# Patient Record
Sex: Male | Born: 1955 | Race: Black or African American | Hispanic: No | Marital: Married | Smoking: Current every day smoker
Health system: Southern US, Community
[De-identification: ages and names within clinical notes are randomized; demographics above are authoritative.]

## PROBLEM LIST (undated history)

## (undated) DIAGNOSIS — IMO0001 Reserved for inherently not codable concepts without codable children: Secondary | ICD-10-CM

## (undated) DIAGNOSIS — C259 Malignant neoplasm of pancreas, unspecified: Secondary | ICD-10-CM

## (undated) DIAGNOSIS — M109 Gout, unspecified: Secondary | ICD-10-CM

## (undated) DIAGNOSIS — M1990 Unspecified osteoarthritis, unspecified site: Secondary | ICD-10-CM

## (undated) DIAGNOSIS — Z8719 Personal history of other diseases of the digestive system: Secondary | ICD-10-CM

## (undated) DIAGNOSIS — I10 Essential (primary) hypertension: Secondary | ICD-10-CM

## (undated) DIAGNOSIS — I82890 Acute embolism and thrombosis of other specified veins: Secondary | ICD-10-CM

## (undated) DIAGNOSIS — Z5189 Encounter for other specified aftercare: Secondary | ICD-10-CM

## (undated) DIAGNOSIS — K635 Polyp of colon: Secondary | ICD-10-CM

## (undated) DIAGNOSIS — E785 Hyperlipidemia, unspecified: Secondary | ICD-10-CM

## (undated) DIAGNOSIS — K7689 Other specified diseases of liver: Secondary | ICD-10-CM

## (undated) DIAGNOSIS — K219 Gastro-esophageal reflux disease without esophagitis: Secondary | ICD-10-CM

## (undated) HISTORY — DX: Encounter for other specified aftercare: Z51.89

## (undated) HISTORY — DX: Personal history of other diseases of the digestive system: Z87.19

## (undated) HISTORY — PX: SHOULDER SURGERY: SHX246

## (undated) HISTORY — DX: Essential (primary) hypertension: I10

## (undated) HISTORY — DX: Acute embolism and thrombosis of other specified veins: I82.890

## (undated) HISTORY — DX: Polyp of colon: K63.5

## (undated) HISTORY — PX: HIP SURGERY: SHX245

## (undated) HISTORY — DX: Gastro-esophageal reflux disease without esophagitis: K21.9

## (undated) HISTORY — PX: HAND SURGERY: SHX662

## (undated) HISTORY — DX: Other specified diseases of liver: K76.89

## (undated) HISTORY — DX: Hyperlipidemia, unspecified: E78.5

## (undated) HISTORY — DX: Reserved for inherently not codable concepts without codable children: IMO0001

---

## 1998-12-12 ENCOUNTER — Encounter: Admission: RE | Admit: 1998-12-12 | Discharge: 1998-12-12 | Payer: Self-pay | Admitting: Sports Medicine

## 1999-03-07 ENCOUNTER — Encounter: Admission: RE | Admit: 1999-03-07 | Discharge: 1999-03-07 | Payer: Self-pay | Admitting: Family Medicine

## 1999-09-12 ENCOUNTER — Encounter: Admission: RE | Admit: 1999-09-12 | Discharge: 1999-09-12 | Payer: Self-pay | Admitting: Family Medicine

## 1999-11-16 ENCOUNTER — Encounter: Admission: RE | Admit: 1999-11-16 | Discharge: 1999-11-16 | Payer: Self-pay | Admitting: Family Medicine

## 2000-03-14 ENCOUNTER — Encounter: Admission: RE | Admit: 2000-03-14 | Discharge: 2000-03-14 | Payer: Self-pay | Admitting: Family Medicine

## 2000-07-15 ENCOUNTER — Encounter: Admission: RE | Admit: 2000-07-15 | Discharge: 2000-07-15 | Payer: Self-pay | Admitting: Family Medicine

## 2000-09-12 ENCOUNTER — Encounter: Admission: RE | Admit: 2000-09-12 | Discharge: 2000-09-12 | Payer: Self-pay | Admitting: Family Medicine

## 2001-04-09 ENCOUNTER — Encounter: Admission: RE | Admit: 2001-04-09 | Discharge: 2001-04-09 | Payer: Self-pay | Admitting: Family Medicine

## 2001-04-23 ENCOUNTER — Encounter: Admission: RE | Admit: 2001-04-23 | Discharge: 2001-04-23 | Payer: Self-pay | Admitting: Sports Medicine

## 2001-05-07 ENCOUNTER — Encounter: Admission: RE | Admit: 2001-05-07 | Discharge: 2001-05-07 | Payer: Self-pay | Admitting: Family Medicine

## 2001-06-22 ENCOUNTER — Encounter: Admission: RE | Admit: 2001-06-22 | Discharge: 2001-06-22 | Payer: Self-pay | Admitting: Family Medicine

## 2001-09-09 ENCOUNTER — Encounter: Admission: RE | Admit: 2001-09-09 | Discharge: 2001-09-09 | Payer: Self-pay | Admitting: Family Medicine

## 2001-09-18 ENCOUNTER — Encounter: Admission: RE | Admit: 2001-09-18 | Discharge: 2001-09-18 | Payer: Self-pay | Admitting: Family Medicine

## 2001-09-28 ENCOUNTER — Encounter: Admission: RE | Admit: 2001-09-28 | Discharge: 2001-09-28 | Payer: Self-pay | Admitting: Family Medicine

## 2002-02-17 ENCOUNTER — Encounter: Admission: RE | Admit: 2002-02-17 | Discharge: 2002-02-17 | Payer: Self-pay | Admitting: Family Medicine

## 2002-05-31 ENCOUNTER — Encounter: Admission: RE | Admit: 2002-05-31 | Discharge: 2002-05-31 | Payer: Self-pay | Admitting: Family Medicine

## 2002-07-09 ENCOUNTER — Encounter: Admission: RE | Admit: 2002-07-09 | Discharge: 2002-07-09 | Payer: Self-pay | Admitting: Sports Medicine

## 2002-07-09 ENCOUNTER — Encounter: Payer: Self-pay | Admitting: Sports Medicine

## 2002-11-29 ENCOUNTER — Encounter: Admission: RE | Admit: 2002-11-29 | Discharge: 2002-11-29 | Payer: Self-pay | Admitting: Family Medicine

## 2002-12-29 ENCOUNTER — Encounter: Admission: RE | Admit: 2002-12-29 | Discharge: 2002-12-29 | Payer: Self-pay | Admitting: Family Medicine

## 2003-03-17 ENCOUNTER — Encounter: Admission: RE | Admit: 2003-03-17 | Discharge: 2003-03-17 | Payer: Self-pay | Admitting: Sports Medicine

## 2003-04-13 ENCOUNTER — Encounter: Admission: RE | Admit: 2003-04-13 | Discharge: 2003-04-13 | Payer: Self-pay | Admitting: Family Medicine

## 2003-05-06 ENCOUNTER — Encounter: Admission: RE | Admit: 2003-05-06 | Discharge: 2003-05-06 | Payer: Self-pay | Admitting: Family Medicine

## 2003-11-28 ENCOUNTER — Ambulatory Visit: Payer: Self-pay | Admitting: Family Medicine

## 2004-11-07 ENCOUNTER — Ambulatory Visit: Payer: Self-pay | Admitting: Sports Medicine

## 2004-11-14 ENCOUNTER — Encounter: Admission: RE | Admit: 2004-11-14 | Discharge: 2004-11-14 | Payer: Self-pay | Admitting: Orthopedic Surgery

## 2005-02-04 ENCOUNTER — Encounter (INDEPENDENT_AMBULATORY_CARE_PROVIDER_SITE_OTHER): Payer: Self-pay | Admitting: Family Medicine

## 2005-02-04 LAB — CONVERTED CEMR LAB
HDL: 42 mg/dL
LDL Cholesterol: 63 mg/dL

## 2005-12-30 ENCOUNTER — Ambulatory Visit: Payer: Self-pay | Admitting: Sports Medicine

## 2005-12-31 LAB — CONVERTED CEMR LAB: LDL Cholesterol: 63 mg/dL

## 2006-02-13 ENCOUNTER — Ambulatory Visit: Payer: Self-pay | Admitting: Sports Medicine

## 2006-04-03 DIAGNOSIS — I7300 Raynaud's syndrome without gangrene: Secondary | ICD-10-CM

## 2006-04-03 DIAGNOSIS — K210 Gastro-esophageal reflux disease with esophagitis: Secondary | ICD-10-CM

## 2006-04-03 DIAGNOSIS — H109 Unspecified conjunctivitis: Secondary | ICD-10-CM | POA: Insufficient documentation

## 2006-04-03 DIAGNOSIS — M109 Gout, unspecified: Secondary | ICD-10-CM | POA: Insufficient documentation

## 2006-04-03 DIAGNOSIS — R42 Dizziness and giddiness: Secondary | ICD-10-CM | POA: Insufficient documentation

## 2006-04-03 DIAGNOSIS — I10 Essential (primary) hypertension: Secondary | ICD-10-CM | POA: Insufficient documentation

## 2006-09-30 ENCOUNTER — Ambulatory Visit: Payer: Self-pay | Admitting: Family Medicine

## 2006-09-30 DIAGNOSIS — M6740 Ganglion, unspecified site: Secondary | ICD-10-CM

## 2006-09-30 DIAGNOSIS — L608 Other nail disorders: Secondary | ICD-10-CM

## 2006-12-02 ENCOUNTER — Ambulatory Visit: Payer: Self-pay | Admitting: Family Medicine

## 2006-12-26 ENCOUNTER — Encounter (INDEPENDENT_AMBULATORY_CARE_PROVIDER_SITE_OTHER): Payer: Self-pay | Admitting: *Deleted

## 2007-02-11 ENCOUNTER — Ambulatory Visit: Payer: Self-pay | Admitting: Family Medicine

## 2007-02-11 ENCOUNTER — Encounter (INDEPENDENT_AMBULATORY_CARE_PROVIDER_SITE_OTHER): Payer: Self-pay | Admitting: Family Medicine

## 2007-02-11 LAB — CONVERTED CEMR LAB
BUN: 11 mg/dL (ref 6–23)
Chloride: 103 meq/L (ref 96–112)
Potassium: 4.5 meq/L (ref 3.5–5.3)
Sodium: 141 meq/L (ref 135–145)

## 2007-02-12 ENCOUNTER — Encounter (INDEPENDENT_AMBULATORY_CARE_PROVIDER_SITE_OTHER): Payer: Self-pay | Admitting: Family Medicine

## 2007-11-09 ENCOUNTER — Encounter (INDEPENDENT_AMBULATORY_CARE_PROVIDER_SITE_OTHER): Payer: Self-pay | Admitting: Family Medicine

## 2007-11-11 ENCOUNTER — Ambulatory Visit: Payer: Self-pay | Admitting: Family Medicine

## 2007-11-11 ENCOUNTER — Encounter (INDEPENDENT_AMBULATORY_CARE_PROVIDER_SITE_OTHER): Payer: Self-pay | Admitting: Family Medicine

## 2007-11-11 DIAGNOSIS — M2500 Hemarthrosis, unspecified joint: Secondary | ICD-10-CM | POA: Insufficient documentation

## 2007-11-11 LAB — CONVERTED CEMR LAB
ALT: 14 units/L (ref 0–53)
BUN: 13 mg/dL (ref 6–23)
CO2: 24 meq/L (ref 19–32)
Calcium: 9.7 mg/dL (ref 8.4–10.5)
Chloride: 102 meq/L (ref 96–112)
Creatinine, Ser: 1.1 mg/dL (ref 0.40–1.50)
Glucose, Bld: 78 mg/dL (ref 70–99)
HCT: 39.6 % (ref 39.0–52.0)
Hemoglobin: 13 g/dL (ref 13.0–17.0)
MCHC: 32.8 g/dL (ref 30.0–36.0)
MCV: 83.7 fL (ref 78.0–100.0)
Platelets: 258 10*3/uL (ref 150–400)
RDW: 13.9 % (ref 11.5–15.5)
Total Bilirubin: 0.8 mg/dL (ref 0.3–1.2)
aPTT: 31 s

## 2007-11-18 ENCOUNTER — Encounter (INDEPENDENT_AMBULATORY_CARE_PROVIDER_SITE_OTHER): Payer: Self-pay | Admitting: Family Medicine

## 2007-11-18 LAB — CONVERTED CEMR LAB
Crystals, Fluid: NONE SEEN
WBC, Synovial: 2565 — ABNORMAL HIGH (ref 0–200)

## 2008-01-13 ENCOUNTER — Telehealth: Payer: Self-pay | Admitting: *Deleted

## 2008-02-12 ENCOUNTER — Inpatient Hospital Stay (HOSPITAL_COMMUNITY): Admission: EM | Admit: 2008-02-12 | Discharge: 2008-02-19 | Payer: Self-pay | Admitting: Emergency Medicine

## 2008-02-18 ENCOUNTER — Encounter: Payer: Self-pay | Admitting: *Deleted

## 2008-03-08 ENCOUNTER — Encounter: Admission: RE | Admit: 2008-03-08 | Discharge: 2008-03-08 | Payer: Self-pay | Admitting: Neurosurgery

## 2008-05-18 ENCOUNTER — Encounter: Admission: RE | Admit: 2008-05-18 | Discharge: 2008-06-14 | Payer: Self-pay | Admitting: Orthopedic Surgery

## 2008-06-08 ENCOUNTER — Encounter: Admission: RE | Admit: 2008-06-08 | Discharge: 2008-06-08 | Payer: Self-pay | Admitting: Orthopedic Surgery

## 2008-06-20 ENCOUNTER — Ambulatory Visit (HOSPITAL_COMMUNITY): Admission: RE | Admit: 2008-06-20 | Discharge: 2008-06-20 | Payer: Self-pay | Admitting: Orthopedic Surgery

## 2008-06-20 LAB — CONVERTED CEMR LAB
Creatinine, Ser: 1.05 mg/dL
Sodium: 138 meq/L

## 2008-07-05 ENCOUNTER — Ambulatory Visit: Payer: Self-pay | Admitting: Family Medicine

## 2008-07-05 DIAGNOSIS — D649 Anemia, unspecified: Secondary | ICD-10-CM | POA: Insufficient documentation

## 2008-07-05 DIAGNOSIS — S72009A Fracture of unspecified part of neck of unspecified femur, initial encounter for closed fracture: Secondary | ICD-10-CM | POA: Insufficient documentation

## 2008-07-05 LAB — CONVERTED CEMR LAB: Hemoglobin: 14.7 g/dL

## 2008-12-27 ENCOUNTER — Telehealth: Payer: Self-pay | Admitting: *Deleted

## 2009-01-23 ENCOUNTER — Ambulatory Visit: Payer: Self-pay | Admitting: Family Medicine

## 2009-01-23 ENCOUNTER — Encounter: Payer: Self-pay | Admitting: Family Medicine

## 2009-01-27 LAB — CONVERTED CEMR LAB
Albumin: 4.7 g/dL (ref 3.5–5.2)
BUN: 9 mg/dL (ref 6–23)
CO2: 25 meq/L (ref 19–32)
Cholesterol: 137 mg/dL (ref 0–200)
Glucose, Bld: 74 mg/dL (ref 70–99)
HDL: 56 mg/dL (ref >39)
Sodium: 144 meq/L (ref 135–145)
Total Bilirubin: 0.5 mg/dL (ref 0.3–1.2)
Total Protein: 7.7 g/dL (ref 6.0–8.3)
Triglycerides: 56 mg/dL (ref <150)
VLDL: 11 mg/dL (ref 0–40)

## 2009-01-31 ENCOUNTER — Encounter: Payer: Self-pay | Admitting: Family Medicine

## 2009-01-31 ENCOUNTER — Ambulatory Visit: Payer: Self-pay | Admitting: Family Medicine

## 2009-01-31 DIAGNOSIS — J069 Acute upper respiratory infection, unspecified: Secondary | ICD-10-CM | POA: Insufficient documentation

## 2009-02-04 HISTORY — PX: FEMUR HARDWARE REMOVAL: SUR1124

## 2009-02-17 ENCOUNTER — Ambulatory Visit: Payer: Self-pay | Admitting: Family Medicine

## 2009-02-17 ENCOUNTER — Encounter: Admission: RE | Admit: 2009-02-17 | Discharge: 2009-02-17 | Payer: Self-pay | Admitting: Family Medicine

## 2009-02-17 ENCOUNTER — Encounter: Payer: Self-pay | Admitting: Family Medicine

## 2009-03-23 ENCOUNTER — Telehealth: Payer: Self-pay | Admitting: Family Medicine

## 2009-04-26 ENCOUNTER — Encounter: Admission: RE | Admit: 2009-04-26 | Discharge: 2009-04-26 | Payer: Self-pay | Admitting: Orthopedic Surgery

## 2009-05-09 ENCOUNTER — Inpatient Hospital Stay (HOSPITAL_COMMUNITY): Admission: RE | Admit: 2009-05-09 | Discharge: 2009-05-12 | Payer: Self-pay | Admitting: Orthopedic Surgery

## 2009-08-21 ENCOUNTER — Encounter: Admission: RE | Admit: 2009-08-21 | Discharge: 2009-08-21 | Payer: Self-pay | Admitting: Orthopedic Surgery

## 2009-10-31 ENCOUNTER — Encounter
Admission: RE | Admit: 2009-10-31 | Discharge: 2010-01-29 | Payer: Self-pay | Source: Home / Self Care | Attending: Orthopedic Surgery | Admitting: Orthopedic Surgery

## 2010-01-02 ENCOUNTER — Encounter: Payer: Self-pay | Admitting: Family Medicine

## 2010-01-11 ENCOUNTER — Ambulatory Visit: Payer: Self-pay

## 2010-01-12 ENCOUNTER — Encounter (INDEPENDENT_AMBULATORY_CARE_PROVIDER_SITE_OTHER): Payer: Self-pay | Admitting: *Deleted

## 2010-01-18 ENCOUNTER — Ambulatory Visit: Payer: Self-pay | Admitting: Family Medicine

## 2010-01-19 ENCOUNTER — Ambulatory Visit: Payer: Self-pay | Admitting: Family Medicine

## 2010-01-19 ENCOUNTER — Encounter: Payer: Self-pay | Admitting: Family Medicine

## 2010-01-22 DIAGNOSIS — R7309 Other abnormal glucose: Secondary | ICD-10-CM | POA: Insufficient documentation

## 2010-01-30 ENCOUNTER — Encounter
Admission: RE | Admit: 2010-01-30 | Discharge: 2010-02-03 | Payer: Self-pay | Source: Home / Self Care | Attending: Orthopedic Surgery | Admitting: Orthopedic Surgery

## 2010-02-13 ENCOUNTER — Encounter
Admission: RE | Admit: 2010-02-13 | Discharge: 2010-03-06 | Payer: Self-pay | Source: Home / Self Care | Attending: Orthopedic Surgery | Admitting: Orthopedic Surgery

## 2010-02-13 ENCOUNTER — Encounter: Admit: 2010-02-13 | Payer: Self-pay | Admitting: Orthopedic Surgery

## 2010-02-16 ENCOUNTER — Telehealth: Payer: Self-pay | Admitting: Family Medicine

## 2010-02-16 LAB — CONVERTED CEMR LAB
BUN: 9 mg/dL (ref 6–23)
CO2: 27 meq/L (ref 19–32)
Calcium: 9.7 mg/dL (ref 8.4–10.5)
Chloride: 103 meq/L (ref 96–112)
Cholesterol: 129 mg/dL (ref 0–200)
Creatinine, Ser: 1.14 mg/dL (ref 0.40–1.50)
HDL: 35 mg/dL — ABNORMAL LOW (ref >39)
Total CHOL/HDL Ratio: 3.7

## 2010-02-24 ENCOUNTER — Encounter: Payer: Self-pay | Admitting: Orthopedic Surgery

## 2010-02-25 ENCOUNTER — Encounter: Payer: Self-pay | Admitting: Sports Medicine

## 2010-02-25 ENCOUNTER — Encounter: Payer: Self-pay | Admitting: Neurosurgery

## 2010-03-06 ENCOUNTER — Encounter: Admit: 2010-03-06 | Payer: Self-pay | Admitting: Orthopedic Surgery

## 2010-03-06 NOTE — Progress Notes (Signed)
Summary: Rx Req  Phone Note Refill Request Call back at Home Phone [phone] Message from:  Patient  Refills Requested: Medication #1:  NORVASC 10 MG TABS Take 1 tablet by mouth once a day [facility] [address]  Initial call taken by: Clydell Hakim,  March 23, 2009 8:46 AM  Follow-up for Phone Call        will forward to MD. Follow-up by: Theresia Lo RN,  March 23, 2009 11:05 AM    Prescriptions: NORVASC 10 MG TABS (AMLODIPINE BESYLATE) Take 1 tablet by mouth once a day  #30 Tablet x 11   Entered and Authorized by:   Bobby Rumpf  MD   Signed by:   Bobby Rumpf  MD on 03/23/2009   Method used:   [facility]        [facility]  [address] [id number]       [address]       [place], [place]  [address]       Ph: [phone] or [phone]       Fax: [phone]   RxID:   [id number]   Appended Document: Rx Req LV that rx was phoned in.

## 2010-03-06 NOTE — Miscellaneous (Signed)
Summary: fell  Clinical Lists Changes fell on ice & hit R hip this am. able to walk & drive but in a lot of pain. he went to [facility] & then decided to see if we can see him. told him to be here at 1:30 & we will get him seen asap. to ice area. has narcotic pain meds but states if h takes them he cannot function. On 02/12/08 , he had titanium rods placed in R hip. He did call his ortho md but he is unavailable. stated he will be here at 1:30.[place] Circle RN  February 17, 2009 11:50 AM  See clinic visit note. Bobby Rumpf  MD  February 17, 2009 1:47 PM

## 2010-03-06 NOTE — Assessment & Plan Note (Signed)
Summary: R hip pain from fall/[place]/Kollins Fenter   Vital Signs:  Patient profile:   55 year old male Weight:      215.1 pounds BMI:     29.69 Pulse rate:   92 / minute BP sitting:   133 / 88  (left arm) Cuff size:   large  Vitals Entered By: Garen Grams LPN (February 17, 2009 1:54 PM) CC: fell and injured Right hip Is Patient Diabetic? No Pain Assessment Patient in pain? yes     Location: right hip Intensity: 8   Primary Care Provider:  Bobby Rumpf  MD  CC:  fell and injured Right hip.  History of Present Illness: 1) Right hip injury: Patient slipped and fell on ice while at work on day of clinic visit. Landed on right hip. Now with right lateral hip pain, worse with hip flexion and with weight bearing. History is notable for RIGHT intertrochanteric femoral fracture s/p IM nail fixation and dynamic hip screw. Sees Dr. Carola Frost for orthopedics. Reports that he can feel a grinding sensation with hip flexion since his fall, also initially reported numbness at lateral upper leg, extending down to anterior lower leg and tops of toes, this is now resolved. Denies groin pain, anterior hip pain, back pain, fever, leg or hip swelling or bruising, urinary or bowel changes. Has not taken anything for pain. Has brought in a form for work as well regarding work duties. Walks with a limp at baseline, uses single prong cane only when he feels that he needs it.   Habits & Providers  Alcohol-Tobacco-Diet     Tobacco Status: never  Current Medications (verified): 1)  Allopurinol 300 Mg Tabs (Allopurinol) .... Take 1 Tablet By Mouth Once A Day 2)  Colchicine 0.6 Mg Tabs (Colchicine) .... Take 1 Tablet By Mouth Twice A Day As Needed For Flare 3)  Cozaar 100 Mg Tabs (Losartan Potassium) .... Take 1 Tablet By Mouth Once A Day 4)  Indomethacin 50 Mg Caps (Indomethacin) .Marland Kitchen.. 1 Capsule By Mouth Three Times A Day As Needed For Gout Flare 5)  Omeprazole 40 Mg Cpdr (Omeprazole) .... One By Mouth Daily 6)  Norvasc 10  Mg Tabs (Amlodipine Besylate) .... Take 1 Tablet By Mouth Once A Day 7)  Mens Multivitamin Plus  Tabs (Multiple Vitamins-Minerals) .... One Daily 8)  Cvs Vitamin D 1000 Unit Caps (Cholecalciferol) .... One Daily 9)  Calcium 500 Mg Chew (Calcium Carbonate) .... 2 Daily 10)  Vicodin 5-500 Mg Tabs (Hydrocodone-Acetaminophen) .... Three Times A Day As Needed (Per Dr Carola Frost) 11)  Anacin 81 Mg Tbec (Aspirin) .... One Daily  Allergies (verified): No Known Drug Allergies  Review of Systems       denies back pain, chest pain, change in mental status, dyspnea  Physical Exam  General:  pleasant AAM, using cane today, limp noticeable worse, appears uncomfortable with weight bearing and movement Head:  normocephalic and atraumatic.   Neck:  full rom, non tender c-spine Chest Wall:  non tender to palpation Lungs:  CTAB w/o wheeze or crackles  Heart:  Normal rate and regular rhythm. S1 and S2 normal without gallop, murmur, click, rub or other extra sounds. Abdomen:  soft, NT/ ND, +BS  Msk:  leg length discrepancy R < L 1 inch, does not correct with sitting up severe lateral pain with mild passive right hip flexion moderately tender to palpation over right greater trochanter. no bruising or swelling noted over right hip.  pain with weight bearing right hip  Pulses:  2+ pedal pulses bilaterally Extremities:  sensation intact bilateral lower extremities, no edema  Neurologic:  alert & oriented X3, cranial nerves II-XII intact, and strength normal in all extremities (but painful with ROM exercises in RIGHT hip), sensation intact  bilateral lower extremities Skin:  no bruising Additional Exam:  RIGHT HIP FILM SERIES : 1.  Nonunion of proximal right femoral fracture status post ORIF. 2.  Progressive loosening of hardware and distal interlocking screw fracture compatible with hardware failure.                 Impression & Recommendations:  Problem # 1:  HIP FRACTURE, RIGHT (ICD-820.8) Assessment  Deteriorated Films as above. Advised patient to follow up with his orthopedist (Dr. Carola Frost) on Monday. Question if this hardware issue is acute vs chronic.  Pain control with vicodin, RICE. Will have patient pick up his paperwork for his orthopedist to fill.  Orders: Diagnostic X-Ray/Fluoroscopy (Diagnostic X-Ray/Flu) [facility]- Est  Level 4 (81191)  Complete Medication List: 1)  Allopurinol 300 Mg Tabs (Allopurinol) .... Take 1 tablet by mouth once a day 2)  Colchicine 0.6 Mg Tabs (Colchicine) .... Take 1 tablet by mouth twice a day as needed for flare 3)  Cozaar 100 Mg Tabs (Losartan potassium) .... Take 1 tablet by mouth once a day 4)  Indomethacin 50 Mg Caps (Indomethacin) .Marland Kitchen.. 1 capsule by mouth three times a day as needed for gout flare 5)  Omeprazole 40 Mg Cpdr (Omeprazole) .... One by mouth daily 6)  Norvasc 10 Mg Tabs (Amlodipine besylate) .... Take 1 tablet by mouth once a day 7)  Mens Multivitamin Plus Tabs (Multiple vitamins-minerals) .... One daily 8)  Cvs Vitamin D 1000 Unit Caps (Cholecalciferol) .... One daily 9)  Calcium 500 Mg Chew (Calcium carbonate) .... 2 daily 10)  Vicodin 5-500 Mg Tabs (Hydrocodone-acetaminophen) .... Three times a day as needed (per dr handy) 11)  Anacin 81 Mg Tbec (Aspirin) .... One daily  Patient Instructions: 1)  Take your pain medication as directed.  2)  You can also take ibuprofen as needed for pain.  3)  We will check Xrays of your hip today.  4)  I will fill out the form for you after we see your xrays. Come in on Monday to pick it up.  5)  Follow up with your orthopedist next week if not improving.  6)  You can apply ice to your hip to help with pain and swelling.  7)  Try to rest your hip over the next few days.  Prescriptions: VICODIN 5-500 MG TABS (HYDROCODONE-ACETAMINOPHEN) three times a day as needed (per Dr Carola Frost)  #40 x 0   Entered and Authorized by:   Bobby Rumpf  MD   Signed by:   Bobby Rumpf  MD on 02/17/2009   Method used:   Print  then Give to Patient   RxID:   [id number]

## 2010-03-08 NOTE — Miscellaneous (Signed)
Summary: sent medical records   Clinical Lists Changes sent medical record to Margarette Asal Read Attorney at law, fronm 2009 to present [employer]

## 2010-03-08 NOTE — Miscellaneous (Signed)
Summary: sent medical record  Clinical Lists Changes Sent medical record to Reuel Boom Read from Jan 2009 to Jan 2011 [employer]

## 2010-03-08 NOTE — Assessment & Plan Note (Signed)
Summary: CPE/KH   FLU SHOT GIVEN TODAY.Ray [name], CMA  January 18, 2010 9:42 AM  Vital Signs:  Patient profile:   55 year old male Height:      71.5 inches Weight:      242 pounds BMI:     33.40 Temp:     98.8 degrees F oral Pulse rate:   90 / minute BP sitting:   141 / 88  (left arm) Cuff size:   large  Vitals Entered By: Tessie Fass CMA (January 18, 2010 9:09 AM) CC: CPE Is Patient Diabetic? No Pain Assessment Patient in pain? yes     Location: right hip and knee Intensity: 4   Primary Care Provider:  Bobby Rumpf  MD  CC:  CPE.  History of Present Illness: 1) Right hip injury: Last seen on 02/17/09 - patient slipped and fell on ice while at work on day of that clinic visit and landed on right hip - was found to have broken hardware from his prior RIGHT intertrochanteric femoral fracture s/p IM nail fixation and dynamic hip screw. Had repair in April w/  Dr. Carola Frost  (Orthopedics). Has been participating with physical therapy 2x week but has not been able to return to work. Is also pursuing a workman's compensation case at this time. Walks with a limp at baseline, uses single prong cane only when he feels that he needs it.   2) Obesity: Weight has gone up  ~ 30 lbs since last visit in January, patient states that this is secondary to increased "junk food" intake and lack of activity since his hip injury. Currently sedentary - states that his weight gain is a "shock" and he is going to start exercising today.   3) Gout - stable on allopurinol. has not had gout flare for at least two years. seafood triggers.   4) HTN - BP 141/88 today. Ran out of meds yesterday. Reports 120's / 80's at home. taking all medications w/o side effects. does not monitor sodium. no regular exercise but very active at work. denies chest pain, dyspnea, LE edema, vision change   5) reflux - stable on omperazole. usually able to avoid foods that trigger (spicy, greasy foods). denies hematemesis, chest  pain.   4) Prevention - Willing to have colonoscopy. Wants flu shot today.   Habits & Providers  Alcohol-Tobacco-Diet     Tobacco Status: quit  Current Medications (verified): 1)  Allopurinol 300 Mg Tabs (Allopurinol) .... Take 1 Tablet By Mouth Once A Day 2)  Colchicine 0.6 Mg Tabs (Colchicine) .... Take 1 Tablet By Mouth Twice A Day As Needed For Flare 3)  Cozaar 100 Mg Tabs (Losartan Potassium) .... Take 1 Tablet By Mouth Once A Day 4)  Indomethacin 50 Mg Caps (Indomethacin) .Marland Kitchen.. 1 Capsule By Mouth Three Times A Day As Needed For Gout Flare 5)  Omeprazole 40 Mg Cpdr (Omeprazole) .... One By Mouth Daily 6)  Norvasc 10 Mg Tabs (Amlodipine Besylate) .... Take 1 Tablet By Mouth Once A Day 7)  Mens Multivitamin Plus  Tabs (Multiple Vitamins-Minerals) .... One Daily 8)  Cvs Vitamin D 1000 Unit Caps (Cholecalciferol) .... One Daily 9)  Calcium 500 Mg Chew (Calcium Carbonate) .... 2 Daily 10)  Vicodin 5-500 Mg Tabs (Hydrocodone-Acetaminophen) .... Three Times A Day As Needed (Per Dr Carola Frost) 11)  Anacin 81 Mg Tbec (Aspirin) .... One Daily  Allergies (verified): No Known Drug Allergies  Social History: Smoking Status:  quit  Physical Exam  General:  pleasant AAM, using cane today, NAD vitals reviewed Lungs:  CTAB w/o wheeze or crackles  Heart:  Normal rate and regular rhythm. S1 and S2 normal without gallop, murmur, click, rub or other extra sounds. Abdomen:  soft, NT/ ND, +BS  Msk:  moderate lateral hip pain at extremes of passive right hip flexion and internal / external rotation  no bruising or swelling noted over right hip.     Extremities:  sensation intact bilateral lower extremities, no edema  Neurologic:  alert & oriented X3, cranial nerves II-XII intact, and strength normal in all extremities (but painful with ROM exercises in RIGHT hip), sensation intact  bilateral lower extremities   Impression & Recommendations:  Problem # 1:  HIP FRACTURE, RIGHT  (ICD-820.8) Assessment Unchanged s/p repeat repair. Continue with PT. OK to use exercise bike to help with weight loss goals (weight loss would likely help his rehab as well). Pain control as per orthopedics.   Problem # 2:  GOUT, ACUTE (ICD-274.0) Assessment: Unchanged  Future Orders: Uric Acid-[facility] ([id number]) ... 01/17/2011  Stable. Continue allopurinol daily. Would prescribe colchicine as needed for flares.  His updated medication list for this problem includes:    Allopurinol 300 Mg Tabs (Allopurinol) .Marland Kitchen... Take 1 tablet by mouth once a day    Colchicine 0.6 Mg Tabs (Colchicine) .Marland Kitchen... Take 1 tablet by mouth twice a day as needed for flare    Indomethacin 50 Mg Caps (Indomethacin) .Marland Kitchen... 1 capsule by mouth three times a day as needed for gout flare  Problem # 3:  HEALTH MAINTENANCE EXAM (ICD-V70.0)  Colonoscopy referral, flu vaccine today.   Orders: [facility] - Est  40-64 yrs ([address]78)  Problem # 4:  HYPERTENSION, BENIGN SYSTEMIC (ICD-401.1) At goal. DASH diet, weight loss, exercise reviewed.   His updated medication list for this problem includes:    Cozaar 100 Mg Tabs (Losartan potassium) .Marland Kitchen... Take 1 tablet by mouth once a day    Norvasc 10 Mg Tabs (Amlodipine besylate) .Marland Kitchen... Take 1 tablet by mouth once a day  Future Orders: Comp Met-[facility] ([id number]) ... 01/17/2011 Lipid-[facility] ([id number]) ... 01/17/2011  BP today: 141/88 Prior BP: 133/88 (02/17/2009)  Labs Reviewed: K+: 3.9 (01/23/2009) Creat: : 1.09 (01/23/2009)   Chol: 137 (01/23/2009)   HDL: 56 (01/23/2009)   LDL: 70 (01/23/2009)   TG: 56 (01/23/2009)  Complete Medication List: 1)  Allopurinol 300 Mg Tabs (Allopurinol) .... Take 1 tablet by mouth once a day 2)  Colchicine 0.6 Mg Tabs (Colchicine) .... Take 1 tablet by mouth twice a day as needed for flare 3)  Cozaar 100 Mg Tabs (Losartan potassium) .... Take 1 tablet by mouth once a day 4)  Indomethacin 50 Mg Caps (Indomethacin) .Marland Kitchen.. 1 capsule by mouth three times a  day as needed for gout flare 5)  Omeprazole 40 Mg Cpdr (Omeprazole) .... One by mouth daily 6)  Norvasc 10 Mg Tabs (Amlodipine besylate) .... Take 1 tablet by mouth once a day 7)  Mens Multivitamin Plus Tabs (Multiple vitamins-minerals) .... One daily 8)  Cvs Vitamin D 1000 Unit Caps (Cholecalciferol) .... One daily 9)  Calcium 500 Mg Chew (Calcium carbonate) .... 2 daily 10)  Vicodin 5-500 Mg Tabs (Hydrocodone-acetaminophen) .... Three times a day as needed (per dr handy) 11)  Anacin 81 Mg Tbec (Aspirin) .... One daily  Other Orders: Flu Vaccine 26yrs + ([address]) Admin 1st Vaccine (44034)  Patient Instructions: 1)  It was great to see you today!  2)  Try to get 30  minutes on stationary bike 4-5 times per week 3)  Avoid junk food 4)  Follow up with me in 5-6 months. 5)  Get your colonoscopy scheduled.  Prescriptions: ANACIN 81 MG TBEC (ASPIRIN) one daily  #34 x 11   Entered and Authorized by:   Bobby Rumpf  MD   Signed by:   Bobby Rumpf  MD on 01/18/2010   Method used:   [facility]  [address] [id number]       [address]       [place], [place]  [address]       Ph: [phone] or [phone]       Fax: [phone]   RxID:   [id number] NORVASC 10 MG TABS (AMLODIPINE BESYLATE) Take 1 tablet by mouth once a day  #30 Tablet x 11   Entered and Authorized by:   Bobby Rumpf  MD   Signed by:   Bobby Rumpf  MD on 01/18/2010   Method used:   [facility]        [facility]  [address] [id number]       [address]       [place], [place]  [address]       Ph: [phone] or [phone]       Fax: [phone]   RxID:   [id number] OMEPRAZOLE 40 MG CPDR (OMEPRAZOLE) one by mouth daily  #30 x 11   Entered and Authorized by:   Bobby Rumpf  MD   Signed by:   Bobby Rumpf  MD on 01/18/2010   Method used:   [facility]        [facility]  [address] [id number]       [address]       [place], [place]  [address]       Ph: [phone] or [phone]       Fax: [phone]   RxID:   [id number] COZAAR 100 MG TABS (LOSARTAN POTASSIUM) Take 1 tablet by mouth once a day  #34 x 11   Entered and Authorized by:   Bobby Rumpf  MD   Signed by:   Bobby Rumpf  MD on 01/18/2010   Method used:   [facility]        [facility]  [address] [id number]       [address]       [place], [place]  [address]       Ph: [phone] or [phone]       Fax: [phone]   RxID:   [id number] COLCHICINE 0.6 MG TABS (COLCHICINE) Take 1 tablet by mouth twice a day as needed for flare  #30 x 3   Entered and Authorized by:   Bobby Rumpf  MD   Signed by:   Bobby Rumpf  MD on 01/18/2010   Method used:   [facility]        [facility]  [address] [id number]       [address]       [place], [place]  [address]       Ph: [phone] or [phone]       Fax: [phone]   RxID:   [id number] ALLOPURINOL 300 MG TABS (ALLOPURINOL) Take 1  tablet by mouth once a day  #34 x 11   Entered and Authorized by:   Bobby Rumpf  MD   Signed by:   Bobby Rumpf  MD on 01/18/2010   Method used:   [facility]        [facility]  [address] [id number]       [address]       [place], [place]  [address]       Ph: [phone] or [phone]       Fax: [phone]   RxID:   [id number]    Orders Added: 1)  Comp Met-[facility] [[id number]] 2)  Lipid-[facility] [80061-22930] 3)  Uric Acid-[facility] [32951-88416] 4)  Flu Vaccine 65yrs + [90658] 5)  Admin 1st Vaccine [90471] 6)  [facility] - Est  40-64 yrs [60630]   Immunizations Administered:  Influenza Vaccine # 1:    Vaccine Type: Fluvax 3+    Site: right deltoid    Mfr: GlaxoSmithKline    Dose: 0.5 ml    Route: IM    Given by: Ray [name], CMA    Exp. Date: 08/04/2010    Lot #: [id number]    VIS given: 08/29/09 version given January 18, 2010.  Flu Vaccine Consent  Questions:    Do you have a history of severe allergic reactions to this vaccine? no    Any prior history of allergic reactions to egg and/or gelatin? no    Do you have a sensitivity to the preservative Thimersol? no    Do you have a past history of Guillan-Barre Syndrome? no    Do you currently have an acute febrile illness? no    Have you ever had a severe reaction to latex? no    Vaccine information given and explained to patient? yes   Immunizations Administered:  Influenza Vaccine # 1:    Vaccine Type: Fluvax 3+    Site: right deltoid    Mfr: GlaxoSmithKline    Dose: 0.5 ml    Route: IM    Given by: Ray [name], CMA    Exp. Date: 08/04/2010    Lot #: [id number]    VIS given: 08/29/09 version given January 18, 2010.

## 2010-03-08 NOTE — Progress Notes (Signed)
Summary: Called to discuss labs   Phone Note Outgoing Call Call back at [facility] Phone [phone]   Summary of Call: Called patient to discuss labs - left message for patient to call back -  Initial call taken by: Bobby Rumpf  MD,  February 16, 2010 11:17 AM  Follow-up for Phone Call        Called to let know that fasting sugar was high - he admitted that he had a cup of coffee with sugar just before coming in for labwork. Will try to add A1C on - if unable to, will have him come in to check A1C Follow-up by: Bobby Rumpf  MD,  February 16, 2010 11:26 AM

## 2010-04-25 LAB — CBC
HCT: 22.9 % — ABNORMAL LOW (ref 39.0–52.0)
HCT: 25.9 % — ABNORMAL LOW (ref 39.0–52.0)
HCT: 41.5 % (ref 39.0–52.0)
Hemoglobin: 10.6 g/dL — ABNORMAL LOW (ref 13.0–17.0)
Hemoglobin: 7.5 g/dL — ABNORMAL LOW (ref 13.0–17.0)
Hemoglobin: 8.6 g/dL — ABNORMAL LOW (ref 13.0–17.0)
MCHC: 33 g/dL (ref 30.0–36.0)
MCHC: 33.1 g/dL (ref 30.0–36.0)
MCV: 87.7 fL (ref 78.0–100.0)
MCV: 88.1 fL (ref 78.0–100.0)
Platelets: 223 10*3/uL (ref 150–400)
RBC: 2.58 MIL/uL — ABNORMAL LOW (ref 4.22–5.81)
RBC: 2.95 MIL/uL — ABNORMAL LOW (ref 4.22–5.81)
RBC: 3.65 MIL/uL — ABNORMAL LOW (ref 4.22–5.81)
RDW: 14.2 % (ref 11.5–15.5)
RDW: 14.2 % (ref 11.5–15.5)
RDW: 14.3 % (ref 11.5–15.5)
WBC: 11.8 10*3/uL — ABNORMAL HIGH (ref 4.0–10.5)
WBC: 13 10*3/uL — ABNORMAL HIGH (ref 4.0–10.5)

## 2010-04-25 LAB — BASIC METABOLIC PANEL
CO2: 28 mEq/L (ref 19–32)
CO2: 29 mEq/L (ref 19–32)
Calcium: 7.9 mg/dL — ABNORMAL LOW (ref 8.4–10.5)
Calcium: 8 mg/dL — ABNORMAL LOW (ref 8.4–10.5)
Calcium: 8.2 mg/dL — ABNORMAL LOW (ref 8.4–10.5)
Chloride: 102 mEq/L (ref 96–112)
Creatinine, Ser: 1.08 mg/dL (ref 0.4–1.5)
Creatinine, Ser: 1.09 mg/dL (ref 0.4–1.5)
GFR calc Af Amer: >60 mL/min (ref >60)
GFR calc Af Amer: >60 mL/min (ref >60)
GFR calc Af Amer: >60 mL/min (ref >60)
GFR calc non Af Amer: >60 mL/min (ref >60)
GFR calc non Af Amer: >60 mL/min (ref >60)
Glucose, Bld: 120 mg/dL — ABNORMAL HIGH (ref 70–99)
Glucose, Bld: 135 mg/dL — ABNORMAL HIGH (ref 70–99)
Potassium: 3.8 mEq/L (ref 3.5–5.1)
Sodium: 137 mEq/L (ref 135–145)

## 2010-04-25 LAB — WOUND CULTURE: Culture: NO GROWTH

## 2010-04-25 LAB — URINALYSIS, ROUTINE W REFLEX MICROSCOPIC
Glucose, UA: NEGATIVE mg/dL
Nitrite: NEGATIVE
Protein, ur: NEGATIVE mg/dL
Urobilinogen, UA: 0.2 mg/dL (ref 0.0–1.0)

## 2010-04-25 LAB — COMPREHENSIVE METABOLIC PANEL
Albumin: 4.3 g/dL (ref 3.5–5.2)
Alkaline Phosphatase: 96 U/L (ref 39–117)
BUN: 8 mg/dL (ref 6–23)
Potassium: 3.7 mEq/L (ref 3.5–5.1)
Sodium: 140 mEq/L (ref 135–145)
Total Protein: 8.2 g/dL (ref 6.0–8.3)

## 2010-04-25 LAB — DIFFERENTIAL
Eosinophils Absolute: 0.1 10*3/uL (ref 0.0–0.7)
Lymphs Abs: 1.8 10*3/uL (ref 0.7–4.0)
Monocytes Relative: 6 % (ref 3–12)
Neutrophils Relative %: 62 % (ref 43–77)

## 2010-04-25 LAB — PROTIME-INR
INR: 1.05 (ref 0.00–1.49)
Prothrombin Time: 13.6 seconds (ref 11.6–15.2)

## 2010-04-25 LAB — TYPE AND SCREEN

## 2010-04-25 LAB — ANAEROBIC CULTURE

## 2010-04-25 LAB — APTT: aPTT: 27 seconds (ref 24–37)

## 2010-04-25 LAB — GRAM STAIN

## 2010-05-15 LAB — COMPREHENSIVE METABOLIC PANEL
Albumin: 4.3 g/dL (ref 3.5–5.2)
BUN: 7 mg/dL (ref 6–23)
Calcium: 9.5 mg/dL (ref 8.4–10.5)
Chloride: 102 mEq/L (ref 96–112)
Creatinine, Ser: 1.05 mg/dL (ref 0.4–1.5)
GFR calc non Af Amer: >60 mL/min (ref >60)
Total Bilirubin: 0.5 mg/dL (ref 0.3–1.2)

## 2010-05-15 LAB — CBC
HCT: 42.5 % (ref 39.0–52.0)
MCHC: 33.3 g/dL (ref 30.0–36.0)
MCV: 87.1 fL (ref 78.0–100.0)
Platelets: 215 10*3/uL (ref 150–400)
WBC: 5 10*3/uL (ref 4.0–10.5)

## 2010-05-21 LAB — CBC
HCT: 24 % — ABNORMAL LOW (ref 39.0–52.0)
HCT: 28.4 % — ABNORMAL LOW (ref 39.0–52.0)
HCT: 30.6 % — ABNORMAL LOW (ref 39.0–52.0)
Hemoglobin: 13.5 g/dL (ref 13.0–17.0)
Hemoglobin: 7.8 g/dL — CL (ref 13.0–17.0)
Hemoglobin: 8 g/dL — ABNORMAL LOW (ref 13.0–17.0)
Hemoglobin: 9.2 g/dL — ABNORMAL LOW (ref 13.0–17.0)
MCHC: 32.3 g/dL (ref 30.0–36.0)
MCHC: 33.3 g/dL (ref 30.0–36.0)
MCHC: 33.4 g/dL (ref 30.0–36.0)
MCHC: 33.5 g/dL (ref 30.0–36.0)
MCHC: 34.4 g/dL (ref 30.0–36.0)
MCV: 86.3 fL (ref 78.0–100.0)
MCV: 86.9 fL (ref 78.0–100.0)
Platelets: 151 10*3/uL (ref 150–400)
Platelets: 183 10*3/uL (ref 150–400)
Platelets: 218 10*3/uL (ref 150–400)
Platelets: 231 10*3/uL (ref 150–400)
RBC: 2.63 MIL/uL — ABNORMAL LOW (ref 4.22–5.81)
RBC: 4.82 MIL/uL (ref 4.22–5.81)
RDW: 13.8 % (ref 11.5–15.5)
RDW: 14.2 % (ref 11.5–15.5)
RDW: 14.3 % (ref 11.5–15.5)
RDW: 14.9 % (ref 11.5–15.5)
WBC: 13 10*3/uL — ABNORMAL HIGH (ref 4.0–10.5)
WBC: 13.6 10*3/uL — ABNORMAL HIGH (ref 4.0–10.5)

## 2010-05-21 LAB — URINALYSIS, ROUTINE W REFLEX MICROSCOPIC
Leukocytes, UA: NEGATIVE
Protein, ur: 30 mg/dL — AB
Urobilinogen, UA: 1 mg/dL (ref 0.0–1.0)

## 2010-05-21 LAB — CROSSMATCH: ABO/RH(D): B POS

## 2010-05-21 LAB — COMPREHENSIVE METABOLIC PANEL
ALT: 40 U/L (ref 0–53)
Alkaline Phosphatase: 78 U/L (ref 39–117)
CO2: 23 mEq/L (ref 19–32)
GFR calc non Af Amer: >60 mL/min (ref >60)
Glucose, Bld: 166 mg/dL — ABNORMAL HIGH (ref 70–99)
Potassium: 3.7 mEq/L (ref 3.5–5.1)
Sodium: 138 mEq/L (ref 135–145)

## 2010-05-21 LAB — BASIC METABOLIC PANEL
BUN: 10 mg/dL (ref 6–23)
BUN: 12 mg/dL (ref 6–23)
BUN: 12 mg/dL (ref 6–23)
CO2: 28 mEq/L (ref 19–32)
CO2: 28 mEq/L (ref 19–32)
Calcium: 7.4 mg/dL — ABNORMAL LOW (ref 8.4–10.5)
Chloride: 99 mEq/L (ref 96–112)
Chloride: 99 mEq/L (ref 96–112)
Creatinine, Ser: 1.1 mg/dL (ref 0.4–1.5)
GFR calc non Af Amer: >60 mL/min (ref >60)
Glucose, Bld: 120 mg/dL — ABNORMAL HIGH (ref 70–99)
Glucose, Bld: 143 mg/dL — ABNORMAL HIGH (ref 70–99)
Glucose, Bld: 180 mg/dL — ABNORMAL HIGH (ref 70–99)
Potassium: 4.5 mEq/L (ref 3.5–5.1)

## 2010-05-21 LAB — URINE MICROSCOPIC-ADD ON

## 2010-05-21 LAB — DIFFERENTIAL
Basophils Relative: 0 % (ref 0–1)
Eosinophils Absolute: 0.1 10*3/uL (ref 0.0–0.7)
Neutrophils Relative %: 68 % (ref 43–77)

## 2010-05-21 LAB — HEMOGLOBIN AND HEMATOCRIT, BLOOD: HCT: 30.6 % — ABNORMAL LOW (ref 39.0–52.0)
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
## 2010-06-28 ENCOUNTER — Telehealth: Payer: Self-pay | Admitting: Family Medicine

## 2010-06-28 NOTE — Telephone Encounter (Signed)
Dr. Wallene Huh sent back 1-2 days ago.  Pt informed and grateful Fleeger, Maryjo Rochester

## 2010-06-28 NOTE — Telephone Encounter (Signed)
Pt asking to speak with RN, says [facility] should be faxing [place] requesting some info b/c he is switching from [facility] to [facility] mail order [facility].

## 2010-09-24 ENCOUNTER — Ambulatory Visit: Payer: Self-pay | Admitting: Family Medicine

## 2010-10-12 ENCOUNTER — Ambulatory Visit (INDEPENDENT_AMBULATORY_CARE_PROVIDER_SITE_OTHER): Admitting: Family Medicine

## 2010-10-12 VITALS — BP 136/82 | HR 86 | Temp 98.5°F | Wt 227.0 lb

## 2010-10-12 DIAGNOSIS — M79609 Pain in unspecified limb: Secondary | ICD-10-CM

## 2010-10-12 DIAGNOSIS — M79604 Pain in right leg: Secondary | ICD-10-CM

## 2010-10-12 NOTE — Patient Instructions (Signed)
It was great seeing you today.  I would like for you to return in December for your yearly physical.  You can continue to use the ibuprofen as needed for your pain.

## 2010-10-21 DIAGNOSIS — M79604 Pain in right leg: Secondary | ICD-10-CM | POA: Insufficient documentation
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
## 2010-10-21 NOTE — Assessment & Plan Note (Signed)
Pain from recurrent fractures and likely arthritis.  He does have abnormal gait and pain with walking long distances.  He can continue using ibuprofen for pain.  Handicap placard application form filled out.

## 2011-01-21 ENCOUNTER — Encounter: Payer: Self-pay | Admitting: Family Medicine

## 2011-01-21 ENCOUNTER — Ambulatory Visit (INDEPENDENT_AMBULATORY_CARE_PROVIDER_SITE_OTHER): Admitting: Family Medicine

## 2011-01-21 VITALS — BP 146/86 | HR 83 | Temp 98.8°F | Ht 71.5 in | Wt 229.0 lb

## 2011-01-21 DIAGNOSIS — Z23 Encounter for immunization: Secondary | ICD-10-CM

## 2011-01-21 DIAGNOSIS — I10 Essential (primary) hypertension: Secondary | ICD-10-CM

## 2011-01-21 DIAGNOSIS — E663 Overweight: Secondary | ICD-10-CM | POA: Insufficient documentation

## 2011-01-21 LAB — COMPREHENSIVE METABOLIC PANEL
ALT: 20 U/L (ref 0–53)
AST: 18 U/L (ref 0–37)
Alkaline Phosphatase: 90 U/L (ref 39–117)
BUN: 12 mg/dL (ref 6–23)
Chloride: 104 mEq/L (ref 96–112)
Creat: 1.09 mg/dL (ref 0.50–1.35)
Total Bilirubin: 0.4 mg/dL (ref 0.3–1.2)

## 2011-01-21 LAB — CBC
HCT: 46.1 % (ref 39.0–52.0)
Hemoglobin: 15.5 g/dL (ref 13.0–17.0)
MCH: 28.9 pg (ref 26.0–34.0)
MCHC: 33.6 g/dL (ref 30.0–36.0)
MCV: 86 fL (ref 78.0–100.0)
Platelets: 219 10*3/uL (ref 150–400)
RBC: 5.36 MIL/uL (ref 4.22–5.81)
RDW: 14.4 % (ref 11.5–15.5)
WBC: 7.5 10*3/uL (ref 4.0–10.5)

## 2011-01-21 LAB — LIPID PANEL
LDL Cholesterol: 73 mg/dL (ref 0–99)
VLDL: 17 mg/dL (ref 0–40)

## 2011-01-21 NOTE — Patient Instructions (Signed)
It was good seeing you today. I will let you know the results of your tests when they return.   For your other issue we will see what the labs show, we can possibly change up your medications to see if that helps improve things if your labs are normal Please return in 6 months or as needed

## 2011-01-30 MED ORDER — LOSARTAN POTASSIUM 100 MG PO TABS: 100.0000 mg | ORAL_TABLET | Freq: Every day | ORAL | Status: DC

## 2011-01-30 MED ORDER — ALLOPURINOL 300 MG PO TABS: 300.0000 mg | ORAL_TABLET | Freq: Every day | ORAL | Status: DC

## 2011-01-30 MED ORDER — AMLODIPINE BESYLATE 10 MG PO TABS: 10.0000 mg | ORAL_TABLET | Freq: Every day | ORAL | Status: DC

## 2011-01-30 MED ORDER — OMEPRAZOLE 40 MG PO CPDR: 40.0000 mg | DELAYED_RELEASE_CAPSULE | Freq: Every day | ORAL | Status: DC

## 2011-01-30 NOTE — Progress Notes (Signed)
Patient ID: Ray [name], male   DOB: 1955-12-22, 55 y.o.   MRN: [id number] SUBJECTIVE:  Ray [name] is a 55 y.o. male presenting for his annual checkup.  Concerns today include difficulty with erections at times.  Current Outpatient Prescriptions  Medication Sig Dispense Refill  . allopurinol (ZYLOPRIM) 300 MG tablet Take 1 tablet (300 mg total) by mouth daily.  90 tablet  1  . amLODipine (NORVASC) 10 MG tablet Take 1 tablet (10 mg total) by mouth daily.  90 tablet  1  . aspirin (ANACIN) 81 MG EC tablet Take 81 mg by mouth daily.        . Calcium 500 MG CHEW Chew by mouth. 2 daily.       . Cholecalciferol (VITAMIN D) 1000 UNIT capsule Take 1,000 Units by mouth daily.        . colchicine 0.6 MG tablet Take 0.6 mg by mouth 2 (two) times daily as needed.        Marland Kitchen losartan (COZAAR) 100 MG tablet Take 1 tablet (100 mg total) by mouth daily.  90 tablet  1  . Multiple Vitamins-Minerals (MENS MULTIVITAMIN PLUS PO) Take by mouth daily.        Marland Kitchen omeprazole (PRILOSEC) 40 MG capsule Take 1 capsule (40 mg total) by mouth daily.  90 capsule  1  . DISCONTD: allopurinol (ZYLOPRIM) 300 MG tablet Take 300 mg by mouth daily.        Marland Kitchen DISCONTD: amLODipine (NORVASC) 10 MG tablet Take 10 mg by mouth daily.        Marland Kitchen DISCONTD: losartan (COZAAR) 100 MG tablet Take 100 mg by mouth daily.        Marland Kitchen DISCONTD: omeprazole (PRILOSEC) 40 MG capsule Take 40 mg by mouth daily.         Allergies: Review of patient's allergies indicates no known allergies.   ROS:  Feeling well. No dyspnea or chest pain on exertion. No abdominal pain, change in bowel habits, black or bloody stools. No urinary tract or prostatic symptoms. No neurological complaints.  OBJECTIVE:  The patient appears well, alert, oriented x 3, in no distress.  BP 146/86  Pulse 83  Temp(Src) 98.8 F (37.1 C) (Oral)  Ht 5' 11.5" (1.816 m)  Wt 229 lb (103.874 kg)  BMI 31.49 kg/m2 HEENT: normal.  Neck supple. No adenopathy or thyromegaly. PERLA.    Pulm: Lungs are clear, good air entry, no wheezes, rhonchi or rales.  CV: S1 and S2 normal, no murmurs, regular rate and rhythm.  Abdomen: soft without tenderness, guarding, mass or organomegaly.  GU exam:Prostate non-tender, of normal size, without palpable mass  Extremities: show no edema, normal peripheral pulses.  Neurological: is normal without focal findings.  ASSESSMENT:  healthy adult male  PLAN:  continue current medications, continue current healthy lifestyle patterns and return for routine annual checkups, labs per orders Will consider changing Bp medication to see if this helps improve symptoms, no hx of diabetes

## 2011-02-20 ENCOUNTER — Encounter: Payer: Self-pay | Admitting: Family Medicine

## 2011-03-12 ENCOUNTER — Encounter: Payer: Self-pay | Admitting: Gastroenterology

## 2011-03-12 ENCOUNTER — Telehealth: Payer: Self-pay | Admitting: Family Medicine

## 2011-03-12 NOTE — Telephone Encounter (Signed)
Called patient and gave him information to call and schedule his colonoscopy with [facility], his insurance does not require a referral.Busick, Rodena Medin

## 2011-03-12 NOTE — Telephone Encounter (Signed)
Ray [name] does not know if his insurance requires a referral for a Colonoscopy or if he needs an appointment.  He would like someone to look into this for him and call him back with some direction.

## 2011-04-01 ENCOUNTER — Ambulatory Visit (AMBULATORY_SURGERY_CENTER): Admitting: *Deleted

## 2011-04-01 VITALS — Ht 72.0 in | Wt 225.5 lb

## 2011-04-01 DIAGNOSIS — Z1211 Encounter for screening for malignant neoplasm of colon: Secondary | ICD-10-CM

## 2011-04-01 MED ORDER — PEG-KCL-NACL-NASULF-NA ASC-C 100 G PO SOLR: ORAL | Status: DC

## 2011-04-15 ENCOUNTER — Encounter: Payer: Self-pay | Admitting: Gastroenterology

## 2011-04-15 ENCOUNTER — Ambulatory Visit (AMBULATORY_SURGERY_CENTER): Admitting: Gastroenterology

## 2011-04-15 VITALS — BP 133/80 | HR 77 | Temp 98.9°F | Resp 15 | Ht 72.0 in | Wt 225.0 lb

## 2011-04-15 DIAGNOSIS — D126 Benign neoplasm of colon, unspecified: Secondary | ICD-10-CM

## 2011-04-15 DIAGNOSIS — Z1211 Encounter for screening for malignant neoplasm of colon: Secondary | ICD-10-CM

## 2011-04-15 DIAGNOSIS — K5730 Diverticulosis of large intestine without perforation or abscess without bleeding: Secondary | ICD-10-CM

## 2011-04-15 HISTORY — PX: COLONOSCOPY W/ BIOPSIES: SHX1374

## 2011-04-15 MED ORDER — SODIUM CHLORIDE 0.9 % IV SOLN: 500.0000 mL | INTRAVENOUS | Status: DC

## 2011-04-15 NOTE — Op Note (Signed)
 [facility] [address] [place], [place]  [address]  COLONOSCOPY PROCEDURE REPORT  PATIENT:  Ray, [name]  MR#:  [id number] BIRTHDATE:  1955-03-23, 55 yrs. old  GENDER:  male ENDOSCOPIST:  Rachael Fee, MD REFERRING:   Everrett Coombe, MD PROCEDURE DATE:  04/15/2011 PROCEDURE:  Colonoscopy with snare polypectomy ASA CLASS:  Class II INDICATIONS:  Routine Risk Screening MEDICATIONS:   Fentanyl 50 mcg IV, These medications were titrated to patient response per physician's verbal order, Versed 5 mg IV  DESCRIPTION OF PROCEDURE:   After the risks benefits and alternatives of the procedure were thoroughly explained, informed consent was obtained.  Digital rectal exam was performed and revealed no rectal masses.   The LB 180AL [id number] endoscope was introduced through the anus and advanced to the cecum, which was identified by both the appendix and ileocecal valve, without limitations.  The quality of the prep was good..  The instrument was then slowly withdrawn as the colon was fully examined. <<PROCEDUREIMAGES>> FINDINGS:  A sessile polyp was found in the sigmoid colon. This was removed with cold snare and sent to pathology (jar 1) (see image6).  Mild diverticulosis was found in the sigmoid to descending colon segments (see image5).  Internal Hemorrhoids were found.  This was otherwise a normal examination of the colon (see image4, image2, and image7).   Retroflexed views in the rectum revealed no abnormalities. COMPLICATIONS:  None  ENDOSCOPIC IMPRESSION: 1) Sessile polyp in the sigmoid colon, this was removed and sent to pathology 2) Mild diverticulosis in the sigmoid to descending colon segments 3) Internal hemorrhoids 4) Otherwise normal examination  RECOMMENDATIONS: 1) If the polyp(s) removed today are proven to be adenomatous (pre-cancerous) polyps, you will need a repeat colonoscopy in 5 years. Otherwise you should continue to follow colorectal  cancer screening guidelines for "routine risk" patients with colonoscopy in 10 years. You will receive a letter within 1-2 weeks with the results of your biopsy as well as final recommendations. Please call my office if you have not received a letter after 3 weeks.  ______________________________ Rachael Fee, MD  n. eSIGNED:   Rachael Fee at 04/15/2011 11:51 AM  Sandria Senter, [id number]

## 2011-04-15 NOTE — Patient Instructions (Addendum)
One of your biggest health concerns is your smoking.  This increases your risk for most cancers and serious cardiovascular diseases such as strokes, heart attacks.  You should try your best to stop.  If you need assistance, please contact your PCP or Smoking Cessation Class at [facility] [phone]) or [facility]uit[facility]Line (1-800-QUIT-NOW). Discharge instructions given with verbal understanding. Handouts on polyps,diverticulosis and hemorrhoids given. Resume previous medications.YOU HAD AN ENDOSCOPIC PROCEDURE TODAY AT THE [facility]: Refer to the procedure report that was given to you for any specific questions about what was found during the examination.  If the procedure report does not answer your questions, please call your gastroenterologist to clarify.  If you requested that your care partner not be given the details of your procedure findings, then the procedure report has been included in a sealed envelope for you to review at your convenience later.  YOU SHOULD EXPECT: Some feelings of bloating in the abdomen. Passage of more gas than usual.  Walking can help get rid of the air that was put into your GI tract during the procedure and reduce the bloating. If you had a lower endoscopy (such as a colonoscopy or flexible sigmoidoscopy) you may notice spotting of blood in your stool or on the toilet paper. If you underwent a bowel prep for your procedure, then you may not have a normal bowel movement for a few days.  DIET: Your first meal following the procedure should be a light meal and then it is ok to progress to your normal diet.  A half-sandwich or bowl of soup is an example of a good first meal.  Heavy or fried foods are harder to digest and may make you feel nauseous or bloated.  Likewise meals heavy in dairy and vegetables can cause extra gas to form and this can also increase the bloating.  Drink plenty of fluids but you should avoid alcoholic beverages for 24  hours.  ACTIVITY: Your care partner should take you home directly after the procedure.  You should plan to take it easy, moving slowly for the rest of the day.  You can resume normal activity the day after the procedure however you should NOT DRIVE or use heavy machinery for 24 hours (because of the sedation medicines used during the test).    SYMPTOMS TO REPORT IMMEDIATELY: A gastroenterologist can be reached at any hour.  During normal business hours, 8:30 AM to 5:00 PM Monday through Friday, call [phone].  After hours and on weekends, please call the GI answering service at [phone] who will take a message and have the physician on call contact you.   Following lower endoscopy (colonoscopy or flexible sigmoidoscopy):  Excessive amounts of blood in the stool  Significant tenderness or worsening of abdominal pains  Swelling of the abdomen that is new, acute  Fever of 100F or higher FOLLOW UP: If any biopsies were taken you will be contacted by phone or by letter within the next 1-3 weeks.  Call your gastroenterologist if you have not heard about the biopsies in 3 weeks.  Our staff will call the home number listed on your records the next business day following your procedure to check on you and address any questions or concerns that you may have at that time regarding the information given to you following your procedure. This is a courtesy call and so if there is no answer at the home number and we have not heard from you through the  emergency physician on call, we will assume that you have returned to your regular daily activities without incident.  SIGNATURES/CONFIDENTIALITY: You and/or your care partner have signed paperwork which will be entered into your electronic medical record.  These signatures attest to the fact that that the information above on your After Visit Summary has been reviewed and is understood.  Full responsibility of the confidentiality of this discharge  information lies with you and/or your care-partner.
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
## 2011-04-16 ENCOUNTER — Telehealth: Payer: Self-pay | Admitting: *Deleted

## 2011-04-16 NOTE — Telephone Encounter (Signed)
  Follow up Call-  Call back number 04/15/2011  Post procedure Call Back phone  # [phone]  Permission to leave phone message Yes     Patient questions:  Do you have a fever, pain , or abdominal swelling? no Pain Score  0 *  Have you tolerated food without any problems? yes  Have you been able to return to your normal activities? yes  Do you have any questions about your discharge instructions: Diet   no Medications  no Follow up visit  no  Do you have questions or concerns about your Care? no  Actions: * If pain score is 4 or above: No action needed, pain <4.

## 2011-04-19 ENCOUNTER — Encounter: Payer: Self-pay | Admitting: Gastroenterology

## 2011-07-03 ENCOUNTER — Ambulatory Visit (INDEPENDENT_AMBULATORY_CARE_PROVIDER_SITE_OTHER): Admitting: Family Medicine

## 2011-07-03 ENCOUNTER — Encounter: Payer: Self-pay | Admitting: Family Medicine

## 2011-07-03 VITALS — BP 124/90 | HR 82 | Ht 71.0 in | Wt 219.0 lb

## 2011-07-03 DIAGNOSIS — M79604 Pain in right leg: Secondary | ICD-10-CM

## 2011-07-03 DIAGNOSIS — M79609 Pain in unspecified limb: Secondary | ICD-10-CM

## 2011-07-03 MED ORDER — LOSARTAN POTASSIUM 100 MG PO TABS: 100.0000 mg | ORAL_TABLET | Freq: Every day | ORAL | Status: DC

## 2011-07-03 MED ORDER — ALLOPURINOL 300 MG PO TABS: 300.0000 mg | ORAL_TABLET | Freq: Every day | ORAL | Status: DC

## 2011-07-03 MED ORDER — OMEPRAZOLE 40 MG PO CPDR: 40.0000 mg | DELAYED_RELEASE_CAPSULE | Freq: Every day | ORAL | Status: DC

## 2011-07-03 MED ORDER — AMLODIPINE BESYLATE 10 MG PO TABS: 10.0000 mg | ORAL_TABLET | Freq: Every day | ORAL | Status: DC

## 2011-07-03 NOTE — Patient Instructions (Signed)
Thank you for coming in today, it was good to see you I would give the Relafen a try to see if that helps Once you get the paperwork you need, please drop it off and I will review it and let you know if we need anything else to complete the paperwork. Once again good job on you and Ray [name] getting your colonoscopies, no we need to have you cut back on the cigars if possible.
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
## 2011-08-09 ENCOUNTER — Telehealth: Payer: Self-pay | Admitting: Family Medicine

## 2011-08-09 NOTE — Telephone Encounter (Signed)
Patient is calling because he has some personal things going on that he would like to speak to Dr. Ashley Royalty about.

## 2011-08-13 NOTE — Telephone Encounter (Signed)
Called number listed in chart.  No answer, left VM to call back

## 2011-08-14 ENCOUNTER — Ambulatory Visit (INDEPENDENT_AMBULATORY_CARE_PROVIDER_SITE_OTHER): Admitting: Family Medicine

## 2011-08-14 ENCOUNTER — Encounter: Payer: Self-pay | Admitting: Family Medicine

## 2011-08-14 VITALS — BP 135/85 | HR 94 | Ht 71.0 in | Wt 212.0 lb

## 2011-08-14 DIAGNOSIS — S335XXA Sprain of ligaments of lumbar spine, initial encounter: Secondary | ICD-10-CM

## 2011-08-14 DIAGNOSIS — N529 Male erectile dysfunction, unspecified: Secondary | ICD-10-CM

## 2011-08-14 DIAGNOSIS — S39012A Strain of muscle, fascia and tendon of lower back, initial encounter: Secondary | ICD-10-CM

## 2011-08-14 MED ORDER — TADALAFIL 20 MG PO TABS: 20.0000 mg | ORAL_TABLET | Freq: Every day | ORAL | Status: DC | PRN

## 2011-08-14 MED ORDER — TRAMADOL HCL 50 MG PO TABS: 50.0000 mg | ORAL_TABLET | Freq: Three times a day (TID) | ORAL | Status: DC | PRN

## 2011-08-14 MED ORDER — CYCLOBENZAPRINE HCL 10 MG PO TABS: 10.0000 mg | ORAL_TABLET | Freq: Three times a day (TID) | ORAL | Status: DC | PRN

## 2011-08-14 NOTE — Patient Instructions (Addendum)
Thank you for coming in today, it was good to see you I would like for you to try the flexeril and tramadol for the pain in your back If this is not improving in 2 weeks return to see me I will keep an eye out for any paperwork for you.

## 2011-08-15 ENCOUNTER — Telehealth: Payer: Self-pay | Admitting: *Deleted

## 2011-08-15 ENCOUNTER — Telehealth: Payer: Self-pay | Admitting: Family Medicine

## 2011-08-15 NOTE — Telephone Encounter (Signed)
Message left on voicemail that form has been put in MD box for completion and will fax to insurance. Will notify pharmacy when we hear back from insurance. Advised can take several days.

## 2011-08-15 NOTE — Telephone Encounter (Signed)
Patient is calling because he was not able to get the Rx for Tadalifil due to needing authorization for insurance.  [facility] on [address] has sent a fax to that affect.

## 2011-08-15 NOTE — Telephone Encounter (Signed)
Will forward to [facility]

## 2011-08-15 NOTE — Telephone Encounter (Signed)
PA required for cialis. Form placed in MD box.

## 2011-08-16 ENCOUNTER — Telehealth: Payer: Self-pay | Admitting: Family Medicine

## 2011-08-16 ENCOUNTER — Other Ambulatory Visit: Payer: Self-pay | Admitting: Family Medicine

## 2011-08-16 MED ORDER — SILDENAFIL CITRATE 50 MG PO TABS
25.0000 mg | ORAL_TABLET | Freq: Every day | ORAL | Status: DC | PRN
Start: 2011-08-16 — End: 2011-10-22

## 2011-08-16 NOTE — Telephone Encounter (Signed)
Patient is calling because he is not able to work and the [employer] is pushing him to resign.  He needs a letter stating that he is not able to work.

## 2011-08-16 NOTE — Telephone Encounter (Signed)
Patient notified

## 2011-08-16 NOTE — Telephone Encounter (Signed)
tadalafil (cialis) not covered by insurance, sildenafil (viagra) is on formulary and must be tried first for at least 90 days.  Will send in rx for sildenafil.  Please let patient know.  Thanks

## 2011-08-20 DIAGNOSIS — S39012A Strain of muscle, fascia and tendon of lower back, initial encounter: Secondary | ICD-10-CM | POA: Insufficient documentation

## 2011-08-20 NOTE — Assessment & Plan Note (Addendum)
Discussed options for ED including medication, pump.  Would like to try cialis, understands this may not be on insurance formulary.
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
## 2011-08-20 NOTE — Assessment & Plan Note (Addendum)
Explained that this typically resolves on its own.  Given Rx for flexeril and tramadol for pain control for now.  No red flags on exam.

## 2011-08-21 NOTE — Telephone Encounter (Signed)
Letter completed.  He may need intermittent FMLA paperwork completed to actually be able to miss work.

## 2011-10-22 ENCOUNTER — Other Ambulatory Visit: Payer: Self-pay | Admitting: Family Medicine

## 2011-10-22 MED ORDER — SILDENAFIL CITRATE 50 MG PO TABS: 50.0000 mg | ORAL_TABLET | ORAL | Status: DC | PRN

## 2011-10-28 ENCOUNTER — Telehealth: Payer: Self-pay | Admitting: Family Medicine

## 2011-10-28 NOTE — Telephone Encounter (Signed)
Will fwd. To Dr.Matthews to address Viagra request. .Ray [name]

## 2011-10-28 NOTE — Telephone Encounter (Signed)
Concerned about the change in his prescription from 18 for 90 days to 10 for 50 days for his Viagra.  This causes his copayment to be more.  Please call him asap or change back to original order of 18 per 90 days.

## 2011-10-29 MED ORDER — SILDENAFIL CITRATE 50 MG PO TABS: 50.0000 mg | ORAL_TABLET | ORAL | Status: DC | PRN

## 2011-10-29 NOTE — Telephone Encounter (Signed)
Rx changed and sent to pharmacy.  °

## 2011-11-26 ENCOUNTER — Ambulatory Visit

## 2011-11-27 ENCOUNTER — Ambulatory Visit (INDEPENDENT_AMBULATORY_CARE_PROVIDER_SITE_OTHER): Admitting: *Deleted

## 2011-11-27 DIAGNOSIS — Z23 Encounter for immunization: Secondary | ICD-10-CM

## 2011-12-27 ENCOUNTER — Ambulatory Visit
Admission: RE | Admit: 2011-12-27 | Discharge: 2011-12-27 | Disposition: A | Discharge status: Home / Self Care | Source: Ambulatory Visit | Attending: Family Medicine | Admitting: Family Medicine

## 2011-12-27 ENCOUNTER — Ambulatory Visit (INDEPENDENT_AMBULATORY_CARE_PROVIDER_SITE_OTHER): Admitting: Family Medicine

## 2011-12-27 ENCOUNTER — Encounter: Payer: Self-pay | Admitting: Family Medicine

## 2011-12-27 VITALS — BP 139/88 | HR 84 | Ht 71.0 in | Wt 214.4 lb

## 2011-12-27 DIAGNOSIS — M545 Low back pain: Secondary | ICD-10-CM

## 2011-12-27 DIAGNOSIS — N529 Male erectile dysfunction, unspecified: Secondary | ICD-10-CM

## 2011-12-27 MED ORDER — PREDNISONE 50 MG PO TABS: ORAL_TABLET | ORAL | Status: DC

## 2011-12-27 MED ORDER — CYCLOBENZAPRINE HCL 10 MG PO TABS: 10.0000 mg | ORAL_TABLET | Freq: Three times a day (TID) | ORAL | Status: DC | PRN

## 2011-12-27 MED ORDER — TRAMADOL HCL 50 MG PO TABS: 50.0000 mg | ORAL_TABLET | Freq: Three times a day (TID) | ORAL | Status: DC | PRN

## 2011-12-30 NOTE — Progress Notes (Signed)
  Subjective:    Patient ID: Ray [name], male    DOB: 1955-06-21, 56 y.o.   MRN: [id number]  HPI 1. Low back pain:  Low back pain x2 days.  Feels like back "catches" with certain movements.  Has been using tramadol and flexeril that is helpful but flexeril makes him drowsy.  Has had this pain in the past but now he does endorse some radiation of pain down the L leg.  He denies any weakness, bowel or bladder dysfunction.      Review of Systems Per HPI    Objective:   Physical Exam  Constitutional: He appears well-nourished. No distress.  Musculoskeletal:       Palpation of lower back with spasm of lumbar paraspinal muscles, L>R.  SLR positive for pain but no numbness or tingling.  FABER with mild pain          Assessment & Plan:

## 2011-12-30 NOTE — Assessment & Plan Note (Signed)
Likely related to strain with associated spasm, however does have some elements of radicular pain as well.  Will send for xrays of l spine.  Continue flexeril and tramadol for pain control. Will also add prednisone burst to see if this is helpful if there is radicular element.

## 2012-01-07 ENCOUNTER — Ambulatory Visit: Admitting: Emergency Medicine

## 2012-01-08 ENCOUNTER — Encounter: Payer: Self-pay | Admitting: Family Medicine

## 2012-01-08 ENCOUNTER — Ambulatory Visit (INDEPENDENT_AMBULATORY_CARE_PROVIDER_SITE_OTHER): Admitting: Family Medicine

## 2012-01-08 VITALS — BP 122/84 | HR 85 | Ht 71.0 in | Wt 207.0 lb

## 2012-01-08 DIAGNOSIS — M545 Low back pain: Secondary | ICD-10-CM

## 2012-01-08 NOTE — Patient Instructions (Signed)
Thank you for coming in today, it was good to see you Sorry your back is still bothering you Use the flexeril in combination with the tramadol You can use 2 of the tramadol if needed, especially at night. I will refer you to physical therapy Return in one week to follow up with me.

## 2012-01-12 NOTE — Assessment & Plan Note (Signed)
Still appears to be strain of  Low back.  Advised continued use of tramadol for pain control.  Advised trial of flexeril to see if this is helpful as well.  WIll place referral to PT.  IF conservative measures not helpful need to consider MRI. Follow up in one week.

## 2012-01-12 NOTE — Progress Notes (Signed)
  Subjective:    Patient ID: Ray [name], male    DOB: 1955/10/20, 56 y.o.   MRN: [id number]  HPI  1. Low back pain: Here for follow up of back pain.  Initially pain was improving with tramadol and short course of steroids however worsened over this past weekend and he has spent the past two days in bed.  Pain is the same as previous described at "catching" with radiation down leg.  He has not tried flexeril. Tramadol works well but makes him sleepy sometimes.  He denies bowel or bladder incontinence, weakness in legs.      Review of Systems Per HPI    Objective:   Physical Exam  Constitutional: He appears well-nourished. No distress.  HENT:  Head: Normocephalic and atraumatic.  Musculoskeletal: He exhibits no edema.       Spasm along lower lumbar paraspinal musculature, L>R.  Exam unchanged from previous exam.      Neurological: He is alert. No sensory deficit.          Assessment & Plan:

## 2012-01-13 ENCOUNTER — Ambulatory Visit (INDEPENDENT_AMBULATORY_CARE_PROVIDER_SITE_OTHER): Admitting: Family Medicine

## 2012-01-13 ENCOUNTER — Encounter: Payer: Self-pay | Admitting: Family Medicine

## 2012-01-13 VITALS — BP 132/85 | HR 69 | Temp 98.6°F | Ht 71.0 in | Wt 212.0 lb

## 2012-01-13 DIAGNOSIS — M545 Low back pain: Secondary | ICD-10-CM

## 2012-01-19 NOTE — Assessment & Plan Note (Addendum)
Some relief with Flexeril, tramadol, nabumetone. Plan to continue conservative management and set up for physical therapy. He'll followup with me in 4 weeks, or sooner if worsening

## 2012-01-19 NOTE — Progress Notes (Signed)
  Subjective:    Patient ID: Ray [name], male    DOB: 02-28-1955, 56 y.o.   MRN: [id number]  HPI 1. Low back pain: Here for interval followup of low back pain. Been using Flexeril as well as tramadol with some relief of symptoms. States over the weekend his pain was improved slightly but recurred the next day. Also recently trying nabumetone that he had lying around the house, this has helped a little as well. He has not been sent for physical therapy at this time. Overall feels like his back pain is stable and not worsening. He states that he does not have any foot drop, bowel or bladder incontinence.   Review of Systems Per history of present illness    Objective:   Physical Exam  Constitutional: He appears well-nourished. No distress.  Musculoskeletal: He exhibits no edema.       Still with mild spasm of paraspinal muscles. Exam otherwise unchanged from previous.          Assessment & Plan:

## 2012-01-21 ENCOUNTER — Ambulatory Visit: Attending: Family Medicine | Admitting: Rehabilitative and Restorative Service Providers"

## 2012-01-21 DIAGNOSIS — M2569 Stiffness of other specified joint, not elsewhere classified: Secondary | ICD-10-CM | POA: Insufficient documentation

## 2012-01-21 DIAGNOSIS — M545 Low back pain, unspecified: Secondary | ICD-10-CM | POA: Insufficient documentation

## 2012-01-21 DIAGNOSIS — IMO0001 Reserved for inherently not codable concepts without codable children: Secondary | ICD-10-CM | POA: Insufficient documentation

## 2012-02-03 ENCOUNTER — Ambulatory Visit: Admitting: Physical Therapy

## 2012-02-06 ENCOUNTER — Ambulatory Visit: Attending: Family Medicine | Admitting: Rehabilitative and Restorative Service Providers"

## 2012-02-06 DIAGNOSIS — M545 Low back pain, unspecified: Secondary | ICD-10-CM | POA: Insufficient documentation

## 2012-02-06 DIAGNOSIS — M2569 Stiffness of other specified joint, not elsewhere classified: Secondary | ICD-10-CM | POA: Insufficient documentation

## 2012-02-06 DIAGNOSIS — IMO0001 Reserved for inherently not codable concepts without codable children: Secondary | ICD-10-CM | POA: Insufficient documentation

## 2012-02-11 ENCOUNTER — Encounter: Payer: Self-pay | Admitting: Family Medicine

## 2012-02-11 ENCOUNTER — Ambulatory Visit: Admitting: Physical Therapy

## 2012-02-11 ENCOUNTER — Ambulatory Visit (INDEPENDENT_AMBULATORY_CARE_PROVIDER_SITE_OTHER): Admitting: Family Medicine

## 2012-02-11 VITALS — BP 150/90 | HR 71 | Ht 71.0 in | Wt 217.0 lb

## 2012-02-11 DIAGNOSIS — M545 Low back pain: Secondary | ICD-10-CM

## 2012-02-11 DIAGNOSIS — N529 Male erectile dysfunction, unspecified: Secondary | ICD-10-CM

## 2012-02-11 MED ORDER — DULOXETINE HCL 30 MG PO CPEP: ORAL_CAPSULE | ORAL | Status: DC

## 2012-02-11 MED ORDER — CYCLOBENZAPRINE HCL 10 MG PO TABS: 10.0000 mg | ORAL_TABLET | Freq: Three times a day (TID) | ORAL | Status: AC | PRN

## 2012-02-11 MED ORDER — TRAMADOL HCL 50 MG PO TABS: 50.0000 mg | ORAL_TABLET | Freq: Three times a day (TID) | ORAL | Status: AC | PRN

## 2012-02-13 ENCOUNTER — Encounter: Admitting: Physical Therapy

## 2012-02-14 ENCOUNTER — Ambulatory Visit
Admission: RE | Admit: 2012-02-14 | Discharge: 2012-02-14 | Disposition: A | Discharge status: Home / Self Care | Source: Ambulatory Visit | Attending: Family Medicine | Admitting: Family Medicine

## 2012-02-14 DIAGNOSIS — M545 Low back pain: Secondary | ICD-10-CM

## 2012-02-16 NOTE — Assessment & Plan Note (Signed)
COntinued low back pain.  PT has helped some but now with worsened radiation into R leg.  Will send for MRI for further evaluation.  COntinue flexeril and tramadol for pain control.

## 2012-02-16 NOTE — Progress Notes (Signed)
  Subjective:    Patient ID: Ray [name], male    DOB: Mar 21, 1955, 57 y.o.   MRN: [id number]  HPI  1. Back pain:  Here for follow up of back pain.  States he has started PT and was going well but after the past few sessions he has had worse pain immediately afterwards.  Having increased pain radiating down R leg.  Denies weakness in the extremity.  Using tramadol and flexeril for pain control.  Steroids did not provide much relief.  Flexeril seems to help pain the most.  Denies bowel or bladder dysfunction.   Review of Systems PEr HPI    Objective:   Physical Exam  Constitutional: He appears well-nourished. No distress.  Musculoskeletal:       L spine with tenderness and spasm on the L.  He has a + straight leg raise on the R.  Reflexes are 2+ bilaterally with good strength          Assessment & Plan:

## 2012-02-17 ENCOUNTER — Encounter: Admitting: Rehabilitation

## 2012-02-17 ENCOUNTER — Telehealth: Payer: Self-pay | Admitting: Family Medicine

## 2012-02-17 DIAGNOSIS — M5416 Radiculopathy, lumbar region: Secondary | ICD-10-CM

## 2012-02-17 DIAGNOSIS — R937 Abnormal findings on diagnostic imaging of other parts of musculoskeletal system: Secondary | ICD-10-CM

## 2012-02-17 NOTE — Telephone Encounter (Signed)
Called patient to review results of MRI.  Left VM for him to call back so that I can review findings with him.

## 2012-02-17 NOTE — Telephone Encounter (Signed)
Explained results of MRI. Multilevel DDD. I think this is likely contributing to his pain.  Will refer to neurosurgery for him to discuss further discuss options.

## 2012-02-17 NOTE — Telephone Encounter (Signed)
Pt returning Dr. Anastasio Auerbach call regarding MRI results.Ray [name]

## 2012-02-19 ENCOUNTER — Encounter: Admitting: Rehabilitative and Restorative Service Providers"

## 2012-02-24 ENCOUNTER — Encounter: Admitting: Rehabilitation

## 2012-02-26 ENCOUNTER — Encounter: Admitting: Rehabilitation

## 2012-03-17 ENCOUNTER — Other Ambulatory Visit: Payer: Self-pay | Admitting: Neurosurgery

## 2012-03-17 DIAGNOSIS — M542 Cervicalgia: Secondary | ICD-10-CM

## 2012-03-26 ENCOUNTER — Encounter: Payer: Self-pay | Admitting: Physical Medicine & Rehabilitation

## 2012-03-26 ENCOUNTER — Ambulatory Visit
Admission: RE | Admit: 2012-03-26 | Discharge: 2012-03-26 | Disposition: A | Discharge status: Home / Self Care | Source: Ambulatory Visit | Attending: Neurosurgery | Admitting: Neurosurgery

## 2012-03-26 DIAGNOSIS — M542 Cervicalgia: Secondary | ICD-10-CM

## 2012-04-14 ENCOUNTER — Ambulatory Visit (HOSPITAL_BASED_OUTPATIENT_CLINIC_OR_DEPARTMENT_OTHER): Admitting: Physical Medicine & Rehabilitation

## 2012-04-14 ENCOUNTER — Encounter: Attending: Physical Medicine & Rehabilitation

## 2012-04-14 ENCOUNTER — Other Ambulatory Visit: Payer: Self-pay | Admitting: Neurosurgery

## 2012-04-14 ENCOUNTER — Encounter: Payer: Self-pay | Admitting: Physical Medicine & Rehabilitation

## 2012-04-14 VITALS — BP 140/90 | HR 83 | Resp 14 | Ht 70.0 in | Wt 216.0 lb

## 2012-04-14 DIAGNOSIS — M4802 Spinal stenosis, cervical region: Secondary | ICD-10-CM | POA: Insufficient documentation

## 2012-04-14 DIAGNOSIS — F17200 Nicotine dependence, unspecified, uncomplicated: Secondary | ICD-10-CM | POA: Insufficient documentation

## 2012-04-14 DIAGNOSIS — M545 Low back pain, unspecified: Secondary | ICD-10-CM | POA: Insufficient documentation

## 2012-04-14 DIAGNOSIS — G8929 Other chronic pain: Secondary | ICD-10-CM

## 2012-04-14 DIAGNOSIS — IMO0002 Reserved for concepts with insufficient information to code with codable children: Secondary | ICD-10-CM

## 2012-04-14 DIAGNOSIS — G8928 Other chronic postprocedural pain: Secondary | ICD-10-CM | POA: Insufficient documentation

## 2012-04-14 DIAGNOSIS — E785 Hyperlipidemia, unspecified: Secondary | ICD-10-CM | POA: Insufficient documentation

## 2012-04-14 DIAGNOSIS — M533 Sacrococcygeal disorders, not elsewhere classified: Secondary | ICD-10-CM

## 2012-04-14 DIAGNOSIS — M5416 Radiculopathy, lumbar region: Secondary | ICD-10-CM

## 2012-04-14 DIAGNOSIS — Z5181 Encounter for therapeutic drug level monitoring: Secondary | ICD-10-CM

## 2012-04-14 DIAGNOSIS — M79609 Pain in unspecified limb: Secondary | ICD-10-CM | POA: Insufficient documentation

## 2012-04-14 DIAGNOSIS — I10 Essential (primary) hypertension: Secondary | ICD-10-CM | POA: Insufficient documentation

## 2012-04-14 DIAGNOSIS — M25559 Pain in unspecified hip: Secondary | ICD-10-CM | POA: Insufficient documentation

## 2012-04-14 DIAGNOSIS — K219 Gastro-esophageal reflux disease without esophagitis: Secondary | ICD-10-CM | POA: Insufficient documentation

## 2012-04-14 DIAGNOSIS — G8921 Chronic pain due to trauma: Secondary | ICD-10-CM | POA: Insufficient documentation
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
## 2012-04-14 NOTE — Addendum Note (Signed)
Addended by: Judd Gaudier on: 04/14/2012 12:11 PM   Modules accepted: Orders

## 2012-04-15 NOTE — Pre-Procedure Instructions (Signed)
ALICK LECOMTE  04/15/2012   Your procedure is scheduled on:  Monday April 20, 2012  Report to [facility] at 0900 AM.  Call this number if you have problems the morning of surgery: [phone]   Remember:   Do not eat food or drink liquids after midnight.   Take these medicines the morning of surgery with A SIP OF WATER: allopurinol, amlodipine, colchicine, cymbalta, omeprazole, tramadol, flexeril   Do not wear jewelry  Do not wear lotions, powders, or perfumes.   Do not shave 48 hours prior to surgery. Men may shave face and neck.  Do not bring valuables to the hospital.  Contacts, dentures or bridgework may not be worn into surgery.  Leave suitcase in the car. After surgery it may be brought to your room.  For patients admitted to the hospital, checkout time is 11:00 AM the day of  discharge.   Patients discharged the day of surgery will not be allowed to drive  home.  Name and phone number of your driver: family / friend  Special Instructions: Shower using CHG 2 nights before surgery and the night before surgery.  If you shower the day of surgery use CHG.  Use special wash - you have one bottle of CHG for all showers.  You should use approximately 1/3 of the bottle for each shower.   Please read over the following fact sheets that you were given: Pain Booklet, Coughing and Deep Breathing, MRSA Information and Surgical Site Infection Prevention

## 2012-04-16 ENCOUNTER — Encounter (HOSPITAL_COMMUNITY)
Admission: RE | Admit: 2012-04-16 | Discharge: 2012-04-16 | Disposition: A | Discharge status: Home / Self Care | Source: Ambulatory Visit | Attending: Neurosurgery | Admitting: Neurosurgery

## 2012-04-16 ENCOUNTER — Encounter (HOSPITAL_COMMUNITY): Payer: Self-pay

## 2012-04-16 DIAGNOSIS — Z01818 Encounter for other preprocedural examination: Secondary | ICD-10-CM | POA: Insufficient documentation

## 2012-04-16 DIAGNOSIS — Z01810 Encounter for preprocedural cardiovascular examination: Secondary | ICD-10-CM | POA: Insufficient documentation

## 2012-04-16 DIAGNOSIS — I10 Essential (primary) hypertension: Secondary | ICD-10-CM | POA: Insufficient documentation

## 2012-04-16 DIAGNOSIS — M47812 Spondylosis without myelopathy or radiculopathy, cervical region: Secondary | ICD-10-CM | POA: Insufficient documentation

## 2012-04-16 DIAGNOSIS — Z01812 Encounter for preprocedural laboratory examination: Secondary | ICD-10-CM | POA: Insufficient documentation

## 2012-04-16 HISTORY — DX: Personal history of other diseases of the digestive system: Z87.19

## 2012-04-16 HISTORY — DX: Unspecified osteoarthritis, unspecified site: M19.90

## 2012-04-16 LAB — SURGICAL PCR SCREEN
MRSA, PCR: NEGATIVE
Staphylococcus aureus: NEGATIVE

## 2012-04-16 LAB — CBC
Hemoglobin: 15.8 g/dL (ref 13.0–17.0)
MCH: 28.8 pg (ref 26.0–34.0)
MCHC: 34.9 g/dL (ref 30.0–36.0)
Platelets: 161 10*3/uL (ref 150–400)
RDW: 14.7 % (ref 11.5–15.5)

## 2012-04-16 LAB — BASIC METABOLIC PANEL
BUN: 10 mg/dL (ref 6–23)
Calcium: 9.6 mg/dL (ref 8.4–10.5)
Creatinine, Ser: 1.13 mg/dL (ref 0.50–1.35)
GFR calc Af Amer: 82 mL/min — ABNORMAL LOW (ref >90)
GFR calc non Af Amer: 70 mL/min — ABNORMAL LOW (ref >90)
Glucose, Bld: 112 mg/dL — ABNORMAL HIGH (ref 70–99)
Potassium: 4.2 mEq/L (ref 3.5–5.1)

## 2012-04-16 NOTE — Progress Notes (Signed)
Call to Chanel at Dr. Lovell Sheehan office, she reports MD will not sign orders until pt. Is back ([facility]) for surgery.

## 2012-04-16 NOTE — Progress Notes (Addendum)
Pt. Reports that he had baseline cardiac/stress test several yrs. Ago for the [employer] >10 yrs. ago.  Everything found to be WNL. Pt. Reports that he hasn't ever had a sleep study.

## 2012-04-17 ENCOUNTER — Encounter (HOSPITAL_COMMUNITY): Payer: Self-pay | Admitting: Pharmacy Technician

## 2012-04-19 MED ORDER — CEFAZOLIN SODIUM-DEXTROSE 2-3 GM-% IV SOLR
2.0000 g | INTRAVENOUS | Status: AC
  Administered 2012-04-20: 2 g via INTRAVENOUS
  Filled 2012-04-19: qty 50

## 2012-04-20 ENCOUNTER — Encounter (HOSPITAL_COMMUNITY)
Admission: RE | Disposition: A | Discharge status: Home / Self Care | Payer: Self-pay | Source: Ambulatory Visit | Attending: Neurosurgery

## 2012-04-20 ENCOUNTER — Encounter (HOSPITAL_COMMUNITY): Payer: Self-pay | Admitting: Anesthesiology

## 2012-04-20 ENCOUNTER — Encounter (HOSPITAL_COMMUNITY): Payer: Self-pay | Admitting: *Deleted

## 2012-04-20 ENCOUNTER — Ambulatory Visit (HOSPITAL_COMMUNITY)

## 2012-04-20 ENCOUNTER — Ambulatory Visit (HOSPITAL_COMMUNITY): Admitting: Anesthesiology

## 2012-04-20 ENCOUNTER — Inpatient Hospital Stay (HOSPITAL_COMMUNITY)
Admission: RE | Admit: 2012-04-20 | Discharge: 2012-04-24 | DRG: 473 | Disposition: A | Discharge status: Home / Self Care | Source: Ambulatory Visit | Attending: Neurosurgery | Admitting: Neurosurgery

## 2012-04-20 DIAGNOSIS — M1289 Other specific arthropathies, not elsewhere classified, multiple sites: Secondary | ICD-10-CM | POA: Diagnosis present

## 2012-04-20 DIAGNOSIS — Z79899 Other long term (current) drug therapy: Secondary | ICD-10-CM

## 2012-04-20 DIAGNOSIS — M531 Cervicobrachial syndrome: Principal | ICD-10-CM | POA: Diagnosis present

## 2012-04-20 DIAGNOSIS — M4802 Spinal stenosis, cervical region: Secondary | ICD-10-CM

## 2012-04-20 DIAGNOSIS — I10 Essential (primary) hypertension: Secondary | ICD-10-CM | POA: Diagnosis present

## 2012-04-20 DIAGNOSIS — M2489 Other specific joint derangement of other specified joint, not elsewhere classified: Secondary | ICD-10-CM | POA: Diagnosis present

## 2012-04-20 DIAGNOSIS — F17200 Nicotine dependence, unspecified, uncomplicated: Secondary | ICD-10-CM | POA: Diagnosis present

## 2012-04-20 DIAGNOSIS — K219 Gastro-esophageal reflux disease without esophagitis: Secondary | ICD-10-CM | POA: Diagnosis present

## 2012-04-20 DIAGNOSIS — E785 Hyperlipidemia, unspecified: Secondary | ICD-10-CM | POA: Diagnosis present

## 2012-04-20 DIAGNOSIS — Z7982 Long term (current) use of aspirin: Secondary | ICD-10-CM

## 2012-04-20 HISTORY — PX: POSTERIOR CERVICAL FUSION/FORAMINOTOMY: SHX5038

## 2012-04-20 SURGERY — POSTERIOR CERVICAL FUSION/FORAMINOTOMY LEVEL 1
Anesthesia: General | Site: Spine Cervical | Wound class: Clean

## 2012-04-20 MED ORDER — PHENOL 1.4 % MT LIQD: 1.0000 | OROMUCOSAL | Status: DC | PRN

## 2012-04-20 MED ORDER — FENTANYL CITRATE 0.05 MG/ML IJ SOLN
INTRAMUSCULAR | Status: DC | PRN
  Administered 2012-04-20 (×2): 50 ug via INTRAVENOUS
  Administered 2012-04-20: 150 ug via INTRAVENOUS
  Administered 2012-04-20: 50 ug via INTRAVENOUS
  Administered 2012-04-20: 100 ug via INTRAVENOUS

## 2012-04-20 MED ORDER — HYDROCODONE-ACETAMINOPHEN 5-325 MG PO TABS: 1.0000 | ORAL_TABLET | ORAL | Status: DC | PRN

## 2012-04-20 MED ORDER — DOCUSATE SODIUM 100 MG PO CAPS
100.0000 mg | ORAL_CAPSULE | Freq: Two times a day (BID) | ORAL | Status: DC
  Administered 2012-04-20 – 2012-04-23 (×6): 100 mg via ORAL
  Filled 2012-04-20 (×7): qty 1

## 2012-04-20 MED ORDER — LOSARTAN POTASSIUM 50 MG PO TABS: 100.0000 mg | ORAL_TABLET | Freq: Every day | ORAL | Status: DC

## 2012-04-20 MED ORDER — AMLODIPINE BESYLATE 10 MG PO TABS
10.0000 mg | ORAL_TABLET | Freq: Every day | ORAL | Status: DC
  Administered 2012-04-21 – 2012-04-24 (×4): 10 mg via ORAL
  Filled 2012-04-20 (×5): qty 1

## 2012-04-20 MED ORDER — 0.9 % SODIUM CHLORIDE (POUR BTL) OPTIME
TOPICAL | Status: DC | PRN
  Administered 2012-04-20: 1000 mL

## 2012-04-20 MED ORDER — ONDANSETRON HCL 4 MG/2ML IJ SOLN
4.0000 mg | INTRAMUSCULAR | Status: DC | PRN
  Administered 2012-04-21: 4 mg via INTRAVENOUS
  Filled 2012-04-20: qty 2

## 2012-04-20 MED ORDER — DIAZEPAM 5 MG PO TABS
ORAL_TABLET | ORAL | Status: AC
  Filled 2012-04-20: qty 1

## 2012-04-20 MED ORDER — NEOSTIGMINE METHYLSULFATE 1 MG/ML IJ SOLN
INTRAMUSCULAR | Status: DC | PRN
  Administered 2012-04-20: 4 mg via INTRAVENOUS

## 2012-04-20 MED ORDER — LACTATED RINGERS IV SOLN
INTRAVENOUS | Status: DC | PRN
  Administered 2012-04-20 (×2): via INTRAVENOUS

## 2012-04-20 MED ORDER — BUPIVACAINE-EPINEPHRINE 0.5% -1:200000 IJ SOLN
INTRAMUSCULAR | Status: DC | PRN
  Administered 2012-04-20: 10 mL

## 2012-04-20 MED ORDER — BUPIVACAINE LIPOSOME 1.3 % IJ SUSP
INTRAMUSCULAR | Status: DC | PRN
  Administered 2012-04-20: 20 mL

## 2012-04-20 MED ORDER — BUPIVACAINE LIPOSOME 1.3 % IJ SUSP
20.0000 mL | Freq: Once | INTRAMUSCULAR | Status: DC
  Filled 2012-04-20: qty 20

## 2012-04-20 MED ORDER — NALOXONE HCL 0.4 MG/ML IJ SOLN: 0.4000 mg | INTRAMUSCULAR | Status: DC | PRN

## 2012-04-20 MED ORDER — ONDANSETRON HCL 4 MG/2ML IJ SOLN
INTRAMUSCULAR | Status: DC | PRN
  Administered 2012-04-20: 4 mg via INTRAVENOUS

## 2012-04-20 MED ORDER — MENTHOL 3 MG MT LOZG: 1.0000 | LOZENGE | OROMUCOSAL | Status: DC | PRN

## 2012-04-20 MED ORDER — OXYCODONE-ACETAMINOPHEN 5-325 MG PO TABS
1.0000 | ORAL_TABLET | ORAL | Status: DC | PRN
  Administered 2012-04-20 – 2012-04-24 (×17): 2 via ORAL
  Filled 2012-04-20 (×16): qty 2

## 2012-04-20 MED ORDER — ROCURONIUM BROMIDE 100 MG/10ML IV SOLN
INTRAVENOUS | Status: DC | PRN
  Administered 2012-04-20: 10 mg via INTRAVENOUS
  Administered 2012-04-20: 50 mg via INTRAVENOUS

## 2012-04-20 MED ORDER — SODIUM CHLORIDE 0.9 % IR SOLN
Status: DC | PRN
  Administered 2012-04-20: 14:00:00

## 2012-04-20 MED ORDER — ACETAMINOPHEN 650 MG RE SUPP: 650.0000 mg | RECTAL | Status: DC | PRN

## 2012-04-20 MED ORDER — HYDROMORPHONE HCL PF 1 MG/ML IJ SOLN: 0.2500 mg | INTRAMUSCULAR | Status: DC | PRN

## 2012-04-20 MED ORDER — BACITRACIN 50000 UNITS IM SOLR
INTRAMUSCULAR | Status: AC
  Filled 2012-04-20: qty 1

## 2012-04-20 MED ORDER — ALLOPURINOL 300 MG PO TABS
300.0000 mg | ORAL_TABLET | Freq: Every day | ORAL | Status: DC
  Administered 2012-04-21 – 2012-04-24 (×4): 300 mg via ORAL
  Filled 2012-04-20 (×4): qty 1

## 2012-04-20 MED ORDER — MORPHINE SULFATE (PF) 1 MG/ML IV SOLN
INTRAVENOUS | Status: AC
  Administered 2012-04-21: 03:00:00
  Filled 2012-04-20: qty 25

## 2012-04-20 MED ORDER — ALUM & MAG HYDROXIDE-SIMETH 200-200-20 MG/5ML PO SUSP: 30.0000 mL | Freq: Four times a day (QID) | ORAL | Status: DC | PRN

## 2012-04-20 MED ORDER — SODIUM CHLORIDE 0.9 % IJ SOLN: 9.0000 mL | INTRAMUSCULAR | Status: DC | PRN

## 2012-04-20 MED ORDER — OXYCODONE-ACETAMINOPHEN 5-325 MG PO TABS
ORAL_TABLET | ORAL | Status: AC
  Filled 2012-04-20: qty 2

## 2012-04-20 MED ORDER — DIPHENHYDRAMINE HCL 12.5 MG/5ML PO ELIX: 12.5000 mg | ORAL_SOLUTION | Freq: Four times a day (QID) | ORAL | Status: DC | PRN

## 2012-04-20 MED ORDER — DIPHENHYDRAMINE HCL 50 MG/ML IJ SOLN: 12.5000 mg | Freq: Four times a day (QID) | INTRAMUSCULAR | Status: DC | PRN

## 2012-04-20 MED ORDER — LACTATED RINGERS IV SOLN
INTRAVENOUS | Status: DC
  Administered 2012-04-20 – 2012-04-21 (×2): via INTRAVENOUS

## 2012-04-20 MED ORDER — THROMBIN 5000 UNITS EX SOLR
CUTANEOUS | Status: DC | PRN
  Administered 2012-04-20 (×2): 5000 [IU] via TOPICAL

## 2012-04-20 MED ORDER — CEFAZOLIN SODIUM-DEXTROSE 2-3 GM-% IV SOLR
2.0000 g | Freq: Three times a day (TID) | INTRAVENOUS | Status: AC
  Administered 2012-04-20 – 2012-04-21 (×2): 2 g via INTRAVENOUS
  Filled 2012-04-20 (×2): qty 50

## 2012-04-20 MED ORDER — LOSARTAN POTASSIUM 50 MG PO TABS
100.0000 mg | ORAL_TABLET | Freq: Every day | ORAL | Status: DC
  Administered 2012-04-21 – 2012-04-24 (×4): 100 mg via ORAL
  Filled 2012-04-20 (×5): qty 2

## 2012-04-20 MED ORDER — MIDAZOLAM HCL 5 MG/5ML IJ SOLN
INTRAMUSCULAR | Status: DC | PRN
  Administered 2012-04-20: 2 mg via INTRAVENOUS

## 2012-04-20 MED ORDER — ACETAMINOPHEN 325 MG PO TABS: 650.0000 mg | ORAL_TABLET | ORAL | Status: DC | PRN

## 2012-04-20 MED ORDER — PANTOPRAZOLE SODIUM 40 MG PO TBEC
80.0000 mg | DELAYED_RELEASE_TABLET | Freq: Every day | ORAL | Status: DC
  Administered 2012-04-21 – 2012-04-24 (×4): 80 mg via ORAL
  Filled 2012-04-20 (×5): qty 2

## 2012-04-20 MED ORDER — HEMOSTATIC AGENTS (NO CHARGE) OPTIME
TOPICAL | Status: DC | PRN
  Administered 2012-04-20: 1 via TOPICAL

## 2012-04-20 MED ORDER — LIDOCAINE HCL (CARDIAC) 20 MG/ML IV SOLN
INTRAVENOUS | Status: DC | PRN
  Administered 2012-04-20: 60 mg via INTRAVENOUS

## 2012-04-20 MED ORDER — DULOXETINE HCL 30 MG PO CPEP
30.0000 mg | ORAL_CAPSULE | Freq: Every day | ORAL | Status: DC
  Filled 2012-04-20: qty 1

## 2012-04-20 MED ORDER — ONDANSETRON HCL 4 MG/2ML IJ SOLN: 4.0000 mg | Freq: Four times a day (QID) | INTRAMUSCULAR | Status: DC | PRN

## 2012-04-20 MED ORDER — SODIUM CHLORIDE 0.9 % IV SOLN
INTRAVENOUS | Status: AC
  Filled 2012-04-20: qty 500

## 2012-04-20 MED ORDER — BACITRACIN ZINC 500 UNIT/GM EX OINT
TOPICAL_OINTMENT | CUTANEOUS | Status: DC | PRN
  Administered 2012-04-20 (×2): 1 via TOPICAL

## 2012-04-20 MED ORDER — MORPHINE SULFATE 2 MG/ML IJ SOLN: 1.0000 mg | INTRAMUSCULAR | Status: DC | PRN

## 2012-04-20 MED ORDER — ONDANSETRON HCL 4 MG/2ML IJ SOLN: 4.0000 mg | Freq: Once | INTRAMUSCULAR | Status: DC | PRN

## 2012-04-20 MED ORDER — DIAZEPAM 5 MG PO TABS
5.0000 mg | ORAL_TABLET | Freq: Four times a day (QID) | ORAL | Status: DC | PRN
  Administered 2012-04-20 – 2012-04-23 (×7): 5 mg via ORAL
  Filled 2012-04-20 (×6): qty 1

## 2012-04-20 MED ORDER — MORPHINE SULFATE (PF) 1 MG/ML IV SOLN
INTRAVENOUS | Status: DC
  Administered 2012-04-20: 7.5 mg via INTRAVENOUS
  Administered 2012-04-20: 6 mg via INTRAVENOUS
  Administered 2012-04-20: 17:00:00 via INTRAVENOUS
  Administered 2012-04-20: 4.5 mg via INTRAVENOUS
  Administered 2012-04-21: 10.5 mg via INTRAVENOUS
  Administered 2012-04-21: 4.14 mg via INTRAVENOUS
  Filled 2012-04-20: qty 25

## 2012-04-20 MED ORDER — GLYCOPYRROLATE 0.2 MG/ML IJ SOLN
INTRAMUSCULAR | Status: DC | PRN
  Administered 2012-04-20: .6 mg via INTRAVENOUS

## 2012-04-20 MED ORDER — PROPOFOL 10 MG/ML IV BOLUS
INTRAVENOUS | Status: DC | PRN
  Administered 2012-04-20: 300 mg via INTRAVENOUS

## 2012-04-20 SURGICAL SUPPLY — 74 items
48mm UCSS screw (Screw) ×1 IMPLANT
ADH SKN CLS APL DERMABOND .7 (GAUZE/BANDAGES/DRESSINGS)
APL SKNCLS STERI-STRIP NONHPOA (GAUZE/BANDAGES/DRESSINGS) ×2
BAG DECANTER FOR FLEXI CONT (MISCELLANEOUS) ×2 IMPLANT
BENZOIN TINCTURE PRP APPL 2/3 (GAUZE/BANDAGES/DRESSINGS) ×2 IMPLANT
BIT DRILL NEURO 2X3.1 SFT TUCH (MISCELLANEOUS) ×1 IMPLANT
BIT DRILL UCSC CANN STRL (BIT) IMPLANT
BLADE SURG 11 STRL SS (BLADE) ×1 IMPLANT
BLADE SURG ROTATE 9660 (MISCELLANEOUS) IMPLANT
BLADE ULTRA TIP 2M (BLADE) IMPLANT
BRUSH SCRUB EZ 1% IODOPHOR (MISCELLANEOUS) IMPLANT
BRUSH SCRUB EZ PLAIN DRY (MISCELLANEOUS) IMPLANT
CABLE SNG STERILE W/CRIMP (Neuro Prosthesis/Implant) ×1 IMPLANT
CANISTER SUCTION 2500CC (MISCELLANEOUS) ×1 IMPLANT
CLOTH BEACON ORANGE TIMEOUT ST (SAFETY) ×2 IMPLANT
CONT SPEC 4OZ CLIKSEAL STRL BL (MISCELLANEOUS) ×2 IMPLANT
DECANTER SPIKE VIAL GLASS SM (MISCELLANEOUS) ×2 IMPLANT
DERMABOND ADVANCED (GAUZE/BANDAGES/DRESSINGS)
DERMABOND ADVANCED .7 DNX12 (GAUZE/BANDAGES/DRESSINGS) IMPLANT
DRAPE C-ARM 42X72 X-RAY (DRAPES) ×5 IMPLANT
DRAPE LAPAROTOMY 100X72 PEDS (DRAPES) ×2 IMPLANT
DRAPE MICROSCOPE LEICA (MISCELLANEOUS) IMPLANT
DRAPE POUCH INSTRU U-SHP 10X18 (DRAPES) ×2 IMPLANT
DRILL BIT UCSC CANN STERILE (BIT) ×2
DRILL NEURO 2X3.1 SOFT TOUCH (MISCELLANEOUS) ×2
ELECT BLADE 6.5 EXT (BLADE) ×1 IMPLANT
ELECT REM PT RETURN 9FT ADLT (ELECTROSURGICAL) ×2
ELECTRODE REM PT RTRN 9FT ADLT (ELECTROSURGICAL) ×1 IMPLANT
GAUZE SPONGE 4X4 16PLY XRAY LF (GAUZE/BANDAGES/DRESSINGS) ×1 IMPLANT
GLOVE BIO SURGEON STRL SZ8.5 (GLOVE) ×2 IMPLANT
GLOVE EXAM NITRILE LRG STRL (GLOVE) IMPLANT
GLOVE EXAM NITRILE MD LF STRL (GLOVE) IMPLANT
GLOVE EXAM NITRILE XL STR (GLOVE) IMPLANT
GLOVE EXAM NITRILE XS STR PU (GLOVE) IMPLANT
GLOVE SS BIOGEL STRL SZ 8 (GLOVE) ×1 IMPLANT
GLOVE SUPERSENSE BIOGEL SZ 8 (GLOVE) ×1
GOWN BRE IMP SLV AUR LG STRL (GOWN DISPOSABLE) IMPLANT
GOWN BRE IMP SLV AUR XL STRL (GOWN DISPOSABLE) ×2 IMPLANT
GOWN STRL REIN 2XL LVL4 (GOWN DISPOSABLE) ×2 IMPLANT
GUIDEWIRE USC 24 THREADED (WIRE) IMPLANT
GUIDEWIRE USC THREADED (WIRE) ×4
HEMOSTAT SURGICEL 2X14 (HEMOSTASIS) ×2 IMPLANT
KIT BASIN OR (CUSTOM PROCEDURE TRAY) ×2 IMPLANT
KIT INFUSE X SMALL 1.4CC (Orthopedic Implant) ×1 IMPLANT
KIT ROOM TURNOVER OR (KITS) ×2 IMPLANT
NDL HYPO 21X1.5 SAFETY (NEEDLE) IMPLANT
NDL SPNL 18GX3.5 QUINCKE PK (NEEDLE) IMPLANT
NEEDLE HYPO 21X1.5 SAFETY (NEEDLE) ×2 IMPLANT
NEEDLE HYPO 22GX1.5 SAFETY (NEEDLE) ×2 IMPLANT
NEEDLE SPNL 18GX3.5 QUINCKE PK (NEEDLE) IMPLANT
NS IRRIG 1000ML POUR BTL (IV SOLUTION) ×2 IMPLANT
PACK LAMINECTOMY NEURO (CUSTOM PROCEDURE TRAY) ×2 IMPLANT
PAD ARMBOARD 7.5X6 YLW CONV (MISCELLANEOUS) ×6 IMPLANT
PATTIES SURGICAL .25X.25 (GAUZE/BANDAGES/DRESSINGS) IMPLANT
PIN MAYFIELD SKULL DISP (PIN) ×2 IMPLANT
RUBBERBAND STERILE (MISCELLANEOUS) IMPLANT
SCREW LAG 42MM ×1 IMPLANT
SPONGE GAUZE 4X4 12PLY (GAUZE/BANDAGES/DRESSINGS) ×1 IMPLANT
SPONGE LAP 4X18 X RAY DECT (DISPOSABLE) IMPLANT
SPONGE NEURO XRAY DETECT 1X3 (DISPOSABLE) IMPLANT
SPONGE SURGIFOAM ABS GEL SZ50 (HEMOSTASIS) ×1 IMPLANT
STAPLER SKIN PROX WIDE 3.9 (STAPLE) ×1 IMPLANT
STRIP CLOSURE SKIN 1/2X4 (GAUZE/BANDAGES/DRESSINGS) ×2 IMPLANT
STRIP ILIUM TRICORT 2.2CMX60MM (Neuro Prosthesis/Implant) ×1 IMPLANT
SUT ETHILON 2 0 FS 18 (SUTURE) IMPLANT
SUT VIC AB 0 CT1 18XCR BRD8 (SUTURE) ×1 IMPLANT
SUT VIC AB 0 CT1 8-18 (SUTURE) ×4
SUT VIC AB 2-0 CP2 18 (SUTURE) ×4 IMPLANT
SYR 20ML ECCENTRIC (SYRINGE) ×2 IMPLANT
TOWEL OR 17X24 6PK STRL BLUE (TOWEL DISPOSABLE) ×2 IMPLANT
TOWEL OR 17X26 10 PK STRL BLUE (TOWEL DISPOSABLE) ×2 IMPLANT
TRAY FOLEY CATH 14FRSI W/METER (CATHETERS) ×1 IMPLANT
TUBE CONNECTING 12X1/4 (SUCTIONS) ×1 IMPLANT
WATER STERILE IRR 1000ML POUR (IV SOLUTION) ×2 IMPLANT

## 2012-04-20 NOTE — Transfer of Care (Signed)
Immediate Anesthesia Transfer of Care Note  Patient: Ray [name]  Procedure(s) Performed: Procedure(s) with comments: POSTERIOR CERVICAL FUSION/FORAMINOTOMY LEVEL 1 (N/A) - C1/2 laminectomies with posterior screw fixation  Patient Location: PACU  Anesthesia Type:General  Level of Consciousness: awake, alert  and oriented  Airway & Oxygen Therapy: Patient Spontanous Breathing and Patient connected to nasal cannula oxygen  Post-op Assessment: Report given to PACU RN and Post -op Vital signs reviewed and stable  Post vital signs: Reviewed and stable  Complications: No apparent anesthesia complications

## 2012-04-20 NOTE — Progress Notes (Signed)
Subjective:  The patient is somnolent but easily arousable. He looks well. He is in no apparent distress.  Objective: Vital signs in last 24 hours: Temp:  [97.3 F (36.3 C)-97.8 F (36.6 C)] 97.8 F (36.6 C) (03/17 1639) Pulse Rate:  [73-75] 73 (03/17 1645) Resp:  [18-20] 18 (03/17 1650) BP: (129-134)/(77-86) 134/77 mmHg (03/17 1645) SpO2:  [97 %-98 %] 98 % (03/17 1645)  Intake/Output from previous day:   Intake/Output this shift: Total I/O In: 1750 [I.V.:1750] Out: 275 [Urine:275]  Physical exam the patient is somnolent but easily arousable. He is moving all 4 extremities well. His dressing is clean and dry.  Lab Results: No results found for this basename: WBC, HGB, HCT, PLT,  in the last 72 hours BMET No results found for this basename: NA, K, CL, CO2, GLUCOSE, BUN, CREATININE, CALCIUM,  in the last 72 hours  Studies/Results: No results found.  Assessment/Plan: The patient is doing well.  LOS: 0 days     Myrtle Haller D 04/20/2012, 4:55 PM

## 2012-04-20 NOTE — Preoperative (Signed)
Beta Blockers   Reason not to administer Beta Blockers:Not Applicable 
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
## 2012-04-20 NOTE — Anesthesia Procedure Notes (Signed)
Procedure Name: Intubation Date/Time: 04/20/2012 1:29 PM Performed by: Orvilla Fus A Pre-anesthesia Checklist: Patient identified, Timeout performed, Emergency Drugs available, Suction available and Patient being monitored Patient Re-evaluated:Patient Re-evaluated prior to inductionOxygen Delivery Method: Circle system utilized Preoxygenation: Pre-oxygenation with 100% oxygen Intubation Type: IV induction Ventilation: Mask ventilation without difficulty Laryngoscope Size: Mac and 4 Grade View: Grade II Tube type: Oral Tube size: 8.0 mm Number of attempts: 1 Airway Equipment and Method: Stylet and LTA kit utilized Placement Confirmation: ETT inserted through vocal cords under direct vision,  breath sounds checked- equal and bilateral and positive ETCO2 Secured at: 24 (teeth) cm Tube secured with: Tape Dental Injury: Teeth and Oropharynx as per pre-operative assessment

## 2012-04-20 NOTE — Anesthesia Preprocedure Evaluation (Signed)
Anesthesia Evaluation  Patient identified by MRN, date of birth, ID band Patient awake    Reviewed: Allergy & Precautions, H&P , NPO status , Patient's Chart, lab work & pertinent test results  Airway Mallampati: I TM Distance: >3 FB Neck ROM: full    Dental   Pulmonary          Cardiovascular hypertension, Rhythm:regular Rate:Normal     Neuro/Psych  Neuromuscular disease    GI/Hepatic hiatal hernia, GERD-  ,  Endo/Other    Renal/GU      Musculoskeletal   Abdominal   Peds  Hematology   Anesthesia Other Findings   Reproductive/Obstetrics                           Anesthesia Physical Anesthesia Plan  ASA: II  Anesthesia Plan: General   Post-op Pain Management:    Induction: Intravenous  Airway Management Planned: Oral ETT  Additional Equipment:   Intra-op Plan:   Post-operative Plan: Extubation in OR  Informed Consent: I have reviewed the patients History and Physical, chart, labs and discussed the procedure including the risks, benefits and alternatives for the proposed anesthesia with the patient or authorized representative who has indicated his/her understanding and acceptance.     Plan Discussed with: CRNA, Anesthesiologist and Surgeon  Anesthesia Plan Comments:         Anesthesia Quick Evaluation

## 2012-04-21 ENCOUNTER — Encounter (HOSPITAL_COMMUNITY): Payer: Self-pay | Admitting: Neurosurgery

## 2012-04-21 MED ORDER — MORPHINE SULFATE 2 MG/ML IJ SOLN: 2.0000 mg | INTRAMUSCULAR | Status: DC | PRN

## 2012-04-21 MED FILL — Sodium Chloride IV Soln 0.9%: INTRAVENOUS | Qty: 1000 | Status: AC

## 2012-04-21 MED FILL — Heparin Sodium (Porcine) Inj 1000 Unit/ML: INTRAMUSCULAR | Qty: 30 | Status: AC

## 2012-04-21 NOTE — Progress Notes (Signed)
Patient ID: Ray [name], male   DOB: 11/18/1955, 57 y.o.   MRN: [id number] Subjective:  The patient is alert and pleasant. He looks well. His neck is appropriately sore.  Objective: Vital signs in last 24 hours: Temp:  [97.3 F (36.3 C)-98.3 F (36.8 C)] 97.8 F (36.6 C) (03/18 0600) Pulse Rate:  [52-79] 79 (03/18 0600) Resp:  [12-20] 18 (03/18 0600) BP: (129-149)/(75-87) 139/79 mmHg (03/18 0600) SpO2:  [97 %-100 %] 100 % (03/18 0600) Weight:  [98.431 kg (217 lb)] 98.431 kg (217 lb) (03/17 2100)  Intake/Output from previous day: 03/17 0701 - 03/18 0700 In: 1750 [I.V.:1750] Out: 675 [Urine:675] Intake/Output this shift:    Physical exam the patient is alert and oriented. His dressing is clean and dry. His strength is grossly normal.  Lab Results: No results found for this basename: WBC, HGB, HCT, PLT,  in the last 72 hours BMET No results found for this basename: NA, K, CL, CO2, GLUCOSE, BUN, CREATININE, CALCIUM,  in the last 72 hours  Studies/Results: Dg Cervical Spine 2-3 Views  04/20/2012  *RADIOLOGY REPORT*  Clinical Data: Posterior cervical fusion C1-2  CERVICAL SPINE - 2-3 VIEW,DG C-ARM GT 120 MIN  Comparison: Cervical MRI 03/26/2012  Findings: Bilateral screws are present from posterior approach passing through the inferior aspect of C2 into the lateral mass of C1 bilaterally.  Posterior cerclage wires around C1-C2 are noted.  IMPRESSION: Posterior fusion C1 and C2.   Original Report Authenticated By: Janeece Riggers, M.D.    Dg C-arm Gt 120 Min  04/20/2012  *RADIOLOGY REPORT*  Clinical Data: Posterior cervical fusion C1-2  CERVICAL SPINE - 2-3 VIEW,DG C-ARM GT 120 MIN  Comparison: Cervical MRI 03/26/2012  Findings: Bilateral screws are present from posterior approach passing through the inferior aspect of C2 into the lateral mass of C1 bilaterally.  Posterior cerclage wires around C1-C2 are noted.  IMPRESSION: Posterior fusion C1 and C2.   Original Report Authenticated By:  Janeece Riggers, M.D.     Assessment/Plan: Postop day #1: The patient is doing well. We will DC his PCA and mobilize him. He will likely go home tomorrow.  LOS: 1 day     Misa Fedorko D 04/21/2012, 8:06 AM

## 2012-04-21 NOTE — Progress Notes (Signed)
UR COMPLETED  

## 2012-04-21 NOTE — Anesthesia Postprocedure Evaluation (Deleted)
Anesthesia Post Note  Patient: Ray [name]  Procedure(s) Performed: Procedure(s) (LRB): POSTERIOR CERVICAL FUSION/FORAMINOTOMY LEVEL 1 (N/A)  Anesthesia type: general  Patient location: PACU  Post pain: Pain level controlled  Post assessment: Patient's Cardiovascular Status Stable  Last Vitals:  Filed Vitals:   04/21/12 0600  BP: 139/79  Pulse: 79  Temp: 36.6 C  Resp: 18    Post vital signs: Reviewed and stable  Level of consciousness: sedated  Complications: No apparent anesthesia complications

## 2012-04-21 NOTE — Anesthesia Postprocedure Evaluation (Signed)
  Anesthesia Post-op Note  Patient: Ray [name]  Procedure(s) Performed: Procedure(s) with comments: POSTERIOR CERVICAL FUSION/FORAMINOTOMY LEVEL 1 (N/A) - C1/2 laminectomies with posterior screw fixation  Patient Location: PACU  Anesthesia Type:General  Level of Consciousness: awake, alert  and oriented  Airway and Oxygen Therapy: Patient Spontanous Breathing and Patient connected to nasal cannula oxygen  Post-op Pain: mild  Post-op Assessment: Post-op Vital signs reviewed, Patient's Cardiovascular Status Stable, Respiratory Function Stable, Patent Airway and Pain level controlled  Post-op Vital Signs: stable  Complications: No apparent anesthesia complications

## 2012-04-22 NOTE — Progress Notes (Signed)
Patient ID: Ray [name], male   DOB: 17-Jun-1955, 57 y.o.   MRN: [id number] Subjective:  The patient is alert and pleasant. He looks well. He may want to go home tomorrow. I spoke with his wife.  Objective: Vital signs in last 24 hours: Temp:  [98.5 F (36.9 C)-99.4 F (37.4 C)] 99.4 F (37.4 C) (03/19 1504) Pulse Rate:  [69-85] 79 (03/19 1504) Resp:  [16-18] 17 (03/19 1504) BP: (133-159)/(79-93) 159/93 mmHg (03/19 1504) SpO2:  [93 %-98 %] 98 % (03/19 1504)  Intake/Output from previous day:   Intake/Output this shift:    Physical exam the patient is alert and oriented. He is moving all 4 extremities well. His dressing is clean and dry.  Lab Results: No results found for this basename: WBC, HGB, HCT, PLT,  in the last 72 hours BMET No results found for this basename: NA, K, CL, CO2, GLUCOSE, BUN, CREATININE, CALCIUM,  in the last 72 hours  Studies/Results: No results found.  Assessment/Plan: Postop day #2: The patient is doing well. He will likely go home tomorrow.  LOS: 2 days     Ray [name] D 04/22/2012, 5:09 PM

## 2012-04-22 NOTE — Progress Notes (Signed)
Patient ambulated in the hallway early morning with minimal assistance. Tolerated very well.

## 2012-04-23 NOTE — Progress Notes (Signed)
Patient ID: Ray [name], male   DOB: 1955/02/14, 57 y.o.   MRN: [id number] Subjective:  The patient is alert and pleasant. He looks well. He wants to go home tomorrow.  Objective: Vital signs in last 24 hours: Temp:  [98.4 F (36.9 C)-99.4 F (37.4 C)] 98.8 F (37.1 C) (03/20 1106) Pulse Rate:  [70-80] 71 (03/20 1106) Resp:  [17-18] 18 (03/20 1106) BP: (124-159)/(82-93) 128/85 mmHg (03/20 1106) SpO2:  [95 %-98 %] 97 % (03/20 1106)  Intake/Output from previous day:   Intake/Output this shift:    Physical exam the patient is alert and oriented. His wound is healing well without signs of infection. Her strength is grossly normal in all 4 extremities.  Lab Results: No results found for this basename: WBC, HGB, HCT, PLT,  in the last 72 hours BMET No results found for this basename: NA, K, CL, CO2, GLUCOSE, BUN, CREATININE, CALCIUM,  in the last 72 hours  Studies/Results: No results found.  Assessment/Plan: Postop day #3: The patient is doing well. He will likely go home tomorrow.  LOS: 3 days     Robbin Escher D 04/23/2012, 12:27 PM

## 2012-04-24 MED ORDER — DIAZEPAM 5 MG PO TABS: 5.0000 mg | ORAL_TABLET | Freq: Four times a day (QID) | ORAL | Status: DC | PRN

## 2012-04-24 MED ORDER — OXYCODONE-ACETAMINOPHEN 10-325 MG PO TABS: 1.0000 | ORAL_TABLET | ORAL | Status: DC | PRN

## 2012-04-24 MED ORDER — DSS 100 MG PO CAPS: 100.0000 mg | ORAL_CAPSULE | Freq: Two times a day (BID) | ORAL | Status: DC

## 2012-04-24 NOTE — Discharge Summary (Signed)
Physician Discharge Summary  Patient ID: Ray [name] MRN: [id number] DOB/AGE: [birth year] 57 y.o.  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 *   Discharged Condition: good  Hospital Course: I admitted the patient to [facility] on 04/20/2012. On that day I performed a C1-2 transarticular screw fixation with posterior sublaminar interspinous wiring and fusion. The surgery went well.  The patient's postoperative course was unremarkable and he requested discharge to home on 04/24/2012. The patient was given oral and written discharge instructions. All his questions were answered.  Consults: None Significant Diagnostic Studies: None Treatments: C1-2 transarticular screw fixation, C1 to sublaminar and interspinous wiring and fusion Discharge Exam: Blood pressure 142/89, pulse 84, temperature 99.1 F (37.3 C), temperature source Oral, resp. rate 20, height 6' (1.829 m), weight 98.431 kg (217 lb), SpO2 98.00%. The patient is alert and oriented. His strength is normal. His  Wound is healing well without signs of infection.  Disposition: Home  Discharge Orders   Future Appointments Provider Department Dept Phone   05/28/2012 10:30 AM Erick Colace[facility] [phone]   Future Orders Complete By Expires     Call MD for:  difficulty breathing, headache or visual disturbances  As directed     Call MD for:  extreme fatigue  As directed     Call MD for:  hives  As directed     Call MD for:  persistant dizziness or light-headedness  As directed     Call MD for:  persistant nausea and vomiting  As directed     Call MD for:  redness, tenderness, or signs of infection (pain, swelling, redness, odor or green/yellow discharge around incision site)  As directed     Call MD for:  severe uncontrolled pain  As  directed     Call MD for:  temperature >100.4  As directed     Diet - low sodium heart healthy  As directed     Discharge instructions  As directed     Comments:      Call [phone] for a followup appointment. Take a stool softener while you are using pain medications.    Driving Restrictions  As directed     Comments:      Do not drive for 2 weeks.    Increase activity slowly  As directed     Lifting restrictions  As directed     Comments:      Do not lift more than 5 pounds. No excessive bending or twisting.    May shower / Bathe  As directed     Comments:      He may shower after the pain she is removed 3 days after surgery. Leave the incision alone.    No dressing needed  As directed         Medication List    STOP taking these medications       FLEXERIL 10 MG tablet  Generic drug:  cyclobenzaprine     traMADol 50 MG tablet  Commonly known as:  ULTRAM      TAKE these medications       allopurinol 300 MG tablet  Commonly known as:  ZYLOPRIM  Take 1 tablet (300 mg total) by mouth daily.     amLODipine 10 MG tablet  Commonly known as:  NORVASC  Take 10 mg by mouth  daily before breakfast.     ANACIN 81 MG EC tablet  Generic drug:  aspirin  Take 81 mg by mouth daily.     diazepam 5 MG tablet  Commonly known as:  VALIUM  Take 1 tablet (5 mg total) by mouth every 6 (six) hours as needed.     DSS 100 MG Caps  Take 100 mg by mouth 2 (two) times daily.     DULoxetine 30 MG capsule  Commonly known as:  CYMBALTA  30 mg. Take 30mg  daily for one week, then increase to 60mg   Pt. Reports that he is holding it currently, it made him feel shaky.     losartan 100 MG tablet  Commonly known as:  COZAAR  Take 100 mg by mouth daily before breakfast.     MENS MULTIVITAMIN PLUS PO  Take by mouth daily.     omeprazole 40 MG capsule  Commonly known as:  PRILOSEC  Take 40 mg by mouth daily before breakfast.     oxyCODONE-acetaminophen 10-325 MG per tablet  Commonly  known as:  PERCOCET  Take 1 tablet by mouth every 4 (four) hours as needed for pain.     sildenafil 50 MG tablet  Commonly known as:  VIAGRA  Take 1 tablet (50 mg total) by mouth as needed.     sildenafil 50 MG tablet  Commonly known as:  VIAGRA  Take 50 mg by mouth daily as needed for erectile dysfunction.         SignedCristi Loron 04/24/2012, 3:14 PM

## 2012-05-04 ENCOUNTER — Other Ambulatory Visit: Payer: Self-pay | Admitting: Family Medicine

## 2012-05-14 ENCOUNTER — Other Ambulatory Visit: Payer: Self-pay | Admitting: Family Medicine

## 2012-05-28 ENCOUNTER — Ambulatory Visit: Admitting: Physical Medicine & Rehabilitation

## 2012-05-28 ENCOUNTER — Encounter

## 2012-05-31 ENCOUNTER — Other Ambulatory Visit: Payer: Self-pay | Admitting: Family Medicine

## 2012-06-15 ENCOUNTER — Encounter: Payer: Self-pay | Admitting: Family Medicine

## 2012-06-15 ENCOUNTER — Ambulatory Visit (INDEPENDENT_AMBULATORY_CARE_PROVIDER_SITE_OTHER): Admitting: Family Medicine

## 2012-06-15 VITALS — BP 143/87 | HR 69 | Ht 72.0 in | Wt 210.8 lb

## 2012-06-15 DIAGNOSIS — M542 Cervicalgia: Secondary | ICD-10-CM

## 2012-06-15 DIAGNOSIS — R51 Headache: Secondary | ICD-10-CM

## 2012-06-15 DIAGNOSIS — R519 Headache, unspecified: Secondary | ICD-10-CM | POA: Insufficient documentation

## 2012-06-15 MED ORDER — KETOROLAC TROMETHAMINE 60 MG/2ML IM SOLN
60.0000 mg | Freq: Once | INTRAMUSCULAR | Status: AC
  Administered 2012-06-15: 60 mg via INTRAMUSCULAR

## 2012-06-15 NOTE — Assessment & Plan Note (Signed)
Headaches likely tension related from post op spasm.  No signs of neurological deficits or infection that would be contributing to this.  He still has valium at home, advised he can try this for a few days to see if it is helpful.  Given toradol here in office, instructed to follow up if not improving or worsening.

## 2012-06-15 NOTE — Patient Instructions (Addendum)
Thank you for coming in today, it was good to see you I think your headaches are coming from muscle spasm Try the valium you have at home, this may be helpful If you are not getting any relief after a couple of days, call me and we can try something different. If this is still going on after a week I need to see you again.

## 2012-06-15 NOTE — Progress Notes (Signed)
  Subjective:    Patient ID: Ray [name], male    DOB: [birth year], 57 y.o.   MRN: [id number]  Headache     1.  Headache:  C/o headache off and on for the past few weeks, most recent episode began 3 days ago.   Had cervical instrumentation with fusion on 04/20/12.  Has been using ridgid cervical collar since that time.  Headache starts at base of neck and travels up the back of the head bilaterally. Denies any numbness, weakness, balance or gait instability, vision changes associated with headache.  No fever, chills or sweats.  Has valium prescribed for post op muscle spasm but has not been using this at all.  Also has percocet and may take 1/2 tablet occasionally.   Review of Systems  Neurological: Positive for headaches.   Per HPI    Objective:   Physical Exam  Constitutional: He is oriented to person, place, and time. He appears well-nourished. No distress.  Cervical collar in place   HENT:  Head: Normocephalic and atraumatic.  Eyes: EOM are normal. Pupils are equal, round, and reactive to light.  Neck:  Scar along posterior neck, well healed.  ROM limited in all directions, but no nuchal rigidity.   Hypertonicity of paraspinal cervical muscles bilaterally, with tenderness to palpation.  Reports his pain is right along these areas.    Neurological: He is alert and oriented to person, place, and time. No cranial nerve deficit. Gait (antalgic, walks with a cane which is baseline for him. ) abnormal.          Assessment & Plan:

## 2012-06-16 ENCOUNTER — Telehealth: Payer: Self-pay | Admitting: Family Medicine

## 2012-06-16 NOTE — Telephone Encounter (Signed)
Patient is calling with some questions that he would like to discuss with Dr. Ashley Royalty regarding his condition.

## 2012-06-16 NOTE — Telephone Encounter (Signed)
Called pt and he would like to speak with Dr.Matthews. I asked, if he could tell me what he would like to talk about. He just had OV yesterday. Pt reports, that he will see his attorney tomorrow and would like to speak with Dr.Matthews about his condition before he sees his attorney. Pt is aware that Dr.Matthews will be in clinic tomorrow. Please call pt. Thank you. Lorenda Hatchet, Renato Battles

## 2012-06-17 ENCOUNTER — Telehealth: Payer: Self-pay | Admitting: Family Medicine

## 2012-06-17 NOTE — Telephone Encounter (Signed)
Talked with Ray [name] and had question regarding if I had heard from his attorney about possible disability.  Explained that I had not heard anything or received anything in writing from them.  He will contact them.

## 2012-06-17 NOTE — Telephone Encounter (Signed)
Called number in chart and left VM to return call.

## 2012-06-18 ENCOUNTER — Encounter

## 2012-06-18 ENCOUNTER — Ambulatory Visit: Admitting: Physical Medicine & Rehabilitation

## 2012-07-13 ENCOUNTER — Encounter: Payer: Self-pay | Admitting: Family Medicine

## 2012-07-13 ENCOUNTER — Ambulatory Visit (INDEPENDENT_AMBULATORY_CARE_PROVIDER_SITE_OTHER): Admitting: Family Medicine

## 2012-07-13 VITALS — BP 144/98 | HR 94 | Ht 72.0 in | Wt 213.5 lb

## 2012-07-13 DIAGNOSIS — M4802 Spinal stenosis, cervical region: Secondary | ICD-10-CM

## 2012-07-13 DIAGNOSIS — M79604 Pain in right leg: Secondary | ICD-10-CM

## 2012-07-13 DIAGNOSIS — M5416 Radiculopathy, lumbar region: Secondary | ICD-10-CM

## 2012-07-13 DIAGNOSIS — IMO0002 Reserved for concepts with insufficient information to code with codable children: Secondary | ICD-10-CM

## 2012-07-13 DIAGNOSIS — M79609 Pain in unspecified limb: Secondary | ICD-10-CM

## 2012-07-13 MED ORDER — GABAPENTIN 300 MG PO CAPS: 300.0000 mg | ORAL_CAPSULE | Freq: Every day | ORAL | Status: DC

## 2012-07-13 NOTE — Assessment & Plan Note (Signed)
Currently on percocet, valium, and still has flexeril at home.  Will add on gabapentin at bedtime to help with his sleep and hopefull help augment some of his papin.  Follow up in two weeks, can increased gabapentin at that time if tolerating well.

## 2012-07-13 NOTE — Patient Instructions (Addendum)
Thank you for coming in today, it was good to see you I am starting a new medication called gabapentin.  Use this just at night for now, it may make you drowsy. Continue the cymbalta but start taking in the morning Follow up with me in two weeks so I can see how you are doing.

## 2012-07-13 NOTE — Progress Notes (Signed)
  Subjective:    Patient ID: Ray [name], male    DOB: 1955-06-21, 57 y.o.   MRN: [id number]  Back Pain    1. Back and neck pain:  Here with complaint of back and neck pain.  Fairly recent neck surgery, is doing fairly well but with intermittent pain.  His neurosurgeon has been prescribing diazepam and oxycodone which is helpful but he finds that he continues to wake up frequently at night with pain.  He has been seen by Dr. Doroteo Bradford at [facility], for epidural spine injections for his lumbar spine, but does not have follow up until he is seen by Dr. Lovell Sheehan again on 7/11.  The pain in his neck feels like muscle spasm while his lower back radiates into his leg and feels like electricity.  He denies fever, chills, headache.    Review of Systems  Musculoskeletal: Positive for back pain.   Per HPI    Objective:   Physical Exam  Constitutional: He appears well-nourished.  In neck brace   Neck:  ROM limited in all planes. Hypertonicity of cervical paraspinals.  Musculoskeletal:  Low back with no ttp.  SLR negative.  Reflexes intact.           Assessment & Plan:

## 2012-07-22 ENCOUNTER — Ambulatory Visit (INDEPENDENT_AMBULATORY_CARE_PROVIDER_SITE_OTHER): Admitting: Family Medicine

## 2012-07-22 ENCOUNTER — Encounter: Payer: Self-pay | Admitting: Family Medicine

## 2012-07-22 VITALS — BP 127/84 | HR 92 | Ht 72.0 in | Wt 208.0 lb

## 2012-07-22 DIAGNOSIS — G4700 Insomnia, unspecified: Secondary | ICD-10-CM

## 2012-07-22 DIAGNOSIS — M5416 Radiculopathy, lumbar region: Secondary | ICD-10-CM

## 2012-07-22 DIAGNOSIS — IMO0002 Reserved for concepts with insufficient information to code with codable children: Secondary | ICD-10-CM

## 2012-07-22 MED ORDER — GABAPENTIN 300 MG PO CAPS: 300.0000 mg | ORAL_CAPSULE | Freq: Three times a day (TID) | ORAL | Status: DC

## 2012-07-22 NOTE — Patient Instructions (Signed)
Thank you for coming in today, it was good to see you It has been a pleasure caring for you during my short time here. Increase your gabapentin to 300mg  three times per day.  Follow up in 2-3 weeks with your new primary doctor, Dr. Pollie Meyer or sooner as needed

## 2012-07-26 DIAGNOSIS — G4700 Insomnia, unspecified: Secondary | ICD-10-CM | POA: Insufficient documentation

## 2012-07-26 NOTE — Progress Notes (Signed)
  Subjective:    Patient ID: Ray [name], male    DOB: Jun 11, 1955, 57 y.o.   MRN: [id number]  HPI  Here for follow up of neck/back pain and insomnia.  Pain is somewhat better controlled since addition of gabapentin.  This has also helped with his insomnia at night.  He continues to use narcotic medications (1/2 tab BID) and valium (qhs) for pain control as well.  He tries to avoid these if at all possible.  He has follow up with Dr. Lovell Sheehan in a few weeks.  He does not feel oversedated with gabapentin.  Of note he has noticed some decrease in appetite.  He denies nausea or constipation.    Review of Systems Per HPI    Objective:   Physical Exam  Constitutional: He appears well-nourished. No distress.  HENT:  Head: Normocephalic.  Neck in brace, spasm in upper paracervicals, non tender.   Cardiovascular: Normal rate and regular rhythm.   Pulmonary/Chest: Effort normal and breath sounds normal.          Assessment & Plan:

## 2012-07-26 NOTE — Assessment & Plan Note (Signed)
Improved some with gabapentin, will increase to TID as he is tolerating well.

## 2012-07-26 NOTE — Assessment & Plan Note (Signed)
Has improved with gabapentin.

## 2012-09-04 ENCOUNTER — Encounter: Payer: Self-pay | Admitting: Family Medicine

## 2012-09-04 ENCOUNTER — Ambulatory Visit (INDEPENDENT_AMBULATORY_CARE_PROVIDER_SITE_OTHER): Admitting: Family Medicine

## 2012-09-04 VITALS — BP 136/89 | HR 87 | Ht 72.0 in | Wt 209.0 lb

## 2012-09-04 DIAGNOSIS — G8928 Other chronic postprocedural pain: Secondary | ICD-10-CM

## 2012-09-04 MED ORDER — OXYCODONE-ACETAMINOPHEN 10-325 MG PO TABS: 0.5000 | ORAL_TABLET | Freq: Two times a day (BID) | ORAL | Status: DC | PRN
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
## 2012-09-04 NOTE — Patient Instructions (Addendum)
It was nice to meet you today!  I refilled your oxycodone-acetaminophen. Call to get in with the neurosurgeon.  Schedule an appointment to come back in 3 months to follow up on your blood pressure. Come in sooner if you have any problems.  Be well, Dr. Pollie Meyer

## 2012-09-13 NOTE — Assessment & Plan Note (Signed)
Neck stiffness is musculoskeletal due to chronic post operative pain, no signs of illness or infection to suggest meningitis. Refilled oxycodone-acetaminophen for one month's worth. Encouraged him to call neurosurgeons office to get in for an appointment regarding his lasting chronic neck pain/stiffness, despite frustrations with scheduling difficulties in the past. Pt agrees and will call.

## 2012-09-17 ENCOUNTER — Other Ambulatory Visit: Payer: Self-pay | Admitting: Family Medicine

## 2012-09-25 ENCOUNTER — Other Ambulatory Visit: Payer: Self-pay | Admitting: Family Medicine

## 2012-10-12 ENCOUNTER — Other Ambulatory Visit: Payer: Self-pay | Admitting: Family Medicine

## 2012-10-12 MED ORDER — ALLOPURINOL 300 MG PO TABS: ORAL_TABLET | ORAL | Status: DC

## 2012-10-12 MED ORDER — OMEPRAZOLE 40 MG PO CPDR: DELAYED_RELEASE_CAPSULE | ORAL | Status: DC

## 2012-10-25 ENCOUNTER — Other Ambulatory Visit: Payer: Self-pay | Admitting: Family Medicine

## 2012-11-04 ENCOUNTER — Ambulatory Visit (INDEPENDENT_AMBULATORY_CARE_PROVIDER_SITE_OTHER): Admitting: Family Medicine

## 2012-11-04 ENCOUNTER — Encounter: Payer: Self-pay | Admitting: Family Medicine

## 2012-11-04 VITALS — BP 142/98 | HR 68 | Temp 98.8°F | Wt 207.0 lb

## 2012-11-04 DIAGNOSIS — I10 Essential (primary) hypertension: Secondary | ICD-10-CM

## 2012-11-04 DIAGNOSIS — Z23 Encounter for immunization: Secondary | ICD-10-CM

## 2012-11-04 DIAGNOSIS — G8928 Other chronic postprocedural pain: Secondary | ICD-10-CM

## 2012-11-04 MED ORDER — HYDROCODONE-ACETAMINOPHEN 5-325 MG PO TABS
1.0000 | ORAL_TABLET | Freq: Two times a day (BID) | ORAL | Status: DC | PRN
Start: 2012-11-04 — End: 2012-11-04

## 2012-11-04 MED ORDER — HYDROCODONE-ACETAMINOPHEN 5-325 MG PO TABS: 1.0000 | ORAL_TABLET | Freq: Two times a day (BID) | ORAL | Status: DC | PRN

## 2012-11-04 NOTE — Assessment & Plan Note (Signed)
Blood pressure elevated today despite compliance with medication. As he has historically been well controlled on this regimen, will have him return for nurse visit in 2 weeks to recheck blood pressure. If still elevated at that time may require additional medication.

## 2012-11-04 NOTE — Patient Instructions (Signed)
It was great to see you again today!  I refilled two months worth of the hydrocodone for you. Please use this only as needed, up to twice per day.  Follow up with me in 2 months on pain, to see if we are able to decrease the dose or frequency of this medicine. Come back in 2 weeks for a nurse blood pressure check, to see if your BP is still high. If it is still high at that time we may need to change your medicines.  Be well, Dr. Pollie Meyer
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
## 2012-11-04 NOTE — Assessment & Plan Note (Addendum)
Pain increased recently due to patient doing physical therapy. Will refill hydrocodone for 2 months worth. Instructed him to continue his physical therapy and if the weakness and tingling in his left fingers is not improving he should follow up with Dr. Lovell Sheehan sooner than previously scheduled. He agrees to this plan. Advised that he minimize narcotic use as much as possible, he is agreeable to this plan. Followup with me in 2 months.

## 2012-11-17 ENCOUNTER — Ambulatory Visit (INDEPENDENT_AMBULATORY_CARE_PROVIDER_SITE_OTHER): Admitting: *Deleted

## 2012-11-17 DIAGNOSIS — I10 Essential (primary) hypertension: Secondary | ICD-10-CM

## 2012-11-17 NOTE — Progress Notes (Signed)
Patient in today for blood pressure check. Blood pressure taken manually in left arm read 132/88. Patient states at last OV he had forgotten to take blood pressure medicine for 2 days but since then has been regularly taking medicine. Will forward to MD for review.

## 2013-02-12 ENCOUNTER — Other Ambulatory Visit: Payer: Self-pay | Admitting: Family Medicine

## 2013-02-16 ENCOUNTER — Other Ambulatory Visit: Payer: Self-pay | Admitting: Family Medicine

## 2013-03-05 ENCOUNTER — Other Ambulatory Visit: Payer: Self-pay | Admitting: Family Medicine

## 2013-04-21 ENCOUNTER — Ambulatory Visit (INDEPENDENT_AMBULATORY_CARE_PROVIDER_SITE_OTHER): Admitting: Family Medicine

## 2013-04-21 ENCOUNTER — Encounter: Payer: Self-pay | Admitting: Family Medicine

## 2013-04-21 VITALS — BP 137/87 | HR 73 | Temp 98.5°F | Ht 72.0 in | Wt 216.0 lb

## 2013-04-21 DIAGNOSIS — M545 Low back pain, unspecified: Secondary | ICD-10-CM

## 2013-04-21 DIAGNOSIS — M5380 Other specified dorsopathies, site unspecified: Secondary | ICD-10-CM

## 2013-04-21 DIAGNOSIS — M62830 Muscle spasm of back: Secondary | ICD-10-CM

## 2013-04-21 DIAGNOSIS — K219 Gastro-esophageal reflux disease without esophagitis: Secondary | ICD-10-CM

## 2013-04-21 DIAGNOSIS — K5900 Constipation, unspecified: Secondary | ICD-10-CM

## 2013-04-21 MED ORDER — CYCLOBENZAPRINE HCL 10 MG PO TABS: 10.0000 mg | ORAL_TABLET | Freq: Three times a day (TID) | ORAL | Status: DC | PRN

## 2013-04-21 NOTE — Assessment & Plan Note (Signed)
Per history, with apparent hx of frank reflux esophagitis, per problem list. Currently on omeprazole 40 mg; possible contribution to abdominal pain. Continue omeprazole, with increase to two 40 mg tablets daily for 1 week, then resume 1. F/u as needed.

## 2013-04-21 NOTE — Patient Instructions (Signed)
Thank you for coming in, today!  I think your pain is mostly from muscle spasm and abdominal wall pain. You can continue to take hydrocodone as needed for pain. I will perscribe Flexeril for muscle spasm, which should help the back pain probably more than the abdominal pain.  I want you to take Miralax over the counter as directed on the label for the next week at least. Keep taking your Colace without change. You can get that over the counter as well. For the next week, take TWO omeprazole tablets a day, instead of one.  Come back to see Dr. [name] in about 2 weeks, or sooner if you're not feeling better. Please feel free to call with any questions or concerns at any time, at [phone]. --Dr. Venetia Maxon

## 2013-04-21 NOTE — Assessment & Plan Note (Signed)
Marked on exam, L > R in lumbar / low-thoracic region paraspinally. Rx for Flexeril and suggested ice / heat, and gentle stretching exercise. F/u with PCP in 2 weeks; per previous notes, pt has had similar issues in the past and has seen PT, which may be helpful if conservative measures do not help.

## 2013-04-21 NOTE — Assessment & Plan Note (Signed)
Likely secondary to narcotic use without use of bowel regimen medications as per HPI. Strongly recommended continuing Colace and adding Miralax. F/u as needed.
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
## 2013-04-21 NOTE — Assessment & Plan Note (Signed)
A: Acute-on-chronic MSK lumbar pain, with decent pain control with previously-prescribed narcotics. No obvious red flags in history or on exam, though marked component of muscle spasm likely contributing to pain, after acute GI illness. Also some possible GERD and/or constipation component to abdominal wall pain.  P: Continue Norco, with Rx for Flexeril (used in the past with decent results). Recommended continued use of Colace and adding Miralax as well. See other problem list notes. F/u with PCP Dr. [name] in about 2 weeks or sooner if needed.

## 2013-04-27 ENCOUNTER — Encounter: Payer: Self-pay | Admitting: Family Medicine

## 2013-04-27 ENCOUNTER — Ambulatory Visit (INDEPENDENT_AMBULATORY_CARE_PROVIDER_SITE_OTHER): Admitting: Family Medicine

## 2013-04-27 VITALS — BP 137/95 | HR 79 | Temp 98.6°F | Ht 72.0 in | Wt 212.0 lb

## 2013-04-27 DIAGNOSIS — K219 Gastro-esophageal reflux disease without esophagitis: Secondary | ICD-10-CM

## 2013-04-27 DIAGNOSIS — K5900 Constipation, unspecified: Secondary | ICD-10-CM

## 2013-04-27 DIAGNOSIS — R109 Unspecified abdominal pain: Secondary | ICD-10-CM

## 2013-04-27 MED ORDER — FAMOTIDINE 20 MG PO TABS: 20.0000 mg | ORAL_TABLET | Freq: Two times a day (BID) | ORAL | Status: DC

## 2013-04-27 NOTE — Progress Notes (Signed)
Patient ID: Ray [name], male   DOB: Jul 07, 1955, 58 y.o.   MRN: [id number]  S: 58 y.o. M with hx of dry heaves 2 weeks ago. Pt was really sore in back following heaving. Pt had some left abdominal soreness. Seen and attributed to heaving. Pt received flexeril. Pt is also on prilosec 40mg  and increased to 80mg .   Since that time Pt states that the pain has migrated to epigastric. Reports as a 6/10 dull spasm. Increase in prilosec did not improve symptoms. Nothing improves or worsens the pain. Pt reports decreased appetite since onset. No recurrent emesis. No diarrhea. On narcotics and hx of constipation and straining with BMs.   No fevers, +chills Sunday, since resolved. No headache, no cp, no sob, +/- nausea. No urinary symptoms. All other pain at baseline.  O: Filed Vitals:   04/27/13 1113  BP: 137/95  Pulse: 79  Temp: 98.6 F (37 C)   Mildly elevated BP, on BP meds and in acute pain NAD Nondistended. No focal tenderness on abdominal exam. No rebound, neg percussion. No epdigastric tenderness, NEg murphys sign. No evidence of hernia. No hepatosplenomegaly No focal back tenderness. No c/c/e  A/P: 58 y.o. m here for reevaluation for abdominal pain. No obvious etiology on ROS and history. PE non contributory at this time. Treat constipation and increase GERD vs PUD regimen. Follow up in 7-10 days for reevlatuion. - Pepcid 20mg  BID - Miralax - titrate to regular soft BMs - f/u 7-10d for reevaluation after resolution of constipation and reeval on increased GERD protection. - Pt is uptodate on colonoscopy - if no improvement, would screen with cbc, cmp, lipase, and Upright CXRay eval for free air. May consider evaluation for PUD with H. Pylori stool test. If no obvious etiology, consider GI referral for endoscopy.  Based on benign appearance doubt severe etiology at this time. Pt without severe pain, low suspicion ofr biliary disease, appendicitis. Bowel obstruction, AAA, mesenteric  ishcehema, pancreatisits or diverticulitis.    Fredrik Rigger, MD OB Fellow

## 2013-05-11 ENCOUNTER — Ambulatory Visit (INDEPENDENT_AMBULATORY_CARE_PROVIDER_SITE_OTHER): Admitting: Family Medicine

## 2013-05-11 ENCOUNTER — Encounter: Payer: Self-pay | Admitting: Family Medicine

## 2013-05-11 VITALS — BP 110/78 | HR 84 | Ht 72.0 in | Wt 210.0 lb

## 2013-05-11 DIAGNOSIS — M545 Low back pain, unspecified: Secondary | ICD-10-CM

## 2013-05-11 DIAGNOSIS — K219 Gastro-esophageal reflux disease without esophagitis: Secondary | ICD-10-CM

## 2013-05-11 DIAGNOSIS — K5900 Constipation, unspecified: Secondary | ICD-10-CM

## 2013-05-11 LAB — POCT URINALYSIS DIPSTICK
Bilirubin, UA: NEGATIVE
GLUCOSE UA: NEGATIVE
LEUKOCYTES UA: NEGATIVE
NITRITE UA: NEGATIVE
RBC UA: NEGATIVE
Spec Grav, UA: 1.025
UROBILINOGEN UA: 1
pH, UA: 5.5

## 2013-05-11 NOTE — Progress Notes (Signed)
Subjective:     Patient ID: Ray [name], male   DOB: May 18, 1955, 58 y.o.   MRN: [id number]  HPI 58 y.o. M here for follow up for abdominal pain after dry heaves approx 1 month ago associated with left back pain. The abdominal pain has since improved from a 6-2/10 pain. Dull ache in left lower back 6/10 lower back. Norco helps some but minimal improvement. Occasionally left leg will fall asleep but that is consistent with chronic low back/sciatic nerve. No numbness or weakness different from baseline. No issues with urination. Improves some with heat.  Patient appetite is slowly returning.   Abdominal pain improved with max dose prilosec 80mg . Pt did not start pepcid from last visit.   Constipation: pt took miralax without significant impact. Had small BMs. Still constipated.    Review of Systems No f/c, no CP, no n/v, otherwise in normal state of health    Objective:   Physical Exam Filed Vitals:   05/11/13 0948  BP: 110/78  Pulse: 84  VSS NAD INSPECTION Gait: at baseline  Observation:  Normal; Obese .    PALPATION Tone:  Normal Tenderness: Left paraspinous area at level of L1-4.  No TTP of spinous processes (cervical to sacral). No SI joint pain. Minimal left CVAT. Trigger points: None   RANGE OF MOTION At base line Forward bending:  symmetric/normal.  Extension: normal.   STRENGTH TESTING Motor: Globally  - 5  / 5   SPECIAL TESTS Straight Leg Raise: Negative FABER: negative.  No abdiminal tenderness     Assessment:     58 y.o. M for follow up for constipation, back pain and abdominal pain.   #Abd pain: improved - likely gastritis from heaving. Continue high dose prilosec and decrease in 2-4 weeks back to baseline. If no improvement. Consider H. Pylori eval  #back pain: likely pulled muscle from heaving with chronic pain. Pt is sedentary and will benefit from core strength and mobility. referal to PT. Neg UA - unlikely kidney or urine related  #Constipation:  likely related to poor PO intake, improving. Recommend retrial of miralax, increase fiber and activty. Inc water intake. Monitor. If no improvement. Consider more aggressive intervention.  Fredrik Rigger, MD OB Fellow

## 2013-05-11 NOTE — Patient Instructions (Signed)

## 2013-05-12 ENCOUNTER — Ambulatory Visit: Admitting: Family Medicine

## 2013-05-13 ENCOUNTER — Ambulatory Visit: Admitting: Family Medicine

## 2013-05-16 ENCOUNTER — Other Ambulatory Visit: Payer: Self-pay | Admitting: Family Medicine

## 2013-05-16 ENCOUNTER — Observation Stay (HOSPITAL_COMMUNITY)
Admission: EM | Admit: 2013-05-16 | Discharge: 2013-05-18 | Disposition: A | Discharge status: Home / Self Care | Attending: Family Medicine | Admitting: Family Medicine

## 2013-05-16 ENCOUNTER — Emergency Department (HOSPITAL_COMMUNITY)

## 2013-05-16 ENCOUNTER — Encounter (HOSPITAL_COMMUNITY): Payer: Self-pay | Admitting: Emergency Medicine

## 2013-05-16 DIAGNOSIS — I82890 Acute embolism and thrombosis of other specified veins: Secondary | ICD-10-CM | POA: Diagnosis present

## 2013-05-16 DIAGNOSIS — K8689 Other specified diseases of pancreas: Secondary | ICD-10-CM | POA: Insufficient documentation

## 2013-05-16 DIAGNOSIS — R070 Pain in throat: Secondary | ICD-10-CM | POA: Insufficient documentation

## 2013-05-16 DIAGNOSIS — G4700 Insomnia, unspecified: Secondary | ICD-10-CM | POA: Insufficient documentation

## 2013-05-16 DIAGNOSIS — N529 Male erectile dysfunction, unspecified: Secondary | ICD-10-CM | POA: Insufficient documentation

## 2013-05-16 DIAGNOSIS — E663 Overweight: Secondary | ICD-10-CM | POA: Insufficient documentation

## 2013-05-16 DIAGNOSIS — F17200 Nicotine dependence, unspecified, uncomplicated: Secondary | ICD-10-CM | POA: Insufficient documentation

## 2013-05-16 DIAGNOSIS — K2100 Gastro-esophageal reflux disease with esophagitis, without bleeding: Secondary | ICD-10-CM

## 2013-05-16 DIAGNOSIS — I10 Essential (primary) hypertension: Secondary | ICD-10-CM | POA: Insufficient documentation

## 2013-05-16 DIAGNOSIS — M109 Gout, unspecified: Secondary | ICD-10-CM

## 2013-05-16 DIAGNOSIS — K219 Gastro-esophageal reflux disease without esophagitis: Secondary | ICD-10-CM

## 2013-05-16 DIAGNOSIS — I7300 Raynaud's syndrome without gangrene: Secondary | ICD-10-CM | POA: Insufficient documentation

## 2013-05-16 DIAGNOSIS — M533 Sacrococcygeal disorders, not elsewhere classified: Secondary | ICD-10-CM | POA: Insufficient documentation

## 2013-05-16 DIAGNOSIS — D7389 Other diseases of spleen: Principal | ICD-10-CM | POA: Insufficient documentation

## 2013-05-16 DIAGNOSIS — M4802 Spinal stenosis, cervical region: Secondary | ICD-10-CM | POA: Insufficient documentation

## 2013-05-16 LAB — CBC WITH DIFFERENTIAL/PLATELET
BASOS ABS: 0 10*3/uL (ref 0.0–0.1)
Basophils Relative: 0 % (ref 0–1)
EOS PCT: 1 % (ref 0–5)
Eosinophils Absolute: 0 10*3/uL (ref 0.0–0.7)
HEMATOCRIT: 45.1 % (ref 39.0–52.0)
Hemoglobin: 15.9 g/dL (ref 13.0–17.0)
LYMPHS ABS: 1.8 10*3/uL (ref 0.7–4.0)
Lymphocytes Relative: 27 % (ref 12–46)
MCH: 29.2 pg (ref 26.0–34.0)
MCHC: 35.3 g/dL (ref 30.0–36.0)
MCV: 82.8 fL (ref 78.0–100.0)
Monocytes Absolute: 0.4 10*3/uL (ref 0.1–1.0)
Monocytes Relative: 6 % (ref 3–12)
NEUTROS ABS: 4.4 10*3/uL (ref 1.7–7.7)
Neutrophils Relative %: 66 % (ref 43–77)
PLATELETS: 206 10*3/uL (ref 150–400)
RBC: 5.45 MIL/uL (ref 4.22–5.81)
RDW: 14.1 % (ref 11.5–15.5)
WBC: 6.7 10*3/uL (ref 4.0–10.5)

## 2013-05-16 LAB — COMPREHENSIVE METABOLIC PANEL
ALBUMIN: 4.3 g/dL (ref 3.5–5.2)
ALK PHOS: 105 U/L (ref 39–117)
ALT: 13 U/L (ref 0–53)
AST: 13 U/L (ref 0–37)
BUN: 11 mg/dL (ref 6–23)
CO2: 23 mEq/L (ref 19–32)
CREATININE: 1.02 mg/dL (ref 0.50–1.35)
Calcium: 9.4 mg/dL (ref 8.4–10.5)
Chloride: 100 mEq/L (ref 96–112)
GFR calc Af Amer: >90 mL/min (ref >90)
GFR calc non Af Amer: 79 mL/min — ABNORMAL LOW (ref >90)
Glucose, Bld: 112 mg/dL — ABNORMAL HIGH (ref 70–99)
Potassium: 4.1 mEq/L (ref 3.7–5.3)
Sodium: 141 mEq/L (ref 137–147)
TOTAL PROTEIN: 8 g/dL (ref 6.0–8.3)
Total Bilirubin: 0.4 mg/dL (ref 0.3–1.2)

## 2013-05-16 LAB — URINALYSIS, ROUTINE W REFLEX MICROSCOPIC
Glucose, UA: NEGATIVE mg/dL
Hgb urine dipstick: NEGATIVE
Ketones, ur: 15 mg/dL — AB
Leukocytes, UA: NEGATIVE
Nitrite: NEGATIVE
Protein, ur: NEGATIVE mg/dL
Specific Gravity, Urine: 1.027 (ref 1.005–1.030)
Urobilinogen, UA: 1 mg/dL (ref 0.0–1.0)
pH: 6 (ref 5.0–8.0)

## 2013-05-16 LAB — LIPASE, BLOOD: Lipase: 11 U/L (ref 11–59)

## 2013-05-16 MED ORDER — MORPHINE SULFATE 4 MG/ML IJ SOLN
4.0000 mg | Freq: Once | INTRAMUSCULAR | Status: AC
  Administered 2013-05-16: 4 mg via INTRAVENOUS
  Filled 2013-05-16: qty 1

## 2013-05-16 MED ORDER — SODIUM CHLORIDE 0.9 % IV SOLN
INTRAVENOUS | Status: DC
  Administered 2013-05-16: 19:00:00 via INTRAVENOUS

## 2013-05-16 MED ORDER — ONDANSETRON HCL 4 MG/2ML IJ SOLN: 4.0000 mg | Freq: Four times a day (QID) | INTRAMUSCULAR | Status: DC | PRN

## 2013-05-16 MED ORDER — ONDANSETRON HCL 4 MG/2ML IJ SOLN
4.0000 mg | Freq: Once | INTRAMUSCULAR | Status: AC
  Administered 2013-05-16: 4 mg via INTRAVENOUS
  Filled 2013-05-16: qty 2

## 2013-05-16 MED ORDER — IOHEXOL 300 MG/ML  SOLN
50.0000 mL | Freq: Once | INTRAMUSCULAR | Status: AC | PRN
  Administered 2013-05-16: 50 mL via ORAL

## 2013-05-16 MED ORDER — DOCUSATE SODIUM 100 MG PO CAPS
100.0000 mg | ORAL_CAPSULE | Freq: Two times a day (BID) | ORAL | Status: DC
  Administered 2013-05-16 – 2013-05-18 (×4): 100 mg via ORAL
  Filled 2013-05-16 (×4): qty 1

## 2013-05-16 MED ORDER — LOSARTAN POTASSIUM 50 MG PO TABS
100.0000 mg | ORAL_TABLET | Freq: Every day | ORAL | Status: DC
  Administered 2013-05-17 – 2013-05-18 (×2): 100 mg via ORAL
  Filled 2013-05-16 (×2): qty 2

## 2013-05-16 MED ORDER — HYDROMORPHONE HCL PF 1 MG/ML IJ SOLN
1.0000 mg | INTRAMUSCULAR | Status: DC | PRN
  Administered 2013-05-16 – 2013-05-17 (×5): 1 mg via INTRAVENOUS
  Filled 2013-05-16 (×5): qty 1

## 2013-05-16 MED ORDER — ALLOPURINOL 300 MG PO TABS
300.0000 mg | ORAL_TABLET | Freq: Every day | ORAL | Status: DC
  Administered 2013-05-16 – 2013-05-18 (×3): 300 mg via ORAL
  Filled 2013-05-16 (×3): qty 1

## 2013-05-16 MED ORDER — PANTOPRAZOLE SODIUM 40 MG PO TBEC
40.0000 mg | DELAYED_RELEASE_TABLET | Freq: Every day | ORAL | Status: DC
  Administered 2013-05-16 – 2013-05-18 (×3): 40 mg via ORAL
  Filled 2013-05-16 (×3): qty 1

## 2013-05-16 MED ORDER — AMLODIPINE BESYLATE 10 MG PO TABS
10.0000 mg | ORAL_TABLET | Freq: Every day | ORAL | Status: DC
  Administered 2013-05-17 – 2013-05-18 (×2): 10 mg via ORAL
  Filled 2013-05-16 (×2): qty 1

## 2013-05-16 MED ORDER — ENOXAPARIN SODIUM 100 MG/ML ~~LOC~~ SOLN
90.0000 mg | Freq: Two times a day (BID) | SUBCUTANEOUS | Status: DC
  Administered 2013-05-16 – 2013-05-17 (×2): 90 mg via SUBCUTANEOUS
  Filled 2013-05-16 (×4): qty 1

## 2013-05-16 MED ORDER — IOHEXOL 300 MG/ML  SOLN
100.0000 mL | Freq: Once | INTRAMUSCULAR | Status: AC | PRN
  Administered 2013-05-16: 100 mL via INTRAVENOUS

## 2013-05-16 MED ORDER — ONDANSETRON HCL 4 MG PO TABS: 4.0000 mg | ORAL_TABLET | Freq: Four times a day (QID) | ORAL | Status: DC | PRN

## 2013-05-16 NOTE — H&P (Signed)
[facility] Admission History and Physical Service Pager: [phone]  Patient name: Ray [name] Medical record number: [id number] Date of birth: 1955/08/02 Age: 58 y.o. Gender: male  Primary Care Provider: Chrisandra Netters, MD Consultants: none Code Status: DNR  Chief Complaint: abdominal pain  Assessment and Plan: Ray [name] is a 58 y.o. male presenting with LUQ abdominal pain not relieved by home medications. PMH is significant for HTN, reflux esophagitis, chronic back pain.  # GI # Abdominal pain: patient with continued abdominal pain despite recent increases in PPI therapy. 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 Note patient with atrophy in the pancreas and liver cysts seen on CT. 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 No known history of clots. Likely is a chronic issue given collaterals that have already formed. No evidence of splenic infarction on CT. Differential for causes include hypercoagulable state vs chronic pancreatitis (potential cause as the inflammation associated with this can contribute to the thrombosis). Patient is currently hemodynamically stable. -will admit to med-surg, Attending Dr McDiarmid -will start on lovenox for anticoagulation with consideration of transition to an oral agent -will treat abdominal pain with dilaudid 1 mg q2 hr prn, consider addition of PO agent if this is not beneficial -will obtain MRCP abdomen to further characterize pancreatic atrophy and duct dilatation and liver lesions -monitor vitals per unit protocol -will place on  protonix -order FOBT given epigastric abdominal pain -zofran for nausea -clear liquid diet and advance as tolerated -colace for constipation -consider hypercoagulable work-up, though is a first time event -monitor LFTs for elevation on CMET tomorrow morning  # CV: # HTN: well controlled on admission -will continue home amlodipine and losartan  # MSK: # Gout: will continue home allopurinol  FEN/GI: KVO, clear liquid diet, if unable to take improved PO may need to consider addition of IVF Prophylaxis: full dose lovenox  Disposition: admit for anticoagulation and imaging, discharge pending pain control and completion of work-up  History of Present Illness: Ray [name] is a 58 y.o. male presenting with LUQ abdominal pain.  Patient notes for the past month has had abdominal pain. States one month ago he woke up with dry heaves then got sore in his mid section (starting epigastrically then in the upper half of his abdomen). He continued to have pain so he was seen in clinic and and felt to have constipation and GERD, so was treated with miralax and increased dose of omeprazole. He followed up about a week later with continued pain and states they increased his omeprazole again. He continued to have pain, though was much improved at his 3rd office visit and was felt to have had a likely gastritis and advised to continue high dose omeprazole. He states since then his pain has not gotten better and is present all the time. It has now become localized to the LUQ. He has had no appetite over this time and reports a 13 lb weight loss over the past month. Describes the pain as a dull ache with intermittent twinges. He denies vomiting and diarrhea. No bloody BMs. Lying on his stomach makes it worse. He has been taking vicodin  10/325 1-2x/day for this and tramadol as well without any benefit. He denies history of blood clots or abdominal pain. He notes previously heavy alcohol history and illicit drug  history outline in social history section. He denies chest pain, dyspnea, dysuria, and trouble with urination.  In the ED he was given morphine 4 mg x2 doses without alleviation of his pain. He was given zofran for nausea. He had a CT abd/pelvis that revealed a splenic vein thrombosis as outline in the radiology section below.  Review Of Systems: Per HPI with the following additions: none Otherwise 12 point review of systems was performed and was unremarkable.  Patient Active Problem List   Diagnosis Date Noted  . Muscle spasm of back 04/21/2013  . Unspecified constipation 04/21/2013  . GERD (gastroesophageal reflux disease) 04/21/2013  . Insomnia 07/26/2012  . Headache(784.0) 06/15/2012  . Chronic postoperative pain 04/14/2012  . Spinal stenosis in cervical region 04/14/2012  . Chronic left sacroiliac joint pain 04/14/2012  . Right lumbar radiculitis 04/14/2012  . Abnormal MRI, lumbar spine 02/17/2012  . Low back pain 12/27/2011  . 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 Overweight 01/21/2011  . Right leg pain 10/21/2010  . HYPERGLYCEMIA, FASTING 01/22/2010  . URI 01/31/2009  . UNSPECIFIED ANEMIA 07/05/2008  . HIP FRACTURE, RIGHT 07/05/2008  . HEMARTHROSIS 11/11/2007  . GANGLION, TENDON SHEATH 09/30/2006  . GOUT, ACUTE 04/03/2006  . HYPERTENSION, BENIGN SYSTEMIC 04/03/2006  . RAYNAUDS SYNDROME 04/03/2006  . REFLUX ESOPHAGITIS 04/03/2006  . Dizziness and Giddiness 04/03/2006   Past Medical History: Past Medical History  Diagnosis Date  . Blood transfusion   . GERD (gastroesophageal reflux disease)   . Hyperlipidemia   . Hypertension   . H/O hiatal hernia   . Arthritis     in his back, hips, knees, R shoulder    Past Surgical History: Past Surgical History  Procedure Laterality Date  . Hip surgery      x2 right hip  . Shoulder surgery      right  . Hand surgery      left thumb tendon repair  . Femur hardware removal  2011    R femur- prothesis  removed & replaced  . Posterior cervical fusion/foraminotomy N/A 04/20/2012    Procedure: POSTERIOR CERVICAL FUSION/FORAMINOTOMY LEVEL 1;  Surgeon: Ophelia Charter, MD;  Location: [facility];  Service: Neurosurgery;  Laterality: N/A;  C1/2 laminectomies with posterior screw fixation   Social History: History  Substance Use Topics  . Smoking status: Current Every Day Smoker    Types: Cigars  . Smokeless tobacco: Never Used     Comment: one a day  . 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 Smoked cigarettes 1 pack per week for 20-30 years.  Please also refer to relevant sections of EMR.  Family History: Family History  Problem Relation Age of Onset  . Colon cancer Neg Hx   . Esophageal cancer Neg Hx   . Stomach cancer Neg Hx   . Rectal cancer Neg Hx    Allergies and Medications: No Known Allergies No current facility-administered medications on file prior to encounter.   Current Outpatient Prescriptions on File Prior to Encounter  Medication Sig Dispense Refill  .  aspirin (ANACIN) 81 MG EC tablet Take 81 mg by mouth daily.        . cyclobenzaprine (FLEXERIL) 10 MG tablet Take 1 tablet (10 mg total) by mouth 3 (three) times daily as needed for muscle spasms.  30 tablet  0  . docusate sodium 100 MG CAPS Take 100 mg by mouth 2 (two) times daily.  60 capsule  1  . HYDROcodone-acetaminophen (NORCO/VICODIN) 5-325 MG per tablet Take 1 tablet by mouth 2 (two) times daily as needed for pain. Do not fill before 12/05/2012  60 tablet  0    Objective: BP 136/93  Pulse 61  Temp(Src) 98 F (36.7 C) (Oral)  Resp 16  Ht 6' (1.829 m)  Wt 202 lb (91.627 kg)  BMI 27.39 kg/m2  SpO2 97% Exam: General: NAD, resting comfortably in bed HEENT:  NCAT, PERRL, MMM Cardiovascular: rrr, no murmurs appreciated Respiratory: CTAB, no wheezes or crackles Abdomen: s, tender to palpation in the LUQ, non-tender elsewhere, no guarding or rebound, no masses palpated Extremities: no edema, WWP Skin: no lesions Neuro: alert, no focal deficits  Labs and Imaging: CBC    Recent Labs Lab 05/16/13 1215  WBC 6.7  HGB 15.9  HCT 45.1  PLT 206      CMP     Component Value Date/Time   NA 141 05/16/2013 1215   K 4.1 05/16/2013 1215   CL 100 05/16/2013 1215   CO2 23 05/16/2013 1215   GLUCOSE 112* 05/16/2013 1215   BUN 11 05/16/2013 1215   CREATININE 1.02 05/16/2013 1215   CREATININE 1.09 01/21/2011 1042   CALCIUM 9.4 05/16/2013 1215   PROT 8.0 05/16/2013 1215   ALBUMIN 4.3 05/16/2013 1215   AST 13 05/16/2013 1215   ALT 13 05/16/2013 1215   ALKPHOS 105 05/16/2013 1215   BILITOT 0.4 05/16/2013 1215   GFRNONAA 79* 05/16/2013 1215   GFRAA >90 05/16/2013 1215   Lipase 11  Ct Abdomen Pelvis W Contrast  05/16/2013    IMPRESSION: 1. 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 There are prominent splenic collaterals draining into the perigastric veins. 2. There is atrophy of the mid and distal body and tail of the pancreas without a discrete mass. There is also dilation of the pancreatic duct in this region without obvious etiology. 3. The liver demonstrates presence of tiny cysts and also areas of early enhancement which is a nonspecific finding and may reflect flash filling hemangiomas. Other pathology is not excluded. 4. The gallbladder is mildly distended without evidence of stones or acute inflammation. 5. There is no acute bowel abnormality nor acute urinary tract abnormality. 6. A pancreatic protocol MRI is recommended to exclude an occult mass. This would also allow further evaluation of the liver.    Leone Haven, MD 05/16/2013, 4:02 PM PGY-2, [facility]  Intern pager: [phone], text pages welcome

## 2013-05-16 NOTE — ED Notes (Signed)
Internal medicine at bedside

## 2013-05-16 NOTE — Progress Notes (Signed)
ANTICOAGULATION CONSULT NOTE - Initial Consult  Pharmacy Consult:  Lovenox Indication:  Splenic vein thrombus  No Known Allergies  Patient Measurements: Height: 6' (182.9 cm) Weight: 202 lb (91.627 kg) IBW/kg (Calculated) : 77.6  Vital Signs: Temp: 98 F (36.7 C) (04/12 1559) Temp src: Oral (04/12 1559) BP: 133/86 mmHg (04/12 1730) Pulse Rate: 60 (04/12 1730)  Labs:  Recent Labs  05/16/13 1215  HGB 15.9  HCT 45.1  PLT 206  CREATININE 1.02    Estimated Creatinine Clearance: 86.6 ml/min (by C-G formula based on Cr of 1.02).   Medical History: Past Medical History  Diagnosis Date  . Blood transfusion   . GERD (gastroesophageal reflux disease)   . Hyperlipidemia   . Hypertension   . H/O hiatal hernia   . Arthritis     in his back, hips, knees, R shoulder      Assessment: 53 YOM with new splenic vein thrombus to start anticoagulation with Lovenox.  Baseline labs reviewed.   Goal of Therapy:  Anti-Xa level 0.6-1 units/ml 4hrs after LMWH dose given Monitor platelets by anticoagulation protocol: Yes    Plan:  - Lovenox 90mg  SQ Q12H - CBC Q72H while on Lovenox - F/U with oral AC when possible    Slater Mcmanaman D. Mina Marble, PharmD, BCPS Pager:  [phone] 05/16/2013, 6:47 PM

## 2013-05-16 NOTE — ED Provider Notes (Signed)
CSN: [id number]     Arrival date & time 05/16/13  1148 History   First MD Initiated Contact with Patient 05/16/13 1159     Chief Complaint  Patient presents with  . Abdominal Pain      Patient is a 58 y.o. male presenting with abdominal pain. The history is provided by the patient.  Abdominal Pain Pain location:  LUQ Pain quality: aching   Pain radiates to:  Does not radiate Pain severity:  Moderate Onset quality:  Gradual Duration: 1 month. Timing:  Constant Progression:  Unchanged Chronicity:  New Worsened by:  Eating Ineffective treatments:  None tried Associated symptoms: constipation   Associated symptoms: no chest pain, no fever, no shortness of breath and no vomiting     Past Medical History  Diagnosis Date  . Blood transfusion   . GERD (gastroesophageal reflux disease)   . Hyperlipidemia   . Hypertension   . H/O hiatal hernia   . Arthritis     in his back, hips, knees, R shoulder    Past Surgical History  Procedure Laterality Date  . Hip surgery      x2 right hip  . Shoulder surgery      right  . Hand surgery      left thumb tendon repair  . Femur hardware removal  2011    R femur- prothesis removed & replaced  . Posterior cervical fusion/foraminotomy N/A 04/20/2012    Procedure: POSTERIOR CERVICAL FUSION/FORAMINOTOMY LEVEL 1;  Surgeon: Ophelia Charter, MD;  Location: [facility];  Service: Neurosurgery;  Laterality: N/A;  C1/2 laminectomies with posterior screw fixation   Family History  Problem Relation Age of Onset  . Colon cancer Neg Hx   . Esophageal cancer Neg Hx   . Stomach cancer Neg Hx   . Rectal cancer Neg Hx    History  Substance Use Topics  . Smoking status: Current Every Day Smoker    Types: Cigars  . Smokeless tobacco: Never Used     Comment: one a day  . Alcohol Use: No    Review of Systems  Constitutional: Negative for fever.  Respiratory: Negative for shortness of breath.   Cardiovascular: Negative for chest pain.   Gastrointestinal: Positive for abdominal pain and constipation. Negative for vomiting and blood in stool.  Neurological: Negative for weakness.  All other systems reviewed and are negative.     Allergies  Review of patient's allergies indicates no known allergies.  Home Medications   Current Outpatient Rx  Name  Route  Sig  Dispense  Refill  . allopurinol (ZYLOPRIM) 300 MG tablet   Oral   Take 300 mg by mouth daily.         Marland Kitchen amLODipine (NORVASC) 10 MG tablet   Oral   Take 10 mg by mouth daily.         Marland Kitchen aspirin (ANACIN) 81 MG EC tablet   Oral   Take 81 mg by mouth daily.           . cyclobenzaprine (FLEXERIL) 10 MG tablet   Oral   Take 1 tablet (10 mg total) by mouth 3 (three) times daily as needed for muscle spasms.   30 tablet   0   . docusate sodium 100 MG CAPS   Oral   Take 100 mg by mouth 2 (two) times daily.   60 capsule   1   . HYDROcodone-acetaminophen (NORCO/VICODIN) 5-325 MG per tablet   Oral   Take 1  tablet by mouth 2 (two) times daily as needed for pain. Do not fill before 12/05/2012   60 tablet   0   . losartan (COZAAR) 100 MG tablet   Oral   Take 100 mg by mouth daily.         Marland Kitchen omeprazole (PRILOSEC) 40 MG capsule   Oral   Take 40 mg by mouth daily.         . sildenafil (VIAGRA) 50 MG tablet   Oral   Take 50 mg by mouth daily as needed for erectile dysfunction.         . traMADol (ULTRAM) 50 MG tablet   Oral   Take 50 mg by mouth every 6 (six) hours as needed for moderate pain.          BP 133/97  Pulse 81  Temp(Src) 98.5 F (36.9 C) (Oral)  Resp 16  Ht 6' (1.829 m)  Wt 202 lb (91.627 kg)  BMI 27.39 kg/m2  SpO2 100% Physical Exam CONSTITUTIONAL: Well developed/well nourished HEAD: Normocephalic/atraumatic EYES: EOMI/PERRL ENMT: Mucous membranes moist NECK: supple no meningeal signs SPINE:entire spine nontender CV: S1/S2 noted, no murmurs/rubs/gallops noted LUNGS: Lungs are clear to auscultation bilaterally, no  apparent distress ABDOMEN: soft, moderate LUQ tenderness, no rebound or guarding GU:no cva tenderness NEURO: Pt is awake/alert, moves all extremitiesx4 EXTREMITIES: pulses normal, full ROM SKIN: warm, color normal PSYCH: no abnormalities of mood noted  ED Course  Procedures  1:19 PM Pt with persistent LUQ pain for past month Given history/exam will proceed with CT imaging Pt agreeable with plan 3:18 PM Splenic vein thrombosis noted D/w family medicine dr Caryl Bis, will evaluate for admission Pt updated on plan  Labs Review Labs Reviewed  CBC WITH DIFFERENTIAL  COMPREHENSIVE METABOLIC PANEL  URINALYSIS, ROUTINE W REFLEX MICROSCOPIC  LIPASE, BLOOD   Imaging Review Ct Abdomen Pelvis W Contrast  05/16/2013   CLINICAL DATA:  Upper epigastric discomfort the preceding month progressively worse ; history of reflux and hiatal hernia.  EXAM: CT ABDOMEN AND PELVIS WITH CONTRAST  TECHNIQUE: Multidetector CT imaging of the abdomen and pelvis was performed using the standard protocol following bolus administration of intravenous contrast.  CONTRAST:  133mL OMNIPAQUE IOHEXOL 300 MG/ML SOLN intravenously ; the patient also received oral contrast material. Bone hemangioma may be in most likely was movement and did go through again when laying on delayed  COMPARISON:  None.  FINDINGS: There is near occlusive thrombus within the splenic vein with extension into the portal venous confluence. There are prominent splenic collaterals draining into the perigastric vessels which then drain into the superior mesenteric vein. The spleen demonstrates normal density. The pancreatic tail and distal body appear atrophic and there is prominence of the pancreatic duct in this portion of the pancreas. More proximally the pancreatic duct is not well demonstrated. A discrete pancreatic head or body mass is not demonstrated. No peripancreatic inflammatory changes demonstrated.  The liver exhibits an approximately 2 cm cm  diameter focus of enhancement on image 14 near the dome in the right lobe. There is subtle similar enhancement just inferior to this. There is no intrahepatic ductal dilation. There is likely a subcentimeter cyst in the right hepatic lobe as well as an approximately 1 cm diameter cyst in the left hepatic lobe. The gallbladder is mildly distended without evidence of stones or wall thickening.  The adrenal glands and kidneys are normal in appearance. The periaortic and pericaval regions also appears normal.  The stomach is partially  distended with the oral contrast. The duodenum is normal in appearance. Contrast has not yet reached the colon. There is a moderate stool burden within the ascending and transverse portions of the colon. The appendix is normal in appearance. The rectosigmoid colon exhibits no acute inflammation. The urinary bladder is normal in appearance. The prostate gland is mildly enlarged and produces an impression upon the urinary bladder base. There is small bilateral fat containing inguinal hernias. There is a tiny umbilical hernia containing fat.  The lumbar vertebral bodies are preserved in height. There is disc space narrowing at L4-5. There is a prominent inferior endplate osteophyte posteriorly at L5. Thebony pelvis exhibits no acute abnormality. The patient has undergone ORIF for previous femoral neck fracture. The lung bases are clear.  IMPRESSION: 1. 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 There are prominent splenic collaterals draining into the perigastric veins. 2. There is atrophy of the mid and distal body and tail of the pancreas without a discrete mass. There is also dilation of the pancreatic duct in this region without obvious etiology. 3. The liver demonstrates presence of tiny cysts and also areas of early enhancement which is a nonspecific finding and may reflect flash filling hemangiomas. Other pathology is  not excluded. 4. The gallbladder is mildly distended without evidence of stones or acute inflammation. 5. There is no acute bowel abnormality nor acute urinary tract abnormality. 6. A pancreatic protocol MRI is recommended to exclude an occult mass. This would also allow further evaluation of the liver. 7. These results were called by telephone at the time of interpretation on 05/16/2013 at 3:02 PM to Dr. Ripley Fraise , who verbally acknowledged these results.   Electronically Signed   By: David  Martinique   On: 05/16/2013 15:03     EKG Interpretation   Date/Time:  Sunday May 16 2013 12:05:37 EDT Ventricular Rate:  75 PR Interval:  175 QRS Duration: 82 QT Interval:  364 QTC Calculation: 406 R Axis:   -54 Text Interpretation:  Sinus rhythm Left axis deviation No significant  change since last tracing Confirmed by Christy Gentles  MD, Elenore Rota ([id number]) on  05/16/2013 12:10:09 PM      MDM   Final diagnoses:  Splenic vein thrombosis    Nursing notes including past medical history and social history reviewed and considered in documentation Labs/vital reviewed and considered     Sharyon Cable, MD 05/16/13 1519

## 2013-05-16 NOTE — ED Notes (Signed)
Pt has been working with PCP for past month for abd pain with no diagnosis. States the pain continues despite all the treatments they've tried and "now it feels like there is a knot in my stomach."

## 2013-05-16 NOTE — ED Notes (Signed)
Pt states he is unable to give urine at this time.

## 2013-05-16 NOTE — ED Notes (Signed)
Dr. Christy Gentles approved pt to eat.  Family at bedside.

## 2013-05-16 NOTE — ED Notes (Signed)
CT notified, pt has consumed one of two cups of the oral contrast solution.

## 2013-05-16 NOTE — ED Notes (Signed)
Dr. Wickline at bedside.  

## 2013-05-17 ENCOUNTER — Observation Stay (HOSPITAL_COMMUNITY)

## 2013-05-17 DIAGNOSIS — D7389 Other diseases of spleen: Secondary | ICD-10-CM

## 2013-05-17 LAB — CBC
HCT: 43.6 % (ref 39.0–52.0)
HEMOGLOBIN: 15.1 g/dL (ref 13.0–17.0)
MCH: 29 pg (ref 26.0–34.0)
MCHC: 34.6 g/dL (ref 30.0–36.0)
MCV: 83.7 fL (ref 78.0–100.0)
PLATELETS: 202 10*3/uL (ref 150–400)
RBC: 5.21 MIL/uL (ref 4.22–5.81)
RDW: 13.8 % (ref 11.5–15.5)
WBC: 8.4 10*3/uL (ref 4.0–10.5)

## 2013-05-17 LAB — COMPREHENSIVE METABOLIC PANEL
ALK PHOS: 94 U/L (ref 39–117)
ALT: 12 U/L (ref 0–53)
AST: 12 U/L (ref 0–37)
Albumin: 3.8 g/dL (ref 3.5–5.2)
BILIRUBIN TOTAL: 0.5 mg/dL (ref 0.3–1.2)
BUN: 10 mg/dL (ref 6–23)
CHLORIDE: 97 meq/L (ref 96–112)
CO2: 27 meq/L (ref 19–32)
Calcium: 9.3 mg/dL (ref 8.4–10.5)
Creatinine, Ser: 1.04 mg/dL (ref 0.50–1.35)
GFR calc Af Amer: 90 mL/min — ABNORMAL LOW (ref >90)
GFR, EST NON AFRICAN AMERICAN: 77 mL/min — AB (ref >90)
GLUCOSE: 105 mg/dL — AB (ref 70–99)
Potassium: 4.1 mEq/L (ref 3.7–5.3)
SODIUM: 137 meq/L (ref 137–147)
Total Protein: 7.3 g/dL (ref 6.0–8.3)

## 2013-05-17 MED ORDER — OXYCODONE HCL 5 MG PO TABS
5.0000 mg | ORAL_TABLET | ORAL | Status: DC | PRN
  Administered 2013-05-17 – 2013-05-18 (×2): 5 mg via ORAL
  Filled 2013-05-17 (×2): qty 1

## 2013-05-17 MED ORDER — RIVAROXABAN 15 MG PO TABS: 15.0000 mg | ORAL_TABLET | Freq: Two times a day (BID) | ORAL | Status: DC

## 2013-05-17 MED ORDER — MAGIC MOUTHWASH W/LIDOCAINE
15.0000 mL | Freq: Three times a day (TID) | ORAL | Status: DC | PRN
  Administered 2013-05-17: 15 mL via ORAL
  Filled 2013-05-17: qty 15

## 2013-05-17 MED ORDER — OXYCODONE-ACETAMINOPHEN 5-325 MG PO TABS
1.0000 | ORAL_TABLET | ORAL | Status: DC | PRN
  Administered 2013-05-17 – 2013-05-18 (×2): 1 via ORAL
  Filled 2013-05-17 (×2): qty 1

## 2013-05-17 MED ORDER — RIVAROXABAN 20 MG PO TABS: 20.0000 mg | ORAL_TABLET | Freq: Every day | ORAL | Status: DC

## 2013-05-17 MED ORDER — RIVAROXABAN 15 MG PO TABS
15.0000 mg | ORAL_TABLET | Freq: Two times a day (BID) | ORAL | Status: DC
  Administered 2013-05-17 – 2013-05-18 (×2): 15 mg via ORAL
  Filled 2013-05-17 (×4): qty 1

## 2013-05-17 MED ORDER — GADOBENATE DIMEGLUMINE 529 MG/ML IV SOLN
20.0000 mL | Freq: Once | INTRAVENOUS | Status: AC | PRN
  Administered 2013-05-17: 20 mL via INTRAVENOUS

## 2013-05-17 MED ORDER — RIVAROXABAN 15 MG PO TABS
15.0000 mg | ORAL_TABLET | Freq: Two times a day (BID) | ORAL | Status: DC
  Filled 2013-05-17 (×2): qty 1

## 2013-05-17 MED ORDER — LORAZEPAM 2 MG/ML IJ SOLN
1.0000 mg | Freq: Once | INTRAMUSCULAR | Status: AC
  Administered 2013-05-17: 1 mg via INTRAVENOUS

## 2013-05-17 MED ORDER — LORAZEPAM 2 MG/ML IJ SOLN
INTRAMUSCULAR | Status: AC
  Filled 2013-05-17: qty 1

## 2013-05-17 NOTE — Progress Notes (Signed)
Pt transported off unit to xray and [facility]. P. Angelica Pou RN

## 2013-05-17 NOTE — Progress Notes (Signed)
UR completed 

## 2013-05-17 NOTE — Care Management Note (Signed)
  Page 1 of 1   05/17/2013     2:50:12 PM   CARE MANAGEMENT NOTE 05/17/2013  Patient:  Ray [name], Ray [name]   Account Number:  [id number]  Date Initiated:  05/17/2013  Documentation initiated by:  Magdalen Spatz  Subjective/Objective Assessment:     Action/Plan:   Anticipated DC Date:     Anticipated DC Plan:  HOME/SELF CARE         Choice offered to / List presented to:             Status of service:   Medicare Important Message given?   (If response is "NO", the following Medicare IM given date fields will be blank) Date Medicare IM given:   Date Additional Medicare IM given:    Discharge Disposition:    Per UR Regulation:    If discussed at Long Length of Stay Meetings, dates discussed:    Comments: 05-17-13 $20 for 30 day supply/ no auth required/ can use any pharmacy except [facility]. Patient and wife aware. Magdalen Spatz RN BSN      05-17-13 Consult for Xarelto assistance . Patient eligible for 30 day free ( card given and explained to patient and his wife ) . Benefits check entered for co pay after 30 days , awaiting response , however, Baruch Merl with Xarelto estimated co pay will be between $14 and $24 dollars .   Patient will need prescription for 30 days and  separate prescription for after the first 30 days .  Magdalen Spatz RN BSN [phone]

## 2013-05-17 NOTE — H&P (Signed)
I have seen and examined this patient. I have discussed with Dr Caryl Bis.  I agree with their findings and plans as documented in their admit note.

## 2013-05-17 NOTE — Progress Notes (Signed)
Family Medicine Teaching Service Daily Progress Note Intern Pager: [phone]  Patient name: Ray [name] Medical record number: [id number] Date of birth: 1955-02-21 Age: 58 y.o. Gender: male  Primary Care Provider: Chrisandra Netters, MD Consultants: none Code Status: DNR  Pt Overview and Major Events to Date:  4/12: splenic vein thrombosis on CT, plan for MRCP  Assessment and Plan: Ray [name] is a 58 y.o. male presenting with LUQ abdominal pain not relieved by home medications. PMH is significant for HTN, reflux esophagitis, chronic back pain  # Abdominal pain: patient with continued abdominal pain despite recent increases in PPI therapy. 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 Note patient with atrophy in the pancreas and liver cysts seen on CT. 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 No known history of clots. Likely is a chronic issue given collaterals that have already formed. No evidence of splenic infarction on CT. Differential for causes include hypercoagulable state vs chronic pancreatitis (potential cause as the inflammation associated with this can contribute to the thrombosis) vs occult malignancy. Patient is currently hemodynamically stable.  -started lovenox for anticoagulation with consideration of transition to an oral agent   -will obtain MRCP abdomen to further characterize pancreatic atrophy and duct dilatation and liver lesions  -monitor vitals per unit protocol  -PPI  -FOBT to be collected -hypercoag workup to be completed in outpt setting?  # HTN: well controlled on admission  -will continue home amlodipine and losartan   # Gout: will continue home allopurinol   # Throat pain: likely early signs of viral infection -66mL magic mouth wash w/ lidocaine TID -monitor fever curve   FEN/GI: KVO, clear liquid diet, if unable to take improved PO may need to consider addition of IVF  Prophylaxis: full dose lovenox  Disposition: pending further w/up with MRCP; transition over to oral agent   Subjective: C/o sudden onset throat pain; has not been eating for the last several days; no recent sick contacts with similar symptoms  Objective: Temp:  [98 F (36.7 C)-99.1 F (37.3 C)] 98 F (36.7 C) (04/13 0551) Pulse Rate:  [55-83] 63 (04/13 0551) Resp:  [10-23] 18 (04/13 0551) BP: (113-158)/(63-101) 145/96 mmHg (04/13 0551) SpO2:  [95 %-100 %] 97 % (04/13 0551) Weight:  [202 lb (91.627 kg)-207 lb (93.895 kg)] 207 lb (93.895 kg) (04/12 1750) Physical Exam: General: NAD, resting comfortably in bed  HEENT: NCAT, PERRL, MMM; bilat shoddy lymphadenopathy  Cardiovascular: rrr, no murmurs appreciated  Respiratory: CTAB, no wheezes or crackles  Abdomen: s, tender to palpation in the LUQ, non-tender elsewhere, no guarding or rebound, no masses palpated  Extremities: no edema, WWP  Skin: no lesions  Neuro: alert, no focal deficits  Laboratory:  Recent Labs Lab 05/16/13 1215 05/17/13 0400  WBC 6.7 8.4  HGB 15.9 15.1  HCT 45.1 43.6  PLT 206 202    Recent Labs  Lab 05/16/13 1215 05/17/13 0400  NA 141 137  K 4.1 4.1  CL 100 97  CO2 23 27  BUN 11 10  CREATININE 1.02 1.04  CALCIUM 9.4 9.3  PROT 8.0 7.3  BILITOT 0.4 0.5  ALKPHOS 105 94  ALT 13 12  AST 13 12  GLUCOSE 112* 105*    Lipase  11  Imaging/Diagnostic Tests: Ct Abdomen Pelvis W Contrast  05/16/2013 IMPRESSION: 1. 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 There are prominent splenic collaterals draining into the perigastric veins. 2. There is atrophy of the mid and distal body and tail of the pancreas without a discrete mass. There is also dilation of the pancreatic duct in this region without obvious etiology. 3. The liver demonstrates presence of tiny cysts and also areas of early enhancement which is a nonspecific finding and may reflect flash filling hemangiomas. Other pathology is not excluded. 4. The gallbladder is mildly distended without evidence of stones or acute inflammation. 5. There is no acute bowel abnormality nor acute urinary tract abnormality. 6. A pancreatic protocol MRI is recommended to exclude an occult mass. This would also allow further evaluation of the liver.   Ray Masker, MD 05/17/2013, 7:16 AM PGY-1, [place] Intern pager: [phone], text pages welcome

## 2013-05-17 NOTE — Progress Notes (Signed)
FMTS Attending Daily Note:  [name] MD  [phone] pager  Family Practice pager:  [phone] I have discussed this patient with the resident Dr. Skeet Simmer and attending physician Dr. McDiarmid.  I agree with their findings, assessment, and care plan

## 2013-05-17 NOTE — Progress Notes (Signed)
ANTICOAGULATION CONSULT NOTE - Initial Consult  Pharmacy Consult for Lovenox ->Rivaroxaban Indication: splenic vein thrombus  No Known Allergies  Patient Measurements: Height: 6' (182.9 cm) Weight: 207 lb (93.895 kg) IBW/kg (Calculated) : 77.6  Vital Signs: Temp: 98.7 F (37.1 C) (04/13 1210) Temp src: Oral (04/13 1210) BP: 136/82 mmHg (04/13 1210) Pulse Rate: 67 (04/13 1210)  Labs:  Recent Labs  05/16/13 1215 05/17/13 0400  HGB 15.9 15.1  HCT 45.1 43.6  PLT 206 202  CREATININE 1.02 1.04    Estimated Creatinine Clearance: 92.1 ml/min (by C-G formula based on Cr of 1.04).   Medical History: Past Medical History  Diagnosis Date  . Blood transfusion   . GERD (gastroesophageal reflux disease)   . Hyperlipidemia   . Hypertension   . H/O hiatal hernia   . Arthritis     in his back, hips, knees, R shoulder     Medications:  Prescriptions prior to admission  Medication Sig Dispense Refill  . allopurinol (ZYLOPRIM) 300 MG tablet Take 300 mg by mouth daily.      Marland Kitchen amLODipine (NORVASC) 10 MG tablet Take 10 mg by mouth daily.      Marland Kitchen aspirin (ANACIN) 81 MG EC tablet Take 81 mg by mouth daily.        . cyclobenzaprine (FLEXERIL) 10 MG tablet Take 1 tablet (10 mg total) by mouth 3 (three) times daily as needed for muscle spasms.  30 tablet  0  . docusate sodium 100 MG CAPS Take 100 mg by mouth 2 (two) times daily.  60 capsule  1  . HYDROcodone-acetaminophen (NORCO/VICODIN) 5-325 MG per tablet Take 1 tablet by mouth 2 (two) times daily as needed for pain. Do not fill before 12/05/2012  60 tablet  0  . losartan (COZAAR) 100 MG tablet Take 100 mg by mouth daily.      Marland Kitchen omeprazole (PRILOSEC) 40 MG capsule Take 40 mg by mouth daily.      . sildenafil (VIAGRA) 50 MG tablet Take 50 mg by mouth daily as needed for erectile dysfunction.      . traMADol (ULTRAM) 50 MG tablet Take 50 mg by mouth every 6 (six) hours as needed for moderate pain.        Assessment: 58 y.o. male on  Lovenox for new splenic vein thrombus per CT. Now to transition to Xarelto. Last dose of Lovenox given at noon today.  Goal of Therapy:  Treatment of DVT Monitor platelets by anticoagulation protocol: Yes   Plan:  1. Xarelto 15mg  po BIDWC x 21 days starting tonight at 2000 then 20mg  daily with supper thereafter (this will start on 5/5) 2. Will f/u renal function and signs/symptoms of bleeding   Sherlon Handing, PharmD, BCPS Clinical pharmacist, pager [phone] 05/17/2013,12:51 PM

## 2013-05-18 DIAGNOSIS — K219 Gastro-esophageal reflux disease without esophagitis: Secondary | ICD-10-CM

## 2013-05-18 DIAGNOSIS — M109 Gout, unspecified: Secondary | ICD-10-CM

## 2013-05-18 LAB — PSA: PSA: 4.96 ng/mL — ABNORMAL HIGH (ref <=4.00)

## 2013-05-18 LAB — HIV ANTIBODY (ROUTINE TESTING W REFLEX): HIV 1&2 Ab, 4th Generation: NONREACTIVE

## 2013-05-18 MED ORDER — MAGIC MOUTHWASH W/LIDOCAINE: 15.0000 mL | Freq: Three times a day (TID) | ORAL | Status: DC | PRN

## 2013-05-18 MED ORDER — OXYCODONE-ACETAMINOPHEN 5-325 MG PO TABS: 1.0000 | ORAL_TABLET | ORAL | Status: DC | PRN

## 2013-05-18 MED ORDER — RIVAROXABAN 15 MG PO TABS: 15.0000 mg | ORAL_TABLET | Freq: Two times a day (BID) | ORAL | Status: DC

## 2013-05-18 MED ORDER — OXYCODONE HCL 5 MG PO TABS: 5.0000 mg | ORAL_TABLET | ORAL | Status: DC | PRN

## 2013-05-18 MED ORDER — RIVAROXABAN 20 MG PO TABS: 20.0000 mg | ORAL_TABLET | Freq: Every day | ORAL | Status: DC

## 2013-05-18 NOTE — Discharge Summary (Signed)
[facility] Discharge Summary  Patient name: Ray [name] Medical record number: [id number] Date of birth: 1955-11-18 Age: 58 y.o. Gender: male Date of Admission: 05/16/2013  Date of Discharge: 05/18/13 Admitting Physician: Blane Ohara McDiarmid, MD  Primary Care Provider: Chrisandra Netters, MD Consultants: none  Indication for Hospitalization: abdominal pain; thombosis  Discharge Diagnoses/Problem List:  Splenic vein thrombosis HTN Gout Throat pain  Disposition: home  Discharge Condition: stable  Discharge Exam:  BP 131/86  Pulse 73  Temp(Src) 98.5 F (36.9 C) (Oral)  Resp 16  Ht 6' (1.829 m)  Wt 207 lb (93.895 kg)  BMI 28.07 kg/m2  SpO2 95% General: NAD, resting comfortably in bed  HEENT: NCAT, PERRL, MMM; bilat shoddy lymphadenopathy  Cardiovascular: rrr, no murmurs appreciated  Respiratory: CTAB, no wheezes or crackles  Abdomen: s, tender to palpation in the LUQ, non-tender elsewhere, no guarding or rebound, no masses palpated  Extremities: no edema, WWP; gouty joints Skin: no lesions  Neuro: alert, no focal deficits   Brief Hospital Course:  Ray [name] is a 58 y.o. male presenting with LUQ abdominal pain not relieved by home medications. PMH is significant for HTN, reflux esophagitis, chronic back pain   # Abdominal pain: Patient noted abdominal painX1 month. States one month ago he woke up with dry heaves then got sore in his mid section (starting epigastrically then in the upper half of his abdomen). Seen in clinically multiple times started on miralax and increased dose of omeprazole. He followed up about a week later with continued pain and states they increased his omeprazole again. He continued to have pain, though was much improved at his 3rd office visit and was felt to have had a likely gastritis and advised to continue high dose omeprazole. Given persistence of pain presented to ED. In the ED he was given morphine 4 mg x2  doses without alleviation of his pain. He was given zofran for nausea. He had a CT abd/pelvis that revealed a splenic vein thrombosis as outline in the radiology section below. He was admitted for anticoag, pain control and further w/up. Nml lipase. Nml LFTs. No thickening of gall bladder wall. MRCP obtained as below. Initially given dilaudid for pain control, transitioned to PO oxy. On day of discharge was tolerating diet and with adequate control on percocet.   # Splenic vein thrombosis: Patient with thrombus evident on CT abd/pelvis. No known history of clots. Likely is a chronic issue given collaterals that have already formed. No evidence of splenic infarction on CT or MRCP. No signs of occult malignancy (did have recent 10lb weight loss likely related to poor PO intake). MRCP demonstrating signs of chronic pancreatitis assc with duct dilitation but no mass. Initially started on lovenox for anticoag transitioned to xarelto. Had recent colonoscopy negative. PSA obtained, mildly elevated 4.96 (recommend repeating as outpt). Hyper coag workup to be completed in outpt setting as indicated.   # HTN: Well controlled throughout admission on home medications amlodipine and losartan   # Gout: No flares. Continued home allopurinol   # Throat pain: Presenting night after admission, likely early signs of viral infection; started on 37mL magic mouth wash w/ lidocaine TID. Afebrile throughout admission  Issues for Follow Up:  1. Suggest repeat mildly elevated PSA (4.96); consider free/total PSA ratio 2. Toleration of anticoagulation; ability to afford medication 3. Return of normal bowel function 4. Consider hypercoagulable work up as outpatient 5. Regular cancer screenings as age, sex indicates  Significant Procedures:  MRCP  Significant Labs and Imaging:   Recent Labs Lab 05/16/13 1215 05/17/13 0400  WBC 6.7 8.4  HGB 15.9 15.1  HCT 45.1 43.6  PLT 206 202    Recent Labs Lab 05/16/13 1215  05/17/13 0400  NA 141 137  K 4.1 4.1  CL 100 97  CO2 23 27  GLUCOSE 112* 105*  BUN 11 10  CREATININE 1.02 1.04  CALCIUM 9.4 9.3  ALKPHOS 105 94  AST 13 12  ALT 13 12  ALBUMIN 4.3 3.8   PSA 4.96 HIV neg  MRCP 05/17/13 IMPRESSION:  Motion degraded images.  Splenic vein thrombosis with associated subocclusive thrombus  extending into portal vein.  Common duct is within normal limits. No choledocholithiasis is seen.  Irregular dilatation of the pancreatic duct in the body/tail,  possibly related to prior/chronic pancreatitis. No pancreatic mass  is seen.   Results/Tests Pending at Time of Discharge: none  Discharge Medications:    Medication List    STOP taking these medications       HYDROcodone-acetaminophen 5-325 MG per tablet  Commonly known as:  NORCO/VICODIN      TAKE these medications       allopurinol 300 MG tablet  Commonly known as:  ZYLOPRIM  Take 300 mg by mouth daily.     amLODipine 10 MG tablet  Commonly known as:  NORVASC  Take 10 mg by mouth daily.     ANACIN 81 MG EC tablet  Generic drug:  aspirin  Take 81 mg by mouth daily.     cyclobenzaprine 10 MG tablet  Commonly known as:  FLEXERIL  Take 1 tablet (10 mg total) by mouth 3 (three) times daily as needed for muscle spasms.     DSS 100 MG Caps  Take 100 mg by mouth 2 (two) times daily.     losartan 100 MG tablet  Commonly known as:  COZAAR  Take 100 mg by mouth daily.     magic mouthwash w/lidocaine Soln  Take 15 mLs by mouth 3 (three) times daily as needed for mouth pain.     omeprazole 40 MG capsule  Commonly known as:  PRILOSEC  Take 40 mg by mouth daily.     oxyCODONE-acetaminophen 5-325 MG per tablet  Commonly known as:  PERCOCET/ROXICET  Take 1-2 tablets by mouth every 4 (four) hours as needed for severe pain.     Rivaroxaban 15 MG Tabs tablet  Commonly known as:  XARELTO  Take 1 tablet (15 mg total) by mouth 2 (two) times daily with a meal. For the next 20 days ending  5/4     rivaroxaban 20 MG Tabs tablet  Commonly known as:  XARELTO  Take 1 tablet (20 mg total) by mouth daily with supper. Starting on 5/5  Start taking on:  06/08/2013     sildenafil 50 MG tablet  Commonly known as:  VIAGRA  Take 50 mg by mouth daily as needed for erectile dysfunction.     traMADol 50 MG tablet  Commonly known as:  ULTRAM  Take 50 mg by mouth every 6 (six) hours as needed for moderate pain.        Discharge Instructions: Please refer to Patient Instructions section of EMR for full details.  Patient was counseled important signs and symptoms that should prompt return to medical care, changes in medications, dietary instructions, activity restrictions, and follow up appointments.   Follow-Up Appointments: Follow-up Information   Follow up with Street, Harrell Gave, MD On  05/24/2013. (@9 :15)    Specialty:  Family Medicine   Contact information:   [place] [address] [phone]       Langston Masker, MD 05/18/2013, 1:32 PM PGY-1, [place]

## 2013-05-18 NOTE — Discharge Instructions (Signed)
Information on my medicine - XARELTO (rivaroxaban)  This medication education was reviewed with me or my healthcare representative as part of my discharge preparation.  The pharmacist that spoke with me during my hospital stay was:  Juanda Chance Rayla Pember, [place]? Xarelto was prescribed to treat blood clots that may have been found in the veins of your legs (deep vein thrombosis) or in your lungs (pulmonary embolism) and to reduce the risk of them occurring again.  What do you need to know about Xarelto? The starting dose is one 15 mg tablet taken TWICE daily with food for the FIRST 21 DAYS then on 5/5  the dose is changed to one 20 mg tablet taken ONCE A DAY with your evening meal.  DO NOT stop taking Xarelto without talking to the health care provider who prescribed the medication.  Refill your prescription for 20 mg tablets before you run out.  After discharge, you should have regular check-up appointments with your healthcare provider that is prescribing your Xarelto.  In the future your dose may need to be changed if your kidney function changes by a significant amount.  What do you do if you miss a dose? If you are taking Xarelto TWICE DAILY and you miss a dose, take it as soon as you remember. You may take two 15 mg tablets (total 30 mg) at the same time then resume your regularly scheduled 15 mg twice daily the next day.  If you are taking Xarelto ONCE DAILY and you miss a dose, take it as soon as you remember on the same day then continue your regularly scheduled once daily regimen the next day. Do not take two doses of Xarelto at the same time.   Important Safety Information Xarelto is a blood thinner medicine that can cause bleeding. You should call your healthcare provider right away if you experience any of the following:   Bleeding from an injury or your nose that does not stop.   Unusual colored urine (red or dark brown) or unusual colored  stools (red or black).   Unusual bruising for unknown reasons.   A serious fall or if you hit your head (even if there is no bleeding).  Some medicines may interact with Xarelto and might increase your risk of bleeding while on Xarelto. To help avoid this, consult your healthcare provider or pharmacist prior to using any new prescription or non-prescription medications, including herbals, vitamins, non-steroidal anti-inflammatory drugs (NSAIDs) and supplements.  This website has more information on Xarelto: [email or web address]-benson.[email or web address]

## 2013-05-18 NOTE — Progress Notes (Signed)
Discharge instructions, new medications and follow up appointments reviewed with the patient.  Patient voices understanding to teaching. Prescriptions and copies of his D/C instructions given to the patient.  Patient ambulatory to the door.  Home via [place] with his wife driving.

## 2013-05-19 NOTE — Discharge Summary (Signed)
Family Medicine Teaching Service  Discharge Note : Attending Jeff Geovanny Sartin MD Pager [phone] Inpatient Team Pager:  [phone]  I have reviewed this patient and the patient's chart and have discussed discharge planning with the resident at the time of discharge. I agree with the discharge plan as above.    

## 2013-05-24 ENCOUNTER — Ambulatory Visit (INDEPENDENT_AMBULATORY_CARE_PROVIDER_SITE_OTHER): Admitting: Family Medicine

## 2013-05-24 ENCOUNTER — Encounter: Payer: Self-pay | Admitting: Family Medicine

## 2013-05-24 VITALS — BP 136/94 | HR 100 | Temp 98.6°F | Wt 206.0 lb

## 2013-05-24 DIAGNOSIS — I82890 Acute embolism and thrombosis of other specified veins: Secondary | ICD-10-CM

## 2013-05-24 DIAGNOSIS — D7389 Other diseases of spleen: Secondary | ICD-10-CM

## 2013-05-24 DIAGNOSIS — K5900 Constipation, unspecified: Secondary | ICD-10-CM

## 2013-05-24 MED ORDER — OXYCODONE-ACETAMINOPHEN 5-325 MG PO TABS: 1.0000 | ORAL_TABLET | ORAL | Status: DC | PRN

## 2013-05-24 MED ORDER — SENNOSIDES-DOCUSATE SODIUM 8.6-50 MG PO TABS: 1.0000 | ORAL_TABLET | Freq: Every day | ORAL | Status: DC

## 2013-05-24 NOTE — Assessment & Plan Note (Signed)
Possibly secondary to narcotic use, though without much help with MiraLAX and Colace, in the past. Likely contributor to pain, though intrisic colon pathology is also a possibility, especially given recently diagnosed splenic vein thrombosis. Rx for Senna-docusate, today, with instructions for daily or BID use, as needed.

## 2013-05-24 NOTE — Progress Notes (Signed)
   Subjective:    Patient ID: Ray [name], male    DOB: April 11, 1955, 58 y.o.   MRN: [id number]  HPI: Pt presents to clinic for hospital follow-up of recently-diagnosed splenic vein thrombosis. - overall feels much better, though is still having some pain he thinks related to constipation - last had a BM three days ago, and had 3 in one day (dark from Pepto, but small amount) - finished Lovenox shots, now on Xarelto and doing okay with it (no new / unusual symptoms or side effects) - no leg swelling / redness / pain, vomiting; some mild nausea "now and then" - no blood in urine, stool; no coughing or vomiting blood - oxycodone not helping pain much at all; has helped in the past (has been on it for leg and back pain s/p injuries) - appetite is much better; feels like he lost about 8-10 lbs over a couple of weeks earlier this year, but is gaining some back - reports he has had a normal colonoscopy in the past couple of years (believes he saw [facility])  Review of Systems: As above.     Objective:   Physical Exam BP 136/94  Pulse 100  Temp(Src) 98.6 F (37 C) (Oral)  Wt 206 lb (93.441 kg) Gen: well-appearing adult male in NAD HEENT: [place]/AT, TM's clear bilaterally, PERRLA, EOMI, sclerae / conjunctivae clear  MMM, gingivae not pale Pulm: CTAB, no wheezes Cardio: RRR, no murmurs appreciated Abd: mild LUQ tenderness, but improved from hospitalization  Otherwise nontender, BS+ Ext: warm, well-perfused  No abd tenderness/pain with internal / external rotation or flexion / extension of hips bilaterally     Assessment & Plan:

## 2013-05-24 NOTE — Patient Instructions (Addendum)
Thank you for coming in, today!  I want you to start taking Senna-docusate (a stool softener and laxative).  I think your pain might be partially from constipation. If we get you having regular bowel movements, I think things will get better.  Try the Senna once a day for for few days. You can increase up to 2 per day (one in the morning and one at night) if you need.  Talk to your GI doctor to see if they need a new referral. They may want to repeat a colonoscopy or do other testing. Come back to see Dr. [name] in about 3 weeks. She may want to do some blood work. Please feel free to call with any questions or concerns at any time, at [phone]. --Dr. Venetia Maxon

## 2013-05-24 NOTE — Assessment & Plan Note (Addendum)
A: Doing well after hospitalization, though no specific provoking factor identified, to date; reported relatively recent colonoscopy (?[facility]) though did have an elevated PSA in the hospital. DDx also includes chronic pancreatitis. Stable on Xarelto without evidence of abnormal bleeding. Some abdominal pain perhaps related to constipation / hard stools.  P: Continue Xarelto. Refilled oxycodone. Advised to f/u with GI ([facility]) to consider repeat colonoscopy or other work-up, but may need a new referral. Otherwise, see also constipation problem list note and recent discharge summary. Need to consider repeat PSA and/or fuller outpt hypercoagulable work-up but will defer to PCP.

## 2013-05-25 ENCOUNTER — Encounter: Payer: Self-pay | Admitting: *Deleted

## 2013-05-25 ENCOUNTER — Telehealth: Payer: Self-pay | Admitting: Gastroenterology

## 2013-05-25 NOTE — Telephone Encounter (Signed)
Pt has had constipation for 1 month, was hospitalized for a spenlic clot and was put on xarelto.  Was told he needed to follow up with GI because of a change in bowels. Appt with Nevin Bloodgood for 05/26/13 830 am

## 2013-05-26 ENCOUNTER — Encounter: Payer: Self-pay | Admitting: Nurse Practitioner

## 2013-05-26 ENCOUNTER — Ambulatory Visit (INDEPENDENT_AMBULATORY_CARE_PROVIDER_SITE_OTHER): Admitting: Nurse Practitioner

## 2013-05-26 VITALS — BP 132/86 | HR 100 | Ht 70.0 in | Wt 209.4 lb

## 2013-05-26 DIAGNOSIS — D7389 Other diseases of spleen: Secondary | ICD-10-CM

## 2013-05-26 DIAGNOSIS — K5900 Constipation, unspecified: Secondary | ICD-10-CM

## 2013-05-26 DIAGNOSIS — I82890 Acute embolism and thrombosis of other specified veins: Secondary | ICD-10-CM

## 2013-05-26 MED ORDER — LACTULOSE 20 G PO PACK: 20.0000 g | PACK | Freq: Every day | ORAL | Status: DC
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
## 2013-05-26 NOTE — Patient Instructions (Addendum)
You have been given a separate informational sheet regarding your tobacco use, the importance of quitting and local resources to help you quit.  We have given you samples of the following medication to take: Kristalose 20 grams, please dissolve contents of packet in half glass of water If this works well please call back for a prescription  Please call back next week and speak with [place] to give an update on how you are feeling   You can use Dulcolax Suppository as needed    Please purchase Citrucel over the counter and take as directed once daily _____________________________________________________________________________________________________________________________________________________________  Constipation, Adult Constipation is when a person has fewer than 3 bowel movements a week; has difficulty having a bowel movement; or has stools that are dry, hard, or larger than normal. As people grow older, constipation is more common. If you try to fix constipation with medicines that make you have a bowel movement (laxatives), the problem may get worse. Long-term laxative use may cause the muscles of the colon to become weak. A low-fiber diet, not taking in enough fluids, and taking certain medicines may make constipation worse. CAUSES   Certain medicines, such as antidepressants, pain medicine, iron supplements, antacids, and water pills.   Certain diseases, such as diabetes, irritable bowel syndrome (IBS), thyroid disease, or depression.   Not drinking enough water.   Not eating enough fiber-rich foods.   Stress or travel.  Lack of physical activity or exercise.  Not going to the restroom when there is the urge to have a bowel movement.  Ignoring the urge to have a bowel movement.  Using laxatives too much. SYMPTOMS   Having fewer than 3 bowel movements a week.   Straining to have a bowel movement.   Having hard, dry, or larger than normal  stools.   Feeling full or bloated.   Pain in the lower abdomen.  Not feeling relief after having a bowel movement. DIAGNOSIS  Your caregiver will take a medical history and perform a physical exam. Further testing may be done for severe constipation. Some tests may include:   A barium enema X-ray to examine your rectum, colon, and sometimes, your small intestine.  A sigmoidoscopy to examine your lower colon.  A colonoscopy to examine your entire colon. TREATMENT  Treatment will depend on the severity of your constipation and what is causing it. Some dietary treatments include drinking more fluids and eating more fiber-rich foods. Lifestyle treatments may include regular exercise. If these diet and lifestyle recommendations do not help, your caregiver may recommend taking over-the-counter laxative medicines to help you have bowel movements. Prescription medicines may be prescribed if over-the-counter medicines do not work.  HOME CARE INSTRUCTIONS   Increase dietary fiber in your diet, such as fruits, vegetables, whole grains, and beans. Limit high-fat and processed sugars in your diet, such as [place] fries, hamburgers, cookies, candies, and soda.   A fiber supplement may be added to your diet if you cannot get enough fiber from foods.   Drink enough fluids to keep your urine clear or pale yellow.   Exercise regularly or as directed by your caregiver.   Go to the restroom when you have the urge to go. Do not hold it.  Only take medicines as directed by your caregiver. Do not take other medicines for constipation without talking to your caregiver first. [place] IF:   You have bright red blood in your stool.   Your constipation lasts for more  than 4 days or gets worse.   You have abdominal or rectal pain.   You have thin, pencil-like stools.  You have unexplained weight loss. MAKE SURE YOU:   Understand these instructions.  Will watch your  condition.  Will get help right away if you are not doing well or get worse. Document Released: 10/20/2003 Document Revised: 04/15/2011 Document Reviewed: 11/02/2012 [facility] Patient Information 2014 [place], [place].

## 2013-05-27 NOTE — Progress Notes (Signed)
i agree with the plan above  Ray [name], he needs upper EUS, radial +/- linear for splenic vein thrombosis, checking for chronic pancreatitis.  Also screening for gastric varcies.  +MAC, next available Thursday at [facility]

## 2013-05-28 ENCOUNTER — Telehealth: Payer: Self-pay

## 2013-05-28 DIAGNOSIS — K861 Other chronic pancreatitis: Secondary | ICD-10-CM

## 2013-05-28 NOTE — Telephone Encounter (Signed)
Message copied by Barron Alvine on Fri May 28, 2013  8:45 AM ------      Message from: Owens Loffler P      Created: Thu May 27, 2013  8:35 AM                   ----- Message -----         From: Willia Craze, NP         Sent: 05/26/2013   1:26 PM           To: Milus Banister, MD             ------

## 2013-05-28 NOTE — Telephone Encounter (Signed)
i agree with the plan above  Munira Polson, he needs upper EUS, radial +/- linear for splenic vein thrombosis, checking for chronic pancreatitis.  Also screening for gastric varcies.  +MAC, next available Thursday at [facility] 

## 2013-05-31 ENCOUNTER — Other Ambulatory Visit: Payer: Self-pay

## 2013-05-31 ENCOUNTER — Encounter (HOSPITAL_COMMUNITY): Payer: Self-pay | Admitting: *Deleted

## 2013-05-31 DIAGNOSIS — K861 Other chronic pancreatitis: Secondary | ICD-10-CM

## 2013-05-31 NOTE — Telephone Encounter (Signed)
Dr Ardis Hughs the pt is on xarelto, should he hold?

## 2013-05-31 NOTE — Telephone Encounter (Signed)
EUS scheduled, pt instructed and medications reviewed.  Patient instructions mailed to home.  Patient to call with any questions or concerns.  

## 2013-05-31 NOTE — Telephone Encounter (Signed)
Yes, his PCP will need to advise on whether he needs bridging with lovenox or OK to just hold coumadin five days.  thanks

## 2013-06-01 ENCOUNTER — Ambulatory Visit (INDEPENDENT_AMBULATORY_CARE_PROVIDER_SITE_OTHER): Admitting: Family Medicine

## 2013-06-01 ENCOUNTER — Telehealth: Payer: Self-pay | Admitting: Family Medicine

## 2013-06-01 ENCOUNTER — Encounter: Payer: Self-pay | Admitting: Family Medicine

## 2013-06-01 ENCOUNTER — Encounter (HOSPITAL_COMMUNITY): Payer: Self-pay | Admitting: Pharmacy Technician

## 2013-06-01 VITALS — BP 135/90 | HR 86 | Temp 98.7°F | Ht 70.0 in | Wt 202.0 lb

## 2013-06-01 DIAGNOSIS — R319 Hematuria, unspecified: Secondary | ICD-10-CM | POA: Insufficient documentation

## 2013-06-01 DIAGNOSIS — Z7901 Long term (current) use of anticoagulants: Secondary | ICD-10-CM

## 2013-06-01 LAB — POCT URINALYSIS DIPSTICK
GLUCOSE UA: NEGATIVE
Leukocytes, UA: NEGATIVE
Nitrite, UA: POSITIVE
Protein, UA: 100
SPEC GRAV UA: 1.025
UROBILINOGEN UA: 1
pH, UA: 6

## 2013-06-01 LAB — POCT UA - MICROSCOPIC ONLY

## 2013-06-01 LAB — POCT HEMOGLOBIN: HEMOGLOBIN: 15.3 g/dL (ref 14.1–18.1)

## 2013-06-01 NOTE — Progress Notes (Signed)
Patient ID: Ray [name], male   DOB: 1955/11/08, 58 y.o.   MRN: [id number] Subjective:      Ray [name] is a 58 y.o. male here for evaluation of gross hematuria. Patient has had 2 episodes of hematuria. Onset of hematuria was 1 day ago.There is not a history of nephrolithiasis. Associated symptoms include: none. Patient admits to history of taking xarelto 15 mg BID x [redacted] weeks along with ASA 81 mg daily, smoking one cigar daily, ex-cigarette smoker quit in 1995. Patient denies history of sexually transmitted diseases and trauma. He denies melena or hematochezia. Also denies epistaxis.   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   Review of Systems Pertinent items are noted in HPI.    Objective:    BP 135/90  Pulse 86  Temp(Src) 98.7 F (37.1 C) (Oral)  Ht 5\' 10"  (1.778 m)  Wt 202 lb (91.627 kg)  BMI 28.98 kg/m2 General appearance: alert, cooperative and no distress Back: symmetric, no curvature. ROM normal. No CVA tenderness. Lungs: clear to auscultation bilaterally Heart: regular rate and rhythm, S1, S2 normal, no murmur, click, rub or gallop Abdomen: mild TTP LUQ, no masses, no rebound, no guarding. no suprapubic tenderness Male genitalia: penis: no lesions or discharge. testes: no masses or tenderness. no hernias Rectal: normal tone, prostate symmetrically enlarge, nontender, FOBT negative.   Lab Review UA has been reviewed. UA results: hematuria to a large degree and nitrite positive, protein

## 2013-06-01 NOTE — Patient Instructions (Addendum)
Mr. Teuscher  Thank you for coming in today. You have confirmed hematuria (blood in urine). There is no blood in stool or evidence of GI bleed.  Plan: STOP aspirin START xarelto 20 mg once daily today. Keep appt for endoscopy.   Hematuria may persist but as long as it does not result in anemia it is best to continue xarelto to prevent progression of splenic vein thrombus.    F/u in one week with Dr. [name] for recheck of Hgb.  F/u sooner if bleeding increases.   Dr. Adrian Blackwater

## 2013-06-01 NOTE — Telephone Encounter (Signed)
Emergency Line Phone Call  Patient states he woke like normal this morning. Went to the bathroom and noticed blood in his urine. States "it was enough that I noticed it right away." He denies any dysuria, light headedness, chest pain, trouble breathing, blood in his stool, bleeding from other sites, or prior hematuria. Notes that the patient is on xarelto twice a day at this time. I advised that he should hold his morning xarelto dose and call our office for an appointment to be evaluated this morning. I discussed reasons for him to present to the ED including worsening hematuria, development of trouble breathing or chest pain, or significant pain associated with the hematuria. He voiced understanding of this and stated he would call for an appointment later this morning.  Tommi Rumps, MD

## 2013-06-01 NOTE — Assessment & Plan Note (Signed)
A: Gross hemturia on xarelto and ASA. Hemodynamically stable. Stable Hgb. P: Stop ASA Change xarelto to 20 mg once daily (instead of 15 mg BID) starting today.  Culture urine. Close f/u in one week for repeat Hgb (CBC to check platelets) and hematuria f/u.  Counseled patient to s/s to prompt f/u sooner including worsening bleeding, fatigue, SOB, dizziness, lightheadedness.

## 2013-06-02 LAB — URINE CULTURE
Colony Count: NO GROWTH
ORGANISM ID, BACTERIA: NO GROWTH

## 2013-06-04 ENCOUNTER — Telehealth: Payer: Self-pay

## 2013-06-04 NOTE — Telephone Encounter (Signed)
Message copied by Barron Alvine on Fri Jun 04, 2013  8:34 AM ------      Message from: Barron Alvine      Created: Mon May 31, 2013 11:23 AM       Waiting on anti coag  ------

## 2013-06-04 NOTE — Telephone Encounter (Signed)
He should not take his xarelto the day before the procedure, and restart it that night after the procedure (assuming no biopsies, etc are taken which would make him higher risk for bleeds) Am I correct in assuming you will communicate this information to him? Thanks, Chrisandra Netters, MD   Pt has been notified and will hold xarelto the day prior

## 2013-06-04 NOTE — Telephone Encounter (Signed)
Response received pat aware see alternate note

## 2013-06-08 ENCOUNTER — Ambulatory Visit (INDEPENDENT_AMBULATORY_CARE_PROVIDER_SITE_OTHER): Admitting: Family Medicine

## 2013-06-08 ENCOUNTER — Encounter: Payer: Self-pay | Admitting: Family Medicine

## 2013-06-08 VITALS — BP 126/79 | HR 87 | Temp 98.1°F | Ht 70.0 in | Wt 204.2 lb

## 2013-06-08 DIAGNOSIS — D7389 Other diseases of spleen: Secondary | ICD-10-CM

## 2013-06-08 DIAGNOSIS — Z7901 Long term (current) use of anticoagulants: Secondary | ICD-10-CM

## 2013-06-08 DIAGNOSIS — I82890 Acute embolism and thrombosis of other specified veins: Secondary | ICD-10-CM

## 2013-06-08 DIAGNOSIS — R319 Hematuria, unspecified: Secondary | ICD-10-CM

## 2013-06-08 LAB — CBC
HCT: 40.6 % (ref 39.0–52.0)
Hemoglobin: 13.9 g/dL (ref 13.0–17.0)
MCH: 27.7 pg (ref 26.0–34.0)
MCHC: 34.2 g/dL (ref 30.0–36.0)
MCV: 80.9 fL (ref 78.0–100.0)
PLATELETS: 191 10*3/uL (ref 150–400)
RBC: 5.02 MIL/uL (ref 4.22–5.81)
RDW: 15.3 % (ref 11.5–15.5)
WBC: 7.1 10*3/uL (ref 4.0–10.5)

## 2013-06-08 LAB — POCT HEMOGLOBIN: Hemoglobin: 13.5 g/dL — AB (ref 14.1–18.1)

## 2013-06-08 MED ORDER — RIVAROXABAN 20 MG PO TABS: 20.0000 mg | ORAL_TABLET | Freq: Every day | ORAL | Status: DC

## 2013-06-08 MED ORDER — HYDROCODONE-ACETAMINOPHEN 10-325 MG PO TABS: 0.5000 | ORAL_TABLET | Freq: Four times a day (QID) | ORAL | Status: DC | PRN

## 2013-06-08 NOTE — Anesthesia Preprocedure Evaluation (Addendum)
Anesthesia Evaluation  Patient identified by MRN, date of birth, ID band Patient awake    Reviewed: Allergy & Precautions, H&P , NPO status , Patient's Chart, lab work & pertinent test results, reviewed documented beta blocker date and time   Airway Mallampati: II TM Distance: >3 FB Neck ROM: Full    Dental no notable dental hx. (+) Teeth Intact, Dental Advisory Given   Pulmonary neg pulmonary ROS, Current Smoker,  breath sounds clear to auscultation  Pulmonary exam normal       Cardiovascular hypertension, Pt. on medications Rhythm:Regular Rate:Normal     Neuro/Psych Raynaud's. Cervical fusion. negative psych ROS   GI/Hepatic Neg liver ROS, hiatal hernia, GERD-  Medicated and Poorly Controlled,pancreatitis   Endo/Other  negative endocrine ROS  Renal/GU negative Renal ROS  negative genitourinary   Musculoskeletal negative musculoskeletal ROS (+)   Abdominal   Peds negative pediatric ROS (+)  Hematology negative hematology ROS (+)   Anesthesia Other Findings   Reproductive/Obstetrics negative OB ROS                         Anesthesia Physical Anesthesia Plan  ASA: III  Anesthesia Plan: MAC   Post-op Pain Management:    Induction:   Airway Management Planned:   Additional Equipment:   Intra-op Plan:   Post-operative Plan:   Informed Consent:   Plan Discussed with: Surgeon  Anesthesia Plan Comments:         Anesthesia Quick Evaluation

## 2013-06-08 NOTE — Assessment & Plan Note (Signed)
A: persistent. On xarelto. Some hematuria. Hgb normal has dropped from baseline of 15 to 13. Still having pain. P: Vicodin prn pain Off xarelto pending EUS with biopsies anticipate patient advised to wait to restart xarelto until 06/12/13 (2 days post procedure)  F/u CBC If Hgb has truly dropped repeat CBC needed post EUS

## 2013-06-08 NOTE — Assessment & Plan Note (Signed)
A: improved from report.  P: monitor symptoms

## 2013-06-08 NOTE — Progress Notes (Signed)
   Subjective:    Patient ID: ETHANIEL GARFIELD, male    DOB: January 17, 1956, 58 y.o.   MRN: [id number] CC: hematuria follow up HPI 58 yr male with splenic infarct presents for hematuria followup. He was seen last week for gross hematuria. He reports symptoms resolved in 2 days. He had normal urine for 3 days. He hematuria again for 2 days. Now has normal urine. No dysuria. Has persistent left upper quadrants as per medical pain. Patient scheduled for upper endoscopic ultrasound on 06/11/2011. Today will be his last day of xarelto in anticipation of the procedure. Patient reports to me that biopsies are planned.  Soc Hx: non smoker  Fam hx: negative for VTE, bleeding disorder, recurrent miscarriages  Review of Systems As per history of present illness    Objective:   Physical Exam BP 126/79  Pulse 87  Temp(Src) 98.1 F (36.7 C) (Oral)  Ht 5\' 10"  (1.778 m)  Wt 204 lb 3.2 oz (92.625 kg)  BMI 29.30 kg/m2 General appearance: alert, cooperative and no distress Back: no CVA tenderness  Abdomen: LUQ and periumbilical tenderness, no rebound   Lab Results  Component Value Date   HGB 13.5* 06/08/2013       Assessment & Plan:

## 2013-06-08 NOTE — Patient Instructions (Signed)
Ray [name],  Thank you for coming in today. Your hemoglobin has dropped a bit.  I will confirm this with a CBC.  Do take your last xarelto dose today. With the plan to take biopsies I do recommend restarting xarelto 2 days post procedure, which will be 06/12/13.   If your hemoglobin has truly dropped. You will need to come into the office next week for a repeat check to help determine if that is a continual loss or 13 is your new baseline.  You will be called with the results and follow up plan.   Refilled Vicodin to help with abdominal pain. Let Dr. [name] know if that works well for you.  Do plan to see her for follow up in early June. She has some appt open on June 8th.   Dr. Adrian Blackwater

## 2013-06-09 NOTE — Addendum Note (Signed)
Addended by: Boykin Nearing on: 06/09/2013 02:01 PM   Modules accepted: Orders

## 2013-06-10 ENCOUNTER — Encounter (HOSPITAL_COMMUNITY): Admitting: Anesthesiology

## 2013-06-10 ENCOUNTER — Encounter (HOSPITAL_COMMUNITY)
Admission: RE | Disposition: A | Discharge status: Home / Self Care | Payer: Self-pay | Source: Ambulatory Visit | Attending: Gastroenterology

## 2013-06-10 ENCOUNTER — Ambulatory Visit (HOSPITAL_COMMUNITY)
Admission: RE | Admit: 2013-06-10 | Discharge: 2013-06-10 | Disposition: A | Discharge status: Home / Self Care | Source: Ambulatory Visit | Attending: Gastroenterology | Admitting: Gastroenterology

## 2013-06-10 ENCOUNTER — Encounter (HOSPITAL_COMMUNITY): Payer: Self-pay

## 2013-06-10 ENCOUNTER — Ambulatory Visit (HOSPITAL_COMMUNITY): Admitting: Anesthesiology

## 2013-06-10 DIAGNOSIS — K861 Other chronic pancreatitis: Secondary | ICD-10-CM | POA: Insufficient documentation

## 2013-06-10 DIAGNOSIS — Z7901 Long term (current) use of anticoagulants: Secondary | ICD-10-CM | POA: Insufficient documentation

## 2013-06-10 DIAGNOSIS — F1010 Alcohol abuse, uncomplicated: Secondary | ICD-10-CM | POA: Insufficient documentation

## 2013-06-10 DIAGNOSIS — Z981 Arthrodesis status: Secondary | ICD-10-CM | POA: Insufficient documentation

## 2013-06-10 DIAGNOSIS — K648 Other hemorrhoids: Secondary | ICD-10-CM | POA: Insufficient documentation

## 2013-06-10 DIAGNOSIS — I7300 Raynaud's syndrome without gangrene: Secondary | ICD-10-CM | POA: Insufficient documentation

## 2013-06-10 DIAGNOSIS — I82890 Acute embolism and thrombosis of other specified veins: Secondary | ICD-10-CM

## 2013-06-10 DIAGNOSIS — K859 Acute pancreatitis without necrosis or infection, unspecified: Secondary | ICD-10-CM | POA: Insufficient documentation

## 2013-06-10 DIAGNOSIS — K219 Gastro-esophageal reflux disease without esophagitis: Secondary | ICD-10-CM | POA: Insufficient documentation

## 2013-06-10 DIAGNOSIS — D7389 Other diseases of spleen: Secondary | ICD-10-CM | POA: Insufficient documentation

## 2013-06-10 DIAGNOSIS — K449 Diaphragmatic hernia without obstruction or gangrene: Secondary | ICD-10-CM | POA: Insufficient documentation

## 2013-06-10 DIAGNOSIS — I10 Essential (primary) hypertension: Secondary | ICD-10-CM | POA: Insufficient documentation

## 2013-06-10 DIAGNOSIS — Z79899 Other long term (current) drug therapy: Secondary | ICD-10-CM | POA: Insufficient documentation

## 2013-06-10 DIAGNOSIS — F17200 Nicotine dependence, unspecified, uncomplicated: Secondary | ICD-10-CM | POA: Insufficient documentation

## 2013-06-10 DIAGNOSIS — K5900 Constipation, unspecified: Secondary | ICD-10-CM | POA: Insufficient documentation

## 2013-06-10 DIAGNOSIS — K5730 Diverticulosis of large intestine without perforation or abscess without bleeding: Secondary | ICD-10-CM | POA: Insufficient documentation

## 2013-06-10 HISTORY — DX: Gout, unspecified: M10.9

## 2013-06-10 HISTORY — PX: EUS: SHX5427

## 2013-06-10 SURGERY — UPPER ENDOSCOPIC ULTRASOUND (EUS) LINEAR
Anesthesia: Monitor Anesthesia Care

## 2013-06-10 MED ORDER — BUTAMBEN-TETRACAINE-BENZOCAINE 2-2-14 % EX AERO
INHALATION_SPRAY | CUTANEOUS | Status: DC | PRN
  Administered 2013-06-10: 2 via TOPICAL

## 2013-06-10 MED ORDER — PROPOFOL 10 MG/ML IV BOLUS
INTRAVENOUS | Status: AC
  Filled 2013-06-10: qty 20

## 2013-06-10 MED ORDER — FENTANYL CITRATE 0.05 MG/ML IJ SOLN
INTRAMUSCULAR | Status: AC
  Filled 2013-06-10: qty 2

## 2013-06-10 MED ORDER — SODIUM CHLORIDE 0.9 % IV SOLN: INTRAVENOUS | Status: DC

## 2013-06-10 MED ORDER — PROPOFOL INFUSION 10 MG/ML OPTIME
INTRAVENOUS | Status: DC | PRN
  Administered 2013-06-10: 300 ug/kg/min via INTRAVENOUS

## 2013-06-10 MED ORDER — FENTANYL CITRATE 0.05 MG/ML IJ SOLN
25.0000 ug | INTRAMUSCULAR | Status: DC | PRN
  Administered 2013-06-10 (×2): 25 ug via INTRAVENOUS

## 2013-06-10 MED ORDER — LACTATED RINGERS IV SOLN
INTRAVENOUS | Status: DC
  Administered 2013-06-10: 1000 mL via INTRAVENOUS

## 2013-06-10 NOTE — Interval H&P Note (Signed)
History and Physical Interval Note:  06/10/2013 7:17 AM  Ray [name]  has presented today for surgery, with the diagnosis of Chronic pancreatitis 577.1  The various methods of treatment have been discussed with the patient and family. After consideration of risks, benefits and other options for treatment, the patient has consented to  Procedure(s): UPPER ENDOSCOPIC ULTRASOUND (EUS) LINEAR (N/A) as a surgical intervention .  The patient's history has been reviewed, patient examined, no change in status, stable for surgery.  I have reviewed the patient's chart and labs.  Questions were answered to the patient's satisfaction.     Milus Banister

## 2013-06-10 NOTE — Anesthesia Postprocedure Evaluation (Signed)
  Anesthesia Post-op Note  Patient: Ray [name]  Procedure(s) Performed: Procedure(s) (LRB): UPPER ENDOSCOPIC ULTRASOUND (EUS) LINEAR (N/A)  Patient Location: PACU  Anesthesia Type: MAC  Level of Consciousness: awake and alert   Airway and Oxygen Therapy: Patient Spontanous Breathing  Post-op Pain: mild  Post-op Assessment: Post-op Vital signs reviewed, Patient's Cardiovascular Status Stable, Respiratory Function Stable, Patent Airway and No signs of Nausea or vomiting  Last Vitals:  Filed Vitals:   06/10/13 0809  BP: 117/84  Pulse: 82  Temp:   Resp: 19    Post-op Vital Signs: stable   Complications: No apparent anesthesia complications

## 2013-06-10 NOTE — Transfer of Care (Signed)
Immediate Anesthesia Transfer of Care Note  Patient: Ray [name]  Procedure(s) Performed: Procedure(s): UPPER ENDOSCOPIC ULTRASOUND (EUS) LINEAR (N/A)  Patient Location: PACU and Endoscopy Unit  Anesthesia Type:MAC  Level of Consciousness: awake and patient cooperative  Airway & Oxygen Therapy: Patient Spontanous Breathing and Patient connected to nasal cannula oxygen  Post-op Assessment: Report given to PACU RN and Post -op Vital signs reviewed and stable  Post vital signs: Reviewed and stable  Complications: No apparent anesthesia complications

## 2013-06-10 NOTE — Op Note (Signed)
[facility] [place], [address]   ENDOSCOPIC ULTRASOUND PROCEDURE REPORT  PATIENT: Ray [name], Ray [name]  MR#: [id number] BIRTHDATE: 09/04/1955  GENDER: Male ENDOSCOPIST: Milus Banister, MD PROCEDURE DATE:  06/10/2013 PROCEDURE:   Upper EUS ASA CLASS:      Class III INDICATIONS:   recently diagnosed splenic vein thrombosis, unclear etiology.  Pancreatic duct abnormal on CT, MRI without discrete mass lesions; previous alcoholc (quit 20 years ago). MEDICATIONS: MAC sedation, administered by CRNA  DESCRIPTION OF PROCEDURE:   After the risks benefits and alternatives of the procedure were  explained, informed consent was obtained. The patient was then placed in the left, lateral, decubitus postion and IV sedation was administered. Throughout the procedure, the patients blood pressure, pulse and oxygen saturations were monitored continuously.  Under direct visualization, the Pentax EUS Radial [id number]  endoscope was introduced through the mouth  and advanced to the second portion of the duodenum .  Water was used as necessary to provide an acoustic interface.  Upon completion of the imaging, water was removed and the patient was sent to the recovery room in satisfactory condition.   Endoscopic findings (with radial and linear echoendoscopes): 1. There was solid food in stomach without anatomic outlet obstruction. 2. No esophageal or gastric varices  EUS findings: 1. The pancreatic parenchyma was abnormal throughout the gland. This was lobular, somewhat honecombed. 2. The main pancreatic duct was abnormal in body, tail of gland. It had hyperechoic walls, was dilated and ectated with dilated side branches in body and tail. 3. No discrete solid mass lesion in pancreas. 4. No peripancreatic adenopathy. 5. CBD was normal, non-dilated. 6. Gallbladder was normal. 7. Limited views of liver, spleen were normal.  Impression: Pancreatic parenchymal and  pancreatic duct findings consistent with chronic pancreatitis. This is likely due to his previous alcohol overuse, abuse.  These findings decrease the sensitivity of EUS to find pancreatic neoplasm, but no discrete masses were noted on this exam or on CT or MRI last month.  No gastric varcies.  My office will get in touch to schedule follow up appointment in 6-7 weeks. He can resume anticoagulation for his splenic vein thrombosis today.  Solid food remaining in stomach this AM, suggests poor gastric function (could be from chronic narcotic pain medicines). He should eat smaller, more frequent meals (4-5 small meals per day)   _______________________________ eSigned:  Milus Banister, MD 06/10/2013 8:13 AM

## 2013-06-10 NOTE — Discharge Instructions (Signed)
YOU HAD AN ENDOSCOPIC PROCEDURE TODAY: Refer to the procedure report that was given to you for any specific questions about what was found during the examination.  If the procedure report does not answer your questions, please call your gastroenterologist to clarify. ° °YOU SHOULD EXPECT: Some feelings of bloating in the abdomen. Passage of more gas than usual.  Walking can help get rid of the air that was put into your GI tract during the procedure and reduce the bloating. If you had a lower endoscopy (such as a colonoscopy or flexible sigmoidoscopy) you may notice spotting of blood in your stool or on the toilet paper.  ° °DIET: Your first meal following the procedure should be a light meal and then it is ok to progress to your normal diet.  A half-sandwich or bowl of soup is an example of a good first meal.  Heavy or fried foods are harder to digest and may make you feel nasueas or bloated.  Drink plenty of fluids but you should avoid alcoholic beverages for 24 hours. ° °ACTIVITY: Your care partner should take you home directly after the procedure.  You should plan to take it easy, moving slowly for the rest of the day.  You can resume normal activity the day after the procedure however you should NOT DRIVE or use heavy machinery for 24 hours (because of the sedation medicines used during the test).   ° °SYMPTOMS TO REPORT IMMEDIATELY  °A gastroenterologist can be reached at any hour.  Please call your doctor's office for any of the following symptoms: ° °· Following lower endoscopy (colonoscopy, flexible sigmoidoscopy) ° Excessive amounts of blood in the stool ° Significant tenderness, worsening of abdominal pains ° Swelling of the abdomen that is new, acute ° Fever of 100° or higher °· Following upper endoscopy (EGD, EUS, ERCP) ° Vomiting of blood or coffee ground material ° New, significant abdominal pain ° New, significant chest pain or pain under the shoulder blades ° Painful or persistently difficult  swallowing ° New shortness of breath ° Black, tarry-looking stools ° °FOLLOW UP: °If any biopsies were taken you will be contacted by phone or by letter within the next 1-3 weeks.  Call your gastroenterologist if you have not heard about the biopsies in 3 weeks.  °Please also call your gastroenterologist's office with any specific questions about appointments or follow up tests. ° °Monitored Anesthesia Care  °Monitored anesthesia care is an anesthesia service for a medical procedure. Anesthesia is the loss of the ability to feel pain. It is produced by medications called anesthetics. It may affect a small area of your body (local anesthesia), a large area of your body (regional anesthesia), or your entire body (general anesthesia). The need for monitored anesthesia care depends your procedure, your condition, and the potential need for regional or general anesthesia. It is often provided during procedures where:  °· General anesthesia may be needed if there are complications. This is because you need special care when you are under general anesthesia.   °· You will be under local or regional anesthesia. This is so that you are able to have higher levels of anesthesia if needed.   °· You will receive calming medications (sedatives). This is especially the case if sedatives are given to put you in a semi-conscious state of relaxation (deep sedation). This is because the amount of sedative needed to produce this state can be hard to predict. Too much of a sedative can produce general anesthesia. °Monitored anesthesia care   is performed by one or more caregivers who have special training in all types of anesthesia. You will need to meet with these caregivers before your procedure. During this meeting, they will ask you about your medical history. They will also give you instructions to follow. (For example, you will need to stop eating and drinking before your procedure. You may also need to stop or change medications  you are taking.) During your procedure, your caregivers will stay with you. They will:  °· Watch your condition. This includes watching you blood pressure, breathing, and level of pain.   °· Diagnose and treat problems that occur.   °· Give medications if they are needed. These may include calming medications (sedatives) and anesthetics.   °· Make sure you are comfortable.   °Having monitored anesthesia care does not necessarily mean that you will be under anesthesia. It does mean that your caregivers will be able to manage anesthesia if you need it or if it occurs. It also means that you will be able to have a different type of anesthesia than you are having if you need it. When your procedure is complete, your caregivers will continue to watch your condition. They will make sure any medications wear off before you are allowed to go home.  °Document Released: 10/17/2004 Document Revised: 05/18/2012 Document Reviewed: 03/04/2012 °Exit[employer]® Patient Information ©2014 [employer]. ° °
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
## 2013-06-11 ENCOUNTER — Encounter (HOSPITAL_COMMUNITY): Payer: Self-pay | Admitting: Gastroenterology

## 2013-06-14 ENCOUNTER — Other Ambulatory Visit

## 2013-06-23 ENCOUNTER — Ambulatory Visit (INDEPENDENT_AMBULATORY_CARE_PROVIDER_SITE_OTHER): Admitting: Family Medicine

## 2013-06-23 ENCOUNTER — Encounter: Payer: Self-pay | Admitting: Family Medicine

## 2013-06-23 VITALS — BP 133/80 | HR 91 | Temp 99.1°F | Ht 70.0 in | Wt 194.0 lb

## 2013-06-23 DIAGNOSIS — I82890 Acute embolism and thrombosis of other specified veins: Secondary | ICD-10-CM

## 2013-06-23 DIAGNOSIS — I10 Essential (primary) hypertension: Secondary | ICD-10-CM

## 2013-06-23 DIAGNOSIS — M109 Gout, unspecified: Secondary | ICD-10-CM

## 2013-06-23 DIAGNOSIS — D7389 Other diseases of spleen: Secondary | ICD-10-CM

## 2013-06-23 LAB — CBC
HCT: 44.3 % (ref 39.0–52.0)
Hemoglobin: 15 g/dL (ref 13.0–17.0)
MCH: 28.1 pg (ref 26.0–34.0)
MCHC: 33.9 g/dL (ref 30.0–36.0)
MCV: 83.1 fL (ref 78.0–100.0)
Platelets: 222 10*3/uL (ref 150–400)
RBC: 5.33 MIL/uL (ref 4.22–5.81)
RDW: 15 % (ref 11.5–15.5)
WBC: 7.2 10*3/uL (ref 4.0–10.5)

## 2013-06-23 LAB — URIC ACID: Uric Acid, Serum: 5 mg/dL (ref 4.0–7.8)

## 2013-06-23 MED ORDER — OXYCODONE-ACETAMINOPHEN 5-325 MG PO TABS: 1.0000 | ORAL_TABLET | Freq: Two times a day (BID) | ORAL | Status: DC | PRN

## 2013-06-23 NOTE — Assessment & Plan Note (Signed)
Well-controlled.  Continue current regimen. 

## 2013-06-23 NOTE — Assessment & Plan Note (Signed)
-  Continue Xarelto -No further evidence of bleeding. Check CBC today to ensure stability of Hgb -Oxycodone 5-325mg  BID prn #60 ordered today for pain control -f/u in office in 1 month

## 2013-06-23 NOTE — Patient Instructions (Addendum)
It was great to see you again today!  I gave you 1 months worth of oxycodone. Follow up in 1 month.  I will call you if your test results are not normal.  Otherwise, I will send you a letter.  If you do not hear from me with in 2 weeks please call our office.      I think it's okay to stop the allopurinol. We will check your uric acid levels today though.  Be well, Dr. [name]

## 2013-06-23 NOTE — Progress Notes (Signed)
Patient ID: Ray [name], male   DOB: 01-13-1956, 58 y.o.   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 Ardis Hughs recently. Pt reports that no biopsies were taken, that the area seen on his pancreas appeared to just be scar tissue. Reportedly the thrombosis was thought to have likely been present for a long time. He is now back on xarelto and has not had any further blood in his urine or in his stool. He was previously told to stop taking his daily baby aspirin. Denies dizziness, lightheadedness, shortness of breath, chest pain. He's having significant pain from the thrombosis. Its 8-9 out of 10 when he does not have adequate pain control. He took oxycodone upon discharge from the hospital, which significantly helped his pain and decreased it to a 3/10 within 30 minutes of taking it. He has also tried norco which did not help at all. He took his last oxycodone last night, and needs a refill. He's had decreased appetite from the pain, and has lost about 20 lbs.  HTN: currently taking losartan 100mg  daily, amlodipine 10mg  daily. Doing well. No CP, SOB, swelling in legs.  Gout: was told to stop taking the allopurinol after his GI procedure. He is wondering whether he should resume this or not. Has not had a gout flare in at least 3-4 years.  ROS: See HPI  [place]: hx HTN, chronic neck/back pain  PHYSICAL EXAM: BP 133/80  Pulse 91  Temp(Src) 99.1 F (37.3 C) (Oral)  Ht 5\' 10"  (1.778 m)  Wt 194 lb (87.998 kg)  BMI 27.84 kg/m2 Gen: NAD HEENT: NCAT Heart: RRR, no murmurs Lungs: CTAB, NWOB Abdomen: soft. Moderately TTP in LUQ. No appreciable organomegaly. No peritoneal signs. NABS. Neuro: grossly nonfocal, speech normal  ASSESSMENT/PLAN:  See problem based charting for additional assessment/plan.  FOLLOW UP: F/u in 1 month for splenic vein thrombosis  [place] J. [name], Mazie

## 2013-06-23 NOTE — Assessment & Plan Note (Signed)
No flares in 3-4 years at least. Was told to stop allopurinol after GI procedure. I'm not sure why he was told this. Will check uric acid level. As llong as it is not markedly elevated, it's reasonable to stop the allopurinol an dmonitor for recurrence of gout.

## 2013-06-29 ENCOUNTER — Telehealth: Payer: Self-pay | Admitting: Gastroenterology

## 2013-06-30 NOTE — Telephone Encounter (Signed)
Pt having abd pain and would like to be put on wait list.  He has appt for 08/03/13.

## 2013-07-05 ENCOUNTER — Telehealth: Payer: Self-pay | Admitting: Gastroenterology

## 2013-07-05 NOTE — Telephone Encounter (Signed)
Ray [name], He is doing poorly, continuing to lose weight. I'd like to get him in sooner for rov with me (double book if needed)

## 2013-07-05 NOTE — Telephone Encounter (Signed)
Dr. Ardis Hughs there was a cancellation for tomorrow at 2:15pm. Pt scheduled for OV with you 07/06/13@2 :15pm. Pt aware of appt.

## 2013-07-06 ENCOUNTER — Ambulatory Visit (INDEPENDENT_AMBULATORY_CARE_PROVIDER_SITE_OTHER): Admitting: Gastroenterology

## 2013-07-06 ENCOUNTER — Other Ambulatory Visit (INDEPENDENT_AMBULATORY_CARE_PROVIDER_SITE_OTHER)

## 2013-07-06 ENCOUNTER — Other Ambulatory Visit: Payer: Self-pay | Admitting: Family Medicine

## 2013-07-06 ENCOUNTER — Encounter: Payer: Self-pay | Admitting: Gastroenterology

## 2013-07-06 VITALS — BP 100/70 | HR 100 | Ht 70.0 in | Wt 186.6 lb

## 2013-07-06 DIAGNOSIS — R634 Abnormal weight loss: Secondary | ICD-10-CM

## 2013-07-06 DIAGNOSIS — C259 Malignant neoplasm of pancreas, unspecified: Secondary | ICD-10-CM

## 2013-07-06 DIAGNOSIS — R933 Abnormal findings on diagnostic imaging of other parts of digestive tract: Secondary | ICD-10-CM

## 2013-07-06 HISTORY — DX: Malignant neoplasm of pancreas, unspecified: C25.9

## 2013-07-06 LAB — CBC WITH DIFFERENTIAL/PLATELET
BASOS ABS: 0 10*3/uL (ref 0.0–0.1)
BASOS PCT: 0.4 % (ref 0.0–3.0)
EOS ABS: 0 10*3/uL (ref 0.0–0.7)
Eosinophils Relative: 0.1 % (ref 0.0–5.0)
HCT: 45.1 % (ref 39.0–52.0)
Hemoglobin: 14.7 g/dL (ref 13.0–17.0)
LYMPHS PCT: 28.6 % (ref 12.0–46.0)
Lymphs Abs: 2.3 10*3/uL (ref 0.7–4.0)
MCHC: 32.5 g/dL (ref 30.0–36.0)
MCV: 86.3 fl (ref 78.0–100.0)
Monocytes Absolute: 0.7 10*3/uL (ref 0.1–1.0)
Monocytes Relative: 8.7 % (ref 3.0–12.0)
Neutro Abs: 4.9 10*3/uL (ref 1.4–7.7)
Neutrophils Relative %: 62.2 % (ref 43.0–77.0)
Platelets: 208 10*3/uL (ref 150.0–400.0)
RBC: 5.23 Mil/uL (ref 4.22–5.81)
RDW: 14.8 % (ref 11.5–15.5)
WBC: 7.9 10*3/uL (ref 4.0–10.5)

## 2013-07-06 LAB — COMPREHENSIVE METABOLIC PANEL
ALBUMIN: 4.7 g/dL (ref 3.5–5.2)
ALT: 17 U/L (ref 0–53)
AST: 15 U/L (ref 0–37)
Alkaline Phosphatase: 76 U/L (ref 39–117)
BUN: 11 mg/dL (ref 6–23)
CHLORIDE: 101 meq/L (ref 96–112)
CO2: 29 mEq/L (ref 19–32)
Calcium: 9.9 mg/dL (ref 8.4–10.5)
Creatinine, Ser: 1.2 mg/dL (ref 0.4–1.5)
GFR: 78.3 mL/min (ref >60.00)
Glucose, Bld: 120 mg/dL — ABNORMAL HIGH (ref 70–99)
Potassium: 4.8 mEq/L (ref 3.5–5.1)
SODIUM: 138 meq/L (ref 135–145)
TOTAL PROTEIN: 8 g/dL (ref 6.0–8.3)
Total Bilirubin: 0.8 mg/dL (ref 0.2–1.2)

## 2013-07-06 LAB — LIPASE: Lipase: 15 U/L (ref 11.0–59.0)

## 2013-07-06 MED ORDER — HYDROCODONE-ACETAMINOPHEN 10-325 MG PO TABS: 1.0000 | ORAL_TABLET | Freq: Four times a day (QID) | ORAL | Status: DC | PRN

## 2013-07-06 NOTE — Patient Instructions (Addendum)
You will have labs checked today in the basement lab.  Please head down after you check out with the front desk  (CA 19-9, lipase, cmet, cbc) You will be set up for a CT scan of abdomen and pelvis with IV and oral contrast (pancreatic protocol). You have been scheduled for a CT scan of the abdomen and pelvis at [facility] ([address]---this is in the same building as [facility]).   You are scheduled on 07/09/13 at 3 pm. You should arrive 1 hour prior to your appointment time for registration. Please follow the written instructions below on the day of your exam:  WARNING: IF YOU ARE ALLERGIC TO IODINE/X-RAY DYE, PLEASE NOTIFY RADIOLOGY IMMEDIATELY AT [phone]! YOU WILL BE GIVEN A 13 HOUR PREMEDICATION PREP.  1) Do not eat or drink anything after 11 am (4 hours prior to your test)

## 2013-07-06 NOTE — Progress Notes (Signed)
Review of pertinent gastrointestinal problems: 1. Abd pain: 05/2013 splenic vein thrombosis, ?chronic pancreatitis on CT and MRI without clear underlying pancreatic mass on either test.  EUS Dr. Jacobs 06/2013 suggested chronic pancreatitis, no mass lesions.  Also noted food in stomach, gastroparesis> 2. Splenic vein thrombosis, anticoagulated per PCP.   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 I last saw the time upper endoscopic ultrasound, see those results summarized above.  A lot of abdominal pain.  Worse in LUQ usually.  Feels poorly. Spit some blood out with brushing his teeth.  Really pain started 2-3 months ago.  Has lost 23 pounds in 2 months.  Past Medical History  Diagnosis Date  . Blood transfusion   . GERD (gastroesophageal reflux disease)   . Hyperlipidemia   . Hypertension   . H/O hiatal hernia   . Arthritis     in his back, hips, knees, R shoulder   . Colon polyps     TUBULAR ADENOMA  . Splenic vein thrombosis   . Liver cyst   . Hx of pancreatitis   . Gout     Past Surgical History  Procedure Laterality Date  . Hip surgery      x2 right hip- retained hardware ambulates with cane  . Shoulder surgery      right  . Hand surgery      left thumb tendon repair  . Femur hardware removal  2011    R femur- prothesis removed & replaced  . Posterior cervical fusion/foraminotomy N/A 04/20/2012    Procedure: POSTERIOR CERVICAL FUSION/FORAMINOTOMY LEVEL 1;  Surgeon: Jeffrey D Jenkins, MD;  Location: [facility];  Service: Neurosurgery;  Laterality: N/A;  C1/2 laminectomies with posterior screw fixation  . Colonoscopy w/ biopsies  04/15/2011  . Eus N/A 06/10/2013    Procedure: UPPER ENDOSCOPIC ULTRASOUND (EUS) LINEAR;  Surgeon: Daniel P Jacobs, MD;  Location: [facility];  Service: Endoscopy;  Laterality: N/A;    Current Outpatient Prescriptions  Medication Sig Dispense Refill  .  amLODipine (NORVASC) 10 MG tablet Take 10 mg by mouth every morning.       . losartan (COZAAR) 100 MG tablet Take 100 mg by mouth every morning.       . omeprazole (PRILOSEC) 40 MG capsule Take 40 mg by mouth daily.      . oxyCODONE-acetaminophen (PERCOCET/ROXICET) 5-325 MG per tablet Take 1 tablet by mouth 2 (two) times daily as needed for severe pain.  60 tablet  0  . rivaroxaban (XARELTO) 20 MG TABS tablet Take 1 tablet (20 mg total) by mouth daily with supper.  30 tablet  2  . sildenafil (VIAGRA) 50 MG tablet Take 50 mg by mouth daily as needed for erectile dysfunction.       No current facility-administered medications for this visit.    Allergies as of 07/06/2013  . (No Known Allergies)    Family History  Problem Relation Age of Onset  . Colon cancer Neg Hx   . Esophageal cancer Neg Hx   . Stomach cancer Neg Hx   . Rectal cancer Neg Hx     History   Social History  . Marital Status: Married    Spouse Name: N/A    Number of Children: 3  . Years of Education: N/A   Occupational History  . disabled [employer]   Social History Main Topics  .   Smoking status: Current Every Day Smoker    Types: Cigars  . Smokeless tobacco: Never Used     Comment: one a day cigar x 6 months. Quit cigarettes -20 yrs ago.  . Alcohol Use: No     Comment: Quit heavy use 30 yrs ago.none now  . Drug Use: No  . Sexual Activity: Not on file   Other Topics Concern  . Not on file   Social History Narrative  . No narrative on file      Physical Exam: BP 100/70  Pulse 100  Ht 5' 10" (1.778 m)  Wt 186 lb 9.6 oz (84.641 kg)  BMI 26.77 kg/m2 Constitutional: generally well-appearing Psychiatric: alert and oriented x3 Abdomen: soft, slightly tender left upper quadrant, nondistended, no obvious ascites, no peritoneal signs, normal bowel sounds     Assessment and plan: 58 y.o. male with abnormal pancreas on imaging, splenic vein thrombosis, weight loss  He is still in significant  left-sided abdominal pains sometimes radiate to his back. He has lost 20-25 pounds in past 6 weeks. I'm concerned about underlying neoplasm that has been difficult to diagnose do to chronic pancreatitis. I'm going to arrange for repeat CT scan, pancreatic protocol as well as lab tests summarized below. Allow a low threshold to repeat his endoscopic ultrasound. He is on blood thinners and obviously the last be held again he does need  repeat endoscopic ultrasound.  

## 2013-07-07 ENCOUNTER — Telehealth: Payer: Self-pay

## 2013-07-07 ENCOUNTER — Encounter (HOSPITAL_COMMUNITY): Payer: Self-pay | Admitting: *Deleted

## 2013-07-07 ENCOUNTER — Encounter (HOSPITAL_COMMUNITY): Payer: Self-pay | Admitting: Pharmacy Technician

## 2013-07-07 ENCOUNTER — Other Ambulatory Visit: Payer: Self-pay

## 2013-07-07 DIAGNOSIS — K861 Other chronic pancreatitis: Secondary | ICD-10-CM

## 2013-07-07 LAB — CANCER ANTIGEN 19-9: CA 19-9: 2067.6 U/mL — ABNORMAL HIGH (ref <35.0)

## 2013-07-07 NOTE — Telephone Encounter (Signed)
Yes, but we should be able to hold it long enough after seeing ct results (friday)

## 2013-07-07 NOTE — Telephone Encounter (Signed)
Message copied by Barron Alvine on Wed Jul 07, 2013  8:02 AM ------      Message from: Owens Loffler P      Created: Wed Jul 07, 2013  7:51 AM       Can you book him for upper eus next Thursday (60 min, radial +/- linear, MAC+), I'm awaiting his repeat CT before making final decision but want him on just in case.  Don't let him know until after I see CT, I'll call him. ------

## 2013-07-07 NOTE — Telephone Encounter (Signed)
I messaged the precert team to NOT call the pt until after Dr Ardis Hughs speaks to the pt.  Also, Dr Ardis Hughs the pt is on Xarelto will he need to hold?

## 2013-07-07 NOTE — Telephone Encounter (Signed)
I will send a letter to PCP

## 2013-07-08 ENCOUNTER — Telehealth: Payer: Self-pay

## 2013-07-08 NOTE — Telephone Encounter (Signed)
Have you called the pt?

## 2013-07-08 NOTE — Telephone Encounter (Signed)
PM  Pt called for you about appts that are set up.  He can be reached in the morning on 07/08/13 @ [phone]

## 2013-07-08 NOTE — Telephone Encounter (Signed)
See alternate phone note  

## 2013-07-08 NOTE — Telephone Encounter (Signed)
Message copied by Barron Alvine on Thu Jul 08, 2013  8:22 AM ------      Message from: HAZELWOOD, AMY L      Created: Wed Jul 07, 2013  1:49 PM       Pt called for you about appts that are set up.            He can be reached in the morning on 07/08/13 @ [phone]            Thanks Amy ------

## 2013-07-08 NOTE — Telephone Encounter (Signed)
We spoke.  I will contact him again after CT tomorrow afternoon.

## 2013-07-09 ENCOUNTER — Other Ambulatory Visit: Payer: Self-pay

## 2013-07-09 ENCOUNTER — Telehealth: Payer: Self-pay | Admitting: *Deleted

## 2013-07-09 ENCOUNTER — Ambulatory Visit (INDEPENDENT_AMBULATORY_CARE_PROVIDER_SITE_OTHER)
Admission: RE | Admit: 2013-07-09 | Discharge: 2013-07-09 | Disposition: A | Discharge status: Home / Self Care | Source: Ambulatory Visit | Attending: Gastroenterology | Admitting: Gastroenterology

## 2013-07-09 DIAGNOSIS — R933 Abnormal findings on diagnostic imaging of other parts of digestive tract: Secondary | ICD-10-CM

## 2013-07-09 DIAGNOSIS — C259 Malignant neoplasm of pancreas, unspecified: Secondary | ICD-10-CM

## 2013-07-09 DIAGNOSIS — R634 Abnormal weight loss: Secondary | ICD-10-CM

## 2013-07-09 MED ORDER — IOHEXOL 350 MG/ML SOLN
100.0000 mL | Freq: Once | INTRAVENOUS | Status: AC | PRN
  Administered 2013-07-09: 100 mL via INTRAVENOUS

## 2013-07-09 NOTE — Telephone Encounter (Signed)
Spoke with patient by phone and confirmed appointment with [name] for 07/21/13.  Contact names, numbers, and directions were provided.

## 2013-07-10 ENCOUNTER — Other Ambulatory Visit: Payer: Self-pay | Admitting: Family Medicine

## 2013-07-15 ENCOUNTER — Encounter: Payer: Self-pay | Admitting: Family Medicine

## 2013-07-15 ENCOUNTER — Ambulatory Visit (HOSPITAL_COMMUNITY)
Admission: RE | Admit: 2013-07-15 | Discharge: 2013-07-15 | Disposition: A | Discharge status: Home / Self Care | Source: Ambulatory Visit | Attending: Gastroenterology | Admitting: Gastroenterology

## 2013-07-15 ENCOUNTER — Ambulatory Visit (HOSPITAL_COMMUNITY): Admitting: *Deleted

## 2013-07-15 ENCOUNTER — Encounter (HOSPITAL_COMMUNITY): Payer: Self-pay | Admitting: *Deleted

## 2013-07-15 ENCOUNTER — Encounter (HOSPITAL_COMMUNITY)
Admission: RE | Disposition: A | Discharge status: Home / Self Care | Payer: Self-pay | Source: Ambulatory Visit | Attending: Gastroenterology

## 2013-07-15 ENCOUNTER — Encounter (HOSPITAL_COMMUNITY): Admitting: *Deleted

## 2013-07-15 DIAGNOSIS — D649 Anemia, unspecified: Secondary | ICD-10-CM | POA: Insufficient documentation

## 2013-07-15 DIAGNOSIS — K449 Diaphragmatic hernia without obstruction or gangrene: Secondary | ICD-10-CM | POA: Insufficient documentation

## 2013-07-15 DIAGNOSIS — C257 Malignant neoplasm of other parts of pancreas: Secondary | ICD-10-CM | POA: Insufficient documentation

## 2013-07-15 DIAGNOSIS — I10 Essential (primary) hypertension: Secondary | ICD-10-CM | POA: Insufficient documentation

## 2013-07-15 DIAGNOSIS — K861 Other chronic pancreatitis: Secondary | ICD-10-CM

## 2013-07-15 DIAGNOSIS — I739 Peripheral vascular disease, unspecified: Secondary | ICD-10-CM | POA: Insufficient documentation

## 2013-07-15 DIAGNOSIS — K219 Gastro-esophageal reflux disease without esophagitis: Secondary | ICD-10-CM | POA: Insufficient documentation

## 2013-07-15 DIAGNOSIS — I82890 Acute embolism and thrombosis of other specified veins: Secondary | ICD-10-CM

## 2013-07-15 DIAGNOSIS — D7389 Other diseases of spleen: Secondary | ICD-10-CM

## 2013-07-15 DIAGNOSIS — Z79899 Other long term (current) drug therapy: Secondary | ICD-10-CM | POA: Insufficient documentation

## 2013-07-15 DIAGNOSIS — F17200 Nicotine dependence, unspecified, uncomplicated: Secondary | ICD-10-CM | POA: Insufficient documentation

## 2013-07-15 HISTORY — PX: EUS: SHX5427

## 2013-07-15 SURGERY — ESOPHAGEAL ENDOSCOPIC ULTRASOUND (EUS) RADIAL
Anesthesia: Monitor Anesthesia Care | Laterality: Bilateral

## 2013-07-15 MED ORDER — BUPIVACAINE HCL (PF) 0.25 % IJ SOLN
INTRAMUSCULAR | Status: AC
  Filled 2013-07-15: qty 30

## 2013-07-15 MED ORDER — PROPOFOL 10 MG/ML IV BOLUS
INTRAVENOUS | Status: AC
  Filled 2013-07-15: qty 20

## 2013-07-15 MED ORDER — METOCLOPRAMIDE HCL 5 MG/ML IJ SOLN
INTRAMUSCULAR | Status: AC
  Filled 2013-07-15: qty 2

## 2013-07-15 MED ORDER — LACTATED RINGERS IV SOLN
INTRAVENOUS | Status: DC | PRN
  Administered 2013-07-15 (×2): via INTRAVENOUS

## 2013-07-15 MED ORDER — LIDOCAINE HCL 1 % IJ SOLN
INTRAMUSCULAR | Status: DC | PRN
  Administered 2013-07-15: 50 mg via INTRADERMAL

## 2013-07-15 MED ORDER — FENTANYL CITRATE 0.05 MG/ML IJ SOLN
INTRAMUSCULAR | Status: AC
  Filled 2013-07-15: qty 2

## 2013-07-15 MED ORDER — FENTANYL CITRATE 0.05 MG/ML IJ SOLN
50.0000 ug | Freq: Once | INTRAMUSCULAR | Status: AC
  Administered 2013-07-15: 50 ug via INTRAVENOUS

## 2013-07-15 MED ORDER — LACTATED RINGERS IV SOLN
INTRAVENOUS | Status: DC
  Administered 2013-07-15: 1000 mL via INTRAVENOUS

## 2013-07-15 MED ORDER — SODIUM CHLORIDE 0.9 % IV SOLN: INTRAVENOUS | Status: DC

## 2013-07-15 MED ORDER — ONDANSETRON HCL 4 MG/2ML IJ SOLN
INTRAMUSCULAR | Status: AC
  Filled 2013-07-15: qty 2

## 2013-07-15 MED ORDER — KETAMINE HCL 10 MG/ML IJ SOLN
INTRAMUSCULAR | Status: DC | PRN
  Administered 2013-07-15 (×2): 10 mg via INTRAVENOUS

## 2013-07-15 MED ORDER — METOCLOPRAMIDE HCL 5 MG/ML IJ SOLN
INTRAMUSCULAR | Status: DC | PRN
  Administered 2013-07-15: 10 mg via INTRAVENOUS

## 2013-07-15 MED ORDER — MIDAZOLAM HCL 5 MG/5ML IJ SOLN
INTRAMUSCULAR | Status: DC | PRN
  Administered 2013-07-15 (×2): 1 mg via INTRAVENOUS

## 2013-07-15 MED ORDER — BUTAMBEN-TETRACAINE-BENZOCAINE 2-2-14 % EX AERO
INHALATION_SPRAY | CUTANEOUS | Status: DC | PRN
  Administered 2013-07-15: 1 via TOPICAL

## 2013-07-15 MED ORDER — LACTATED RINGERS IV SOLN
INTRAVENOUS | Status: DC
Start: 2013-07-15 — End: 2013-07-15

## 2013-07-15 MED ORDER — ALCOHOL 98 % IV SOLN
20.0000 mL | Freq: Once | INTRAVENOUS | Status: DC
  Filled 2013-07-15: qty 20

## 2013-07-15 MED ORDER — ONDANSETRON HCL 4 MG/2ML IJ SOLN
INTRAMUSCULAR | Status: DC | PRN
  Administered 2013-07-15: 4 mg via INTRAVENOUS

## 2013-07-15 MED ORDER — PROMETHAZINE HCL 25 MG/ML IJ SOLN: 6.2500 mg | INTRAMUSCULAR | Status: DC | PRN

## 2013-07-15 MED ORDER — MIDAZOLAM HCL 2 MG/2ML IJ SOLN
INTRAMUSCULAR | Status: AC
  Filled 2013-07-15: qty 2

## 2013-07-15 MED ORDER — OXYCODONE-ACETAMINOPHEN 10-325 MG PO TABS: 1.0000 | ORAL_TABLET | Freq: Four times a day (QID) | ORAL | Status: DC | PRN

## 2013-07-15 MED ORDER — PROPOFOL INFUSION 10 MG/ML OPTIME
INTRAVENOUS | Status: DC | PRN
  Administered 2013-07-15: 120 ug/kg/min via INTRAVENOUS

## 2013-07-15 NOTE — H&P (View-Only) (Signed)
Review of pertinent gastrointestinal problems: 1. Abd pain: 05/2013 splenic vein thrombosis, ?chronic pancreatitis on CT and MRI without clear underlying pancreatic mass on either test.  EUS Dr. Ardis Hughs 06/2013 suggested chronic pancreatitis, no mass lesions.  Also noted food in stomach, gastroparesis> 2. Splenic vein thrombosis, anticoagulated per PCP.   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 I last saw the time upper endoscopic ultrasound, see those results summarized above.  A lot of abdominal pain.  Worse in LUQ usually.  Feels poorly. Spit some blood out with brushing his teeth.  Really pain started 2-3 months ago.  Has lost 23 pounds in 2 months.  Past Medical History  Diagnosis Date  . Blood transfusion   . GERD (gastroesophageal reflux disease)   . Hyperlipidemia   . Hypertension   . H/O hiatal hernia   . Arthritis     in his back, hips, knees, R shoulder   . Colon polyps     TUBULAR ADENOMA  . Splenic vein thrombosis   . Liver cyst   . Hx of pancreatitis   . Gout     Past Surgical History  Procedure Laterality Date  . Hip surgery      x2 right hip- retained hardware ambulates with cane  . Shoulder surgery      right  . Hand surgery      left thumb tendon repair  . Femur hardware removal  2011    R femur- prothesis removed & replaced  . Posterior cervical fusion/foraminotomy N/A 04/20/2012    Procedure: POSTERIOR CERVICAL FUSION/FORAMINOTOMY LEVEL 1;  Surgeon: Ophelia Charter, MD;  Location: [facility];  Service: Neurosurgery;  Laterality: N/A;  C1/2 laminectomies with posterior screw fixation  . Colonoscopy w/ biopsies  04/15/2011  . Eus N/A 06/10/2013    Procedure: UPPER ENDOSCOPIC ULTRASOUND (EUS) LINEAR;  Surgeon: Milus Banister, MD;  Location: [facility];  Service: Endoscopy;  Laterality: N/A;    Current Outpatient Prescriptions  Medication Sig Dispense Refill  .  amLODipine (NORVASC) 10 MG tablet Take 10 mg by mouth every morning.       Marland Kitchen losartan (COZAAR) 100 MG tablet Take 100 mg by mouth every morning.       Marland Kitchen omeprazole (PRILOSEC) 40 MG capsule Take 40 mg by mouth daily.      Marland Kitchen oxyCODONE-acetaminophen (PERCOCET/ROXICET) 5-325 MG per tablet Take 1 tablet by mouth 2 (two) times daily as needed for severe pain.  60 tablet  0  . rivaroxaban (XARELTO) 20 MG TABS tablet Take 1 tablet (20 mg total) by mouth daily with supper.  30 tablet  2  . sildenafil (VIAGRA) 50 MG tablet Take 50 mg by mouth daily as needed for erectile dysfunction.       No current facility-administered medications for this visit.    Allergies as of 07/06/2013  . (No Known Allergies)    Family History  Problem Relation Age of Onset  . Colon cancer Neg Hx   . Esophageal cancer Neg Hx   . Stomach cancer Neg Hx   . Rectal cancer Neg Hx     History   Social History  . Marital Status: Married    Spouse Name: N/A    Number of Children: 3  . Years of Education: N/A   Occupational History  . disabled [employer]   Social History Main Topics  .  Smoking status: Current Every Day Smoker    Types: Cigars  . Smokeless tobacco: Never Used     Comment: one a day cigar x 6 months. Quit cigarettes -20 yrs ago.  . Alcohol Use: No     Comment: Quit heavy use 30 yrs ago.none now  . Drug Use: No  . Sexual Activity: Not on file   Other Topics Concern  . Not on file   Social History Narrative  . No narrative on file      Physical Exam: BP 100/70  Pulse 100  Ht 5\' 10"  (1.778 m)  Wt 186 lb 9.6 oz (84.641 kg)  BMI 26.77 kg/m2 Constitutional: generally well-appearing Psychiatric: alert and oriented x3 Abdomen: soft, slightly tender left upper quadrant, nondistended, no obvious ascites, no peritoneal signs, normal bowel sounds     Assessment and plan: 58 y.o. male with abnormal pancreas on imaging, splenic vein thrombosis, weight loss  He is still in significant  left-sided abdominal pains sometimes radiate to his back. He has lost 20-25 pounds in past 6 weeks. I'm concerned about underlying neoplasm that has been difficult to diagnose do to chronic pancreatitis. I'm going to arrange for repeat CT scan, pancreatic protocol as well as lab tests summarized below. Allow a low threshold to repeat his endoscopic ultrasound. He is on blood thinners and obviously the last be held again he does need  repeat endoscopic ultrasound.

## 2013-07-15 NOTE — Anesthesia Postprocedure Evaluation (Signed)
Anesthesia Post Note  Patient: Ray [name]  Procedure(s) Performed: Procedure(s) (LRB): ESOPHAGEAL ENDOSCOPIC ULTRASOUND (EUS) RADIAL (Bilateral)  Anesthesia type: MAC  Patient location: PACU  Post pain: Pain level controlled  Post assessment: Post-op Vital signs reviewed  Last Vitals:  Filed Vitals:   07/15/13 1430  BP: 143/85  Pulse: 73  Temp:   Resp: 16    Post vital signs: Reviewed  Level of consciousness: sedated  Complications: No apparent anesthesia complications

## 2013-07-15 NOTE — Op Note (Signed)
[facility] [place], [address]   ENDOSCOPIC ULTRASOUND PROCEDURE REPORT  PATIENT: Ray [name], Ray [name]  MR#: [id number] BIRTHDATE: November 25, 1955  GENDER: Male ENDOSCOPIST: Milus Banister, MD PROCEDURE DATE:  07/15/2013 PROCEDURE:   Upper EUS w/FNA ASA CLASS:      Class II INDICATIONS:   1.  splenic vein thrombosis 3 months ago, imaging (CT, MRI) at that time showed no clear masses; EUS 6 weeks ago suggested chronic pancreatitis (previous Etoh abuse but not in many years).  Past 4-5 weeks weight loss, CA 19-9 >2,000; repeat CT now shows abnormal soft tissue around celiac trunk. MEDICATIONS: MAC sedation, administered by CRNA  DESCRIPTION OF PROCEDURE:   After the risks benefits and alternatives of the procedure were  explained, informed consent was obtained. The patient was then placed in the left, lateral, decubitus postion and IV sedation was administered. Throughout the procedure, the patients blood pressure, pulse and oxygen saturations were monitored continuously.  Under direct visualization, the Pentax Radial EUS [id number]  endoscope was introduced through the mouth  and advanced to the second portion of the duodenum .  Water was used as necessary to provide an acoustic interface.  Upon completion of the imaging, water was removed and the patient was sent to the recovery room in satisfactory condition.   Endoscopic findings: 1. There was a moderate amount of sold and liquid food in his stomach 2. The UGI tract was otherwise normal  EUS findings: 1. The pancreas was abnormal with dilated main pancreatic duct (body, tail) , honeycombed appearance to the parenchyma throughout the gland.  In region of the uncinate pancreas there was very vague area that was somewhat mass-like, measured about 2.5cm across. There were no clear borders of this mass.  This area was sampled with 5 transgastric passes with 25 guage EUS FNA needle. 2. I could not  appreciate the soft tissue around the celiac trunk that is described on recent CT scan.  Impression: Very vague mass like lesion in region of the uncinate pancreas, preliminary cytology review shows atypical cells that were suggestion of neoplasm but not conclusive for malignancy.  The soft tissue surrounding celiac trunk was not we visualized on this exam. 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  Await final pathology result. He already has appointment with [name] next week and his case is scheduled to be discussed at GI cancer conference as well next week.     _______________________________ eSigned:  Milus Banister, MD 07/15/2013 1:41 PM

## 2013-07-15 NOTE — Discharge Instructions (Signed)
YOU HAD AN ENDOSCOPIC PROCEDURE TODAY: Refer to the procedure report that was given to you for any specific questions about what was found during the examination.  If the procedure report does not answer your questions, please call your gastroenterologist to clarify.  YOU SHOULD EXPECT: Some feelings of bloating in the abdomen. Passage of more gas than usual.  Walking can help get rid of the air that was put into your GI tract during the procedure and reduce the bloating. If you had a lower endoscopy (such as a colonoscopy or flexible sigmoidoscopy) you may notice spotting of blood in your stool or on the toilet paper.   DIET: Your first meal following the procedure should be a light meal and then it is ok to progress to your normal diet.  A half-sandwich or bowl of soup is an example of a good first meal.  Heavy or fried foods are harder to digest and may make you feel nasueas or bloated.  Drink plenty of fluids but you should avoid alcoholic beverages for 24 hours.  ACTIVITY: Your care partner should take you home directly after the procedure.  You should plan to take it easy, moving slowly for the rest of the day.  You can resume normal activity the day after the procedure however you should NOT DRIVE or use heavy machinery for 24 hours (because of the sedation medicines used during the test).    SYMPTOMS TO REPORT IMMEDIATELY  A gastroenterologist can be reached at any hour.  Please call your doctor's office for any of the following symptoms:   Following lower endoscopy (colonoscopy, flexible sigmoidoscopy)  Excessive amounts of blood in the stool  Significant tenderness, worsening of abdominal pains  Swelling of the abdomen that is new, acute  Fever of 100 or higher  Following upper endoscopy (EGD, EUS, ERCP)  Vomiting of blood or coffee ground material  New, significant abdominal pain  New, significant chest pain or pain under the shoulder blades  Painful or persistently difficult  swallowing  New shortness of breath  Black, tarry-looking stools  FOLLOW UP: If any biopsies were taken you will be contacted by phone or by letter within the next 1-3 weeks.  Call your gastroenterologist if you have not heard about the biopsies in 3 weeks.  Please also call your gastroenterologist's office with any specific questions about appointments or follow up tests.     Esophagogastroduodenoscopy Care After Refer to this sheet in the next few weeks. These instructions provide you with information on caring for yourself after your procedure. Your caregiver may also give you more specific instructions. Your treatment has been planned according to current medical practices, but problems sometimes occur. Call your caregiver if you have any problems or questions after your procedure.  HOME CARE INSTRUCTIONS  Do not eat or drink anything until the numbing medicine (local anesthetic) has worn off and your gag reflex has returned. You will know that the local anesthetic has worn off when you can swallow comfortably.  Do not drive for 12 hours after the procedure or as directed by your caregiver.  Only take medicines as directed by your caregiver. SEEK MEDICAL CARE IF:   You cannot stop coughing.  You are not urinating at all or less than usual. SEEK IMMEDIATE MEDICAL CARE IF:  You have difficulty swallowing.  You cannot eat or drink.  You have worsening throat or chest pain.  You have dizziness, lightheadedness, or you faint.  You have nausea or vomiting.  You have chills.  You have a fever.  You have severe abdominal pain.  You have black, tarry, or bloody stools. Document Released: 01/08/2012 Document Reviewed: 01/08/2012 [facility] Patient Information 2014 [place], [place].

## 2013-07-15 NOTE — Transfer of Care (Signed)
Immediate Anesthesia Transfer of Care Note  Patient: Ray [name]  Procedure(s) Performed: Procedure(s): ESOPHAGEAL ENDOSCOPIC ULTRASOUND (EUS) RADIAL (Bilateral)  Patient Location: PACU and Endoscopy Unit  Anesthesia Type:MAC  Level of Consciousness: awake, alert , oriented and patient cooperative  Airway & Oxygen Therapy: Patient Spontanous Breathing and Patient connected to nasal cannula oxygen  Post-op Assessment: Report given to PACU RN, Post -op Vital signs reviewed and stable and Patient moving all extremities  Post vital signs: Reviewed and stable  Complications: No apparent anesthesia complications

## 2013-07-15 NOTE — Interval H&P Note (Signed)
History and Physical Interval Note:  07/15/2013 11:35 AM  Ray [name]  has presented today for surgery, with the diagnosis of charro pancretics  The various methods of treatment have been discussed with the patient and family. After consideration of risks, benefits and other options for treatment, the patient has consented to  Procedure(s): ESOPHAGEAL ENDOSCOPIC ULTRASOUND (EUS) RADIAL (Bilateral) as a surgical intervention .  The patient's history has been reviewed, patient examined, no change in status, stable for surgery.  I have reviewed the patient's chart and labs.  Questions were answered to the patient's satisfaction.     Milus Banister

## 2013-07-15 NOTE — Anesthesia Preprocedure Evaluation (Addendum)
Anesthesia Evaluation  Patient identified by MRN, date of birth, ID band Patient awake    Reviewed: Allergy & Precautions, H&P , NPO status , Patient's Chart, lab work & pertinent test results, reviewed documented beta blocker date and time   Airway Mallampati: II TM Distance: >3 FB Neck ROM: Full    Dental no notable dental hx. (+) Teeth Intact, Dental Advisory Given   Pulmonary neg pulmonary ROS, Current Smoker,  breath sounds clear to auscultation  Pulmonary exam normal       Cardiovascular hypertension, Pt. on medications + Peripheral Vascular Disease Rhythm:Regular Rate:Normal     Neuro/Psych Raynaud's. Cervical fusion.  Neuromuscular disease negative psych ROS   GI/Hepatic Neg liver ROS, hiatal hernia, GERD-  Medicated and Poorly Controlled,Pancreatitis, chronic    Endo/Other  negative endocrine ROS  Renal/GU negative Renal ROS  negative genitourinary   Musculoskeletal negative musculoskeletal ROS (+)   Abdominal   Peds negative pediatric ROS (+)  Hematology negative hematology ROS (+) anemia ,   Anesthesia Other Findings   Reproductive/Obstetrics negative OB ROS                          Anesthesia Physical Anesthesia Plan  ASA: III  Anesthesia Plan: MAC   Post-op Pain Management:    Induction: Intravenous  Airway Management Planned: Nasal Cannula  Additional Equipment:   Intra-op Plan:   Post-operative Plan:   Informed Consent: I have reviewed the patients History and Physical, chart, labs and discussed the procedure including the risks, benefits and alternatives for the proposed anesthesia with the patient or authorized representative who has indicated his/her understanding and acceptance.   Dental advisory given  Plan Discussed with: CRNA  Anesthesia Plan Comments:         Anesthesia Quick Evaluation

## 2013-07-16 ENCOUNTER — Encounter (HOSPITAL_COMMUNITY): Payer: Self-pay | Admitting: Gastroenterology

## 2013-07-20 ENCOUNTER — Telehealth: Payer: Self-pay | Admitting: Oncology

## 2013-07-20 ENCOUNTER — Ambulatory Visit (HOSPITAL_BASED_OUTPATIENT_CLINIC_OR_DEPARTMENT_OTHER): Admitting: Nurse Practitioner

## 2013-07-20 ENCOUNTER — Ambulatory Visit

## 2013-07-20 VITALS — BP 111/71 | HR 89 | Temp 98.6°F | Resp 20 | Ht 70.0 in | Wt 187.0 lb

## 2013-07-20 DIAGNOSIS — G893 Neoplasm related pain (acute) (chronic): Secondary | ICD-10-CM

## 2013-07-20 DIAGNOSIS — C257 Malignant neoplasm of other parts of pancreas: Secondary | ICD-10-CM

## 2013-07-20 DIAGNOSIS — C259 Malignant neoplasm of pancreas, unspecified: Secondary | ICD-10-CM

## 2013-07-20 DIAGNOSIS — I10 Essential (primary) hypertension: Secondary | ICD-10-CM

## 2013-07-20 MED ORDER — MORPHINE SULFATE ER 30 MG PO TBCR: 30.0000 mg | EXTENDED_RELEASE_TABLET | Freq: Two times a day (BID) | ORAL | Status: DC

## 2013-07-20 MED ORDER — OXYCODONE-ACETAMINOPHEN 10-325 MG PO TABS: 1.0000 | ORAL_TABLET | Freq: Four times a day (QID) | ORAL | Status: DC | PRN

## 2013-07-20 NOTE — Telephone Encounter (Signed)
gv and printed appt sched and avs for pt fro June....emailed MW to add tx.

## 2013-07-20 NOTE — Telephone Encounter (Signed)
S/W PATIENT AND GAVE NEW D/T FOR 06/16 @ 1:45, PATIENT CONFIRMED APPT.

## 2013-07-20 NOTE — Telephone Encounter (Signed)
tiffany from IR will call pt with appt once she has contacted Dr. that prescribed blood thinner.Marland KitchenMarland KitchenMarland Kitchen

## 2013-07-20 NOTE — Progress Notes (Addendum)
[facility] New Patient Consult   Referring MD: Dr. Owens Loffler.   BARTON WANT 58 y.o.  1955-10-30    Reason for Referral: Pancreas cancer.   HPI: Mr. Bremer is a 58 year old man who presented in April of this year with an approximate 2 week history of left-sided abdominal pain radiating to the back.   CT abdomen/pelvis on 05/16/2013 showed a near occlusive thrombus within the splenic vein with extension into the portal venous confluence. There were prominent splenic collaterals draining into the perigastric veins. The pancreatic tail and distal body appeared atrophic. There was prominence of the pancreatic duct in that portion of the pancreas. More proximally the pancreatic duct was not well demonstrated. A discrete pancreatic head or body mass was not demonstrated. Tiny liver cysts were noted. Gallbladder was mildly distended without evidence of stones or acute inflammation.  Abdominal MRI on 05/17/2013 begin showed splenic vein thrombosis with associated subocclusive thrombus extending into the portal vein. There was irregular dilatation of the pancreatic duct in the body/tail. No pancreatic mass was seen. There was no suspicious abdominal lymphadenopathy. There were scattered small hepatic cysts.  Endoscopic ultrasound on 06/10/2013 showed pancreatic parenchymal and pancreatic duct findings consistent with chronic pancreatitis. There was no discrete solid mass lesion in the pancreas and no peripancreatic adenopathy.  He continued to have abdominal pain. Repeat CT abdomen/pelvis on 07/09/2013 showed interval development of amorphous soft tissue circumferentially encasing the celiac axis and tracking caudally down toward the SMA. There was associated stable irregular mild distention of the main pancreatic duct in the body and tail of the pancreas although no discrete mass lesion was identified within the pancreatic parenchyma.  Repeat endoscopic ultrasound on  07/15/2013 showed a very vague masslike lesion in the region of the uncinate pancreas. Fine needle aspiration showed atypical cells consistent with adenocarcinoma.  CA 19-9 was elevated at 2067 on 07/06/2013.  Past Medical History  Diagnosis Date  . Blood transfusion   . GERD (gastroesophageal reflux disease)   . Hyperlipidemia   . Hypertension   . H/O hiatal hernia   . Arthritis     in his back, hips, knees, R shoulder   . Colon polyps     TUBULAR ADENOMA  . Splenic vein thrombosis   . Liver cyst   . Hx of pancreatitis   . Gout     Past Surgical History  Procedure Laterality Date  . Hip surgery      x2 right hip- retained hardware ambulates with cane  . Shoulder surgery      right  . Hand surgery      left thumb tendon repair  . Femur hardware removal  2011    R femur- prothesis removed & replaced  . Posterior cervical fusion/foraminotomy N/A 04/20/2012    Procedure: POSTERIOR CERVICAL FUSION/FORAMINOTOMY LEVEL 1;  Surgeon: Ophelia Charter, MD;  Location: [facility];  Service: Neurosurgery;  Laterality: N/A;  C1/2 laminectomies with posterior screw fixation  . Colonoscopy w/ biopsies  04/15/2011  . Eus N/A 06/10/2013    Procedure: UPPER ENDOSCOPIC ULTRASOUND (EUS) LINEAR;  Surgeon: Milus Banister, MD;  Location: [facility];  Service: Endoscopy;  Laterality: N/A;  . Eus Bilateral 07/15/2013    Procedure: ESOPHAGEAL ENDOSCOPIC ULTRASOUND (EUS) RADIAL;  Surgeon: Milus Banister, MD;  Location: [facility];  Service: Endoscopy;  Laterality: Bilateral;    Medications: Reviewed  Allergies: No Known Allergies  Family history: Mother is living. She has had a stroke. She  has a history of hypertension. She has dementia. Father has COPD. Sister with hypertension. Sister with question history of thyroid cancer. Another sister is healthy.  Social History:   He lives in [place]. He is married. He has 3 daughters all reported to be in good health. He is retired from the  [employer]). 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 He is currently unemployed. He reports heavy alcohol use in the past. He quit drinking 25 years ago. He quit smoking cigarettes in 1992. About 8 months ago he began smoking one cigar a day.   ROS:  He reports presenting in April of this year with a 2 week history of a left-sided abdominal pain radiating to the back. The pain has persisted. He is currently taking oxycodone 3-4 times a day. He notes temporary relief of the pain. He periodically wakes up in pain. Appetite has been poor. He estimates losing 25 pounds over the past month. He denies any fevers, sweats or chills. No unusual headaches. No visual disturbance. He denies shortness of breath. No cough. No dysphagia. He denies chest pain. No nausea or vomiting. He has had recent constipation. He reports he will be beginning MiraLAX this week. He denies leg swelling or calf pain. No bleeding. No hematuria or dysuria. He has intermittent tingling in the fingers on the left hand.   Physical Exam:  Blood pressure 111/71, pulse 89, temperature 98.6 F (37 C), temperature source Oral, resp. rate 20, height 5\' 10"  (1.778 m), weight 187 lb (84.823 kg), SpO2 100.00%.  HEENT: Pupils equal round and reactive to light. Extraocular movements intact. Sclera anicteric. Oropharynx is without thrush or ulceration. Lungs: Clear bilaterally. Cardiac: Regular cardiac rhythm. Abdomen: Soft with tenderness over the left upper abdomen. No hepatomegaly. No definite abdominal mass.  Vascular: No leg edema. Calves soft and nontender. Lymph nodes: No palpable cervical, supraclavicular, axillary or inguinal lymph nodes. Neurologic: Vibratory sense mildly decreased over the fingertips on the left hand and intact over fingertips on the right hand. Skin: No rash.   LAB:  CBC  Lab Results  Component Value Date   WBC 7.9 07/06/2013   HGB 14.7  07/06/2013   HCT 45.1 07/06/2013   MCV 86.3 07/06/2013   PLT 208.0 07/06/2013   NEUTROABS 4.9 07/06/2013     CMP      Component Value Date/Time   NA 138 07/06/2013 1452   K 4.8 07/06/2013 1452   CL 101 07/06/2013 1452   CO2 29 07/06/2013 1452   GLUCOSE 120* 07/06/2013 1452   BUN 11 07/06/2013 1452   CREATININE 1.2 07/06/2013 1452   CREATININE 1.09 01/21/2011 1042   CALCIUM 9.9 07/06/2013 1452   PROT 8.0 07/06/2013 1452   ALBUMIN 4.7 07/06/2013 1452   AST 15 07/06/2013 1452   ALT 17 07/06/2013 1452   ALKPHOS 76 07/06/2013 1452   BILITOT 0.8 07/06/2013 1452   GFRNONAA 77* 05/17/2013 0400   GFRAA 90* 05/17/2013 0400      Imaging:  CT abdomen and pelvis 07/09/2013 reviewed with Mr. Manger   Assessment/Plan:   1. Pancreas cancer.  07/06/2013 ,CA 19-9- 2067.  CT abdomen/pelvis 07/09/2013 with interval development of amorphous soft tissue circumferentially encasing the celiac axis and tracking caudally down toward the SMA. Associated stable irregular mild distention of the main pancreatic duct in the body and tail of the pancreas with no discrete mass lesion seen. 2.2 x 1.4 cm  portacaval lymph node in the hepatoduodenal ligament had increased in size from 1.3 x 1.1 cm previously. Other scattered small lymph nodes seen in the hepatoduodenal ligament.  Endoscopic ultrasound 07/15/2013 with a very vague masslike lesion in the region of the uncinate pancreas. Fine needle aspiration showed atypical cells consistent with adenocarcinoma. 2. Abdominal pain secondary to #1. 3. Hypertension. 4. History of gout.   Disposition: Mr. Beougher has been diagnosed with pancreas cancer. He understands that surgery is the only potentially curative therapy. He also understands that he likely does not have resectable disease. His case will be presented at the GI tumor conference on 07/21/2013 to confirm if surgery is an option.   [name] discussed initiation of systemic therapy with Mr. Monforte and his wife. We discussed the  FOLFIRINOX regimen as well as gemcitabine/Abraxane. [name] recommends FOLFIRINOX as he appears to otherwise be in very good health.  We discussed potential toxicities associated with chemotherapy in general including myelosuppression, nausea, mouth sores, hair loss, allergic reaction. We discussed the potential for mouth sores, nausea, diarrhea, skin hyperpigmentation and hand-foot syndrome with 5 fluorouracil. We reviewed potential toxicities associated with irinotecan including myelosuppression, nausea, hair loss, diarrhea (both early and late phase which can be severe). We reviewed the neurotoxicity associated with oxaliplatin including cold sensitivity, peripheral neuropathy and acute laryngeal pharyngeal dysesthesia. We also discussed more rare occurrences such as diplopia, jaw pain, ataxia.  He will attend a chemotherapy education class. We made a referral for Port-A-Cath placement. We also referred him for a PET scan to complete the staging evaluation.  He will begin MS Contin 30 mg every 12 hours with continuation of oxycodone as needed for breakthrough pain.  He will return for a followup visit with [name] on 07/30/2013 for additional discussion with tentative plans to proceed with cycle 1 of FOLFIRINOX on 08/02/2013.   Patient seen with [name]. CT images were reviewed on the computer with Mr. Shampine and his wife. 50 minutes were spent face-to-face at today's visit with the majority of the time involved in counseling/coordination of care.   Ned Card, ANP/GNP-BC 07/20/2013, 3:47 PM  This was a shared visit with Ned Card. Mr. Hall Busing appears to have unresectable pancreas cancer. He is symptomatic with pain. We adjusted the narcotic pain regimen today. I recommend systemic chemotherapy. He agrees to proceed. We reviewed the potential toxicities associated with the FOLFIRINOX regimen. He will return for an office visit and further discussion after a staging PET scan. I  will present his case at the GI tumor conference.   Julieanne Manson, M.D.

## 2013-07-20 NOTE — Patient Instructions (Addendum)
Leucovorin injection What is this medicine? LEUCOVORIN (loo koe VOR in) is used to prevent or treat the harmful effects of some medicines. This medicine is used to treat anemia caused by a low amount of folic acid in the body. It is also used with 5-fluorouracil (5-FU) to treat colon cancer. This medicine may be used for other purposes; ask your health care provider or pharmacist if you have questions. What should I tell my health care provider before I take this medicine? They need to know if you have any of these conditions: -anemia from low levels of vitamin B-12 in the blood -an unusual or allergic reaction to leucovorin, folic acid, other medicines, foods, dyes, or preservatives -pregnant or trying to get pregnant -breast-feeding How should I use this medicine? This medicine is for injection into a muscle or into a vein. It is given by a health care professional in a hospital or clinic setting. Talk to your pediatrician regarding the use of this medicine in children. Special care may be needed. Overdosage: If you think you have taken too much of this medicine contact a poison control center or emergency room at once. NOTE: This medicine is only for you. Do not share this medicine with others. What if I miss a dose? This does not apply. What may interact with this medicine? -capecitabine -fluorouracil -phenobarbital -phenytoin -primidone -trimethoprim-sulfamethoxazole This list may not describe all possible interactions. Give your health care provider a list of all the medicines, herbs, non-prescription drugs, or dietary supplements you use. Also tell them if you smoke, drink alcohol, or use illegal drugs. Some items may interact with your medicine. What should I watch for while using this medicine? Your condition will be monitored carefully while you are receiving this medicine. This medicine may increase the side effects of 5-fluorouracil, 5-FU. Tell your doctor or health care  professional if you have diarrhea or mouth sores that do not get better or that get worse. What side effects may I notice from receiving this medicine? Side effects that you should report to your doctor or health care professional as soon as possible: -allergic reactions like skin rash, itching or hives, swelling of the face, lips, or tongue -breathing problems -fever, infection -mouth sores -unusual bleeding or bruising -unusually weak or tired Side effects that usually do not require medical attention (report to your doctor or health care professional if they continue or are bothersome): -constipation or diarrhea -loss of appetite -nausea, vomiting This list may not describe all possible side effects. Call your doctor for medical advice about side effects. You may report side effects to FDA at 1-800-FDA-1088. Where should I keep my medicine? This drug is given in a hospital or clinic and will not be stored at home. NOTE: This sheet is a summary. It may not cover all possible information. If you have questions about this medicine, talk to your doctor, pharmacist, or health care provider.  2014, [employer]. (2007-07-28 16:50:29) Fluorouracil, 5-FU injection What is this medicine? FLUOROURACIL, 5-FU (flure oh YOOR a sil) is a chemotherapy drug. It slows the growth of cancer cells. This medicine is used to treat many types of cancer like breast cancer, colon or rectal cancer, pancreatic cancer, and stomach cancer. This medicine may be used for other purposes; ask your health care provider or pharmacist if you have questions. COMMON BRAND NAME(S): Adrucil What should I tell my health care provider before I take this medicine? They need to know if you have any of these conditions: -  blood disorders -dihydropyrimidine dehydrogenase (DPD) deficiency -infection (especially a virus infection such as chickenpox, cold sores, or herpes) -kidney disease -liver disease -malnourished,  poor nutrition -recent or ongoing radiation therapy -an unusual or allergic reaction to fluorouracil, other chemotherapy, other medicines, foods, dyes, or preservatives -pregnant or trying to get pregnant -breast-feeding How should I use this medicine? This drug is given as an infusion or injection into a vein. It is administered in a hospital or clinic by a specially trained health care professional. Talk to your pediatrician regarding the use of this medicine in children. Special care may be needed. Overdosage: If you think you have taken too much of this medicine contact a poison control center or emergency room at once. NOTE: This medicine is only for you. Do not share this medicine with others. What if I miss a dose? It is important not to miss your dose. Call your doctor or health care professional if you are unable to keep an appointment. What may interact with this medicine? -allopurinol -cimetidine -dapsone -digoxin -hydroxyurea -leucovorin -levamisole -medicines for seizures like ethotoin, fosphenytoin, phenytoin -medicines to increase blood counts like filgrastim, pegfilgrastim, sargramostim -medicines that treat or prevent blood clots like warfarin, enoxaparin, and dalteparin -methotrexate -metronidazole -pyrimethamine -some other chemotherapy drugs like busulfan, cisplatin, estramustine, vinblastine -trimethoprim -trimetrexate -vaccines Talk to your doctor or health care professional before taking any of these medicines: -acetaminophen -aspirin -ibuprofen -ketoprofen -naproxen This list may not describe all possible interactions. Give your health care provider a list of all the medicines, herbs, non-prescription drugs, or dietary supplements you use. Also tell them if you smoke, drink alcohol, or use illegal drugs. Some items may interact with your medicine. What should I watch for while using this medicine? Visit your doctor for checks on your progress. This drug  may make you feel generally unwell. This is not uncommon, as chemotherapy can affect healthy cells as well as cancer cells. Report any side effects. Continue your course of treatment even though you feel ill unless your doctor tells you to stop. In some cases, you may be given additional medicines to help with side effects. Follow all directions for their use. Call your doctor or health care professional for advice if you get a fever, chills or sore throat, or other symptoms of a cold or flu. Do not treat yourself. This drug decreases your body's ability to fight infections. Try to avoid being around people who are sick. This medicine may increase your risk to bruise or bleed. Call your doctor or health care professional if you notice any unusual bleeding. Be careful brushing and flossing your teeth or using a toothpick because you may get an infection or bleed more easily. If you have any dental work done, tell your dentist you are receiving this medicine. Avoid taking products that contain aspirin, acetaminophen, ibuprofen, naproxen, or ketoprofen unless instructed by your doctor. These medicines may hide a fever. Do not become pregnant while taking this medicine. Women should inform their doctor if they wish to become pregnant or think they might be pregnant. There is a potential for serious side effects to an unborn child. Talk to your health care professional or pharmacist for more information. Do not breast-feed an infant while taking this medicine. Men should inform their doctor if they wish to father a child. This medicine may lower sperm counts. Do not treat diarrhea with over the counter products. Contact your doctor if you have diarrhea that lasts more than 2 days or if it  is severe and watery. This medicine can make you more sensitive to the sun. Keep out of the sun. If you cannot avoid being in the sun, wear protective clothing and use sunscreen. Do not use sun lamps or tanning  beds/booths. What side effects may I notice from receiving this medicine? Side effects that you should report to your doctor or health care professional as soon as possible: -allergic reactions like skin rash, itching or hives, swelling of the face, lips, or tongue -low blood counts - this medicine may decrease the number of white blood cells, red blood cells and platelets. You may be at increased risk for infections and bleeding. -signs of infection - fever or chills, cough, sore throat, pain or difficulty passing urine -signs of decreased platelets or bleeding - bruising, pinpoint red spots on the skin, black, tarry stools, blood in the urine -signs of decreased red blood cells - unusually weak or tired, fainting spells, lightheadedness -breathing problems -changes in vision -chest pain -mouth sores -nausea and vomiting -pain, swelling, redness at site where injected -pain, tingling, numbness in the hands or feet -redness, swelling, or sores on hands or feet -stomach pain -unusual bleeding Side effects that usually do not require medical attention (report to your doctor or health care professional if they continue or are bothersome): -changes in finger or toe nails -diarrhea -dry or itchy skin -hair loss -headache -loss of appetite -sensitivity of eyes to the light -stomach upset -unusually teary eyes This list may not describe all possible side effects. Call your doctor for medical advice about side effects. You may report side effects to FDA at 1-800-FDA-1088. Where should I keep my medicine? This drug is given in a hospital or clinic and will not be stored at home. NOTE: This sheet is a summary. It may not cover all possible information. If you have questions about this medicine, talk to your doctor, pharmacist, or health care provider.  2014, [employer]. (2007-05-27 13:53:16) Oxaliplatin Injection What is this medicine? OXALIPLATIN (ox AL i PLA tin) is a  chemotherapy drug. It targets fast dividing cells, like cancer cells, and causes these cells to die. This medicine is used to treat cancers of the colon and rectum, and many other cancers. This medicine may be used for other purposes; ask your health care provider or pharmacist if you have questions. COMMON BRAND NAME(S): Eloxatin What should I tell my health care provider before I take this medicine? They need to know if you have any of these conditions: -kidney disease -an unusual or allergic reaction to oxaliplatin, other chemotherapy, other medicines, foods, dyes, or preservatives -pregnant or trying to get pregnant -breast-feeding How should I use this medicine? This drug is given as an infusion into a vein. It is administered in a hospital or clinic by a specially trained health care professional. Talk to your pediatrician regarding the use of this medicine in children. Special care may be needed. Overdosage: If you think you have taken too much of this medicine contact a poison control center or emergency room at once. NOTE: This medicine is only for you. Do not share this medicine with others. What if I miss a dose? It is important not to miss a dose. Call your doctor or health care professional if you are unable to keep an appointment. What may interact with this medicine? -medicines to increase blood counts like filgrastim, pegfilgrastim, sargramostim -probenecid -some antibiotics like amikacin, gentamicin, neomycin, polymyxin B, streptomycin, tobramycin -zalcitabine Talk to your doctor or  health care professional before taking any of these medicines: -acetaminophen -aspirin -ibuprofen -ketoprofen -naproxen This list may not describe all possible interactions. Give your health care provider a list of all the medicines, herbs, non-prescription drugs, or dietary supplements you use. Also tell them if you smoke, drink alcohol, or use illegal drugs. Some items may interact with your  medicine. What should I watch for while using this medicine? Your condition will be monitored carefully while you are receiving this medicine. You will need important blood work done while you are taking this medicine. This medicine can make you more sensitive to cold. Do not drink cold drinks or use ice. Cover exposed skin before coming in contact with cold temperatures or cold objects. When out in cold weather wear warm clothing and cover your mouth and nose to warm the air that goes into your lungs. Tell your doctor if you get sensitive to the cold. This drug may make you feel generally unwell. This is not uncommon, as chemotherapy can affect healthy cells as well as cancer cells. Report any side effects. Continue your course of treatment even though you feel ill unless your doctor tells you to stop. In some cases, you may be given additional medicines to help with side effects. Follow all directions for their use. Call your doctor or health care professional for advice if you get a fever, chills or sore throat, or other symptoms of a cold or flu. Do not treat yourself. This drug decreases your body's ability to fight infections. Try to avoid being around people who are sick. This medicine may increase your risk to bruise or bleed. Call your doctor or health care professional if you notice any unusual bleeding. Be careful brushing and flossing your teeth or using a toothpick because you may get an infection or bleed more easily. If you have any dental work done, tell your dentist you are receiving this medicine. Avoid taking products that contain aspirin, acetaminophen, ibuprofen, naproxen, or ketoprofen unless instructed by your doctor. These medicines may hide a fever. Do not become pregnant while taking this medicine. Women should inform their doctor if they wish to become pregnant or think they might be pregnant. There is a potential for serious side effects to an unborn child. Talk to your health  care professional or pharmacist for more information. Do not breast-feed an infant while taking this medicine. Call your doctor or health care professional if you get diarrhea. Do not treat yourself. What side effects may I notice from receiving this medicine? Side effects that you should report to your doctor or health care professional as soon as possible: -allergic reactions like skin rash, itching or hives, swelling of the face, lips, or tongue -low blood counts - This drug may decrease the number of white blood cells, red blood cells and platelets. You may be at increased risk for infections and bleeding. -signs of infection - fever or chills, cough, sore throat, pain or difficulty passing urine -signs of decreased platelets or bleeding - bruising, pinpoint red spots on the skin, black, tarry stools, nosebleeds -signs of decreased red blood cells - unusually weak or tired, fainting spells, lightheadedness -breathing problems -chest pain, pressure -cough -diarrhea -jaw tightness -mouth sores -nausea and vomiting -pain, swelling, redness or irritation at the injection site -pain, tingling, numbness in the hands or feet -problems with balance, talking, walking -redness, blistering, peeling or loosening of the skin, including inside the mouth -trouble passing urine or change in the amount of urine  Side effects that usually do not require medical attention (report to your doctor or health care professional if they continue or are bothersome): -changes in vision -constipation -hair loss -loss of appetite -metallic taste in the mouth or changes in taste -stomach pain This list may not describe all possible side effects. Call your doctor for medical advice about side effects. You may report side effects to FDA at 1-800-FDA-1088. Where should I keep my medicine? This drug is given in a hospital or clinic and will not be stored at home. NOTE: This sheet is a summary. It may not cover all  possible information. If you have questions about this medicine, talk to your doctor, pharmacist, or health care provider.  2014, [employer]. (2007-08-18 17:22:47) Irinotecan injection What is this medicine? IRINOTECAN (ir in oh TEE kan ) is a chemotherapy drug. It is used to treat colon and rectal cancer. This medicine may be used for other purposes; ask your health care provider or pharmacist if you have questions. COMMON BRAND NAME(S): Camptosar What should I tell my health care provider before I take this medicine? They need to know if you have any of these conditions: -blood disorders -dehydration -diarrhea -infection (especially a virus infection such as chickenpox, cold sores, or herpes) -liver disease -low blood counts, like low white cell, platelet, or red cell counts -recent or ongoing radiation therapy -an unusual or allergic reaction to irinotecan, sorbitol, other chemotherapy, other medicines, foods, dyes, or preservatives -pregnant or trying to get pregnant -breast-feeding How should I use this medicine? This drug is given as an infusion into a vein. It is administered in a hospital or clinic by a specially trained health care professional. Talk to your pediatrician regarding the use of this medicine in children. Special care may be needed. Overdosage: If you think you have taken too much of this medicine contact a poison control center or emergency room at once. NOTE: This medicine is only for you. Do not share this medicine with others. What if I miss a dose? It is important not to miss your dose. Call your doctor or health care professional if you are unable to keep an appointment. What may interact with this medicine? Do not take this medicine with any of the following medications: -atazanavir -ketoconazole -St. John's Wort This medicine may also interact with the following medications: -dexamethasone -diuretics -laxatives -medicines for seizures like  carbamazepine, mephobarbital, phenobarbital, phenytoin, primidone -medicines to increase blood counts like filgrastim, pegfilgrastim, sargramostim -prochlorperazine -vaccines This list may not describe all possible interactions. Give your health care provider a list of all the medicines, herbs, non-prescription drugs, or dietary supplements you use. Also tell them if you smoke, drink alcohol, or use illegal drugs. Some items may interact with your medicine. What should I watch for while using this medicine? Your condition will be monitored carefully while you are receiving this medicine. You will need important blood work done while you are taking this medicine. This drug may make you feel generally unwell. This is not uncommon, as chemotherapy can affect healthy cells as well as cancer cells. Report any side effects. Continue your course of treatment even though you feel ill unless your doctor tells you to stop. In some cases, you may be given additional medicines to help with side effects. Follow all directions for their use. You may get drowsy or dizzy. Do not drive, use machinery, or do anything that needs mental alertness until you know how this medicine affects you. Do not stand  or sit up quickly, especially if you are an older patient. This reduces the risk of dizzy or fainting spells. Call your doctor or health care professional for advice if you get a fever, chills or sore throat, or other symptoms of a cold or flu. Do not treat yourself. This drug decreases your body's ability to fight infections. Try to avoid being around people who are sick. This medicine may increase your risk to bruise or bleed. Call your doctor or health care professional if you notice any unusual bleeding. Be careful brushing and flossing your teeth or using a toothpick because you may get an infection or bleed more easily. If you have any dental work done, tell your dentist you are receiving this medicine. Avoid taking  products that contain aspirin, acetaminophen, ibuprofen, naproxen, or ketoprofen unless instructed by your doctor. These medicines may hide a fever. Do not become pregnant while taking this medicine. Women should inform their doctor if they wish to become pregnant or think they might be pregnant. There is a potential for serious side effects to an unborn child. Talk to your health care professional or pharmacist for more information. Do not breast-feed an infant while taking this medicine. What side effects may I notice from receiving this medicine? Side effects that you should report to your doctor or health care professional as soon as possible: -allergic reactions like skin rash, itching or hives, swelling of the face, lips, or tongue -low blood counts - this medicine may decrease the number of white blood cells, red blood cells and platelets. You may be at increased risk for infections and bleeding. -signs of infection - fever or chills, cough, sore throat, pain or difficulty passing urine -signs of decreased platelets or bleeding - bruising, pinpoint red spots on the skin, black, tarry stools, blood in the urine -signs of decreased red blood cells - unusually weak or tired, fainting spells, lightheadedness -breathing problems -chest pain -diarrhea -feeling faint or lightheaded, falls -flushing, runny nose, sweating during infusion -mouth sores or pain -pain, swelling, redness or irritation where injected -pain, swelling, warmth in the leg -pain, tingling, numbness in the hands or feet -problems with balance, talking, walking -stomach cramps, pain -trouble passing urine or change in the amount of urine -vomiting as to be unable to hold down drinks or food -yellowing of the eyes or skin Side effects that usually do not require medical attention (report to your doctor or health care professional if they continue or are bothersome): -constipation -hair loss -headache -loss of  appetite -nausea, vomiting -stomach upset This list may not describe all possible side effects. Call your doctor for medical advice about side effects. You may report side effects to FDA at 1-800-FDA-1088. Where should I keep my medicine? This drug is given in a hospital or clinic and will not be stored at home. NOTE: This sheet is a summary. It may not cover all possible information. If you have questions about this medicine, talk to your doctor, pharmacist, or health care provider.  2014, [employer] Standard. (2007-06-09 16:29:12) Implanted [facility] Guide An implanted port is a type of central line that is placed under the skin. Central lines are used to provide IV access when treatment or nutrition needs to be given through a person's veins. Implanted ports are used for long-term IV access. An implanted port may be placed because:   You need IV medicine that would be irritating to the small veins in your hands or arms.   You need  long-term IV medicines, such as antibiotics.   You need IV nutrition for a long period.   You need frequent blood draws for lab tests.   You need dialysis.  Implanted ports are usually placed in the chest area, but they can also be placed in the upper arm, the abdomen, or the leg. An implanted port has two main parts:   Reservoir. The reservoir is round and will appear as a small, raised area under your skin. The reservoir is the part where a needle is inserted to give medicines or draw blood.   Catheter. The catheter is a thin, flexible tube that extends from the reservoir. The catheter is placed into a large vein. Medicine that is inserted into the reservoir goes into the catheter and then into the vein.  HOW WILL I CARE FOR MY INCISION SITE? Do not get the incision site wet. Bathe or shower as directed by your health care provider.  HOW IS MY PORT ACCESSED? Special steps must be taken to access the port:   Before the port is accessed, a  numbing cream can be placed on the skin. This helps numb the skin over the port site.   Your health care provider uses a sterile technique to access the port.  Your health care provider must put on a mask and sterile gloves.  The skin over your port is cleaned carefully with an antiseptic and allowed to dry.  The port is gently pinched between sterile gloves, and a needle is inserted into the port.  Only "non-coring" port needles should be used to access the port. Once the port is accessed, a blood return should be checked. This helps ensure that the port is in the vein and is not clogged.   If your port needs to remain accessed for a constant infusion, a clear (transparent) bandage will be placed over the needle site. The bandage and needle will need to be changed every week, or as directed by your health care provider.   Keep the bandage covering the needle clean and dry. Do not get it wet. Follow your health care provider's instructions on how to take a shower or bath while the port is accessed.   If your port does not need to stay accessed, no bandage is needed over the port.  WHAT IS FLUSHING? Flushing helps keep the port from getting clogged. Follow your health care provider's instructions on how and when to flush the port. Ports are usually flushed with saline solution or a medicine called heparin. The need for flushing will depend on how the port is used.   If the port is used for intermittent medicines or blood draws, the port will need to be flushed:   After medicines have been given.   After blood has been drawn.   As part of routine maintenance.   If a constant infusion is running, the port may not need to be flushed.  HOW LONG WILL MY PORT STAY IMPLANTED? The port can stay in for as long as your health care provider thinks it is needed. When it is time for the port to come out, surgery will be done to remove it. The procedure is similar to the one performed when  the port was put in.  WHEN SHOULD I SEEK IMMEDIATE MEDICAL CARE? When you have an implanted port, you should seek immediate medical care if:   You notice a bad smell coming from the incision site.   You have swelling, redness, or  drainage at the incision site.   You have more swelling or pain at the port site or the surrounding area.   You have a fever that is not controlled with medicine. Document Released: 01/21/2005 Document Revised: 11/11/2012 Document Reviewed: 09/28/2012 [facility] Patient Information 2014 [place].

## 2013-07-21 ENCOUNTER — Other Ambulatory Visit: Payer: Self-pay | Admitting: Radiology

## 2013-07-21 ENCOUNTER — Telehealth: Payer: Self-pay | Admitting: Oncology

## 2013-07-21 ENCOUNTER — Telehealth: Payer: Self-pay | Admitting: *Deleted

## 2013-07-21 ENCOUNTER — Ambulatory Visit

## 2013-07-21 ENCOUNTER — Encounter (HOSPITAL_COMMUNITY): Payer: Self-pay | Admitting: Pharmacy Technician

## 2013-07-21 ENCOUNTER — Ambulatory Visit: Admitting: Oncology

## 2013-07-21 NOTE — Telephone Encounter (Signed)
Per staff message and POF I have scheduled appts. Advised scheduler of appts. JMW  

## 2013-07-21 NOTE — Telephone Encounter (Signed)
s.w pt and advised on 6.29 appt....pt ok and aware

## 2013-07-21 NOTE — Telephone Encounter (Signed)
Received message from [place], [facility] needing to hold one dose of Xarelto for port placement.  Please give her a call at ext [phone] and place a note in EPIC when completed.  Xarelto was ordered by Dr. Adrian Blackwater on 06/08/2013.  Derl Barrow, RN

## 2013-07-21 NOTE — Telephone Encounter (Signed)
Discussed case with Tiffany from [facility].  Will hold Xarelto 1 day prior to [facility] placement and resume day of if no complications.  Appreciate guidance and will be available for any questions.  Thanks, Tamela Oddi. Awanda Mink, DO of [facility] 07/21/2013, 12:17 PM

## 2013-07-22 ENCOUNTER — Other Ambulatory Visit: Payer: Self-pay | Admitting: *Deleted

## 2013-07-22 ENCOUNTER — Telehealth: Payer: Self-pay | Admitting: Family Medicine

## 2013-07-22 ENCOUNTER — Other Ambulatory Visit

## 2013-07-22 ENCOUNTER — Telehealth: Payer: Self-pay | Admitting: *Deleted

## 2013-07-22 ENCOUNTER — Encounter: Payer: Self-pay | Admitting: Oncology

## 2013-07-22 DIAGNOSIS — C189 Malignant neoplasm of colon, unspecified: Secondary | ICD-10-CM

## 2013-07-22 MED ORDER — LIDOCAINE-PRILOCAINE 2.5-2.5 % EX CREA: 1.0000 "application " | TOPICAL_CREAM | CUTANEOUS | Status: DC | PRN

## 2013-07-22 NOTE — Telephone Encounter (Signed)
[facility] requesting a referral for [employer] ins for patient's visit with them.

## 2013-07-22 NOTE — Progress Notes (Signed)
Called pt's PCP and requested they put in a referral for pt's chemotherapy.  Per Ray [name]. a referral is not required but they will pay more if a referral is done.

## 2013-07-22 NOTE — Telephone Encounter (Signed)
Patient reports he was not allowed to fill the oxycodone/apap script that was written on 07/20/13 because it was too early according to directions given by Dr. Ardis Hughs on the script written on 07/15/13 that he picked up on 07/16/13. Had to take more than what was ordered due to pain. Has #4 left of these. Was started on MS Contin at visit this week. Pharmacy needs OK that they can fill his oxycodone/apap today instead of making him wait till 6/20? Called pharmacy and confirmed all this with the pharmacist. Called back and made pharmacy aware it is OK to fill his oxycodone/apap now. Patient also notified.

## 2013-07-23 ENCOUNTER — Other Ambulatory Visit: Payer: Self-pay | Admitting: Radiology

## 2013-07-26 ENCOUNTER — Encounter (HOSPITAL_COMMUNITY): Payer: Self-pay

## 2013-07-26 ENCOUNTER — Other Ambulatory Visit: Payer: Self-pay | Admitting: Nurse Practitioner

## 2013-07-26 ENCOUNTER — Ambulatory Visit (HOSPITAL_COMMUNITY)
Admission: RE | Admit: 2013-07-26 | Discharge: 2013-07-26 | Disposition: A | Discharge status: Home / Self Care | Source: Ambulatory Visit | Attending: Nurse Practitioner | Admitting: Nurse Practitioner

## 2013-07-26 VITALS — BP 128/83 | HR 70 | Temp 98.4°F | Resp 18

## 2013-07-26 DIAGNOSIS — M109 Gout, unspecified: Secondary | ICD-10-CM | POA: Insufficient documentation

## 2013-07-26 DIAGNOSIS — C259 Malignant neoplasm of pancreas, unspecified: Secondary | ICD-10-CM | POA: Insufficient documentation

## 2013-07-26 DIAGNOSIS — K219 Gastro-esophageal reflux disease without esophagitis: Secondary | ICD-10-CM | POA: Insufficient documentation

## 2013-07-26 DIAGNOSIS — E785 Hyperlipidemia, unspecified: Secondary | ICD-10-CM | POA: Insufficient documentation

## 2013-07-26 DIAGNOSIS — Z981 Arthrodesis status: Secondary | ICD-10-CM | POA: Insufficient documentation

## 2013-07-26 DIAGNOSIS — I10 Essential (primary) hypertension: Secondary | ICD-10-CM | POA: Insufficient documentation

## 2013-07-26 DIAGNOSIS — F17200 Nicotine dependence, unspecified, uncomplicated: Secondary | ICD-10-CM | POA: Insufficient documentation

## 2013-07-26 LAB — CBC
HCT: 36.5 % — ABNORMAL LOW (ref 39.0–52.0)
HEMOGLOBIN: 12.5 g/dL — AB (ref 13.0–17.0)
MCH: 28 pg (ref 26.0–34.0)
MCHC: 34.2 g/dL (ref 30.0–36.0)
MCV: 81.8 fL (ref 78.0–100.0)
Platelets: 223 10*3/uL (ref 150–400)
RBC: 4.46 MIL/uL (ref 4.22–5.81)
RDW: 14.7 % (ref 11.5–15.5)
WBC: 5.7 10*3/uL (ref 4.0–10.5)

## 2013-07-26 LAB — PROTIME-INR
INR: 1.12 (ref 0.00–1.49)
Prothrombin Time: 14.2 seconds (ref 11.6–15.2)

## 2013-07-26 LAB — APTT: aPTT: 28 seconds (ref 24–37)

## 2013-07-26 MED ORDER — HEPARIN SOD (PORK) LOCK FLUSH 100 UNIT/ML IV SOLN
INTRAVENOUS | Status: DC | PRN
  Administered 2013-07-26: 500 [IU]

## 2013-07-26 MED ORDER — FENTANYL CITRATE 0.05 MG/ML IJ SOLN
INTRAMUSCULAR | Status: AC | PRN
  Administered 2013-07-26: 50 ug via INTRAVENOUS

## 2013-07-26 MED ORDER — SODIUM CHLORIDE 0.9 % IV SOLN
INTRAVENOUS | Status: DC
  Administered 2013-07-26: 10:00:00 via INTRAVENOUS

## 2013-07-26 MED ORDER — LIDOCAINE-EPINEPHRINE (PF) 2 %-1:200000 IJ SOLN
INTRAMUSCULAR | Status: AC
  Filled 2013-07-26: qty 20

## 2013-07-26 MED ORDER — LIDOCAINE HCL 1 % IJ SOLN
INTRAMUSCULAR | Status: AC
  Filled 2013-07-26: qty 20

## 2013-07-26 MED ORDER — MIDAZOLAM HCL 2 MG/2ML IJ SOLN
INTRAMUSCULAR | Status: AC
  Filled 2013-07-26: qty 6

## 2013-07-26 MED ORDER — HEPARIN SOD (PORK) LOCK FLUSH 100 UNIT/ML IV SOLN
INTRAVENOUS | Status: AC
  Filled 2013-07-26: qty 5

## 2013-07-26 MED ORDER — MIDAZOLAM HCL 2 MG/2ML IJ SOLN
INTRAMUSCULAR | Status: AC | PRN
  Administered 2013-07-26: 1 mg via INTRAVENOUS
  Administered 2013-07-26 (×2): 0.5 mg via INTRAVENOUS
  Administered 2013-07-26: 1 mg via INTRAVENOUS
  Administered 2013-07-26 (×2): 0.5 mg via INTRAVENOUS

## 2013-07-26 MED ORDER — CEFAZOLIN SODIUM-DEXTROSE 2-3 GM-% IV SOLR
INTRAVENOUS | Status: AC
  Filled 2013-07-26: qty 50

## 2013-07-26 MED ORDER — FENTANYL CITRATE 0.05 MG/ML IJ SOLN
INTRAMUSCULAR | Status: AC
  Filled 2013-07-26: qty 6

## 2013-07-26 MED ORDER — CEFAZOLIN SODIUM-DEXTROSE 2-3 GM-% IV SOLR
2.0000 g | INTRAVENOUS | Status: AC
  Administered 2013-07-26: 2 g via INTRAVENOUS
  Filled 2013-07-26: qty 50

## 2013-07-26 NOTE — Discharge Instructions (Signed)
Conscious Sedation °Sedation is the use of medicines to promote relaxation and relieve discomfort and anxiety. Conscious sedation is a type of sedation. Under conscious sedation you are less alert than normal but are still able to respond to instructions or stimulation. Conscious sedation is used during short medical and dental procedures. It is milder than deep sedation or general anesthesia and allows you to return to your regular activities sooner.  °LET YOUR HEALTH CARE PROVIDER KNOW ABOUT:  °· Any allergies you have. °· All medicines you are taking, including vitamins, herbs, eye drops, creams, and over-the-counter medicines. °· Use of steroids (by mouth or creams). °· Previous problems you or members of your family have had with the use of anesthetics. °· Any blood disorders you have. °· Previous surgeries you have had. °· Medical conditions you have. °· Possibility of pregnancy, if this applies. °· Use of cigarettes, alcohol, or illegal drugs. °RISKS AND COMPLICATIONS °Generally, this is a safe procedure. However, as with any procedure, problems can occur. Possible problems include: °· Oversedation. °· Trouble breathing on your own. You may need to have a breathing tube until you are awake and breathing on your own. °· Allergic reaction to any of the medicines used for the procedure. °BEFORE THE PROCEDURE °· You may have blood tests done. These tests can help show how well your kidneys and liver are working. They can also show how well your blood clots. °· A physical exam will be done.   °· Only take medicines as directed by your health care provider. You may need to stop taking medicines (such as blood thinners, aspirin, or nonsteroidal anti-inflammatory drugs) before the procedure.   °· Do not eat or drink at least 6 hours before the procedure or as directed by your health care provider. °· Arrange for a responsible adult, family member, or friend to take you home after the procedure. He or she should stay  with you for at least 24 hours after the procedure, until the medicine has worn off. °PROCEDURE  °· An intravenous (IV) catheter will be inserted into one of your veins. Medicine will be able to flow directly into your body through this catheter. You may be given medicine through this tube to help prevent pain and help you relax. °· The medical or dental procedure will be done. °AFTER THE PROCEDURE °· You will stay in a recovery area until the medicine has worn off. Your blood pressure and pulse will be checked.   °·  Depending on the procedure you had, you may be allowed to go home when you can tolerate liquids and your pain is under control. °Document Released: 10/16/2000 Document Revised: 01/26/2013 Document Reviewed: 09/28/2012 °ExitCare® Patient Information ©2015 [employer]. This information is not intended to replace advice given to you by your health care provider. Make sure you discuss any questions you have with your health care provider. °Implanted Port Home Guide °An implanted port is a type of central line that is placed under the skin. Central lines are used to provide IV access when treatment or nutrition needs to be given through a person's veins. Implanted ports are used for long-term IV access. An implanted port may be placed because:  °· You need IV medicine that would be irritating to the small veins in your hands or arms.   °· You need long-term IV medicines, such as antibiotics.   °· You need IV nutrition for a long period.   °· You need frequent blood draws for lab tests.   °· You need dialysis.   °  Implanted ports are usually placed in the chest area, but they can also be placed in the upper arm, the abdomen, or the leg. An implanted port has two main parts:  °· Reservoir. The reservoir is round and will appear as a small, raised area under your skin. The reservoir is the part where a needle is inserted to give medicines or draw blood.   °· Catheter. The catheter is a thin, flexible tube  that extends from the reservoir. The catheter is placed into a large vein. Medicine that is inserted into the reservoir goes into the catheter and then into the vein.   °HOW WILL I CARE FOR MY INCISION SITE? °Do not get the incision site wet. Bathe or shower as directed by your health care provider.  °HOW IS MY PORT ACCESSED? °Special steps must be taken to access the port:  °· Before the port is accessed, a numbing cream can be placed on the skin. This helps numb the skin over the port site.   °· Your health care provider uses a sterile technique to access the port. °¨ Your health care provider must put on a mask and sterile gloves. °¨ The skin over your port is cleaned carefully with an antiseptic and allowed to dry. °¨ The port is gently pinched between sterile gloves, and a needle is inserted into the port. °· Only "non-coring" port needles should be used to access the port. Once the port is accessed, a blood return should be checked. This helps ensure that the port is in the vein and is not clogged.   °· If your port needs to remain accessed for a constant infusion, a clear (transparent) bandage will be placed over the needle site. The bandage and needle will need to be changed every week, or as directed by your health care provider.   °· Keep the bandage covering the needle clean and dry. Do not get it wet. Follow your health care provider's instructions on how to take a shower or bath while the port is accessed.   °· If your port does not need to stay accessed, no bandage is needed over the port.   °WHAT IS FLUSHING? °Flushing helps keep the port from getting clogged. Follow your health care provider's instructions on how and when to flush the port. Ports are usually flushed with saline solution or a medicine called heparin. The need for flushing will depend on how the port is used.  °· If the port is used for intermittent medicines or blood draws, the port will need to be flushed:   °¨ After medicines have  been given.   °¨ After blood has been drawn.   °¨ As part of routine maintenance.   °· If a constant infusion is running, the port may not need to be flushed.   °HOW LONG WILL MY PORT STAY IMPLANTED? °The port can stay in for as long as your health care provider thinks it is needed. When it is time for the port to come out, surgery will be done to remove it. The procedure is similar to the one performed when the port was put in.  °WHEN SHOULD I SEEK IMMEDIATE MEDICAL CARE? °When you have an implanted port, you should seek immediate medical care if:  °· You notice a bad smell coming from the incision site.   °· You have swelling, redness, or drainage at the incision site.   °· You have more swelling or pain at the port site or the surrounding area.   °· You have a fever that is not controlled with   medicine. °Document Released: 01/21/2005 Document Revised: 11/11/2012 Document Reviewed: 09/28/2012 °ExitCare® Patient Information ©2015 [employer]. This information is not intended to replace advice given to you by your health care provider. Make sure you discuss any questions you have with your health care provider. ° °

## 2013-07-26 NOTE — Telephone Encounter (Signed)
Spoke with Lenise, a referral is not needed but according to them if we do the referral Mr [employer] will cover more of his bill so he will have to pay less out of pocket. The referral will have to be called into his insurance.Busick, Kevin Fenton

## 2013-07-26 NOTE — H&P (Signed)
Ray [name] is an 58 y.o. male.   Chief Complaint: Pt with recent wt loss; smoker Abdominal pain Evaluation with CT and MRI reveals Pancreatic abnormality. 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  HPI: GERD; HTN; HLD; pancreatitis and pancreatic Ca  Past Medical History  Diagnosis Date  . Blood transfusion   . GERD (gastroesophageal reflux disease)   . Hyperlipidemia   . Hypertension   . H/O hiatal hernia   . Arthritis     in his back, hips, knees, R shoulder   . Colon polyps     TUBULAR ADENOMA  . Splenic vein thrombosis   . Liver cyst   . Hx of pancreatitis   . Gout     Past Surgical History  Procedure Laterality Date  . Hip surgery      x2 right hip- retained hardware ambulates with cane  . Shoulder surgery      right  . Hand surgery      left thumb tendon repair  . Femur hardware removal  2011    R femur- prothesis removed & replaced  . Posterior cervical fusion/foraminotomy N/A 04/20/2012    Procedure: POSTERIOR CERVICAL FUSION/FORAMINOTOMY LEVEL 1;  Surgeon: Ophelia Charter, MD;  Location: [facility];  Service: Neurosurgery;  Laterality: N/A;  C1/2 laminectomies with posterior screw fixation  . Colonoscopy w/ biopsies  04/15/2011  . Eus N/A 06/10/2013    Procedure: UPPER ENDOSCOPIC ULTRASOUND (EUS) LINEAR;  Surgeon: Milus Banister, MD;  Location: [facility];  Service: Endoscopy;  Laterality: N/A;  . Eus Bilateral 07/15/2013    Procedure: ESOPHAGEAL ENDOSCOPIC ULTRASOUND (EUS) RADIAL;  Surgeon: Milus Banister, MD;  Location: [facility] ENDOSCOPY;  Service: Endoscopy;  Laterality: Bilateral;    Family History  Problem Relation Age of Onset  . Colon cancer Neg Hx   . Esophageal cancer Neg Hx   . Stomach cancer Neg Hx   . Rectal cancer Neg Hx    Social History:  reports that he has been smoking Cigars.  He has never used smokeless tobacco. He reports that he does not  drink alcohol or use illicit drugs.  Allergies: No Known Allergies   (Not in a hospital admission)  Results for orders placed during the hospital encounter of 07/26/13 (from the past 48 hour(s))  APTT     Status: None   Collection Time    07/26/13 10:05 AM      Result Value Ref Range   aPTT 28  24 - 37 seconds  CBC     Status: Abnormal   Collection Time    07/26/13 10:05 AM      Result Value Ref Range   WBC 5.7  4.0 - 10.5 K/uL   RBC 4.46  4.22 - 5.81 MIL/uL   Hemoglobin 12.5 (*) 13.0 - 17.0 g/dL   HCT 36.5 (*) 39.0 - 52.0 %   MCV 81.8  78.0 - 100.0 fL   MCH 28.0  26.0 - 34.0 pg   MCHC 34.2  30.0 - 36.0 g/dL   RDW 14.7  11.5 - 15.5 %   Platelets 223  150 - 400 K/uL  PROTIME-INR     Status: None   Collection Time    07/26/13 10:05 AM      Result Value Ref Range   Prothrombin Time 14.2  11.6 - 15.2 seconds   INR 1.12  0.00 - 1.49   No  results found.  Review of Systems  Constitutional: Positive for weight loss and malaise/fatigue. Negative for fever.  Respiratory: Negative for shortness of breath.   Cardiovascular: Negative for chest pain.  Gastrointestinal: Positive for nausea and abdominal pain.  Neurological: Negative for dizziness and headaches.  Psychiatric/Behavioral: Positive for substance abuse.       Smoker     Blood pressure 131/87, pulse 76, temperature 98.9 F (37.2 C), temperature source Oral, resp. rate 16, SpO2 99.00%. Physical Exam  Constitutional: He is oriented to person, place, and time. He appears well-nourished.  Cardiovascular: Normal rate and regular rhythm.   No murmur heard. Respiratory: Effort normal. He has no wheezes.  GI: Soft. Bowel sounds are normal. There is no tenderness.  Musculoskeletal: Normal range of motion.  Neurological: He is alert and oriented to person, place, and time.  Skin: Skin is warm and dry.  Psychiatric: He has a normal mood and affect. His behavior is normal. Judgment and thought content normal.      Assessment/Plan Panc Ca To start chemo Mon Scheduled now for [facility] placement Pt aware of procedure benefits and risks and agreeable to proceed Consent signed and in chart  Ray [name] A 07/26/2013, 10:46 AM

## 2013-07-26 NOTE — Telephone Encounter (Signed)
Left message for Ray [name] with the cancer center to return call. Please authorize visit Ray [name] had with them so his insurance will cover it.Busick, Ray [name]

## 2013-07-26 NOTE — Procedures (Signed)
Successful RT IJ POWER PORT TIP SVC/RA NO COMP STABLE READY FOR USE  

## 2013-07-29 ENCOUNTER — Encounter (HOSPITAL_COMMUNITY)
Admission: RE | Admit: 2013-07-29 | Discharge: 2013-07-29 | Disposition: A | Discharge status: Home / Self Care | Source: Ambulatory Visit | Attending: Nurse Practitioner | Admitting: Nurse Practitioner

## 2013-07-29 ENCOUNTER — Other Ambulatory Visit: Payer: Self-pay | Admitting: Family Medicine

## 2013-07-29 ENCOUNTER — Other Ambulatory Visit: Payer: Self-pay | Admitting: *Deleted

## 2013-07-29 ENCOUNTER — Telehealth: Payer: Self-pay | Admitting: *Deleted

## 2013-07-29 DIAGNOSIS — C259 Malignant neoplasm of pancreas, unspecified: Secondary | ICD-10-CM

## 2013-07-29 LAB — GLUCOSE, CAPILLARY: Glucose-Capillary: 119 mg/dL — ABNORMAL HIGH (ref 70–99)

## 2013-07-29 MED ORDER — FLUDEOXYGLUCOSE F - 18 (FDG) INJECTION
9.3000 | Freq: Once | INTRAVENOUS | Status: AC | PRN
  Administered 2013-07-29: 9.3 via INTRAVENOUS

## 2013-07-29 MED ORDER — PROCHLORPERAZINE MALEATE 10 MG PO TABS: 10.0000 mg | ORAL_TABLET | Freq: Four times a day (QID) | ORAL | Status: DC | PRN

## 2013-07-29 NOTE — Telephone Encounter (Signed)
Patient walked in after NM procedure.  "Abdominal and back pain (severe)" written on form.  Reports to this nurse pain is "an eleven on pain scale.  Hurts to mid abdomen and moves around to back.  It's constant and unbearable. I take the morphine every 12 hours and one percocet every  "  Reports nausea and vomited this morning before scan   [name] notified.  Verbal orders received and read back for Ray [name] to take two (percocet) oxycodone/APAP 10mg /325 mg every four hours.   Continue (MS Contin) Morphine 30 mg every 12 hours.  Let us know if this does not help. Compazine 10 mg every 6 hours prn n/v [name] has talked with Dr. Ardis Hughs about your pain.

## 2013-07-30 ENCOUNTER — Telehealth: Payer: Self-pay | Admitting: Internal Medicine

## 2013-07-30 ENCOUNTER — Ambulatory Visit (HOSPITAL_BASED_OUTPATIENT_CLINIC_OR_DEPARTMENT_OTHER): Admitting: Oncology

## 2013-07-30 ENCOUNTER — Telehealth: Payer: Self-pay | Admitting: Oncology

## 2013-07-30 ENCOUNTER — Other Ambulatory Visit: Payer: Self-pay | Admitting: *Deleted

## 2013-07-30 ENCOUNTER — Other Ambulatory Visit (HOSPITAL_BASED_OUTPATIENT_CLINIC_OR_DEPARTMENT_OTHER)

## 2013-07-30 VITALS — BP 118/79 | HR 118 | Temp 99.0°F | Resp 19 | Ht 70.0 in | Wt 172.6 lb

## 2013-07-30 DIAGNOSIS — C259 Malignant neoplasm of pancreas, unspecified: Secondary | ICD-10-CM

## 2013-07-30 DIAGNOSIS — I10 Essential (primary) hypertension: Secondary | ICD-10-CM

## 2013-07-30 DIAGNOSIS — G893 Neoplasm related pain (acute) (chronic): Secondary | ICD-10-CM

## 2013-07-30 DIAGNOSIS — C250 Malignant neoplasm of head of pancreas: Secondary | ICD-10-CM | POA: Insufficient documentation

## 2013-07-30 DIAGNOSIS — C189 Malignant neoplasm of colon, unspecified: Secondary | ICD-10-CM

## 2013-07-30 DIAGNOSIS — K5900 Constipation, unspecified: Secondary | ICD-10-CM

## 2013-07-30 LAB — CANCER ANTIGEN 19-9: CA 19 9: 6124.5 U/mL — AB (ref <35.0)

## 2013-07-30 LAB — COMPREHENSIVE METABOLIC PANEL (CC13)
ALBUMIN: 4.4 g/dL (ref 3.5–5.0)
ALT: 23 U/L (ref 0–55)
AST: 15 U/L (ref 5–34)
Alkaline Phosphatase: 100 U/L (ref 40–150)
Anion Gap: 14 mEq/L — ABNORMAL HIGH (ref 3–11)
BUN: 11.6 mg/dL (ref 7.0–26.0)
CO2: 26 meq/L (ref 22–29)
Calcium: 10 mg/dL (ref 8.4–10.4)
Chloride: 97 mEq/L — ABNORMAL LOW (ref 98–109)
Creatinine: 1.1 mg/dL (ref 0.7–1.3)
GLUCOSE: 124 mg/dL (ref 70–140)
Potassium: 4.4 mEq/L (ref 3.5–5.1)
SODIUM: 137 meq/L (ref 136–145)
TOTAL PROTEIN: 7.9 g/dL (ref 6.4–8.3)
Total Bilirubin: 0.97 mg/dL (ref 0.20–1.20)

## 2013-07-30 LAB — CBC WITH DIFFERENTIAL/PLATELET
BASO%: 0.3 % (ref 0.0–2.0)
BASOS ABS: 0 10*3/uL (ref 0.0–0.1)
EOS ABS: 0 10*3/uL (ref 0.0–0.5)
EOS%: 0.2 % (ref 0.0–7.0)
HEMATOCRIT: 43.5 % (ref 38.4–49.9)
HGB: 14 g/dL (ref 13.0–17.1)
LYMPH%: 24.7 % (ref 14.0–49.0)
MCH: 27.7 pg (ref 27.2–33.4)
MCHC: 32.2 g/dL (ref 32.0–36.0)
MCV: 86 fL (ref 79.3–98.0)
MONO#: 0.5 10*3/uL (ref 0.1–0.9)
MONO%: 8.1 % (ref 0.0–14.0)
NEUT%: 66.7 % (ref 39.0–75.0)
NEUTROS ABS: 4.5 10*3/uL (ref 1.5–6.5)
PLATELETS: 233 10*3/uL (ref 140–400)
RBC: 5.06 10*6/uL (ref 4.20–5.82)
RDW: 14.7 % — ABNORMAL HIGH (ref 11.0–14.6)
WBC: 6.7 10*3/uL (ref 4.0–10.3)
lymph#: 1.7 10*3/uL (ref 0.9–3.3)

## 2013-07-30 MED ORDER — LIDOCAINE-PRILOCAINE 2.5-2.5 % EX CREA: TOPICAL_CREAM | CUTANEOUS | Status: DC

## 2013-07-30 MED ORDER — OXYCODONE-ACETAMINOPHEN 10-325 MG PO TABS: ORAL_TABLET | ORAL | Status: DC

## 2013-07-30 MED ORDER — SORBITOL 70 % SOLN: Status: DC

## 2013-07-30 NOTE — Progress Notes (Signed)
[facility] OFFICE PROGRESS NOTE   Diagnosis: Pancreas cancer  INTERVAL HISTORY:   [name] returns as scheduled. He has attended a chemotherapy teaching class. He underwent placement of a Port-A-Cath 07/26/2013. He continues to have abdomen and back pain. He is taking MS Contin. Oxycodone helps the pain. He complains of constipation.  Objective:  Vital signs in last 24 hours:  Blood pressure 118/79, pulse 118, temperature 99 F (37.2 C), temperature source Oral, resp. rate 19, height 5\' 10"  (1.778 m), weight 172 lb 9.6 oz (78.291 kg), SpO2 100.00%. repeat manual pulse 88    Resp: Lungs clear bilaterally Cardio: Regular rate and rhythm GI: No hepatosplenomegaly, no mass. Tender in the and left upper abdomen Vascular: No leg edema  Portacath/PICC-without erythema  Lab Results:  Lab Results  Component Value Date   WBC 6.7 07/30/2013   HGB 14.0 07/30/2013   HCT 43.5 07/30/2013   MCV 86.0 07/30/2013   PLT 233 07/30/2013   NEUTROABS 4.5 07/30/2013      Imaging:  Nm Pet Image Initial (pi) Skull Base To Thigh  07/29/2013   CLINICAL DATA:  Initial treatment strategy for pancreatic cancer.  EXAM: NUCLEAR MEDICINE PET SKULL BASE TO THIGH  TECHNIQUE: 9.3 mCi F-18 FDG was injected intravenously. Full-ring PET imaging was performed from the skull base to thigh after the radiotracer. CT data was obtained and used for attenuation correction and anatomic localization.  FASTING BLOOD GLUCOSE:  Value: 119 mg/dl  COMPARISON:  CT scan 07/09/2013 and MRI 06/16/2013  FINDINGS: NECK  No hypermetabolic lymph nodes in the neck.  CHEST  No hypermetabolic mediastinal or hilar nodes. No suspicious pulmonary nodules on the CT scan.  ABDOMEN/PELVIS  The amorphous ill-defined soft tissue density just posterior to the pancreas and surrounding the celiac axis vessels demonstrates hypermetabolism with SUV max of 4.2. The adjacent lymph node located just anterior to the IVC has an SUV max of 2.87. I  do not see a discrete pancreatic lesion demonstrating hypermetabolism.  No findings for metastatic disease involving the liver.  SKELETON  No focal hypermetabolic activity to suggest skeletal metastasis.  IMPRESSION: 1. Hypermetabolic mass just posterior to the pancreas and surrounding the celiac axis vessels consistent with neoplasm. There is also what adjacent hypermetabolic lymph node. 2. No definite pancreatic parenchymal lesion or hypermetabolism. 3. No findings for metastatic disease involving the liver.   Electronically Signed   By: Kalman Jewels M.D.   On: 07/29/2013 10:19    Medications: I have reviewed the patient's current medications.  Assessment/Plan: 1. Pancreas cancer. 07/06/2013 ,CA 19-9- 2067.  CT abdomen/pelvis 07/09/2013 with interval development of amorphous soft tissue circumferentially encasing the celiac axis and tracking caudally down toward the SMA. Associated stable irregular mild distention of the main pancreatic duct in the body and tail of the pancreas with no discrete mass lesion seen. 2.2 x 1.4 cm portacaval lymph node in the hepatoduodenal ligament had increased in size from 1.3 x 1.1 cm previously. Other scattered small lymph nodes seen in the hepatoduodenal ligament.  Endoscopic ultrasound 07/15/2013 with a very vague masslike lesion in the region of the uncinate pancreas. Fine needle aspiration showed atypical cells consistent with adenocarcinoma. PET scan 07/29/2013 with hypermetabolic soft tissue posterior to the pancreas with an adjacent hypermetabolic lymph node, no hypermetabolic pancreas mass, no evidence of distant metastatic disease 2. Abdominal pain secondary to #1. 3. Hypertension. 4. History of gout.   Disposition:  Ray [name] appears stable. I discussed the PET scan  findings with him. The plan is to begin FOLFIRINOX on 08/02/2013. We again reviewed the potential toxicities associated with this regimen. We discussed the diarrhea associated with  irinotecan and oxaliplatin-related neuropathy. He agrees to proceed.  He will begin docusate and sorbitol for the constipation. He will let us know if this does not help.  The pain is currently under better control with MS Contin and oxycodone. We will refer him to Dr. Ardis Hughs for a celiac block if the pain does not improve with the FOLFIRINOX.  He will return for an office visit and cycle 2 FOLFIRINOX 08/16/2013.  Betsy Coder, MD  07/30/2013  12:43 PM

## 2013-07-30 NOTE — Telephone Encounter (Signed)
gv adn printed appt sched and avs for pt for June adn July...sed added tx. °

## 2013-08-01 ENCOUNTER — Other Ambulatory Visit: Payer: Self-pay | Admitting: Oncology

## 2013-08-02 ENCOUNTER — Telehealth: Payer: Self-pay | Admitting: Gastroenterology

## 2013-08-02 ENCOUNTER — Ambulatory Visit (HOSPITAL_BASED_OUTPATIENT_CLINIC_OR_DEPARTMENT_OTHER)

## 2013-08-02 VITALS — BP 117/74 | HR 77 | Temp 98.7°F | Resp 18

## 2013-08-02 DIAGNOSIS — C259 Malignant neoplasm of pancreas, unspecified: Secondary | ICD-10-CM

## 2013-08-02 DIAGNOSIS — Z5111 Encounter for antineoplastic chemotherapy: Secondary | ICD-10-CM

## 2013-08-02 MED ORDER — PROCHLORPERAZINE EDISYLATE 5 MG/ML IJ SOLN
10.0000 mg | Freq: Once | INTRAMUSCULAR | Status: AC
  Administered 2013-08-02: 10 mg via INTRAVENOUS

## 2013-08-02 MED ORDER — ONDANSETRON 16 MG/50ML IVPB (CHCC)
INTRAVENOUS | Status: AC
  Filled 2013-08-02: qty 16

## 2013-08-02 MED ORDER — DEXTROSE 5 % IV SOLN
183.0000 mg/m2 | Freq: Once | INTRAVENOUS | Status: AC
  Administered 2013-08-02: 360 mg via INTRAVENOUS
  Filled 2013-08-02: qty 18

## 2013-08-02 MED ORDER — DEXAMETHASONE SODIUM PHOSPHATE 20 MG/5ML IJ SOLN
20.0000 mg | Freq: Once | INTRAMUSCULAR | Status: AC
  Administered 2013-08-02: 20 mg via INTRAVENOUS

## 2013-08-02 MED ORDER — SODIUM CHLORIDE 0.9 % IV SOLN
2400.0000 mg/m2 | INTRAVENOUS | Status: DC
  Administered 2013-08-02: 4750 mg via INTRAVENOUS
  Filled 2013-08-02: qty 95

## 2013-08-02 MED ORDER — DEXTROSE 5 % IV SOLN
Freq: Once | INTRAVENOUS | Status: AC
  Administered 2013-08-02: 08:00:00 via INTRAVENOUS

## 2013-08-02 MED ORDER — LEUCOVORIN CALCIUM INJECTION 350 MG
406.0000 mg/m2 | Freq: Once | INTRAVENOUS | Status: AC
  Administered 2013-08-02: 800 mg via INTRAVENOUS
  Filled 2013-08-02: qty 40

## 2013-08-02 MED ORDER — DEXAMETHASONE SODIUM PHOSPHATE 20 MG/5ML IJ SOLN
INTRAMUSCULAR | Status: AC
  Filled 2013-08-02: qty 5

## 2013-08-02 MED ORDER — ONDANSETRON 16 MG/50ML IVPB (CHCC)
16.0000 mg | Freq: Once | INTRAVENOUS | Status: AC
  Administered 2013-08-02: 16 mg via INTRAVENOUS

## 2013-08-02 MED ORDER — DEXTROSE 5 % IV SOLN
85.0000 mg/m2 | Freq: Once | INTRAVENOUS | Status: AC
  Administered 2013-08-02: 165 mg via INTRAVENOUS
  Filled 2013-08-02: qty 33

## 2013-08-02 NOTE — Telephone Encounter (Signed)
Appt cancelled, pt left message regarding appt.

## 2013-08-02 NOTE — Telephone Encounter (Signed)
No he does not need to keep that appointment

## 2013-08-02 NOTE — Telephone Encounter (Signed)
Pt wants to know if he needs to keep his OV scheduled for tomorrow with Dr. Ardis Hughs. Pt states he started his chemo today. Please advise.

## 2013-08-02 NOTE — Patient Instructions (Signed)
[facility] Discharge Instructions for Patients Receiving Chemotherapy  Today you received the following chemotherapy agents Oxaliplatin and Leucovorin and Camptosar and 5FU.  To help prevent nausea and vomiting after your treatment, we encourage you to take your nausea medication.   If you develop nausea and vomiting that is not controlled by your nausea medication, call the clinic.   BELOW ARE SYMPTOMS THAT SHOULD BE REPORTED IMMEDIATELY:  *FEVER GREATER THAN 100.5 F  *CHILLS WITH OR WITHOUT FEVER  NAUSEA AND VOMITING THAT IS NOT CONTROLLED WITH YOUR NAUSEA MEDICATION  *UNUSUAL SHORTNESS OF BREATH  *UNUSUAL BRUISING OR BLEEDING  TENDERNESS IN MOUTH AND THROAT WITH OR WITHOUT PRESENCE OF ULCERS  *URINARY PROBLEMS  *BOWEL PROBLEMS  UNUSUAL RASH Items with * indicate a potential emergency and should be followed up as soon as possible.  Feel free to call the clinic you have any questions or concerns. The clinic phone number is [phone].

## 2013-08-03 ENCOUNTER — Inpatient Hospital Stay (HOSPITAL_COMMUNITY)
Admission: EM | Admit: 2013-08-03 | Discharge: 2013-08-06 | DRG: 948 | Disposition: A | Discharge status: Home / Self Care | Attending: Internal Medicine | Admitting: Internal Medicine

## 2013-08-03 ENCOUNTER — Telehealth: Payer: Self-pay | Admitting: Family Medicine

## 2013-08-03 ENCOUNTER — Ambulatory Visit: Admitting: Gastroenterology

## 2013-08-03 ENCOUNTER — Encounter (HOSPITAL_COMMUNITY): Payer: Self-pay | Admitting: Emergency Medicine

## 2013-08-03 ENCOUNTER — Telehealth: Payer: Self-pay | Admitting: *Deleted

## 2013-08-03 DIAGNOSIS — C257 Malignant neoplasm of other parts of pancreas: Secondary | ICD-10-CM

## 2013-08-03 DIAGNOSIS — R52 Pain, unspecified: Secondary | ICD-10-CM

## 2013-08-03 DIAGNOSIS — I10 Essential (primary) hypertension: Secondary | ICD-10-CM | POA: Diagnosis present

## 2013-08-03 DIAGNOSIS — R109 Unspecified abdominal pain: Secondary | ICD-10-CM | POA: Diagnosis present

## 2013-08-03 DIAGNOSIS — D7389 Other diseases of spleen: Secondary | ICD-10-CM | POA: Diagnosis present

## 2013-08-03 DIAGNOSIS — Z7902 Long term (current) use of antithrombotics/antiplatelets: Secondary | ICD-10-CM

## 2013-08-03 DIAGNOSIS — K449 Diaphragmatic hernia without obstruction or gangrene: Secondary | ICD-10-CM | POA: Diagnosis present

## 2013-08-03 DIAGNOSIS — E860 Dehydration: Secondary | ICD-10-CM | POA: Diagnosis present

## 2013-08-03 DIAGNOSIS — Z79899 Other long term (current) drug therapy: Secondary | ICD-10-CM

## 2013-08-03 DIAGNOSIS — G893 Neoplasm related pain (acute) (chronic): Principal | ICD-10-CM

## 2013-08-03 DIAGNOSIS — Z66 Do not resuscitate: Secondary | ICD-10-CM | POA: Diagnosis present

## 2013-08-03 DIAGNOSIS — R112 Nausea with vomiting, unspecified: Secondary | ICD-10-CM | POA: Diagnosis present

## 2013-08-03 DIAGNOSIS — K219 Gastro-esophageal reflux disease without esophagitis: Secondary | ICD-10-CM | POA: Diagnosis present

## 2013-08-03 DIAGNOSIS — Z86718 Personal history of other venous thrombosis and embolism: Secondary | ICD-10-CM

## 2013-08-03 DIAGNOSIS — C259 Malignant neoplasm of pancreas, unspecified: Secondary | ICD-10-CM

## 2013-08-03 DIAGNOSIS — F17200 Nicotine dependence, unspecified, uncomplicated: Secondary | ICD-10-CM | POA: Diagnosis present

## 2013-08-03 HISTORY — DX: Malignant neoplasm of pancreas, unspecified: C25.9

## 2013-08-03 LAB — CBC WITH DIFFERENTIAL/PLATELET
BASOS PCT: 0 % (ref 0–1)
Basophils Absolute: 0 10*3/uL (ref 0.0–0.1)
EOS ABS: 0 10*3/uL (ref 0.0–0.7)
EOS PCT: 0 % (ref 0–5)
HEMATOCRIT: 36.7 % — AB (ref 39.0–52.0)
HEMOGLOBIN: 12.6 g/dL — AB (ref 13.0–17.0)
Lymphocytes Relative: 12 % (ref 12–46)
Lymphs Abs: 0.5 10*3/uL — ABNORMAL LOW (ref 0.7–4.0)
MCH: 27.8 pg (ref 26.0–34.0)
MCHC: 34.3 g/dL (ref 30.0–36.0)
MCV: 81 fL (ref 78.0–100.0)
MONO ABS: 0.2 10*3/uL (ref 0.1–1.0)
MONOS PCT: 3 % (ref 3–12)
Neutro Abs: 3.8 10*3/uL (ref 1.7–7.7)
Neutrophils Relative %: 85 % — ABNORMAL HIGH (ref 43–77)
Platelets: 188 10*3/uL (ref 150–400)
RBC: 4.53 MIL/uL (ref 4.22–5.81)
RDW: 14.5 % (ref 11.5–15.5)
WBC: 4.5 10*3/uL (ref 4.0–10.5)

## 2013-08-03 LAB — COMPREHENSIVE METABOLIC PANEL
ALK PHOS: 92 U/L (ref 39–117)
ALT: 18 U/L (ref 0–53)
AST: 15 U/L (ref 0–37)
Albumin: 4.1 g/dL (ref 3.5–5.2)
BILIRUBIN TOTAL: 0.6 mg/dL (ref 0.3–1.2)
BUN: 14 mg/dL (ref 6–23)
CO2: 24 mEq/L (ref 19–32)
CREATININE: 0.88 mg/dL (ref 0.50–1.35)
Calcium: 9.5 mg/dL (ref 8.4–10.5)
Chloride: 95 mEq/L — ABNORMAL LOW (ref 96–112)
GFR calc non Af Amer: >90 mL/min (ref >90)
Glucose, Bld: 132 mg/dL — ABNORMAL HIGH (ref 70–99)
Potassium: 4.2 mEq/L (ref 3.7–5.3)
Sodium: 134 mEq/L — ABNORMAL LOW (ref 137–147)
TOTAL PROTEIN: 7.6 g/dL (ref 6.0–8.3)

## 2013-08-03 LAB — PHOSPHORUS: PHOSPHORUS: 4.4 mg/dL (ref 2.3–4.6)

## 2013-08-03 LAB — LIPASE, BLOOD: LIPASE: 9 U/L — AB (ref 11–59)

## 2013-08-03 LAB — MAGNESIUM: MAGNESIUM: 2 mg/dL (ref 1.5–2.5)

## 2013-08-03 MED ORDER — ONDANSETRON HCL 4 MG/2ML IJ SOLN
4.0000 mg | Freq: Once | INTRAMUSCULAR | Status: AC
  Administered 2013-08-03: 4 mg via INTRAVENOUS
  Filled 2013-08-03: qty 2

## 2013-08-03 MED ORDER — ONDANSETRON HCL 4 MG PO TABS: 4.0000 mg | ORAL_TABLET | Freq: Four times a day (QID) | ORAL | Status: DC | PRN

## 2013-08-03 MED ORDER — HYDROMORPHONE HCL PF 2 MG/ML IJ SOLN
2.0000 mg | Freq: Once | INTRAMUSCULAR | Status: AC
  Administered 2013-08-03: 2 mg via INTRAVENOUS
  Filled 2013-08-03: qty 1

## 2013-08-03 MED ORDER — SODIUM CHLORIDE 0.9 % IV SOLN
INTRAVENOUS | Status: AC
  Administered 2013-08-03: 11:00:00 via INTRAVENOUS

## 2013-08-03 MED ORDER — HYDROMORPHONE HCL PF 2 MG/ML IJ SOLN
1.5000 mg | INTRAMUSCULAR | Status: DC | PRN
  Administered 2013-08-03 – 2013-08-04 (×8): 1.5 mg via INTRAVENOUS
  Filled 2013-08-03 (×8): qty 1

## 2013-08-03 MED ORDER — HYDROMORPHONE HCL PF 1 MG/ML IJ SOLN
1.0000 mg | Freq: Once | INTRAMUSCULAR | Status: AC
Start: 2013-08-03 — End: 2013-08-03
  Administered 2013-08-03: 1 mg via INTRAVENOUS
  Filled 2013-08-03: qty 1

## 2013-08-03 MED ORDER — DEXTROSE-NACL 5-0.9 % IV SOLN
INTRAVENOUS | Status: DC
  Administered 2013-08-03 – 2013-08-06 (×5): via INTRAVENOUS

## 2013-08-03 MED ORDER — HYDROMORPHONE HCL PF 1 MG/ML IJ SOLN
1.0000 mg | Freq: Once | INTRAMUSCULAR | Status: AC
  Administered 2013-08-03: 1 mg via INTRAVENOUS
  Filled 2013-08-03: qty 1

## 2013-08-03 MED ORDER — SODIUM CHLORIDE 0.9 % IV SOLN
Freq: Once | INTRAVENOUS | Status: AC
  Administered 2013-08-03: 04:00:00 via INTRAVENOUS

## 2013-08-03 MED ORDER — HYDROMORPHONE HCL PF 1 MG/ML IJ SOLN
1.0000 mg | INTRAMUSCULAR | Status: DC | PRN
  Administered 2013-08-03: 1 mg via INTRAVENOUS
  Filled 2013-08-03: qty 1

## 2013-08-03 MED ORDER — DEXAMETHASONE SODIUM PHOSPHATE 10 MG/ML IJ SOLN
10.0000 mg | Freq: Once | INTRAMUSCULAR | Status: AC
  Administered 2013-08-03: 10 mg via INTRAVENOUS

## 2013-08-03 MED ORDER — FENTANYL 25 MCG/HR TD PT72
25.0000 ug | MEDICATED_PATCH | TRANSDERMAL | Status: DC
  Administered 2013-08-03: 25 ug via TRANSDERMAL
  Filled 2013-08-03: qty 1

## 2013-08-03 MED ORDER — HYDROCODONE-ACETAMINOPHEN 5-325 MG PO TABS
1.0000 | ORAL_TABLET | ORAL | Status: DC | PRN
  Administered 2013-08-03 – 2013-08-05 (×2): 2 via ORAL
  Filled 2013-08-03 (×2): qty 2

## 2013-08-03 MED ORDER — HEPARIN SODIUM (PORCINE) 5000 UNIT/ML IJ SOLN
5000.0000 [IU] | Freq: Three times a day (TID) | INTRAMUSCULAR | Status: DC
Start: 2013-08-03 — End: 2013-08-04
  Administered 2013-08-03: 5000 [IU] via SUBCUTANEOUS
  Filled 2013-08-03 (×6): qty 1

## 2013-08-03 MED ORDER — ONDANSETRON HCL 4 MG/2ML IJ SOLN
4.0000 mg | Freq: Three times a day (TID) | INTRAMUSCULAR | Status: DC | PRN
  Administered 2013-08-03: 4 mg via INTRAVENOUS
  Filled 2013-08-03: qty 2

## 2013-08-03 MED ORDER — ONDANSETRON HCL 4 MG/2ML IJ SOLN
4.0000 mg | Freq: Four times a day (QID) | INTRAMUSCULAR | Status: DC | PRN
Start: 2013-08-03 — End: 2013-08-06
  Administered 2013-08-03 – 2013-08-06 (×4): 4 mg via INTRAVENOUS
  Filled 2013-08-03 (×4): qty 2

## 2013-08-03 NOTE — Telephone Encounter (Signed)
UNABLE TO COMPLETE CHEMO FOLLOW UP CALL. PT. WAS ADMITTED TO [facility].

## 2013-08-03 NOTE — Telephone Encounter (Signed)
I am over at [facility] ER for an ER shift and have seen Mr. Aida Puffer.  Thanks, Chrisandra Netters

## 2013-08-03 NOTE — H&P (Signed)
[facility] History and Physical  Ray [name] ACZ:[id number] DOB: 1955/05/23 DOA: 08/03/2013  Referring physician: Dr. Parks Ranger PCP: Chrisandra Netters, MD   Chief Complaint: abdominal discomfort and nausea  HPI: Ray [name] is a 58 y.o. male  With history of hypertension, GERD, and recently diagnosed pancreatic cancer. Patient presents to the ED complaining of the above complaints. Nothing he is aware of makes it better other than pain medication and anti-emetics. Since onset he complains of been persistent and not getting any better. The abdominal discomfort located in his upper left abdomen and radiates to the left side. On a pain pill 1-10 patient states the pain is a 10 without pain medication.   Patient is a patient of the family surface of the colon but since they are unable to admit at [facility] they have requested our evaluation and recommendations for the patient. I have contacted oncology as indicated patient was in house. Patient is followed by [name]. Started chemotherapy one day ago   Review of Systems:  Constitutional:  No weight loss, night sweats, Fevers, chills, fatigue.  HEENT:  No headaches, Difficulty swallowing,Tooth/dental problems,Sore throat,  No sneezing, itching, ear ache, nasal congestion, post nasal drip,  Cardio-vascular:  No chest pain, Orthopnea, PND, swelling in lower extremities, anasarca, dizziness, palpitations  GI:  No heartburn, indigestion,  + abdominal pain,  +nausea, vomiting, diarrhea, change in bowel habits,+ loss of appetite  Resp:  No shortness of breath with exertion or at rest. No excess mucus, no productive cough, No non-productive cough, No coughing up of blood.No change in color of mucus.No wheezing.No chest wall deformity  Skin:  no rash or lesions.  GU:  no dysuria, change in color of urine, no urgency or frequency. No flank pain.  Musculoskeletal:  No joint pain or swelling. No decreased range of motion. No  back pain.  Psych:  No change in mood or affect. No depression or anxiety. No memory loss.   Past Medical History  Diagnosis Date  . Blood transfusion   . GERD (gastroesophageal reflux disease)   . Hyperlipidemia   . Hypertension   . H/O hiatal hernia   . Arthritis     in his back, hips, knees, R shoulder   . Colon polyps     TUBULAR ADENOMA  . Splenic vein thrombosis   . Liver cyst   . Hx of pancreatitis   . Gout   . Pancreatic cancer    Past Surgical History  Procedure Laterality Date  . Hip surgery      x2 right hip- retained hardware ambulates with cane  . Shoulder surgery      right  . Hand surgery      left thumb tendon repair  . Femur hardware removal  2011    R femur- prothesis removed & replaced  . Posterior cervical fusion/foraminotomy N/A 04/20/2012    Procedure: POSTERIOR CERVICAL FUSION/FORAMINOTOMY LEVEL 1;  Surgeon: Ophelia Charter, MD;  Location: [place];  Service: Neurosurgery;  Laterality: N/A;  C1/2 laminectomies with posterior screw fixation  . Colonoscopy w/ biopsies  04/15/2011  . Eus N/A 06/10/2013    Procedure: UPPER ENDOSCOPIC ULTRASOUND (EUS) LINEAR;  Surgeon: Milus Banister, MD;  Location: [facility];  Service: Endoscopy;  Laterality: N/A;  . Eus Bilateral 07/15/2013    Procedure: ESOPHAGEAL ENDOSCOPIC ULTRASOUND (EUS) RADIAL;  Surgeon: Milus Banister, MD;  Location: [facility];  Service: Endoscopy;  Laterality: Bilateral;   Social History:  reports that he  has been smoking Cigars.  He has never used smokeless tobacco. He reports that he does not drink alcohol or use illicit drugs.  No Known Allergies  Family History  Problem Relation Age of Onset  . Colon cancer Neg Hx   . Esophageal cancer Neg Hx   . Stomach cancer Neg Hx   . Rectal cancer Neg Hx      Prior to Admission medications   Medication Sig Start Date End Date Taking? Authorizing Whittaker Lenis  amLODipine (NORVASC) 10 MG tablet Take 10 mg by mouth every morning.   Yes  Historical Janus Vlcek, MD  lidocaine-prilocaine (EMLA) cream Apply over port area 1-2 hours prior to treatment and cover with plastic wrap.  DO NOT RUB IN. 07/30/13  Yes Ladell Pier, MD  losartan (COZAAR) 100 MG tablet Take 100 mg by mouth every morning.   Yes Historical Juliany Daughety, MD  morphine (MS CONTIN) 30 MG 12 hr tablet Take 30 mg by mouth every 12 (twelve) hours.   Yes Historical Tamekia Rotter, MD  omeprazole (PRILOSEC) 40 MG capsule Take 40 mg by mouth daily.   Yes Historical Dequon Schnebly, MD  oxyCODONE-acetaminophen (PERCOCET) 10-325 MG per tablet Take 1-2 tablets by mouth every 4 (four) hours as needed for pain 07/30/13  Yes Ladell Pier, MD  Polyethyl Glycol-Propyl Glycol (SYSTANE) 0.4-0.3 % SOLN Apply 1 drop to eye 3 (three) times daily as needed (dry eyes).   Yes Historical Sumiko Ceasar, MD  prochlorperazine (COMPAZINE) 10 MG tablet Take 1 tablet (10 mg total) by mouth every 6 (six) hours as needed for nausea or vomiting. 07/29/13  Yes Ladell Pier, MD  rivaroxaban (XARELTO) 20 MG TABS tablet Take 20 mg by mouth every morning.   Yes Historical Jiana Lemaire, MD  sorbitol 70 % SOLN Take 15 ml - 30 ml by mouth 2 (two) times a days as needed for constipation 07/30/13  Yes Ladell Pier, MD   Physical Exam: Filed Vitals:   08/03/13 1214  BP: 147/87  Pulse: 71  Temp: 98.6 F (37 C)  Resp: 14    BP 147/87  Pulse 71  Temp(Src) 98.6 F (37 C) (Oral)  Resp 14  SpO2 100%  General:  Appears calm and comfortable Eyes: PERRL, normal lids, irises & conjunctiva ENT: grossly normal hearing, lips & tongue Neck: no LAD, masses or thyromegaly Cardiovascular: RRR, no m/r/g. No LE edema. Telemetry: SR, no arrhythmias  Respiratory: CTA bilaterally, no w/r/r. Normal respiratory effort. Abdomen: soft, discomfort with palpation over epigastric area, Hypoactive bowel sounds. Skin: no rash or induration seen on limited exam Musculoskeletal: grossly normal tone BUE/BLE Psychiatric: grossly normal mood and  affect, speech fluent and appropriate Neurologic: grossly non-focal.          Labs on Admission:  Basic Metabolic Panel:  Recent Labs Lab 07/30/13 1149 08/03/13 0335  NA 137 134*  K 4.4 4.2  CL  --  95*  CO2 26 24  GLUCOSE 124 132*  BUN 11.6 14  CREATININE 1.1 0.88  CALCIUM 10.0 9.5   Liver Function Tests:  Recent Labs Lab 07/30/13 1149 08/03/13 0335  AST 15 15  ALT 23 18  ALKPHOS 100 92  BILITOT 0.97 0.6  PROT 7.9 7.6  ALBUMIN 4.4 4.1    Recent Labs Lab 08/03/13 0335  LIPASE 9*   No results found for this basename: AMMONIA,  in the last 168 hours CBC:  Recent Labs Lab 07/30/13 1149 08/03/13 0335  WBC 6.7 4.5  NEUTROABS 4.5 3.8  HGB  14.0 12.6*  HCT 43.5 36.7*  MCV 86.0 81.0  PLT 233 188   Cardiac Enzymes: No results found for this basename: CKTOTAL, CKMB, CKMBINDEX, TROPONINI,  in the last 168 hours  BNP (last 3 results) No results found for this basename: PROBNP,  in the last 8760 hours CBG:  Recent Labs Lab 07/29/13 0749  GLUCAP 119*    Radiological Exams on Admission: No results found.   Assessment/Plan Active Problems:   Intractable abdominal pain - 2ary from pancreatic cancer - Pain control: will try fentanyl patch 25 mcg, norco, and dilaudid 1 mg IV q 3 hours as breakthrough. Patient was obtaining dilaudid 2 mg IV q 2 hours in the ED. We may have to up titrate pain medication regimen. - antiemetics    Dehydration - Improved after IVF's - will continue MIVF's while patient is NPO  Pancreatic cancer - chemotherapy 1 day ago - Contacted Oncology service to let oncologist know patient was in house.  Code Status: DNR Family Communication: discussed with patient and spouse at bedside. Disposition Plan: pending improvement in abdominal discomfort.  Time spent: > 55 minutes  Velvet Bathe [facility] Pager [phone]  **Disclaimer: This note may have been dictated with voice recognition software. Similar sounding words  can inadvertently be transcribed and this note may contain transcription errors which may not have been corrected upon publication of note.**

## 2013-08-03 NOTE — Progress Notes (Signed)
Discussed patient's case with [facility], retail given that patient deemed unable to be accepted to [facility] with active chemotherapy. Paged on call [facility] (Dr. Wendee Beavers) at [facility] for admission. Discussed case with Dr. Wendee Beavers, agrees and accepts patient for admission for abdominal pain, in setting of chemotherapy for pancreatic cancer. Patient to remain at [facility], and will no longer be transferred to [facility] for current admission.  Nobie Putnam, [place], PGY-1

## 2013-08-03 NOTE — ED Notes (Signed)
Pt brought in by family tonight with c/o pain and emesis  Pt has pancreatic cancer and was started on chemo yesterday  Pt states his pain is increasing and he has been vomiting  Pt had active vomiting in the parking lot on the way in to the hospital  Pt's oncologist is Dr [name]

## 2013-08-03 NOTE — Progress Notes (Signed)
Interim Progress Note  I am working an ER shift at the [employer] . Ray [name] is my primary patient at the [facility]. He has recently been diagnosed with pancreatic cancer and has presented with severe abdominal pain secondary to his malignancy. He has been awaiting transfer to [facility] for admission to the [facility] and complains of continued pain. I will manage his pain with IV pain medicine until he is able to be transferred to [facility]. Will communicate this with our FPTS inpatient team at [facility] so that they are aware.  Chrisandra Netters, MD Family Medicine PGY-2

## 2013-08-03 NOTE — Telephone Encounter (Signed)
FYI to PCP

## 2013-08-03 NOTE — Telephone Encounter (Signed)
Received call from ED. Pt is currently in ED Room 13. If Dr [name] is available, he would like for her to stop by so he can talk to her

## 2013-08-03 NOTE — Progress Notes (Signed)
On report from ED for admission to [facility], RN informed of infusing chemotherapy agent patient is receiving through porta cath until tomorrow. RN contacted IV team at [facility] and was told that they are not certified for chemotherapy agents. Charge RN notified and contacted Buyer, retail who informed the same information. Contacting MD to figure out plan and placement.

## 2013-08-03 NOTE — ED Notes (Signed)
Pt receiving Adrucil chemotherapy IV through port, began at [facility] yesterday  morning, will run until tomorrow at 1100, to be repeated every 2 weeks. Adrucil 4,750 mg in 150 mL NS running at 5.3 mL/hour.

## 2013-08-03 NOTE — Progress Notes (Signed)
Spoke with [facility] pharmacist.Stated that as per conversation with DR [name] ,80fu infusion via home pump can be discontinued.Cancer center notified to pick up their pump.

## 2013-08-03 NOTE — ED Provider Notes (Signed)
CSN: [id number]     Arrival date & time 08/03/13  0311 History   First MD Initiated Contact with Patient 08/03/13 0325     Chief Complaint  Patient presents with  . Abdominal Pain  . Emesis     (Consider location/radiation/quality/duration/timing/severity/associated sxs/prior Treatment) HPI 58 year-old male presents to the emergency department from home with complaint of abdominal pain, nausea and vomiting.  Patient recently diagnosed with pancreatic cancer, has history of chronic pancreatitis.  He has long-standing upper abdominal pain that wraps around into his back.  Patient started chemotherapy yesterday.  Patient reports throughout the day his abdominal pain has been increasing.  As the evening went on, the pain became excruciating.  He has been taking his home regimen of MS Contin and Percocet without improvement in symptoms.  Upon arrival to the emergency department, patient began vomiting.  No fevers or chills.  Pain is in same location that it normally is. Past Medical History  Diagnosis Date  . Blood transfusion   . GERD (gastroesophageal reflux disease)   . Hyperlipidemia   . Hypertension   . H/O hiatal hernia   . Arthritis     in his back, hips, knees, R shoulder   . Colon polyps     TUBULAR ADENOMA  . Splenic vein thrombosis   . Liver cyst   . Hx of pancreatitis   . Gout   . Pancreatic cancer    Past Surgical History  Procedure Laterality Date  . Hip surgery      x2 right hip- retained hardware ambulates with cane  . Shoulder surgery      right  . Hand surgery      left thumb tendon repair  . Femur hardware removal  2011    R femur- prothesis removed & replaced  . Posterior cervical fusion/foraminotomy N/A 04/20/2012    Procedure: POSTERIOR CERVICAL FUSION/FORAMINOTOMY LEVEL 1;  Surgeon: Ophelia Charter, MD;  Location: [place] [facility];  Service: Neurosurgery;  Laterality: N/A;  C1/2 laminectomies with posterior screw fixation  . Colonoscopy w/ biopsies  04/15/2011   . Eus N/A 06/10/2013    Procedure: UPPER ENDOSCOPIC ULTRASOUND (EUS) LINEAR;  Surgeon: Milus Banister, MD;  Location: [facility];  Service: Endoscopy;  Laterality: N/A;  . Eus Bilateral 07/15/2013    Procedure: ESOPHAGEAL ENDOSCOPIC ULTRASOUND (EUS) RADIAL;  Surgeon: Milus Banister, MD;  Location: [facility];  Service: Endoscopy;  Laterality: Bilateral;   Family History  Problem Relation Age of Onset  . Colon cancer Neg Hx   . Esophageal cancer Neg Hx   . Stomach cancer Neg Hx   . Rectal cancer Neg Hx    History  Substance Use Topics  . Smoking status: Current Every Day Smoker    Types: Cigars  . Smokeless tobacco: Never Used     Comment: one a day cigar x 6 months. Quit cigarettes -20 yrs ago.  . Alcohol Use: No     Comment: Quit heavy use 30 yrs ago.none now    Review of Systems  See History of Present Illness; otherwise all other systems are reviewed and negative   Allergies  Review of patient's allergies indicates no known allergies.  Home Medications   Prior to Admission medications   Medication Sig Start Date End Date Taking? Authorizing Provider  amLODipine (NORVASC) 10 MG tablet Take 10 mg by mouth every morning.    Historical Provider, MD  lidocaine-prilocaine (EMLA) cream Apply over port area 1-2 hours prior to treatment  and cover with plastic wrap.  DO NOT RUB IN. 07/30/13   Ladell Pier, MD  losartan (COZAAR) 100 MG tablet Take 100 mg by mouth every morning.    Historical Provider, MD  morphine (MS CONTIN) 30 MG 12 hr tablet Take 30 mg by mouth every 12 (twelve) hours.    Historical Provider, MD  omeprazole (PRILOSEC) 40 MG capsule TAKE 1 CAPSULE DAILY 07/29/13   Leeanne Rio, MD  oxyCODONE-acetaminophen (PERCOCET) 10-325 MG per tablet Take 1-2 tablets by mouth every 4 (four) hours as needed for pain 07/30/13   Ladell Pier, MD  Polyethyl Glycol-Propyl Glycol (SYSTANE) 0.4-0.3 % SOLN Apply 1 drop to eye 3 (three) times daily as needed (dry eyes).     Historical Provider, MD  prochlorperazine (COMPAZINE) 10 MG tablet Take 1 tablet (10 mg total) by mouth every 6 (six) hours as needed for nausea or vomiting. 07/29/13   Ladell Pier, MD  rivaroxaban (XARELTO) 20 MG TABS tablet Take 20 mg by mouth every morning.    Historical Provider, MD  sorbitol 70 % SOLN Take 15 ml - 30 ml by mouth 2 (two) times a days as needed for constipation 07/30/13   Ladell Pier, MD   BP 151/91  Pulse 99  Temp(Src) 99 F (37.2 C) (Oral)  SpO2 100% Physical Exam  Nursing note and vitals reviewed. Constitutional: He is oriented to person, place, and time. He appears well-developed and well-nourished. He appears distressed.  HENT:  Head: Normocephalic and atraumatic.  Right Ear: External ear normal.  Left Ear: External ear normal.  Nose: Nose normal.  Mouth/Throat: Oropharynx is clear and moist.  Eyes: Conjunctivae and EOM are normal. Pupils are equal, round, and reactive to light.  Neck: Normal range of motion. Neck supple. No JVD present. No tracheal deviation present. No thyromegaly present.  Cardiovascular: Normal rate, regular rhythm, normal heart sounds and intact distal pulses.  Exam reveals no gallop and no friction rub.   No murmur heard. Pulmonary/Chest: Effort normal and breath sounds normal. No stridor. No respiratory distress. He has no wheezes. He has no rales. He exhibits no tenderness.  Abdominal: Soft. Bowel sounds are normal. He exhibits no distension and no mass. There is tenderness (across upper abdomen). There is no rebound and no guarding.  Musculoskeletal: Normal range of motion. He exhibits no edema and no tenderness.  Lymphadenopathy:    He has no cervical adenopathy.  Neurological: He is alert and oriented to person, place, and time. He exhibits normal muscle tone. Coordination normal.  Skin: Skin is warm and dry. No rash noted. No erythema. No pallor.  Psychiatric: He has a normal mood and affect. His behavior is normal. Judgment  and thought content normal.    ED Course  Procedures (including critical care time) Labs Review Labs Reviewed  CBC WITH DIFFERENTIAL - Abnormal; Notable for the following:    Hemoglobin 12.6 (*)    HCT 36.7 (*)    Neutrophils Relative % 85 (*)    Lymphs Abs 0.5 (*)    All other components within normal limits  COMPREHENSIVE METABOLIC PANEL - Abnormal; Notable for the following:    Sodium 134 (*)    Chloride 95 (*)    Glucose, Bld 132 (*)    All other components within normal limits  LIPASE, BLOOD - Abnormal; Notable for the following:    Lipase 9 (*)    All other components within normal limits    Imaging Review No results  found.   EKG Interpretation None      MDM   Final diagnoses:  Malignant neoplasm of pancreas, part unspecified  Intractable pain    58 year old male with worsening pain associated with pancreatic cancer.  Patient also with nausea and vomiting recently started chemotherapy.  We'll check baseline labs, try to get pain and nausea under control.  6:07 AM After 3 mg of dilaudid, no change in pain.  Nausea controlled.  D/w [facility] resident, who accepts in transfer to [facility].   Kalman Drape, MD 08/03/13 [phone]

## 2013-08-03 NOTE — Progress Notes (Signed)
IP PROGRESS NOTE  Subjective:   Ray [name] was treated with cycle 1 FOLFIRINOX 08/02/2013. He developed difficulty swallowing after the oxaliplatin infusion. This resolved. She developed nausea and vomiting and increased abdominal pain early this morning.he reports multiple episodes of emesis.no bleeding.he denies peripheral numbness and tingling.  His chief complaint at present is abdominal pain. The pain is relieved with dilaudid.  Vital signs in last 24 hours: Blood pressure 140/88, pulse 78, temperature 98.5 F (36.9 C), temperature source Oral, resp. rate 16, height 5\' 10"  (1.778 m), weight 172 lb (78.019 kg), SpO2 100.00%.  Intake/Output from previous day: 06/29 0701 - 06/30 0700 In: -  Out: 300 [Urine:300]  Physical Exam:  HEENT: no thrush Lungs: clear bilaterally Cardiac: regular rate and rhythm Abdomen: no hepatomegaly, soft, tender in the mid and left upper abdomen Extremities: no leg edema  Portacath/PICC-without erythema  Lab Results:  Recent Labs  08/03/13 0335  WBC 4.5  HGB 12.6*  HCT 36.7*  PLT 188    BMET  Recent Labs  08/03/13 0335  NA 134*  K 4.2  CL 95*  CO2 24  GLUCOSE 132*  BUN 14  CREATININE 0.88  CALCIUM 9.5    Studies/Results: No results found.  Medications: I have reviewed the patient's current medications.  Assessment/Plan:  1. Pancreas cancer. 07/06/2013 ,CA 19-9- 2067.  CT abdomen/pelvis 07/09/2013 with interval development of amorphous soft tissue circumferentially encasing the celiac axis and tracking caudally down toward the SMA. Associated stable irregular mild distention of the main pancreatic duct in the body and tail of the pancreas with no discrete mass lesion seen. 2.2 x 1.4 cm portacaval lymph node in the hepatoduodenal ligament had increased in size from 1.3 x 1.1 cm previously. Other scattered small lymph nodes seen in the hepatoduodenal ligament.  Endoscopic ultrasound 07/15/2013 with a very vague masslike lesion  in the region of the uncinate pancreas. Fine needle aspiration showed atypical cells consistent with adenocarcinoma.  PET scan 07/29/2013 with hypermetabolic soft tissue posterior to the pancreas with an adjacent hypermetabolic lymph node, no hypermetabolic pancreas mass, no evidence of distant metastatic disease Cycle 1 FOLFIRINOX 08/02/2013 2. Abdominal pain secondary to #1. 3. Hypertension. 4. History of gout. 5. Nausea and vomiting following FOLFIRINOX chemotherapy 6. 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 The nausea is likely related to oxaliplatin or irinotecan. We discussed continuing the planned 5-fluorouracil infusion. He does not wish to continue 5-fluorouracil with this cycle.   He has pain secondary to pancreas cancer. The pain increased following chemotherapy yesterday. This may be secondary to a tumor "flare ".  Recommendations: 1. Continue intravenous hydration 2. Add Decadron for delayed nausea 3. Resume MS Contin and oxycodone when he is taking by mouth 4. Discontinue the 5-fluorouracil infusion 5. Resume xarelto when he is taking by mouth 6. Outpatient followup at the [facility] as scheduled 7. The anti-emetic regimen will be adjusted prior to cycle 2 FOLFIRINOX  I appreciate the care from the medicine service.   LOS: 0 days   Ray [name]  08/03/2013, 7:13 PM

## 2013-08-04 ENCOUNTER — Encounter

## 2013-08-04 LAB — CBC
HEMATOCRIT: 36.2 % — AB (ref 39.0–52.0)
Hemoglobin: 12.6 g/dL — ABNORMAL LOW (ref 13.0–17.0)
MCH: 28.1 pg (ref 26.0–34.0)
MCHC: 34.8 g/dL (ref 30.0–36.0)
MCV: 80.6 fL (ref 78.0–100.0)
Platelets: 181 10*3/uL (ref 150–400)
RBC: 4.49 MIL/uL (ref 4.22–5.81)
RDW: 14.6 % (ref 11.5–15.5)
WBC: 5.7 10*3/uL (ref 4.0–10.5)

## 2013-08-04 LAB — BASIC METABOLIC PANEL
BUN: 13 mg/dL (ref 6–23)
CHLORIDE: 94 meq/L — AB (ref 96–112)
CO2: 28 meq/L (ref 19–32)
Calcium: 9.2 mg/dL (ref 8.4–10.5)
Creatinine, Ser: 0.68 mg/dL (ref 0.50–1.35)
GFR calc Af Amer: >90 mL/min (ref >90)
GFR calc non Af Amer: >90 mL/min (ref >90)
GLUCOSE: 151 mg/dL — AB (ref 70–99)
POTASSIUM: 4.4 meq/L (ref 3.7–5.3)
SODIUM: 134 meq/L — AB (ref 137–147)

## 2013-08-04 MED ORDER — HYDROMORPHONE HCL PF 2 MG/ML IJ SOLN
2.0000 mg | INTRAMUSCULAR | Status: DC | PRN
  Administered 2013-08-04 – 2013-08-05 (×12): 4 mg via INTRAVENOUS
  Filled 2013-08-04 (×12): qty 2

## 2013-08-04 MED ORDER — LORAZEPAM 0.5 MG PO TABS: 0.5000 mg | ORAL_TABLET | Freq: Four times a day (QID) | ORAL | Status: DC | PRN

## 2013-08-04 MED ORDER — PROMETHAZINE HCL 25 MG/ML IJ SOLN
12.5000 mg | Freq: Four times a day (QID) | INTRAMUSCULAR | Status: DC | PRN
  Administered 2013-08-04 – 2013-08-05 (×4): 12.5 mg via INTRAVENOUS
  Filled 2013-08-04 (×4): qty 1

## 2013-08-04 NOTE — Progress Notes (Signed)
Patient has unresectable pancreatic cancer with intractable pain.  Patient is on for EUS with celiac plexus block tomorrow, 7/2, with Dr. Ardis Hughs.  Procedure at 930 am.  Patient made NPO after midnight and SQ heparin is on hold for now.

## 2013-08-04 NOTE — Progress Notes (Signed)
IP PROGRESS NOTE  Subjective:   He continues to have pain. The pain is partially relieved with IV Dilaudid, but this does not last long. He has persistent nausea and is spitting up mucous. No bowel movement. No neurologic symptoms.   Vital signs in last 24 hours: Blood pressure 146/87, pulse 77, temperature 98.6 F (37 C), temperature source Oral, resp. rate 16, height 5\' 10"  (1.778 m), weight 172 lb (78.019 kg), SpO2 100.00%.  Intake/Output from previous day: 06/30 0701 - 07/01 0700 In: 666.7 [P.O.:240; I.V.:426.7] Out: 1300 [Urine:1100; Emesis/NG output:200]  Physical Exam:  HEENT: no thrush Abdomen: no hepatomegaly, soft, tender in the mid and left upper abdomen Extremities: no leg edema  Portacath/PICC-without erythema  Lab Results:  Recent Labs  08/03/13 0335 08/04/13 0705  WBC 4.5 5.7  HGB 12.6* 12.6*  HCT 36.7* 36.2*  PLT 188 181    BMET  Recent Labs  08/03/13 0335 08/04/13 0705  NA 134* 134*  K 4.2 4.4  CL 95* 94*  CO2 24 28  GLUCOSE 132* 151*  BUN 14 13  CREATININE 0.88 0.68  CALCIUM 9.5 9.2    Studies/Results: No results found.  Medications: I have reviewed the patient's current medications.  Assessment/Plan:  1. Pancreas cancer. 07/06/2013 ,CA 19-9- 2067.  CT abdomen/pelvis 07/09/2013 with interval development of amorphous soft tissue circumferentially encasing the celiac axis and tracking caudally down toward the SMA. Associated stable irregular mild distention of the main pancreatic duct in the body and tail of the pancreas with no discrete mass lesion seen. 2.2 x 1.4 cm portacaval lymph node in the hepatoduodenal ligament had increased in size from 1.3 x 1.1 cm previously. Other scattered small lymph nodes seen in the hepatoduodenal ligament.  Endoscopic ultrasound 07/15/2013 with a very vague masslike lesion in the region of the uncinate pancreas. Fine needle aspiration showed atypical cells consistent with adenocarcinoma.  PET scan  07/29/2013 with hypermetabolic soft tissue posterior to the pancreas with an adjacent hypermetabolic lymph node, no hypermetabolic pancreas mass, no evidence of distant metastatic disease Cycle 1 FOLFIRINOX 08/02/2013 2. Abdominal pain secondary to #1. 3. Hypertension. 4. History of gout. 5. Nausea and vomiting following FOLFIRINOX chemotherapy 6. 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 I will increase the Dilaudid dose. I discussed the case with Dr. Ardis Hughs. The GI team was see him and plan for a celiac block 08/05/2013.  Recommendations: 1. Continue intravenous hydration 2. increase dose of Dilaudid 3. Resume MS Contin when taking by mouth 4. Resume xarelto when he is taking by mouth and after the celiac block   I appreciate the care from the medicine service.   LOS: 1 day   Clemence Lengyel, Dominica Severin  08/04/2013, 12:39 PM

## 2013-08-04 NOTE — Progress Notes (Signed)
PROGRESS NOTE  KAZUO DURNIL JQB:[id number] DOB: [birth year] DOA: 08/03/2013 PCP: Chrisandra Netters, MD  HPI: Ray [name] is a 58 y.o. male with history of hypertension, GERD, and recently diagnosed pancreatic cancer. Patient presents to the ED complaining of abdominal pain.  Assessment/Plan: Intractable abdominal pain - due to pancreatic cancer  - Pain control: fentanyl patch 25 mcg, norco, and dilaudid  - antiemetics  - [name] discussed with Dr. 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   Diet: NPO Fluids: NS at 125 cc/h DVT Prophylaxis: s.q. Heparin on hold for procedure. SCD  Code Status: Full Family Communication: d/w patient  Disposition Plan: inpatient, home when ready   Consultants:  Oncology   GI  Procedures:  None    Antibiotics None   HPI/Subjective: - complains of ongoing abdominal pain, nausea better.   Objective: Filed Vitals:   08/03/13 1214 08/03/13 1522 08/03/13 2102 08/04/13 0512  BP: 147/87 140/88 146/81 146/87  Pulse: 71 78 84 77  Temp: 98.6 F (37 C) 98.5 F (36.9 C) 98.6 F (37 C) 98.6 F (37 C)  TempSrc: Oral Oral Oral Oral  Resp: 14 16 18 16   Height:  5\' 10"  (1.778 m)    Weight:  78.019 kg (172 lb)    SpO2: 100% 100% 100% 100%    Intake/Output Summary (Last 24 hours) at 08/04/13 1312 Last data filed at 08/04/13 4097  Gross per 24 hour  Intake 666.67 ml  Output   1300 ml  Net -633.33 ml   Filed Weights   08/03/13 1522  Weight: 78.019 kg (172 lb)   Exam:  General:  NAD  Cardiovascular: regular rate and rhythm, without MRG  Respiratory: good air movement, clear to auscultation throughout, no wheezing, ronchi or rales  Abdomen: soft, mild  tenderness over epigastric area, positive bowel sounds  MSK: no peripheral edema  Neuro: non focal  Data Reviewed: Basic Metabolic Panel:  Recent Labs Lab 07/30/13 1149 08/03/13 0335 08/04/13 0705  NA 137 134* 134*  K 4.4 4.2 4.4  CL  --  95* 94*  CO2 26 24 28   GLUCOSE 124 132* 151*  BUN 11.6 14 13   CREATININE 1.1 0.88 0.68  CALCIUM 10.0 9.5 9.2  MG  --  2.0  --   PHOS  --  4.4  --    Liver Function Tests:  Recent Labs Lab 07/30/13 1149 08/03/13 0335  AST 15 15  ALT 23 18  ALKPHOS 100 92  BILITOT 0.97 0.6  PROT 7.9 7.6  ALBUMIN 4.4 4.1    Recent Labs Lab 08/03/13 0335  LIPASE 9*   CBC:  Recent Labs Lab 07/30/13 1149 08/03/13 0335 08/04/13 0705  WBC 6.7 4.5 5.7  NEUTROABS 4.5 3.8  --   HGB 14.0 12.6* 12.6*  HCT 43.5 36.7* 36.2*  MCV 86.0 81.0 80.6  PLT 233 188 181   CBG:  Recent Labs Lab 07/29/13 0749  GLUCAP 119*   Studies: No results found.  Scheduled Meds: . fentaNYL  25 mcg Transdermal Q72H   Continuous Infusions: . dextrose 5 % and 0.9% NaCl 100 mL/hr at 08/04/13 0854    Active Problems:   Intractable abdominal pain   Dehydration   Abdominal  discomfort   Pancreatic cancer   Time spent: 35  This note has been created with Surveyor, quantity. Any transcriptional errors are unintentional.   Marzetta Board, MD [facility] Pager [phone]. If 7 PM - 7 AM, please contact night-coverage at [email or web address], password [facility] 08/04/2013, 1:12 PM  LOS: 1 day

## 2013-08-04 NOTE — Progress Notes (Signed)
I agree with plan for EUS, celiac plexus neurolysis tomorrow for his pancreatic cancer related abdominal pain.

## 2013-08-05 ENCOUNTER — Encounter (HOSPITAL_COMMUNITY): Admitting: *Deleted

## 2013-08-05 ENCOUNTER — Inpatient Hospital Stay (HOSPITAL_COMMUNITY): Admitting: *Deleted

## 2013-08-05 ENCOUNTER — Encounter (HOSPITAL_COMMUNITY)
Admission: EM | Disposition: A | Discharge status: Home / Self Care | Payer: Self-pay | Source: Home / Self Care | Attending: Internal Medicine

## 2013-08-05 ENCOUNTER — Encounter (HOSPITAL_COMMUNITY): Payer: Self-pay | Admitting: *Deleted

## 2013-08-05 DIAGNOSIS — K861 Other chronic pancreatitis: Secondary | ICD-10-CM

## 2013-08-05 DIAGNOSIS — M109 Gout, unspecified: Secondary | ICD-10-CM

## 2013-08-05 HISTORY — PX: NEUROLYTIC CELIAC PLEXUS: SHX5435

## 2013-08-05 HISTORY — PX: EUS: SHX5427

## 2013-08-05 SURGERY — ESOPHAGEAL ENDOSCOPIC ULTRASOUND (EUS) RADIAL
Anesthesia: Monitor Anesthesia Care

## 2013-08-05 MED ORDER — LACTATED RINGERS IV SOLN
INTRAVENOUS | Status: DC | PRN
  Administered 2013-08-05: 09:00:00 via INTRAVENOUS

## 2013-08-05 MED ORDER — BUPIVACAINE HCL (PF) 0.25 % IJ SOLN
INTRAMUSCULAR | Status: AC
  Filled 2013-08-05: qty 30

## 2013-08-05 MED ORDER — DIPHENHYDRAMINE HCL 50 MG/ML IJ SOLN
INTRAMUSCULAR | Status: DC | PRN
  Administered 2013-08-05: 12.5 mg via INTRAVENOUS

## 2013-08-05 MED ORDER — SODIUM CHLORIDE 0.9 % IJ SOLN
INTRAMUSCULAR | Status: AC
  Filled 2013-08-05: qty 20

## 2013-08-05 MED ORDER — FENTANYL CITRATE 0.05 MG/ML IJ SOLN
INTRAMUSCULAR | Status: DC | PRN
  Administered 2013-08-05 (×2): 50 ug via INTRAVENOUS

## 2013-08-05 MED ORDER — PROPOFOL 10 MG/ML IV BOLUS
INTRAVENOUS | Status: AC
  Filled 2013-08-05: qty 20

## 2013-08-05 MED ORDER — AMLODIPINE BESYLATE 10 MG PO TABS
10.0000 mg | ORAL_TABLET | Freq: Every day | ORAL | Status: DC
  Administered 2013-08-05 – 2013-08-06 (×2): 10 mg via ORAL
  Filled 2013-08-05 (×3): qty 1

## 2013-08-05 MED ORDER — PROMETHAZINE HCL 25 MG/ML IJ SOLN: 6.2500 mg | INTRAMUSCULAR | Status: DC | PRN

## 2013-08-05 MED ORDER — GLYCOPYRROLATE 0.2 MG/ML IJ SOLN
INTRAMUSCULAR | Status: AC
  Filled 2013-08-05: qty 1

## 2013-08-05 MED ORDER — MORPHINE SULFATE ER 30 MG PO TBCR
30.0000 mg | EXTENDED_RELEASE_TABLET | Freq: Two times a day (BID) | ORAL | Status: DC
  Administered 2013-08-05 – 2013-08-06 (×2): 30 mg via ORAL
  Filled 2013-08-05 (×2): qty 1

## 2013-08-05 MED ORDER — METOCLOPRAMIDE HCL 5 MG/ML IJ SOLN
INTRAMUSCULAR | Status: AC
  Filled 2013-08-05: qty 2

## 2013-08-05 MED ORDER — METOCLOPRAMIDE HCL 5 MG/ML IJ SOLN
INTRAMUSCULAR | Status: DC | PRN
  Administered 2013-08-05: 10 mg via INTRAVENOUS

## 2013-08-05 MED ORDER — OXYCODONE HCL 20 MG/ML PO CONC
20.0000 mg | ORAL | Status: DC | PRN
  Administered 2013-08-06 (×3): 20 mg via ORAL
  Filled 2013-08-05 (×3): qty 1

## 2013-08-05 MED ORDER — PHENYLEPHRINE 40 MCG/ML (10ML) SYRINGE FOR IV PUSH (FOR BLOOD PRESSURE SUPPORT)
PREFILLED_SYRINGE | INTRAVENOUS | Status: AC
  Filled 2013-08-05: qty 10

## 2013-08-05 MED ORDER — PHENYLEPHRINE HCL 10 MG/ML IJ SOLN
INTRAMUSCULAR | Status: DC | PRN
  Administered 2013-08-05: 80 ug via INTRAVENOUS

## 2013-08-05 MED ORDER — ALCOHOL 98 % IV SOLN
20.0000 mL | Freq: Once | INTRAVENOUS | Status: AC
  Administered 2013-08-05: 20 mL
  Filled 2013-08-05: qty 20

## 2013-08-05 MED ORDER — ESMOLOL HCL 10 MG/ML IV SOLN
INTRAVENOUS | Status: DC | PRN
  Administered 2013-08-05 (×2): 10 mg via INTRAVENOUS

## 2013-08-05 MED ORDER — ONDANSETRON HCL 4 MG/2ML IJ SOLN
INTRAMUSCULAR | Status: AC
  Filled 2013-08-05: qty 2

## 2013-08-05 MED ORDER — LACTATED RINGERS IV SOLN
INTRAVENOUS | Status: DC
Start: 2013-08-05 — End: 2013-08-05

## 2013-08-05 MED ORDER — LIDOCAINE HCL (CARDIAC) 20 MG/ML IV SOLN
INTRAVENOUS | Status: AC
  Filled 2013-08-05: qty 5

## 2013-08-05 MED ORDER — GLYCOPYRROLATE 0.2 MG/ML IJ SOLN
INTRAMUSCULAR | Status: DC | PRN
  Administered 2013-08-05: 0.2 mg via INTRAVENOUS

## 2013-08-05 MED ORDER — PROPOFOL INFUSION 10 MG/ML OPTIME
INTRAVENOUS | Status: DC | PRN
  Administered 2013-08-05: 300 ug/kg/min via INTRAVENOUS

## 2013-08-05 MED ORDER — ONDANSETRON HCL 4 MG/2ML IJ SOLN
INTRAMUSCULAR | Status: DC | PRN
  Administered 2013-08-05: 4 mg via INTRAVENOUS

## 2013-08-05 MED ORDER — LIDOCAINE HCL (CARDIAC) 20 MG/ML IV SOLN
INTRAVENOUS | Status: DC | PRN
  Administered 2013-08-05: 50 mg via INTRAVENOUS

## 2013-08-05 MED ORDER — FENTANYL CITRATE 0.05 MG/ML IJ SOLN
INTRAMUSCULAR | Status: AC
  Filled 2013-08-05: qty 2

## 2013-08-05 MED ORDER — MEPERIDINE HCL 50 MG/ML IJ SOLN: 6.2500 mg | INTRAMUSCULAR | Status: DC | PRN

## 2013-08-05 MED ORDER — SODIUM CHLORIDE 0.9 % IV SOLN: INTRAVENOUS | Status: DC

## 2013-08-05 MED ORDER — PROPOFOL 10 MG/ML IV BOLUS
INTRAVENOUS | Status: DC | PRN
  Administered 2013-08-05: 20 mg via INTRAVENOUS

## 2013-08-05 NOTE — Transfer of Care (Signed)
Immediate Anesthesia Transfer of Care Note  Patient: Ray [name]  Procedure(s) Performed: Procedure(s): ESOPHAGEAL ENDOSCOPIC ULTRASOUND (EUS) RADIAL (N/A) NEUROLYTIC CELIAC PLEXUS (N/A)  Patient Location: Endo Recovery  Anesthesia Type:MAC  Level of Consciousness: Patient easily awoken, sedated, comfortable, cooperative, following commands, responds to stimulation.   Airway & Oxygen Therapy: Patient spontaneously breathing, ventilating well, oxygen via [place].  Post-op Assessment: Report given to PACU RN, vital signs reviewed and stable, moving all extremities.   Post vital signs: Reviewed and stable.  Complications: No apparent anesthesia complications

## 2013-08-05 NOTE — Anesthesia Preprocedure Evaluation (Signed)
Anesthesia Evaluation  Patient identified by MRN, date of birth, ID band Patient awake    Reviewed: Allergy & Precautions, H&P , NPO status , Patient's Chart, lab work & pertinent test results, reviewed documented beta blocker date and time   Airway Mallampati: II TM Distance: >3 FB Neck ROM: Full    Dental no notable dental hx. (+) Teeth Intact, Dental Advisory Given   Pulmonary neg pulmonary ROS, Current Smoker,  breath sounds clear to auscultation  Pulmonary exam normal       Cardiovascular hypertension, Pt. on medications + Peripheral Vascular Disease Rhythm:Regular Rate:Normal     Neuro/Psych Raynaud's. Cervical fusion.  Neuromuscular disease negative psych ROS   GI/Hepatic Neg liver ROS, hiatal hernia, GERD-  Medicated and Poorly Controlled,Pancreatitis, chronic    Endo/Other  negative endocrine ROS  Renal/GU negative Renal ROS  negative genitourinary   Musculoskeletal negative musculoskeletal ROS (+)   Abdominal   Peds negative pediatric ROS (+)  Hematology negative hematology ROS (+) anemia ,   Anesthesia Other Findings   Reproductive/Obstetrics negative OB ROS                           Anesthesia Physical  Anesthesia Plan  ASA: III  Anesthesia Plan: MAC   Post-op Pain Management:    Induction: Intravenous  Airway Management Planned: Nasal Cannula  Additional Equipment:   Intra-op Plan:   Post-operative Plan:   Informed Consent: I have reviewed the patients History and Physical, chart, labs and discussed the procedure including the risks, benefits and alternatives for the proposed anesthesia with the patient or authorized representative who has indicated his/her understanding and acceptance.   Dental advisory given  Plan Discussed with: CRNA  Anesthesia Plan Comments:         Anesthesia Quick Evaluation

## 2013-08-05 NOTE — Interval H&P Note (Signed)
History and Physical Interval Note:  08/05/2013 8:25 AM  Ray [name]  has presented today for surgery, with the diagnosis of Unresectable pancreatic cancer  The various methods of treatment have been discussed with the patient and family. After consideration of risks, benefits and other options for treatment, the patient has consented to  Procedure(s): ESOPHAGEAL ENDOSCOPIC ULTRASOUND (EUS) RADIAL (N/A) NEUROLYTIC CELIAC PLEXUS (N/A) as a surgical intervention .  The patient's history has been reviewed, patient examined, no change in status, stable for surgery.  I have reviewed the patient's chart and labs.  Questions were answered to the patient's satisfaction.     Milus Banister

## 2013-08-05 NOTE — Progress Notes (Signed)
PROGRESS NOTE  Ray [name] VQM:[id number] DOB: [birth year] DOA: 08/03/2013 PCP: Chrisandra Netters, MD  HPI: Ray [name] is a 58 y.o. male with history of hypertension, GERD, and recently diagnosed pancreatic cancer. Patient presents to the ED complaining of abdominal pain.  Assessment/Plan: Intractable abdominal pain - due to pancreatic cancer  - Pain control: MS contin, dilaudid. Stop fentanyl patch. S/p celiac plexus neurolysis 7/2 by Dr. Ardis Hughs. 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   Diet: NPO Fluids: NS at 125 cc/h DVT Prophylaxis: SCD  Code Status: Full Family Communication: d/w patient  Disposition Plan: inpatient, home when ready   Consultants:  Oncology   GI  Procedures:  None    Antibiotics None   HPI/Subjective: - complains of ongoing abdominal pain, nausea better.   Objective: Filed Vitals:   08/05/13 0950 08/05/13 1000 08/05/13 1010 08/05/13 1020  BP: 135/88 145/102 140/86 148/89  Pulse: 78 79 82 86  Temp:      TempSrc:      Resp: 13 16 18 16   Height:      Weight:      SpO2: 100% 100% 98% 98%    Intake/Output Summary (Last 24 hours) at 08/05/13 1210 Last data filed at 08/05/13 1000  Gross per 24 hour  Intake   1000 ml  Output   1700 ml  Net   -700 ml   Filed Weights   08/03/13 1522  Weight: 78.019 kg (172 lb)   Exam:  General:  NAD  Cardiovascular: regular rate and rhythm, without MRG  Respiratory: good air movement, clear to auscultation throughout, no wheezing, ronchi or rales  Abdomen: soft, mild tenderness over epigastric area, positive bowel sounds  MSK: no peripheral edema  Neuro: non focal  Data Reviewed: Basic Metabolic Panel:  Recent Labs Lab 07/30/13 1149 08/03/13 0335 08/04/13 0705  NA 137 134* 134*  K  4.4 4.2 4.4  CL  --  95* 94*  CO2 26 24 28   GLUCOSE 124 132* 151*  BUN 11.6 14 13   CREATININE 1.1 0.88 0.68  CALCIUM 10.0 9.5 9.2  MG  --  2.0  --   PHOS  --  4.4  --    Liver Function Tests:  Recent Labs Lab 07/30/13 1149 08/03/13 0335  AST 15 15  ALT 23 18  ALKPHOS 100 92  BILITOT 0.97 0.6  PROT 7.9 7.6  ALBUMIN 4.4 4.1    Recent Labs Lab 08/03/13 0335  LIPASE 9*   CBC:  Recent Labs Lab 07/30/13 1149 08/03/13 0335 08/04/13 0705  WBC 6.7 4.5 5.7  NEUTROABS 4.5 3.8  --   HGB 14.0 12.6* 12.6*  HCT 43.5 36.7* 36.2*  MCV 86.0 81.0 80.6  PLT 233 188 181   CBG: No results found for this basename: GLUCAP,  in the last 168 hours Studies: No results found.  Scheduled Meds: . morphine  30 mg Oral Q12H   Continuous Infusions: . dextrose 5 % and 0.9% NaCl 100 mL/hr at 08/04/13 1703  . lactated ringers      Active Problems:   Intractable abdominal pain   Dehydration   Abdominal discomfort   Pancreatic cancer   Time spent: 25  This note has been created with Surveyor, quantity. Any transcriptional errors are unintentional.  Marzetta Board, MD [facility] Pager [phone]. If 7 PM - 7 AM, please contact night-coverage at [email or web address], password [email or web address] 08/05/2013, 12:10 PM  LOS: 2 days

## 2013-08-05 NOTE — Op Note (Signed)
[facility] [place], [address]   ENDOSCOPIC ULTRASOUND PROCEDURE REPORT  PATIENT: Ray [name], Ray [name]  MR#: [id number] BIRTHDATE: 1955-03-01  GENDER: Male ENDOSCOPIST: Milus Banister, MD PROCEDURE DATE:  08/05/2013 PROCEDURE:   Upper EUS and Celiac plexus block ASA CLASS:      Class III INDICATIONS:   1.  pancreatic cancer pain; soft tissue, lesion surrounding celiac trunk. MEDICATIONS: MAC sedation, administered by CRNA  DESCRIPTION OF PROCEDURE:   After the risks benefits and alternatives of the procedure were  explained, informed consent was obtained. The patient was then placed in the left, lateral, decubitus postion and IV sedation was administered. Throughout the procedure, the patients blood pressure, pulse and oxygen saturations were monitored continuously.  Under direct visualization, the EUS scope 0464  endoscope was introduced through the mouth  and advanced to the stomach body .  Water was used as necessary to provide an acoustic interface.  Upon completion of the imaging, water was removed and the patient was sent to the recovery room in satisfactory condition.   Endoscopic findings: 1. There was a moderate amount of liquid in stomach without anatomic gastric outlet obstruction.  EUS findings: 1. The celiac trunk takeoff was clearly identified and celiac plexus neurolysis was performed using 10cc of .25% bupivicane followed by 20cc of 98% dehyrated alcohol injected at one site.  Sterile saline was injected at the site prior to the neurolysis and after the neurolysis while needle being pulled out.  Impression: Technically successful Celiac Plexus Neurolysis.  Follow clinically to guage his response.  I will advance to clear liquids today.   _______________________________ eSigned:  Milus Banister, MD 08/05/2013 9:20 AM

## 2013-08-05 NOTE — H&P (View-Only) (Signed)
IP PROGRESS NOTE  Subjective:   He continues to have pain. The pain is partially relieved with IV Dilaudid, but this does not last long. He has persistent nausea and is spitting up mucous. No bowel movement. No neurologic symptoms.   Vital signs in last 24 hours: Blood pressure 146/87, pulse 77, temperature 98.6 F (37 C), temperature source Oral, resp. rate 16, height 5\' 10"  (1.778 m), weight 172 lb (78.019 kg), SpO2 100.00%.  Intake/Output from previous day: 06/30 0701 - 07/01 0700 In: 666.7 [P.O.:240; I.V.:426.7] Out: 1300 [Urine:1100; Emesis/NG output:200]  Physical Exam:  HEENT: no thrush Abdomen: no hepatomegaly, soft, tender in the mid and left upper abdomen Extremities: no leg edema  Portacath/PICC-without erythema  Lab Results:  Recent Labs  08/03/13 0335 08/04/13 0705  WBC 4.5 5.7  HGB 12.6* 12.6*  HCT 36.7* 36.2*  PLT 188 181    BMET  Recent Labs  08/03/13 0335 08/04/13 0705  NA 134* 134*  K 4.2 4.4  CL 95* 94*  CO2 24 28  GLUCOSE 132* 151*  BUN 14 13  CREATININE 0.88 0.68  CALCIUM 9.5 9.2    Studies/Results: No results found.  Medications: I have reviewed the patient's current medications.  Assessment/Plan:  1. Pancreas cancer. 07/06/2013 ,CA 19-9- 2067.  CT abdomen/pelvis 07/09/2013 with interval development of amorphous soft tissue circumferentially encasing the celiac axis and tracking caudally down toward the SMA. Associated stable irregular mild distention of the main pancreatic duct in the body and tail of the pancreas with no discrete mass lesion seen. 2.2 x 1.4 cm portacaval lymph node in the hepatoduodenal ligament had increased in size from 1.3 x 1.1 cm previously. Other scattered small lymph nodes seen in the hepatoduodenal ligament.  Endoscopic ultrasound 07/15/2013 with a very vague masslike lesion in the region of the uncinate pancreas. Fine needle aspiration showed atypical cells consistent with adenocarcinoma.  PET scan  07/29/2013 with hypermetabolic soft tissue posterior to the pancreas with an adjacent hypermetabolic lymph node, no hypermetabolic pancreas mass, no evidence of distant metastatic disease Cycle 1 FOLFIRINOX 08/02/2013 2. Abdominal pain secondary to #1. 3. Hypertension. 4. History of gout. 5. Nausea and vomiting following FOLFIRINOX chemotherapy 6. 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 I will increase the Dilaudid dose. I discussed the case with Dr. Ardis Hughs. The GI team was see him and plan for a celiac block 08/05/2013.  Recommendations: 1. Continue intravenous hydration 2. increase dose of Dilaudid 3. Resume MS Contin when taking by mouth 4. Resume xarelto when he is taking by mouth and after the celiac block   I appreciate the care from the medicine service.   LOS: 1 day   Everitt Wenner, Dominica Severin  08/04/2013, 12:39 PM

## 2013-08-05 NOTE — Progress Notes (Signed)
IP PROGRESS NOTE  Subjective:   He continues to have pain and nausea.  He underwent the celiac block earlier today. Vital signs in last 24 hours: Blood pressure 163/102, pulse 73, temperature 98.7 F (37.1 C), temperature source Oral, resp. rate 18, height 5\' 10"  (1.778 m), weight 172 lb (78.019 kg), SpO2 98.00%.  Intake/Output from previous day: 07/01 0701 - 07/02 0700 In: -  Out: 1200 [Urine:1200]  Physical Exam:  HEENT: no thrush Abdomen: no hepatomegaly, soft, tender in the mid and left upper abdomen Extremities: no leg edema  Portacath/PICC-without erythema  Lab Results:  Recent Labs  08/03/13 0335 08/04/13 0705  WBC 4.5 5.7  HGB 12.6* 12.6*  HCT 36.7* 36.2*  PLT 188 181    BMET  Recent Labs  08/03/13 0335 08/04/13 0705  NA 134* 134*  K 4.2 4.4  CL 95* 94*  CO2 24 28  GLUCOSE 132* 151*  BUN 14 13  CREATININE 0.88 0.68  CALCIUM 9.5 9.2     Assessment/Plan:  1. Pancreas cancer. 07/06/2013 ,CA 19-9- 2067.  CT abdomen/pelvis 07/09/2013 with interval development of amorphous soft tissue circumferentially encasing the celiac axis and tracking caudally down toward the SMA. Associated stable irregular mild distention of the main pancreatic duct in the body and tail of the pancreas with no discrete mass lesion seen. 2.2 x 1.4 cm portacaval lymph node in the hepatoduodenal ligament had increased in size from 1.3 x 1.1 cm previously. Other scattered small lymph nodes seen in the hepatoduodenal ligament.  Endoscopic ultrasound 07/15/2013 with a very vague masslike lesion in the region of the uncinate pancreas. Fine needle aspiration showed atypical cells consistent with adenocarcinoma.  PET scan 07/29/2013 with hypermetabolic soft tissue posterior to the pancreas with an adjacent hypermetabolic lymph node, no hypermetabolic pancreas mass, no evidence of distant metastatic disease Cycle 1 FOLFIRINOX 08/02/2013 2. Abdominal pain secondary to  #1. 3. Hypertension. 4. History of gout. 5. Nausea and vomiting following FOLFIRINOX chemotherapy 6. 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 Dilaudid dose was increased on 7/1 with some improvement. Celiac block earlier today.  Recommendations: 1. Continue intravenous hydration 2.Continue current dose of Dilaudid 3. Resume MS Contin and oxycodone 4. Resume xarelto when he is taking by mouth   I appreciate the care from the medicine service.   LOS: 2 days   [facility] E  08/05/2013, 8:12 AM

## 2013-08-06 ENCOUNTER — Encounter (HOSPITAL_COMMUNITY): Payer: Self-pay | Admitting: Gastroenterology

## 2013-08-06 MED ORDER — MORPHINE SULFATE ER 30 MG PO TBCR: 30.0000 mg | EXTENDED_RELEASE_TABLET | Freq: Two times a day (BID) | ORAL | Status: DC

## 2013-08-06 MED ORDER — HEPARIN SOD (PORK) LOCK FLUSH 100 UNIT/ML IV SOLN
500.0000 [IU] | INTRAVENOUS | Status: AC | PRN
Start: 2013-08-06 — End: 2013-08-06
  Administered 2013-08-06: 500 [IU]

## 2013-08-06 MED ORDER — SORBITOL 70 % SOLN: 30.0000 mL | Freq: Two times a day (BID) | Status: DC

## 2013-08-06 MED ORDER — RIVAROXABAN 20 MG PO TABS
20.0000 mg | ORAL_TABLET | Freq: Every day | ORAL | Status: DC
  Filled 2013-08-06: qty 1

## 2013-08-06 MED ORDER — OXYCODONE-ACETAMINOPHEN 10-325 MG PO TABS: ORAL_TABLET | ORAL | Status: DC

## 2013-08-06 NOTE — Discharge Summary (Signed)
Physician Discharge Summary  Ray [name] HYW:[id number] DOB: Aug 04, 1955 DOA: 08/03/2013  PCP: Chrisandra Netters, MD  Admit date: 08/03/2013 Discharge date: 08/06/2013  Time spent: 35 minutes  Recommendations for Outpatient Follow-up:  1. Follow up with Dr. Learta Codding as previously arranged    Discharge Diagnoses:  Active Problems:   Intractable abdominal pain   Dehydration   Abdominal discomfort   Pancreatic cancer  Discharge Condition: stable  Diet recommendation: regular   Filed Weights   08/03/13 1522  Weight: 78.019 kg (172 lb)   History of present illness:  With history of hypertension, GERD, and recently diagnosed pancreatic cancer. Patient presents to the ED complaining of the above complaints. Nothing he is aware of makes it better other than pain medication and anti-emetics. Since onset he complains of been persistent and not getting any better. The abdominal discomfort located in his upper left abdomen and radiates to the left side. On a pain pill 1-10 patient states the pain is a 10 without pain medication. Patient is a patient of the family surface of the colon but since they are unable to admit at [facility] they have requested our evaluation and recommendations for the patient. I have contacted oncology as indicated patient was in house. Patient is followed by [name]. Started chemotherapy one day ago  Hospital Course:  Patient admitted with intractable abdominal pain due to pancreatic cancer. Dr. Learta Codding from Oncology and Dr. Ardis Hughs from GI saw patient in consultation as well. Patient's pain was initially managed with IV medications, and on 7/2 he underwent an EGD with EUS and celiac plexus neurolysis. Following the procedure, patient significantly better and his pain was controlled on oral agents. He was able to tolerate a diet without nausea/vomiting and was discharged home in stable condition to follow upw ith [name] as an outpatient.    Procedures:  EGD/EUS  EUS findings:  1. The celiac trunk takeoff was clearly identified and celiac plexus neurolysis was performed using 10cc of .25% bupivicane followed by  20cc of 98% dehyrated alcohol injected at one site. Sterile saline was injected at the site prior to the neurolysis and after the  neurolysis while needle being pulled out.  Consultations:  Oncology   GI  Discharge Exam: Filed Vitals:   08/05/13 1020 08/05/13 1521 08/05/13 2020 08/06/13 0457  BP: 148/89 150/87 147/92 140/89  Pulse: 86 78 76 80  Temp:  98.8 F (37.1 C) 98.7 F (37.1 C) 99 F (37.2 C)  TempSrc:  Oral Oral Oral  Resp: 16 17 18 18   Height:      Weight:      SpO2: 98% 99% 100% 100%   General: NAD Cardiovascular: RRR Respiratory: CTA biL  Discharge Instructions    Medication List         amLODipine 10 MG tablet  Commonly known as:  NORVASC  Take 10 mg by mouth every morning.     lidocaine-prilocaine cream  Commonly known as:  EMLA  Apply over port area 1-2 hours prior to treatment and cover with plastic wrap.  DO NOT RUB IN.     losartan 100 MG tablet  Commonly known as:  COZAAR  Take 100 mg by mouth every morning.     morphine 30 MG 12 hr tablet  Commonly known as:  MS CONTIN  Take 1 tablet (30 mg total) by mouth every 12 (twelve) hours.     omeprazole 40 MG capsule  Commonly known as:  PRILOSEC  Take 40  mg by mouth daily.     oxyCODONE-acetaminophen 10-325 MG per tablet  Commonly known as:  PERCOCET  Take 1-2 tablets by mouth every 4 (four) hours as needed for pain     prochlorperazine 10 MG tablet  Commonly known as:  COMPAZINE  Take 1 tablet (10 mg total) by mouth every 6 (six) hours as needed for nausea or vomiting.     rivaroxaban 20 MG Tabs tablet  Commonly known as:  XARELTO  Take 20 mg by mouth every morning.     sorbitol 70 % Soln  Take 15 ml - 30 ml by mouth 2 (two) times a days as needed for constipation     SYSTANE 0.4-0.3 % Soln  Generic drug:   Polyethyl Glycol-Propyl Glycol  Apply 1 drop to eye 3 (three) times daily as needed (dry eyes).       Follow-up Information   Follow up with Chrisandra Netters, MD. Schedule an appointment as soon as possible for a visit in 4 weeks.   Specialty:  Family Medicine   Contact information:   [place] [address] [phone]       Follow up with Betsy Coder, MD In 10 days.   Specialty:  Oncology   Contact information:   [place] [address] [phone]      The results of significant diagnostics from this hospitalization (including imaging, microbiology, ancillary and laboratory) are listed below for reference.    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 Patient now with worsening weight loss and left upper quadrant pain. Endoscopic ultrasound suggested changes of chronic pancreatitis and previous CT/ MRI a showed irregular dilatation of the main pancreatic duct.  EXAM: CT ABDOMEN AND PELVIS WITHOUT AND WITH CONTRAST  TECHNIQUE: Multidetector CT imaging of the abdomen and pelvis was performed following the standard protocol before and following the bolus administration of intravenous contrast.  CONTRAST:  174mL OMNIPAQUE IOHEXOL 350 MG/ML SOLN  COMPARISON:  MRI from 05/17/2013.  CT scan from 05/05/2013.  FINDINGS: Lung Bases: Imaging through the lung bases shows dependent atelectasis bilaterally.  Liver: 1 cm low-density lesion in the dome of the lateral segment left liver is unchanged since the prior CT scan. Slightly heterogeneous perfusion in the inferior right liver is nonspecific, but may be related to the altered flow from splenic vein thrombosis.  Spleen: Unremarkable.  Stomach: Normal in appearance.  Pancreas: As on previous imaging, there is no discrete mass identified in the pancreas and there is persistent mild irregular  dilatation of the main pancreatic duct in the body and tail of pancreas. What has changed in the interval is that the patient has developed increasing soft tissue attenuation infiltrating around the celiac axis (encasing the common hepatic and left gastric arteries). There is circumferential encasement of the proximal splenic artery as well. All 3 arteries remain patent. The amorphous soft tissue is visualized on axial imaging, but is also well demonstrated on the coronal and sagittal reformations (see image 49 of series 605 and image 76 of series 603). From the region of the celiac axis, the amorphous soft tissue tracks caudally down the retroperitoneal fat towards the superior mesenteric artery, but no encasement of that vessel is evident.  Gallbladder/Biliary Tree: No evidence for gallstones. Trace prominence of the intrahepatic bile ducts is evident. No extrahepatic biliary duct dilatation.  Kidneys/Adrenals: No adrenal nodule. No enhancing  renal mass or hydronephrosis.  Bowel Loops: Duodenum is unremarkable. No bowel obstruction. Terminal ileum and appendix are normal. Prominent stool volume noted in the right colon incidentally. No colonic diverticulitis.  Nodes: 2.2 x 1.4 cm portal caval lymph node in the hepatoduodenal ligament has increased in size from 1.3 x 1.1 cm previously. Other scattered small lymph nodes are seen in the hepatoduodenal ligament. No gastrohepatic ligament lymphadenopathy.  Vasculature: No abdominal aortic aneurysm. There has dense resolution of some of the clot in the splenic vein towards the splenic hilum. However the splenic vein tracking into the porta splenic confluence remains completely occluded. This is best appreciated on the delayed images (axial image 11 of series 8).  Pelvic Genitourinary: Bladder is decompressed. Prostate gland is enlarged.  Bones/Musculoskeletal: Patient is status post ORIF for right hip fracture. Bone windows reveal no worrisome lytic or sclerotic osseous  lesions.  Body Wall: No evidence for body wall hernia.  Other: No intraperitoneal free fluid.  IMPRESSION: Interval development of a amorphous soft tissue circumferentially encasing the celiac axis and tracking caudally down towards the SMA. This is associated with stable irregular mild distention of the main pancreatic duct in the body and tail of the pancreas although no discrete mass lesion can be identified within the pancreatic parenchyma proper. The splenic vein remains completely occluded were it tracks through the region of amorphous retroperitoneal soft tissue. Taken together, these findings are highly concerning for pancreatic adenocarcinoma. PET-CT may prove helpful to further evaluate.   Electronically Signed   By: Misty Stanley M.D.   On: 07/09/2013 12:16   Nm Pet Image Initial (pi) Skull Base To Thigh  07/29/2013   CLINICAL DATA:  Initial treatment strategy for pancreatic cancer.  EXAM: NUCLEAR MEDICINE PET SKULL BASE TO THIGH  TECHNIQUE: 9.3 mCi F-18 FDG was injected intravenously. Full-ring PET imaging was performed from the skull base to thigh after the radiotracer. CT data was obtained and used for attenuation correction and anatomic localization.  FASTING BLOOD GLUCOSE:  Value: 119 mg/dl  COMPARISON:  CT scan 07/09/2013 and MRI 06/16/2013  FINDINGS: NECK  No hypermetabolic lymph nodes in the neck.  CHEST  No hypermetabolic mediastinal or hilar nodes. No suspicious pulmonary nodules on the CT scan.  ABDOMEN/PELVIS  The amorphous ill-defined soft tissue density just posterior to the pancreas and surrounding the celiac axis vessels demonstrates hypermetabolism with SUV max of 4.2. The adjacent lymph node located just anterior to the IVC has an SUV max of 2.87. I do not see a discrete pancreatic lesion demonstrating hypermetabolism.  No findings for metastatic disease involving the liver.  SKELETON  No focal hypermetabolic activity to suggest skeletal metastasis.  IMPRESSION: 1. Hypermetabolic mass  just posterior to the pancreas and surrounding the celiac axis vessels consistent with neoplasm. There is also what adjacent hypermetabolic lymph node. 2. No definite pancreatic parenchymal lesion or hypermetabolism. 3. No findings for metastatic disease involving the liver.   Electronically Signed   By: Kalman Jewels M.D.   On: 07/29/2013 10:19   Ir Fluoro Guide Cv Line Right  07/26/2013   CLINICAL DATA:  Pancreas cancer  EXAM: RIGHT INTERNAL JUGULAR SINGLE LUMEN POWER PORT CATHETER INSERTION  Date:  6/22/20156/22/2015 12:37 PM  Radiologist:  Jerilynn Mages. Daryll Brod, MD  Guidance:  ULTRASOUND AND FLUOROSCOPIC  FLUOROSCOPY TIME:  1.3 minutes  MEDICATIONS AND MEDICAL HISTORY: 2 G ANCEFadministered within 1 hour of the procedure.VERSED AND FENTANYL FOR CONSCIOUS SEDATION  ANESTHESIA/SEDATION: 30 min  CONTRAST:  None.  COMPLICATIONS: No immediate  PROCEDURE: Informed consent was obtained from the patient following explanation of the procedure, risks, benefits and alternatives. The patient understands, agrees and consents for the procedure. All questions were addressed. A time out was performed.  Maximal barrier sterile technique utilized including caps, mask, sterile gowns, sterile gloves, large sterile drape, hand hygiene, and 2% chlorhexidine scrub.  Under sterile conditions and local anesthesia, right internal jugular micropuncture venous access was performed. Access was performed with ultrasound. Images were obtained for documentation. A guide wire was inserted followed by a transitional dilator. This allowed insertion of a guide wire and catheter into the IVC. Measurements were obtained from the SVC / RA junction back to the right IJ venotomy site. In the right infraclavicular chest, a subcutaneous pocket was created over the second anterior rib. This was done under sterile conditions and local anesthesia. 1% lidocaine with epinephrine was utilized for this. A 2.5 cm incision was made in the skin. Blunt dissection  was performed to create a subcutaneous pocket over the right pectoralis major muscle. The pocket was flushed with saline vigorously. There was adequate hemostasis. The port catheter was assembled and checked for leakage. The port catheter was secured in the pocket with two retention sutures. The tubing was tunneled subcutaneously to the right venotomy site and inserted into the SVC/RA junction through a valved peel-away sheath. Position was confirmed with fluoroscopy. Images were obtained for documentation. The patient tolerated the procedure well. No immediate complications. Incisions were closed in a two layer fashion with 4 - 0 Vicryl suture. Dermabond was applied to the skin. The port catheter was accessed, blood was aspirated followed by saline and heparin flushes. Needle was removed. A dry sterile dressing was applied.  IMPRESSION: Ultrasound and fluoroscopically guided right internal jugular single lumen power port catheter insertion. Tip in the SVC/RA junction. Catheter ready for use.   Electronically Signed   By: Daryll Brod M.D.   On: 07/26/2013 13:04   Ir US Guide Vasc Access Right  07/26/2013   CLINICAL DATA:  Pancreas cancer  EXAM: RIGHT INTERNAL JUGULAR SINGLE LUMEN POWER PORT CATHETER INSERTION  Date:  6/22/20156/22/2015 12:37 PM  Radiologist:  Jerilynn Mages. Daryll Brod, MD  Guidance:  ULTRASOUND AND FLUOROSCOPIC  FLUOROSCOPY TIME:  1.3 minutes  MEDICATIONS AND MEDICAL HISTORY: 2 G ANCEFadministered within 1 hour of the procedure.VERSED AND FENTANYL FOR CONSCIOUS SEDATION  ANESTHESIA/SEDATION: 30 min  CONTRAST:  None.  COMPLICATIONS: No immediate  PROCEDURE: Informed consent was obtained from the patient following explanation of the procedure, risks, benefits and alternatives. The patient understands, agrees and consents for the procedure. All questions were addressed. A time out was performed.  Maximal barrier sterile technique utilized including caps, mask, sterile gowns, sterile gloves, large sterile  drape, hand hygiene, and 2% chlorhexidine scrub.  Under sterile conditions and local anesthesia, right internal jugular micropuncture venous access was performed. Access was performed with ultrasound. Images were obtained for documentation. A guide wire was inserted followed by a transitional dilator. This allowed insertion of a guide wire and catheter into the IVC. Measurements were obtained from the SVC / RA junction back to the right IJ venotomy site. In the right infraclavicular chest, a subcutaneous pocket was created over the second anterior rib. This was done under sterile conditions and local anesthesia. 1% lidocaine with epinephrine was utilized for this. A 2.5 cm incision was made in the skin. Blunt dissection was performed to create a subcutaneous pocket over the right pectoralis major muscle. The pocket  was flushed with saline vigorously. There was adequate hemostasis. The port catheter was assembled and checked for leakage. The port catheter was secured in the pocket with two retention sutures. The tubing was tunneled subcutaneously to the right venotomy site and inserted into the SVC/RA junction through a valved peel-away sheath. Position was confirmed with fluoroscopy. Images were obtained for documentation. The patient tolerated the procedure well. No immediate complications. Incisions were closed in a two layer fashion with 4 - 0 Vicryl suture. Dermabond was applied to the skin. The port catheter was accessed, blood was aspirated followed by saline and heparin flushes. Needle was removed. A dry sterile dressing was applied.  IMPRESSION: Ultrasound and fluoroscopically guided right internal jugular single lumen power port catheter insertion. Tip in the SVC/RA junction. Catheter ready for use.   Electronically Signed   By: Daryll Brod M.D.   On: 07/26/2013 13:04   Labs: Basic Metabolic Panel:  Recent Labs Lab 08/03/13 0335 08/04/13 0705  NA 134* 134*  K 4.2 4.4  CL 95* 94*  CO2 24 28   GLUCOSE 132* 151*  BUN 14 13  CREATININE 0.88 0.68  CALCIUM 9.5 9.2  MG 2.0  --   PHOS 4.4  --    Liver Function Tests:  Recent Labs Lab 08/03/13 0335  AST 15  ALT 18  ALKPHOS 92  BILITOT 0.6  PROT 7.6  ALBUMIN 4.1    Recent Labs Lab 08/03/13 0335  LIPASE 9*   CBC:  Recent Labs Lab 08/03/13 0335 08/04/13 0705  WBC 4.5 5.7  NEUTROABS 3.8  --   HGB 12.6* 12.6*  HCT 36.7* 36.2*  MCV 81.0 80.6  PLT 188 181    Signed:  Zebadiah Willert  [facility] 08/06/2013, 4:03 PM

## 2013-08-06 NOTE — Progress Notes (Signed)
IP PROGRESS NOTE  Subjective:  His pain is much better today. The nausea is also better. Oxycodone relieve the pain last night.   Vital signs in last 24 hours: Blood pressure 140/89, pulse 80, temperature 99 F (37.2 C), temperature source Oral, resp. rate 18, height 5\' 10"  (1.778 m), weight 172 lb (78.019 kg), SpO2 100.00%.  Intake/Output from previous day: 07/02 0701 - 07/03 0700 In: 2945 [P.O.:290; I.V.:2655] Out: 900 [Urine:900]  Physical Exam:  HEENT: no thrush Lungs: Clear bilaterally Cardiac: Regular rate and rhythm Abdomen: no hepatomegaly, soft, mild tenderness in the mid and left upper abdomen Extremities: no leg edema  Portacath/PICC-without erythema  Lab Results:  Recent Labs  08/04/13 0705  WBC 5.7  HGB 12.6*  HCT 36.2*  PLT 181    BMET  Recent Labs  08/04/13 0705  NA 134*  K 4.4  CL 94*  CO2 28  GLUCOSE 151*  BUN 13  CREATININE 0.68  CALCIUM 9.2     Assessment/Plan:  1. Pancreas cancer. 07/06/2013 ,CA 19-9- 2067.  CT abdomen/pelvis 07/09/2013 with interval development of amorphous soft tissue circumferentially encasing the celiac axis and tracking caudally down toward the SMA. Associated stable irregular mild distention of the main pancreatic duct in the body and tail of the pancreas with no discrete mass lesion seen. 2.2 x 1.4 cm portacaval lymph node in the hepatoduodenal ligament had increased in size from 1.3 x 1.1 cm previously. Other scattered small lymph nodes seen in the hepatoduodenal ligament.  Endoscopic ultrasound 07/15/2013 with a very vague masslike lesion in the region of the uncinate pancreas. Fine needle aspiration showed atypical cells consistent with adenocarcinoma.  PET scan 07/29/2013 with hypermetabolic soft tissue posterior to the pancreas with an adjacent hypermetabolic lymph node, no hypermetabolic pancreas mass, no evidence of distant metastatic disease Cycle 1 FOLFIRINOX 08/02/2013 2. Abdominal pain secondary to  #1.  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 Hypertension. 4. History of gout. 5. Nausea and vomiting following FOLFIRINOX chemotherapy 6. 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 He is tolerating a clear liquid diet. He appears stable for discharge from an oncology standpoint.  Recommendations: 1. continue MS Contin and Percocet 10/325 for pain 2. sorbitol for constipation 3. advance diet as tolerated 4. Resume xarelto at discharge 5. followup as scheduled at the [facility]     I appreciate the care from the medicine service.   LOS: 3 days   [place]  08/06/2013, 9:25 AM

## 2013-08-06 NOTE — Progress Notes (Signed)
Placed SCD's on pt at 2000 and pt removed at 2200.  Purpose and importance explained.  Pt to restart Xarelto in am per MD note.

## 2013-08-06 NOTE — Progress Notes (Signed)
UR completed 

## 2013-08-12 ENCOUNTER — Telehealth: Payer: Self-pay | Admitting: *Deleted

## 2013-08-12 NOTE — Telephone Encounter (Signed)
Called pharmacy and was told he filled MS Contin 30mg  on 07/21/13 for #60 written by Ned Card, NP. Was given script for #60 MS Contin 30 mg on 08/06/13 by Dr. Marzetta Board (hospitalist). Too soon to fill according to pharmacy unless dose has been increased. [name] notified of issue. Will call patient tomorrow to be sure he is taking it correctly and not getting this one mixed up with his prn oxycodone/apap.

## 2013-08-12 NOTE — Telephone Encounter (Signed)
Left VM that he is having trouble getting his morphine filled at [facility] on [address] because "the dates are off". Requested return call or to call pharmacy.

## 2013-08-13 ENCOUNTER — Telehealth: Payer: Self-pay | Admitting: *Deleted

## 2013-08-13 NOTE — Telephone Encounter (Signed)
Spoke with pt, he reports he was told to increase MS Contin before he was seen in the ED, therefore he will run out early. Pharmacy will not fill Rx written 08/06/13. Pt has #8 tabs left (4 days supply). Instructed him to fill Rx on 7/14. Spoke with Horris Latino at [facility]- she will make a note that pt may fill MS Contin 3 days early this month.

## 2013-08-15 ENCOUNTER — Other Ambulatory Visit: Payer: Self-pay | Admitting: Oncology

## 2013-08-15 NOTE — Anesthesia Postprocedure Evaluation (Signed)
  Anesthesia Post-op Note  Patient: Ray [name]  Procedure(s) Performed: Procedure(s) (LRB): ESOPHAGEAL ENDOSCOPIC ULTRASOUND (EUS) RADIAL (N/A) NEUROLYTIC CELIAC PLEXUS (N/A)  Patient Location: PACU  Anesthesia Type: MAC  Level of Consciousness: awake and alert   Airway and Oxygen Therapy: Patient Spontanous Breathing  Post-op Pain: mild  Post-op Assessment: Post-op Vital signs reviewed, Patient's Cardiovascular Status Stable, Respiratory Function Stable, Patent Airway and No signs of Nausea or vomiting  Last Vitals:  Filed Vitals:   08/06/13 0457  BP: 140/89  Pulse: 80  Temp: 37.2 C  Resp: 18    Post-op Vital Signs: stable   Complications: No apparent anesthesia complications

## 2013-08-16 ENCOUNTER — Telehealth: Payer: Self-pay | Admitting: Oncology

## 2013-08-16 ENCOUNTER — Other Ambulatory Visit (HOSPITAL_BASED_OUTPATIENT_CLINIC_OR_DEPARTMENT_OTHER)

## 2013-08-16 ENCOUNTER — Ambulatory Visit (HOSPITAL_BASED_OUTPATIENT_CLINIC_OR_DEPARTMENT_OTHER)

## 2013-08-16 ENCOUNTER — Other Ambulatory Visit: Payer: Self-pay | Admitting: *Deleted

## 2013-08-16 ENCOUNTER — Ambulatory Visit (HOSPITAL_BASED_OUTPATIENT_CLINIC_OR_DEPARTMENT_OTHER): Admitting: Oncology

## 2013-08-16 VITALS — BP 115/73 | HR 69 | Temp 98.0°F | Resp 16

## 2013-08-16 VITALS — BP 120/73 | HR 82 | Temp 98.7°F | Resp 18 | Ht 70.0 in | Wt 166.5 lb

## 2013-08-16 DIAGNOSIS — R112 Nausea with vomiting, unspecified: Secondary | ICD-10-CM

## 2013-08-16 DIAGNOSIS — C257 Malignant neoplasm of other parts of pancreas: Secondary | ICD-10-CM

## 2013-08-16 DIAGNOSIS — R109 Unspecified abdominal pain: Secondary | ICD-10-CM

## 2013-08-16 DIAGNOSIS — I10 Essential (primary) hypertension: Secondary | ICD-10-CM

## 2013-08-16 DIAGNOSIS — Z5111 Encounter for antineoplastic chemotherapy: Secondary | ICD-10-CM

## 2013-08-16 DIAGNOSIS — C259 Malignant neoplasm of pancreas, unspecified: Secondary | ICD-10-CM

## 2013-08-16 DIAGNOSIS — D7389 Other diseases of spleen: Secondary | ICD-10-CM

## 2013-08-16 LAB — CBC WITH DIFFERENTIAL/PLATELET
BASO%: 0.8 % (ref 0.0–2.0)
Basophils Absolute: 0 10*3/uL (ref 0.0–0.1)
EOS%: 0.9 % (ref 0.0–7.0)
Eosinophils Absolute: 0 10*3/uL (ref 0.0–0.5)
HCT: 35.5 % — ABNORMAL LOW (ref 38.4–49.9)
HEMOGLOBIN: 11.8 g/dL — AB (ref 13.0–17.1)
LYMPH%: 38.8 % (ref 14.0–49.0)
MCH: 28.4 pg (ref 27.2–33.4)
MCHC: 33.1 g/dL (ref 32.0–36.0)
MCV: 85.8 fL (ref 79.3–98.0)
MONO#: 0.4 10*3/uL (ref 0.1–0.9)
MONO%: 10.2 % (ref 0.0–14.0)
NEUT#: 2.1 10*3/uL (ref 1.5–6.5)
NEUT%: 49.3 % (ref 39.0–75.0)
Platelets: 247 10*3/uL (ref 140–400)
RBC: 4.14 10*6/uL — AB (ref 4.20–5.82)
RDW: 15 % — ABNORMAL HIGH (ref 11.0–14.6)
WBC: 4.2 10*3/uL (ref 4.0–10.3)
lymph#: 1.6 10*3/uL (ref 0.9–3.3)

## 2013-08-16 LAB — COMPREHENSIVE METABOLIC PANEL (CC13)
ALBUMIN: 3.8 g/dL (ref 3.5–5.0)
ALK PHOS: 93 U/L (ref 40–150)
ALT: 22 U/L (ref 0–55)
AST: 14 U/L (ref 5–34)
Anion Gap: 15 mEq/L — ABNORMAL HIGH (ref 3–11)
BUN: 10.2 mg/dL (ref 7.0–26.0)
CALCIUM: 9.3 mg/dL (ref 8.4–10.4)
CHLORIDE: 100 meq/L (ref 98–109)
CO2: 23 mEq/L (ref 22–29)
Creatinine: 1 mg/dL (ref 0.7–1.3)
Glucose: 134 mg/dl (ref 70–140)
Potassium: 3.7 mEq/L (ref 3.5–5.1)
SODIUM: 137 meq/L (ref 136–145)
TOTAL PROTEIN: 7.1 g/dL (ref 6.4–8.3)
Total Bilirubin: 0.64 mg/dL (ref 0.20–1.20)

## 2013-08-16 MED ORDER — ATROPINE SULFATE 1 MG/ML IJ SOLN: 0.5000 mg | Freq: Once | INTRAMUSCULAR | Status: DC | PRN

## 2013-08-16 MED ORDER — IRINOTECAN HCL CHEMO INJECTION 100 MG/5ML
140.0000 mg/m2 | Freq: Once | INTRAVENOUS | Status: AC
  Administered 2013-08-16: 276 mg via INTRAVENOUS
  Filled 2013-08-16: qty 13.8

## 2013-08-16 MED ORDER — LEUCOVORIN CALCIUM INJECTION 350 MG
400.0000 mg/m2 | Freq: Once | INTRAVENOUS | Status: AC
  Administered 2013-08-16: 788 mg via INTRAVENOUS
  Filled 2013-08-16: qty 39.4

## 2013-08-16 MED ORDER — RIVAROXABAN 20 MG PO TABS: 20.0000 mg | ORAL_TABLET | Freq: Every morning | ORAL | Status: DC

## 2013-08-16 MED ORDER — SODIUM CHLORIDE 0.9 % IV SOLN
150.0000 mg | Freq: Once | INTRAVENOUS | Status: AC
  Administered 2013-08-16: 150 mg via INTRAVENOUS
  Filled 2013-08-16: qty 5

## 2013-08-16 MED ORDER — PALONOSETRON HCL INJECTION 0.25 MG/5ML
0.2500 mg | Freq: Once | INTRAVENOUS | Status: AC
  Administered 2013-08-16: 0.25 mg via INTRAVENOUS

## 2013-08-16 MED ORDER — SODIUM CHLORIDE 0.9 % IV SOLN
2400.0000 mg/m2 | INTRAVENOUS | Status: DC
  Administered 2013-08-16: 4750 mg via INTRAVENOUS
  Filled 2013-08-16: qty 95

## 2013-08-16 MED ORDER — PALONOSETRON HCL INJECTION 0.25 MG/5ML
INTRAVENOUS | Status: AC
  Filled 2013-08-16: qty 5

## 2013-08-16 MED ORDER — DEXAMETHASONE SODIUM PHOSPHATE 20 MG/5ML IJ SOLN
12.0000 mg | Freq: Once | INTRAMUSCULAR | Status: AC
  Administered 2013-08-16: 12 mg via INTRAVENOUS

## 2013-08-16 MED ORDER — DEXAMETHASONE SODIUM PHOSPHATE 20 MG/5ML IJ SOLN
INTRAMUSCULAR | Status: AC
  Filled 2013-08-16: qty 5

## 2013-08-16 MED ORDER — OXALIPLATIN CHEMO INJECTION 100 MG/20ML
85.0000 mg/m2 | Freq: Once | INTRAVENOUS | Status: AC
  Administered 2013-08-16: 165 mg via INTRAVENOUS
  Filled 2013-08-16: qty 33

## 2013-08-16 MED ORDER — DEXTROSE 5 % IV SOLN
Freq: Once | INTRAVENOUS | Status: AC
  Administered 2013-08-16: 10:00:00 via INTRAVENOUS

## 2013-08-16 NOTE — Progress Notes (Signed)
Explained to patient what makes him Fall Risk and he understands. Reviewed policy and provided with Fall Prevention in Home information on AVS. Yellow armband applied.

## 2013-08-16 NOTE — Telephone Encounter (Signed)
appts complete. lmonvm for pt and pt to get new schedule when he comes back in for pump D/C 7/15.

## 2013-08-16 NOTE — Progress Notes (Signed)
At end of infusion, pt began to c/o a "thickness" of his tongue.  Pt slurring speech slightly.  Pt stated "this happened the last time, but feels fine otherwise."  Vital signs taken, no apparent distress.  [name] notified.  Instructed to watch patients about 15 minutes to observe for other adverse effects.  Will continue to monitor patient.

## 2013-08-16 NOTE — Progress Notes (Signed)
  [facility] OFFICE PROGRESS NOTE   Diagnosis: Pancreas cancer  INTERVAL HISTORY:   He was admitted with severe abdominal pain and nausea/vomiting following cycle 1 FOLFIRINOX. He underwent a celiac block 08/05/2013. He reports the abdominal pain is improved. He continues as needed oxycodone. His bowels are moving with sorbitol. Ray [name] reports an improved energy level. No neuropathy symptoms.  Objective:  Vital signs in last 24 hours:  Blood pressure 120/73, pulse 82, temperature 98.7 F (37.1 C), temperature source Oral, resp. rate 18, height 5\' 10"  (1.778 m), weight 166 lb 8 oz (75.524 kg), SpO2 100.00%.    HEENT: No thrush or ulcers Resp: Lungs clear bilaterally Cardio: Regular rate and rhythm GI: No hepatomegaly, no mass, mild tenderness in the mid upper and Vascular: No leg edema  Skin: Palms without erythema   Portacath/PICC-without erythema  Lab Results:  Lab Results  Component Value Date   WBC 4.2 08/16/2013   HGB 11.8* 08/16/2013   HCT 35.5* 08/16/2013   MCV 85.8 08/16/2013   PLT 247 08/16/2013   NEUTROABS 2.1 08/16/2013     Medications: I have reviewed the patient's current medications.  Assessment/Plan: 1. Pancreas cancer. 07/06/2013 ,CA 19-9- 2067.  CT abdomen/pelvis 07/09/2013 with interval development of amorphous soft tissue circumferentially encasing the celiac axis and tracking caudally down toward the SMA. Associated stable irregular mild distention of the main pancreatic duct in the body and tail of the pancreas with no discrete mass lesion seen. 2.2 x 1.4 cm portacaval lymph node in the hepatoduodenal ligament had increased in size from 1.3 x 1.1 cm previously. Other scattered small lymph nodes seen in the hepatoduodenal ligament.  Endoscopic ultrasound 07/15/2013 with a very vague masslike lesion in the region of the uncinate pancreas. Fine needle aspiration showed atypical cells consistent with adenocarcinoma.  PET scan 07/29/2013 with  hypermetabolic soft tissue posterior to the pancreas with an adjacent hypermetabolic lymph node, no hypermetabolic pancreas mass, no evidence of distant metastatic disease  Cycle 1 FOLFIRINOX 08/02/2013 2. Abdominal pain secondary to #1. 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 Hypertension. 4. History of gout. 5. 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 has an improved performance status. His pain is under better control. The plan is to proceed with cycle 2 FOLFIRINOX today. Cycle 1 was complicated by acute/delayed nausea and vomiting. We adjusted the anti-emetic regimen by adding Aloxi and Emend today. I does reduce the irinotecan. He knows to contact us for nausea following chemotherapy. He will continue the current narcotic regimen.  [name] will return for an office visit and chemotherapy in 2 weeks. We will check the CA 19-9 when he returns in 2 weeks.  Betsy Coder, MD  08/16/2013  9:17 AM

## 2013-08-16 NOTE — Patient Instructions (Signed)
[facility] Discharge Instructions for Patients Receiving Chemotherapy  Today you received the following chemotherapy agents oxaliplatin/leucovorin/irinotecan/flourouracil.   To help prevent nausea and vomiting after your treatment, we encourage you to take your nausea medication as directed.  If you develop nausea and vomiting that is not controlled by your nausea medication, call the clinic.   BELOW ARE SYMPTOMS THAT SHOULD BE REPORTED IMMEDIATELY:  *FEVER GREATER THAN 100.5 F  *CHILLS WITH OR WITHOUT FEVER  NAUSEA AND VOMITING THAT IS NOT CONTROLLED WITH YOUR NAUSEA MEDICATION  *UNUSUAL SHORTNESS OF BREATH  *UNUSUAL BRUISING OR BLEEDING  TENDERNESS IN MOUTH AND THROAT WITH OR WITHOUT PRESENCE OF ULCERS  *URINARY PROBLEMS  *BOWEL PROBLEMS  UNUSUAL RASH Items with * indicate a potential emergency and should be followed up as soon as possible.  Feel free to call the clinic you have any questions or concerns. The clinic phone number is [phone].

## 2013-08-16 NOTE — Telephone Encounter (Signed)
Patient declined written script for Xarleto-wants nurse to send to pharmacy electronically. [facility] (uses [employer])

## 2013-08-16 NOTE — Patient Instructions (Signed)

## 2013-08-17 ENCOUNTER — Telehealth: Payer: Self-pay | Admitting: *Deleted

## 2013-08-17 NOTE — Telephone Encounter (Signed)
Called pt to follow up on [facility] call on 7/13. He reports the nausea persists, denies emesis. Tolerating PO liquids.  Was told by nurse on call to hold Compazine and take Zofran. Recommended pt alternate antiemetics. He voiced understanding, knows to call if no relief.

## 2013-08-18 ENCOUNTER — Ambulatory Visit (HOSPITAL_BASED_OUTPATIENT_CLINIC_OR_DEPARTMENT_OTHER)

## 2013-08-18 VITALS — BP 138/89 | HR 67 | Temp 98.3°F

## 2013-08-18 DIAGNOSIS — C259 Malignant neoplasm of pancreas, unspecified: Secondary | ICD-10-CM

## 2013-08-18 DIAGNOSIS — C257 Malignant neoplasm of other parts of pancreas: Secondary | ICD-10-CM

## 2013-08-18 MED ORDER — SODIUM CHLORIDE 0.9 % IJ SOLN
10.0000 mL | INTRAMUSCULAR | Status: DC | PRN
  Administered 2013-08-18: 10 mL
  Filled 2013-08-18: qty 10

## 2013-08-18 MED ORDER — HEPARIN SOD (PORK) LOCK FLUSH 100 UNIT/ML IV SOLN
500.0000 [IU] | Freq: Once | INTRAVENOUS | Status: AC | PRN
  Administered 2013-08-18: 500 [IU]
  Filled 2013-08-18: qty 5

## 2013-08-23 ENCOUNTER — Encounter (HOSPITAL_COMMUNITY): Payer: Self-pay | Admitting: Emergency Medicine

## 2013-08-23 ENCOUNTER — Inpatient Hospital Stay (HOSPITAL_COMMUNITY)
Admission: EM | Admit: 2013-08-23 | Discharge: 2013-08-27 | DRG: 391 | Disposition: A | Discharge status: Home / Self Care | Attending: Family Medicine | Admitting: Family Medicine

## 2013-08-23 DIAGNOSIS — K219 Gastro-esophageal reflux disease without esophagitis: Secondary | ICD-10-CM | POA: Diagnosis not present

## 2013-08-23 DIAGNOSIS — T451X5A Adverse effect of antineoplastic and immunosuppressive drugs, initial encounter: Secondary | ICD-10-CM | POA: Diagnosis not present

## 2013-08-23 DIAGNOSIS — Z8509 Personal history of malignant neoplasm of other digestive organs: Secondary | ICD-10-CM

## 2013-08-23 DIAGNOSIS — E860 Dehydration: Secondary | ICD-10-CM

## 2013-08-23 DIAGNOSIS — M109 Gout, unspecified: Secondary | ICD-10-CM | POA: Diagnosis not present

## 2013-08-23 DIAGNOSIS — C259 Malignant neoplasm of pancreas, unspecified: Secondary | ICD-10-CM

## 2013-08-23 DIAGNOSIS — Z7901 Long term (current) use of anticoagulants: Secondary | ICD-10-CM

## 2013-08-23 DIAGNOSIS — IMO0002 Reserved for concepts with insufficient information to code with codable children: Secondary | ICD-10-CM | POA: Diagnosis not present

## 2013-08-23 DIAGNOSIS — E871 Hypo-osmolality and hyponatremia: Secondary | ICD-10-CM | POA: Diagnosis not present

## 2013-08-23 DIAGNOSIS — Z981 Arthrodesis status: Secondary | ICD-10-CM | POA: Diagnosis not present

## 2013-08-23 DIAGNOSIS — R197 Diarrhea, unspecified: Secondary | ICD-10-CM | POA: Diagnosis not present

## 2013-08-23 DIAGNOSIS — R1110 Vomiting, unspecified: Secondary | ICD-10-CM

## 2013-08-23 DIAGNOSIS — R34 Anuria and oliguria: Secondary | ICD-10-CM | POA: Diagnosis not present

## 2013-08-23 DIAGNOSIS — I10 Essential (primary) hypertension: Secondary | ICD-10-CM

## 2013-08-23 DIAGNOSIS — Z66 Do not resuscitate: Secondary | ICD-10-CM | POA: Diagnosis not present

## 2013-08-23 DIAGNOSIS — E43 Unspecified severe protein-calorie malnutrition: Secondary | ICD-10-CM | POA: Diagnosis not present

## 2013-08-23 DIAGNOSIS — D7389 Other diseases of spleen: Secondary | ICD-10-CM | POA: Diagnosis present

## 2013-08-23 DIAGNOSIS — M129 Arthropathy, unspecified: Secondary | ICD-10-CM | POA: Diagnosis present

## 2013-08-23 DIAGNOSIS — F17200 Nicotine dependence, unspecified, uncomplicated: Secondary | ICD-10-CM | POA: Diagnosis not present

## 2013-08-23 DIAGNOSIS — E785 Hyperlipidemia, unspecified: Secondary | ICD-10-CM | POA: Diagnosis not present

## 2013-08-23 DIAGNOSIS — R109 Unspecified abdominal pain: Secondary | ICD-10-CM

## 2013-08-23 DIAGNOSIS — R1115 Cyclical vomiting syndrome unrelated to migraine: Secondary | ICD-10-CM | POA: Diagnosis not present

## 2013-08-23 DIAGNOSIS — Z9221 Personal history of antineoplastic chemotherapy: Secondary | ICD-10-CM | POA: Diagnosis not present

## 2013-08-23 LAB — CBC WITH DIFFERENTIAL/PLATELET
Basophils Absolute: 0 10*3/uL (ref 0.0–0.1)
Basophils Relative: 0 % (ref 0–1)
EOS ABS: 0 10*3/uL (ref 0.0–0.7)
EOS PCT: 0 % (ref 0–5)
HCT: 39.7 % (ref 39.0–52.0)
Hemoglobin: 13.8 g/dL (ref 13.0–17.0)
LYMPHS ABS: 1.8 10*3/uL (ref 0.7–4.0)
LYMPHS PCT: 32 % (ref 12–46)
MCH: 28 pg (ref 26.0–34.0)
MCHC: 34.8 g/dL (ref 30.0–36.0)
MCV: 80.5 fL (ref 78.0–100.0)
Monocytes Absolute: 0.3 10*3/uL (ref 0.1–1.0)
Monocytes Relative: 5 % (ref 3–12)
Neutro Abs: 3.5 10*3/uL (ref 1.7–7.7)
Neutrophils Relative %: 63 % (ref 43–77)
Platelets: 170 10*3/uL (ref 150–400)
RBC: 4.93 MIL/uL (ref 4.22–5.81)
RDW: 13.8 % (ref 11.5–15.5)
WBC: 5.6 10*3/uL (ref 4.0–10.5)

## 2013-08-23 LAB — COMPREHENSIVE METABOLIC PANEL
ALT: 33 U/L (ref 0–53)
ANION GAP: 13 (ref 5–15)
AST: 19 U/L (ref 0–37)
Albumin: 4 g/dL (ref 3.5–5.2)
Alkaline Phosphatase: 93 U/L (ref 39–117)
BUN: 15 mg/dL (ref 6–23)
CO2: 26 meq/L (ref 19–32)
Calcium: 9.6 mg/dL (ref 8.4–10.5)
Chloride: 95 mEq/L — ABNORMAL LOW (ref 96–112)
Creatinine, Ser: 0.93 mg/dL (ref 0.50–1.35)
Glucose, Bld: 111 mg/dL — ABNORMAL HIGH (ref 70–99)
Potassium: 5.2 mEq/L (ref 3.7–5.3)
Sodium: 134 mEq/L — ABNORMAL LOW (ref 137–147)
Total Bilirubin: 1.1 mg/dL (ref 0.3–1.2)
Total Protein: 7.5 g/dL (ref 6.0–8.3)

## 2013-08-23 LAB — LIPASE, BLOOD: Lipase: 9 U/L — ABNORMAL LOW (ref 11–59)

## 2013-08-23 MED ORDER — SODIUM CHLORIDE 0.9 % IV BOLUS (SEPSIS)
1000.0000 mL | Freq: Once | INTRAVENOUS | Status: AC
  Administered 2013-08-24: 1000 mL via INTRAVENOUS

## 2013-08-23 MED ORDER — ONDANSETRON HCL 4 MG/2ML IJ SOLN
4.0000 mg | Freq: Once | INTRAMUSCULAR | Status: AC
  Administered 2013-08-23: 4 mg via INTRAVENOUS
  Filled 2013-08-23: qty 2

## 2013-08-23 MED ORDER — SODIUM CHLORIDE 0.9 % IV BOLUS (SEPSIS)
1000.0000 mL | Freq: Once | INTRAVENOUS | Status: AC
  Administered 2013-08-23: 1000 mL via INTRAVENOUS

## 2013-08-23 MED ORDER — PROMETHAZINE HCL 25 MG/ML IJ SOLN
25.0000 mg | INTRAMUSCULAR | Status: AC | PRN
  Administered 2013-08-24 (×2): 25 mg via INTRAVENOUS
  Filled 2013-08-23 (×2): qty 1

## 2013-08-23 NOTE — ED Notes (Signed)
Pt placed in gown and in bed. Pt monitored by pulse ox, bp cuff, and 5-lead. 

## 2013-08-23 NOTE — ED Notes (Signed)
MD at bedside. 

## 2013-08-23 NOTE — ED Notes (Addendum)
Pt states history of pancreatic cancer. Pt states since Friday he has been in bed, vomiting, unable to keep his regular medications down. Pt worried about dehydration. Pt also states dark urine for the past couple of days as well.

## 2013-08-23 NOTE — ED Notes (Signed)
Family at bedside. 

## 2013-08-23 NOTE — ED Provider Notes (Signed)
CSN: [id number]     Arrival date & time 08/23/13  2036 History   First MD Initiated Contact with Patient 08/23/13 2300     Chief Complaint  Patient presents with  . Emesis  . Dehydration     (Consider location/radiation/quality/duration/timing/severity/associated sxs/prior Treatment) HPI Comments: The patient is a 58 year old male who was recently diagnosed with pancreatic cancer, he is currently undergoing chemotherapy for the last 4 weeks intermittently though he did require admission to the hospital when he became very symptomatic with nausea and vomiting. He returns today after 3 days of gradual onset of progressive, severe nausea and vomiting. He is unable to tolerate any of his pain medications and is unable to tolerate his oral antiemetic. He denies fevers but has had some chills. He has abdominal pain but this is chronic and has not worsened since his nausea started. There is no swelling of the legs, no sore throat or cough or shortness of breath at this time. He endorses having to episodes of diarrhea earlier in the day, there were small amounts, he is passing gas. He denies abdominal distention  Patient is a 58 y.o. male presenting with vomiting. The history is provided by the patient and the spouse.  Emesis   Past Medical History  Diagnosis Date  . Blood transfusion   . GERD (gastroesophageal reflux disease)   . Hyperlipidemia   . Hypertension   . H/O hiatal hernia   . Arthritis     in his back, hips, knees, R shoulder   . Colon polyps     TUBULAR ADENOMA  . Splenic vein thrombosis   . Liver cyst   . Hx of pancreatitis   . Gout   . Pancreatic cancer    Past Surgical History  Procedure Laterality Date  . Hip surgery      x2 right hip- retained hardware ambulates with cane  . Shoulder surgery      right  . Hand surgery      left thumb tendon repair  . Femur hardware removal  2011    R femur- prothesis removed & replaced  . Posterior cervical fusion/foraminotomy  N/A 04/20/2012    Procedure: POSTERIOR CERVICAL FUSION/FORAMINOTOMY LEVEL 1;  Surgeon: Ophelia Charter, MD;  Location: [facility];  Service: Neurosurgery;  Laterality: N/A;  C1/2 laminectomies with posterior screw fixation  . Colonoscopy w/ biopsies  04/15/2011  . Eus N/A 06/10/2013    Procedure: UPPER ENDOSCOPIC ULTRASOUND (EUS) LINEAR;  Surgeon: Milus Banister, MD;  Location: [facility];  Service: Endoscopy;  Laterality: N/A;  . Eus Bilateral 07/15/2013    Procedure: ESOPHAGEAL ENDOSCOPIC ULTRASOUND (EUS) RADIAL;  Surgeon: Milus Banister, MD;  Location: [facility];  Service: Endoscopy;  Laterality: Bilateral;  . Eus N/A 08/05/2013    Procedure: ESOPHAGEAL ENDOSCOPIC ULTRASOUND (EUS) RADIAL;  Surgeon: Milus Banister, MD;  Location: [facility];  Service: Endoscopy;  Laterality: N/A;  . Neurolytic celiac plexus N/A 08/05/2013    Procedure: NEUROLYTIC CELIAC PLEXUS;  Surgeon: Milus Banister, MD;  Location: [facility];  Service: Endoscopy;  Laterality: N/A;   Family History  Problem Relation Age of Onset  . Colon cancer Neg Hx   . Esophageal cancer Neg Hx   . Stomach cancer Neg Hx   . Rectal cancer Neg Hx    History  Substance Use Topics  . Smoking status: Current Every Day Smoker    Types: Cigars  . Smokeless tobacco: Never Used     Comment: one  a day cigar x 6 months. Quit cigarettes -20 yrs ago.  . Alcohol Use: No     Comment: Quit heavy use 30 yrs ago.none now    Review of Systems  Gastrointestinal: Positive for vomiting.  All other systems reviewed and are negative.     Allergies  Review of patient's allergies indicates no known allergies.  Home Medications   Prior to Admission medications   Medication Sig Start Date End Date Taking? Authorizing Provider  amLODipine (NORVASC) 10 MG tablet Take 10 mg by mouth every morning.   Yes Historical Provider, MD  lidocaine-prilocaine (EMLA) cream Apply over port area 1-2 hours prior to treatment and cover with plastic wrap.   DO NOT RUB IN. 07/30/13  Yes Ladell Pier, MD  losartan (COZAAR) 100 MG tablet Take 100 mg by mouth every morning.   Yes Historical Provider, MD  morphine (MS CONTIN) 30 MG 12 hr tablet Take 1 tablet (30 mg total) by mouth every 12 (twelve) hours. 08/06/13  Yes Costin Karlyne Greenspan, MD  omeprazole (PRILOSEC) 40 MG capsule Take 40 mg by mouth daily.   Yes Historical Provider, MD  oxyCODONE-acetaminophen (PERCOCET) 10-325 MG per tablet Take 1-2 tablets by mouth every 4 (four) hours as needed for pain 08/06/13  Yes Costin Karlyne Greenspan, MD  Polyethyl Glycol-Propyl Glycol (SYSTANE) 0.4-0.3 % SOLN Apply 1 drop to eye 3 (three) times daily as needed (dry eyes).   Yes Historical Provider, MD  prochlorperazine (COMPAZINE) 10 MG tablet Take 1 tablet (10 mg total) by mouth every 6 (six) hours as needed for nausea or vomiting. 07/29/13  Yes Ladell Pier, MD  rivaroxaban (XARELTO) 20 MG TABS tablet Take 1 tablet (20 mg total) by mouth every morning. 08/16/13  Yes Ladell Pier, MD  sorbitol 70 % SOLN Take 15 ml - 30 ml by mouth 2 (two) times a days as needed for constipation 07/30/13  Yes Ladell Pier, MD   BP 139/96  Pulse 78  Temp(Src) 99.1 F (37.3 C) (Oral)  Resp 21  Ht 5\' 10"  (1.778 m)  Wt 152 lb 2 oz (69.003 kg)  BMI 21.83 kg/m2  SpO2 99% Physical Exam  Nursing note and vitals reviewed. Constitutional: He appears well-developed and well-nourished. No distress.  HENT:  Head: Normocephalic and atraumatic.  Mouth/Throat: No oropharyngeal exudate.  Mucous membranes are dry  Eyes: Conjunctivae and EOM are normal. Pupils are equal, round, and reactive to light. Right eye exhibits no discharge. Left eye exhibits no discharge. No scleral icterus.  Neck: Normal range of motion. Neck supple. No JVD present. No thyromegaly present.  Cardiovascular: Normal rate, regular rhythm, normal heart sounds and intact distal pulses.  Exam reveals no gallop and no friction rub.   No murmur heard. Pulmonary/Chest:  Effort normal and breath sounds normal. No respiratory distress. He has no wheezes. He has no rales.  Abdominal: Soft. Bowel sounds are normal. He exhibits no distension and no mass. There is tenderness (epigastric tenderness to palpation, no guarding or peritoneal signs).  Musculoskeletal: Normal range of motion. He exhibits no edema and no tenderness.  Lymphadenopathy:    He has no cervical adenopathy.  Neurological: He is alert. Coordination normal.  Skin: Skin is warm and dry. No rash noted. No erythema.  Psychiatric: He has a normal mood and affect. His behavior is normal.    ED Course  Procedures (including critical care time) Labs Review Labs Reviewed  URINALYSIS, ROUTINE W REFLEX MICROSCOPIC - Abnormal; Notable for the  following:    Color, Urine AMBER (*)    Bilirubin Urine SMALL (*)    Ketones, ur 15 (*)    All other components within normal limits  COMPREHENSIVE METABOLIC PANEL - Abnormal; Notable for the following:    Sodium 134 (*)    Chloride 95 (*)    Glucose, Bld 111 (*)    All other components within normal limits  LIPASE, BLOOD - Abnormal; Notable for the following:    Lipase 9 (*)    All other components within normal limits  CBC WITH DIFFERENTIAL    Imaging Review No results found.    MDM   Final diagnoses:  Intractable vomiting with nausea, vomiting of unspecified type  Dehydration    The patient has normal vital signs except for mild hypertension, no fever, no tachycardia, no hypoxia. His laboratory workup is unremarkable the patient does appear dehydrated and by history with the inability to make much urine today or hold down his oral pain medications the patient is uncomfortable and in need of hydration. Suspect chemotherapy-related nausea and vomiting, rule out UTI  Discussed findings with the patient and the  resident physician, he will be admitted to the hospital for ongoing symptomatic control and hydration.   Meds given in ED:  Medications   promethazine (PHENERGAN) injection 25 mg (25 mg Intravenous Given 08/24/13 0242)  sodium chloride 0.9 % bolus 1,000 mL (0 mLs Intravenous Stopped 08/24/13 0541)  sodium chloride 0.9 % bolus 1,000 mL (0 mLs Intravenous Stopped 08/24/13 0357)  ondansetron (ZOFRAN) injection 4 mg (4 mg Intravenous Given 08/23/13 2345)    New Prescriptions   No medications on file      Johnna Acosta, MD 08/24/13 [phone]

## 2013-08-24 ENCOUNTER — Encounter (HOSPITAL_COMMUNITY): Payer: Self-pay | Admitting: General Practice

## 2013-08-24 DIAGNOSIS — I10 Essential (primary) hypertension: Secondary | ICD-10-CM

## 2013-08-24 DIAGNOSIS — Z66 Do not resuscitate: Secondary | ICD-10-CM | POA: Diagnosis present

## 2013-08-24 DIAGNOSIS — Z7901 Long term (current) use of anticoagulants: Secondary | ICD-10-CM | POA: Diagnosis not present

## 2013-08-24 DIAGNOSIS — Z981 Arthrodesis status: Secondary | ICD-10-CM | POA: Diagnosis not present

## 2013-08-24 DIAGNOSIS — Z9221 Personal history of antineoplastic chemotherapy: Secondary | ICD-10-CM | POA: Diagnosis not present

## 2013-08-24 DIAGNOSIS — M109 Gout, unspecified: Secondary | ICD-10-CM | POA: Diagnosis present

## 2013-08-24 DIAGNOSIS — E785 Hyperlipidemia, unspecified: Secondary | ICD-10-CM | POA: Diagnosis present

## 2013-08-24 DIAGNOSIS — T451X5A Adverse effect of antineoplastic and immunosuppressive drugs, initial encounter: Secondary | ICD-10-CM | POA: Diagnosis present

## 2013-08-24 DIAGNOSIS — E860 Dehydration: Secondary | ICD-10-CM

## 2013-08-24 DIAGNOSIS — IMO0002 Reserved for concepts with insufficient information to code with codable children: Secondary | ICD-10-CM | POA: Diagnosis not present

## 2013-08-24 DIAGNOSIS — C259 Malignant neoplasm of pancreas, unspecified: Secondary | ICD-10-CM | POA: Diagnosis present

## 2013-08-24 DIAGNOSIS — R34 Anuria and oliguria: Secondary | ICD-10-CM | POA: Diagnosis present

## 2013-08-24 DIAGNOSIS — E871 Hypo-osmolality and hyponatremia: Secondary | ICD-10-CM | POA: Diagnosis present

## 2013-08-24 DIAGNOSIS — K219 Gastro-esophageal reflux disease without esophagitis: Secondary | ICD-10-CM | POA: Diagnosis present

## 2013-08-24 DIAGNOSIS — R197 Diarrhea, unspecified: Secondary | ICD-10-CM | POA: Diagnosis present

## 2013-08-24 DIAGNOSIS — M129 Arthropathy, unspecified: Secondary | ICD-10-CM | POA: Diagnosis present

## 2013-08-24 DIAGNOSIS — D7389 Other diseases of spleen: Secondary | ICD-10-CM | POA: Diagnosis present

## 2013-08-24 DIAGNOSIS — E43 Unspecified severe protein-calorie malnutrition: Secondary | ICD-10-CM | POA: Diagnosis present

## 2013-08-24 DIAGNOSIS — R1115 Cyclical vomiting syndrome unrelated to migraine: Secondary | ICD-10-CM | POA: Diagnosis present

## 2013-08-24 DIAGNOSIS — F17200 Nicotine dependence, unspecified, uncomplicated: Secondary | ICD-10-CM | POA: Diagnosis present

## 2013-08-24 DIAGNOSIS — Z8509 Personal history of malignant neoplasm of other digestive organs: Secondary | ICD-10-CM | POA: Diagnosis not present

## 2013-08-24 LAB — CBC
HCT: 34.6 % — ABNORMAL LOW (ref 39.0–52.0)
HEMOGLOBIN: 11.7 g/dL — AB (ref 13.0–17.0)
MCH: 27.4 pg (ref 26.0–34.0)
MCHC: 33.8 g/dL (ref 30.0–36.0)
MCV: 81 fL (ref 78.0–100.0)
PLATELETS: 120 10*3/uL — AB (ref 150–400)
RBC: 4.27 MIL/uL (ref 4.22–5.81)
RDW: 13.9 % (ref 11.5–15.5)
WBC: 4.5 10*3/uL (ref 4.0–10.5)

## 2013-08-24 LAB — URINALYSIS, ROUTINE W REFLEX MICROSCOPIC
GLUCOSE, UA: NEGATIVE mg/dL
HGB URINE DIPSTICK: NEGATIVE
Ketones, ur: 15 mg/dL — AB
Leukocytes, UA: NEGATIVE
Nitrite: NEGATIVE
PROTEIN: NEGATIVE mg/dL
Specific Gravity, Urine: 1.025 (ref 1.005–1.030)
UROBILINOGEN UA: 1 mg/dL (ref 0.0–1.0)
pH: 5.5 (ref 5.0–8.0)

## 2013-08-24 LAB — CREATININE, SERUM
CREATININE: 0.91 mg/dL (ref 0.50–1.35)
GFR calc Af Amer: >90 mL/min (ref >90)
GFR calc non Af Amer: >90 mL/min (ref >90)

## 2013-08-24 MED ORDER — ENOXAPARIN SODIUM 40 MG/0.4ML ~~LOC~~ SOLN: 40.0000 mg | SUBCUTANEOUS | Status: DC

## 2013-08-24 MED ORDER — DEXAMETHASONE SODIUM PHOSPHATE 4 MG/ML IJ SOLN
10.0000 mg | Freq: Two times a day (BID) | INTRAMUSCULAR | Status: DC
  Administered 2013-08-24 – 2013-08-25 (×3): 10 mg via INTRAVENOUS
  Filled 2013-08-24 (×6): qty 2.5

## 2013-08-24 MED ORDER — MORPHINE SULFATE 2 MG/ML IJ SOLN
2.0000 mg | INTRAMUSCULAR | Status: DC | PRN
  Administered 2013-08-24: 2 mg via INTRAVENOUS
  Filled 2013-08-24: qty 1

## 2013-08-24 MED ORDER — SODIUM CHLORIDE 0.45 % IV SOLN
INTRAVENOUS | Status: DC
  Administered 2013-08-24: 08:00:00 via INTRAVENOUS

## 2013-08-24 MED ORDER — SODIUM CHLORIDE 0.9 % IV BOLUS (SEPSIS)
1000.0000 mL | Freq: Once | INTRAVENOUS | Status: AC
  Administered 2013-08-24: 1000 mL via INTRAVENOUS

## 2013-08-24 MED ORDER — AMLODIPINE BESYLATE 10 MG PO TABS
10.0000 mg | ORAL_TABLET | Freq: Every morning | ORAL | Status: DC
  Administered 2013-08-25 – 2013-08-27 (×3): 10 mg via ORAL
  Filled 2013-08-24 (×4): qty 1
  Filled 2013-08-24: qty 2

## 2013-08-24 MED ORDER — HYDROMORPHONE HCL PF 1 MG/ML IJ SOLN
1.0000 mg | INTRAMUSCULAR | Status: DC | PRN
  Administered 2013-08-24 – 2013-08-25 (×7): 1 mg via INTRAVENOUS
  Filled 2013-08-24 (×7): qty 1

## 2013-08-24 MED ORDER — RIVAROXABAN 20 MG PO TABS
20.0000 mg | ORAL_TABLET | Freq: Every morning | ORAL | Status: DC
  Administered 2013-08-25 – 2013-08-27 (×3): 20 mg via ORAL
  Filled 2013-08-24 (×4): qty 1

## 2013-08-24 MED ORDER — PROCHLORPERAZINE EDISYLATE 5 MG/ML IJ SOLN
10.0000 mg | Freq: Four times a day (QID) | INTRAMUSCULAR | Status: DC | PRN
  Filled 2013-08-24: qty 2

## 2013-08-24 MED ORDER — DEXTROSE-NACL 5-0.9 % IV SOLN: INTRAVENOUS | Status: DC

## 2013-08-24 MED ORDER — POLYVINYL ALCOHOL 1.4 % OP SOLN
1.0000 [drp] | Freq: Three times a day (TID) | OPHTHALMIC | Status: DC | PRN
  Filled 2013-08-24: qty 15

## 2013-08-24 MED ORDER — PANTOPRAZOLE SODIUM 40 MG PO TBEC: 80.0000 mg | DELAYED_RELEASE_TABLET | Freq: Every day | ORAL | Status: DC

## 2013-08-24 MED ORDER — DEXTROSE-NACL 5-0.45 % IV SOLN
INTRAVENOUS | Status: DC
  Administered 2013-08-24 (×2): via INTRAVENOUS

## 2013-08-24 MED ORDER — PROCHLORPERAZINE MALEATE 10 MG PO TABS
10.0000 mg | ORAL_TABLET | Freq: Four times a day (QID) | ORAL | Status: DC | PRN
  Filled 2013-08-24: qty 1

## 2013-08-24 MED ORDER — PANTOPRAZOLE SODIUM 40 MG IV SOLR
40.0000 mg | Freq: Two times a day (BID) | INTRAVENOUS | Status: DC
  Administered 2013-08-24 – 2013-08-25 (×4): 40 mg via INTRAVENOUS
  Filled 2013-08-24 (×6): qty 40

## 2013-08-24 MED ORDER — ONDANSETRON HCL 4 MG PO TABS: 4.0000 mg | ORAL_TABLET | Freq: Four times a day (QID) | ORAL | Status: DC | PRN

## 2013-08-24 MED ORDER — ONDANSETRON HCL 4 MG/2ML IJ SOLN: 4.0000 mg | Freq: Four times a day (QID) | INTRAMUSCULAR | Status: DC | PRN

## 2013-08-24 MED ORDER — LOSARTAN POTASSIUM 50 MG PO TABS
100.0000 mg | ORAL_TABLET | Freq: Every morning | ORAL | Status: DC
  Administered 2013-08-25 – 2013-08-27 (×3): 100 mg via ORAL
  Filled 2013-08-24 (×4): qty 2

## 2013-08-24 MED ORDER — POLYETHYL GLYCOL-PROPYL GLYCOL 0.4-0.3 % OP SOLN: 1.0000 [drp] | Freq: Three times a day (TID) | OPHTHALMIC | Status: DC | PRN

## 2013-08-24 NOTE — H&P (Signed)
[facility] Attending Note  I personally saw and evaluated the patient. The plan of care was discussed with the resident team. I agree with the assessment and plan as documented by the resident.   58 year old [place] American male with past medical history of pancreatic cancer, hypertension, and splenic vein thrombosis presents with intractable nausea and vomiting status post recent chemotherapy treatment. Patient has had similar response to chemotherapy in the past. He has not been able to keep down food or liquids for the past 4 days, he also currently has significant left upper quadrant abdominal pain which she rates as 7/10, the pain radiates to the left flank into his back, please refer to resident note for additional history of present illness.  Vitals: Reviewed General: Pleasant African American male, mild distress secondary to abdominal pain HEENT: Normocephalic, pupils equal in size bilaterally, no scleral icterus, nasal septum midline, dry mucous membranes, uvula midline, neck was supple, no anterior posterior cervical lymphadenopathy Cardiac: Regular in rhythm, S1 and S2 present, no murmurs, no heaves or thrill Respiratory: Clear patient bilaterally, normal effort Abdomen: Soft, left upper quadrant tenderness, decreased bowel sounds Extremity is: No edema, 2+ radial and dorsalis pedis pulses Skin: No rash  Reviewed all lab work  Assessment and plan: 58 year old male with past medical history of pancreatic cancer admitted with intractable nausea and vomiting status post chemotherapy 1. Intractable nausea and vomiting-agree with IV fluid resuscitation and antibiotics, resident team to contact patient's oncologist 2. Abdominal pain-secondary to pancreatic cancer, agree with IV Dilaudid while n.p.o., Transition to orals once taking diet 3. Hypertension-currently within acceptable limits, restart home medications once taking by mouth  Dossie Arbour M.D.

## 2013-08-24 NOTE — ED Notes (Signed)
Family at bedside. 

## 2013-08-24 NOTE — H&P (Signed)
[facility] Admission History and Physical Service Pager: [phone]  Patient name: Ray [name] Medical record number: [id number] Date of birth: 1955-03-09 Age: 58 y.o. Gender: male  Primary Care Provider: Chrisandra Netters, MD Consultants: None Code Status: DNR, confirmed with patient on admission  Chief Complaint: Intractable N/V  Assessment and Plan: Ray [name] is a 58 y.o. male presenting with Intractable N/V with dehydration. PMH is significant for Pancreatic cancer, HTN, and splenic vein thrombosis.  Intractable N/V 2/2 Pancreatic cancer: Most likely related to recent chemo treatment, as it started the day after last chemo treatment. Chemotherapy known emesis inducing agent and folfirinox (given component of irinotecan) is particular high risk given mechanism of action. No signs of neutropenia at this time. Diarrhea appears to be controlled at this time. Would not proceed with further rounds of chemo until diarrhea stabilized for at least 24 hr period. Possible need for dose reduction vs alternative agent. Could consider gemcitabine as single agent although not as efficacious in head to head study (ACCORD 11 trial). Will consult with onc. May also consider palliative discussion at this juncture to facilitate difficult decision making. Unlikely to be due to infection: no leukocytosis, and aebrile (T 99.1). Admitted 7/1 for intractable abdominal pain and n/v - had celiac plexus neurolysis at that time  Followed by Dr. Ardis Hughs (GI) as outpatient. - Continue home compazine (IV instead of PO) - Zofran and phenergan prn - Will keep NPO and ADAT when can tolerate PO - Pt is borderline febrile (T 99.1).  If spikes a fever, would have low threshold to get blood cultures. - vitals per floor protocol  Dehydration 2/2 intractable N/V: Was oliguric on presentation to ED, and only made small amount of urine after 2-1L NS boluses.  Urine mildly concentrated with Spec  gravity 1.025 and ketones present. Cr preserved at 0.93 - Monitor strict Is & Os - mIVF - Consider bladder scan if does not continue to make urine  Hyponatremia hypochloremia: Na 134 on admission.  Likely hypovolemic hyponatremia, given severe dehydration. - Will continue to monitor and give mIVF (may need NS vs 1/2NS) -will trend daily BMETs for improvement  Pancreatic Cancer: Followed by Dr. Learta Codding. Started chemo ~4 wks ago.  Last chemo on Wednesday. Not currently neutropenic ANC 3.5. 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 - Continue home Norvasc and Losartan - Continue to monitor  FEN/GI: NPO - ADAT, D5 1/2 NS @ 223mL/hr Prophylaxis: home Xarelto, Lovenox  Disposition: Place in observation for rehydration and control of N/V.  Dispo pending improvement in N/V and ability to hydrate PO  History of Present Illness: Ray [name] is a 58 y.o. male presenting with Intractable N/V with dehydration. PMH is significant for newly diagnosed Pancreatic cancer, HTN, and splenic vein thrombosis.  Started having N/V Thursday after last dose of chemo on Wednesday.  Pt is unsure if they have cut back on chemo dosage.  Has not been able to keep anything down PO since Thursday.  Loose BM on Friday. Vomiting non-bilious, non-bloody stomach contents.  Feeling lightheaded, especially when rising from sitting.  Denies CP, palpitations.  Also unable to keep any of his PO narcotics down 2/2 vomiting.  Reports fever x1 day over the weekend.  In the ED: 1L NS bolus x2, Zofran, Phenergan. Finally made a small amount of urine after 2L of  bolus.  Unable to keep any PO liquid down even after Zofran and Phenergan.  Review Of Systems: Per HPI with the following additions: None Otherwise 12 point review of systems was performed and was unremarkable.  Patient Active Problem List   Diagnosis Date Noted  . Intractable abdominal pain 08/03/2013  . Dehydration 08/03/2013  . Abdominal discomfort 08/03/2013  . Pancreatic cancer 08/03/2013  . Malignant neoplasm of pancreas, part unspecified 07/30/2013  . Hematuria 06/01/2013  . Anticoagulated 06/01/2013  . 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 Unspecified constipation 04/21/2013  . GERD (gastroesophageal reflux disease) 04/21/2013  . Chronic postoperative pain 04/14/2012  . Spinal stenosis in cervical region 04/14/2012  . Chronic left sacroiliac joint pain 04/14/2012  . Right lumbar radiculitis 04/14/2012  . Abnormal MRI, lumbar spine 02/17/2012  . Low back pain 12/27/2011  . 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 Overweight 01/21/2011  . Right leg pain 10/21/2010  . HYPERGLYCEMIA, FASTING 01/22/2010  . UNSPECIFIED ANEMIA 07/05/2008  . HIP FRACTURE, RIGHT 07/05/2008  . HEMARTHROSIS 11/11/2007  . GANGLION, TENDON SHEATH 09/30/2006  . Gout 04/03/2006  . HYPERTENSION, BENIGN SYSTEMIC 04/03/2006  . RAYNAUDS SYNDROME 04/03/2006  . REFLUX ESOPHAGITIS 04/03/2006   Past Medical History: Past Medical History  Diagnosis Date  . Blood transfusion   . GERD (gastroesophageal reflux disease)   . Hyperlipidemia   . Hypertension   . H/O hiatal hernia   . Arthritis     in his back, hips, knees, R shoulder   . Colon polyps     TUBULAR ADENOMA  . Splenic vein thrombosis   . Liver cyst   . Hx of pancreatitis   . Gout   . Pancreatic cancer    Past Surgical History: Past Surgical History  Procedure Laterality Date  . Hip surgery      x2 right hip- retained hardware ambulates with cane  . Shoulder surgery      right  . Hand surgery      left thumb tendon  repair  . Femur hardware removal  2011    R femur- prothesis removed & replaced  . Posterior cervical fusion/foraminotomy N/A 04/20/2012    Procedure: POSTERIOR CERVICAL FUSION/FORAMINOTOMY LEVEL 1;  Surgeon: Ophelia Charter, MD;  Location: [facility];  Service: Neurosurgery;  Laterality: N/A;  C1/2 laminectomies with posterior screw fixation  . Colonoscopy w/ biopsies  04/15/2011  . Eus N/A 06/10/2013    Procedure: UPPER ENDOSCOPIC ULTRASOUND (EUS) LINEAR;  Surgeon: Milus Banister, MD;  Location: [facility];  Service: Endoscopy;  Laterality: N/A;  . Eus Bilateral 07/15/2013    Procedure: ESOPHAGEAL ENDOSCOPIC ULTRASOUND (EUS) RADIAL;  Surgeon: Milus Banister, MD;  Location: [facility];  Service: Endoscopy;  Laterality: Bilateral;  . Eus N/A 08/05/2013    Procedure: ESOPHAGEAL ENDOSCOPIC ULTRASOUND (EUS) RADIAL;  Surgeon: Milus Banister, MD;  Location: [facility];  Service: Endoscopy;  Laterality: N/A;  . Neurolytic celiac plexus N/A 08/05/2013    Procedure: NEUROLYTIC CELIAC PLEXUS;  Surgeon: Milus Banister, MD;  Location: [facility];  Service: Endoscopy;  Laterality: N/A;   Social History: History  Substance Use Topics  . Smoking status: Current Every Day Smoker    Types: Cigars  .  Smokeless tobacco: Never Used     Comment: one a day cigar x 6 months. Quit cigarettes -20 yrs ago.  . Alcohol Use: No     Comment: Quit heavy use 30 yrs ago.none now   Additional social history: None  Please also refer to relevant sections of EMR.  Family History: Family History  Problem Relation Age of Onset  . Colon cancer Neg Hx   . Esophageal cancer Neg Hx   . Stomach cancer Neg Hx   . Rectal cancer Neg Hx    Allergies and Medications: No Known Allergies No current facility-administered medications on file prior to encounter.   Current Outpatient Prescriptions on File Prior to Encounter  Medication Sig Dispense Refill  . amLODipine (NORVASC) 10 MG tablet Take 10 mg by mouth every  morning.      . lidocaine-prilocaine (EMLA) cream Apply over port area 1-2 hours prior to treatment and cover with plastic wrap.  DO NOT RUB IN.  30 g  PRN  . losartan (COZAAR) 100 MG tablet Take 100 mg by mouth every morning.      Marland Kitchen morphine (MS CONTIN) 30 MG 12 hr tablet Take 1 tablet (30 mg total) by mouth every 12 (twelve) hours.  60 tablet  0  . omeprazole (PRILOSEC) 40 MG capsule Take 40 mg by mouth daily.      Marland Kitchen oxyCODONE-acetaminophen (PERCOCET) 10-325 MG per tablet Take 1-2 tablets by mouth every 4 (four) hours as needed for pain  100 tablet  0  . Polyethyl Glycol-Propyl Glycol (SYSTANE) 0.4-0.3 % SOLN Apply 1 drop to eye 3 (three) times daily as needed (dry eyes).      . prochlorperazine (COMPAZINE) 10 MG tablet Take 1 tablet (10 mg total) by mouth every 6 (six) hours as needed for nausea or vomiting.  40 tablet  1  . rivaroxaban (XARELTO) 20 MG TABS tablet Take 1 tablet (20 mg total) by mouth every morning.  90 tablet  2  . sorbitol 70 % SOLN Take 15 ml - 30 ml by mouth 2 (two) times a days as needed for constipation  1000 mL  1    Objective: BP 139/96  Pulse 78  Temp(Src) 99.1 F (37.3 C) (Oral)  Resp 21  Ht $R'5\' 10"'tC$  (1.778 m)  Wt 152 lb 2 oz (69.003 kg)  BMI 21.83 kg/m2  SpO2 99% Exam: General: Ill-appearing AA man reclining in bed in NAD, mild temporal wasting noted b/l HEENT: Dry MM, no oral lesions, PERRL, EOMI no palpable lymphadenopathy Cardiovascular: RRR, no m/r/g. 2+ DP pulses b/l Respiratory: CTAB, no w/r/c Abdomen: Soft, ND, hypoactive BS Extremities: No LE edema, no calf tenderness Skin: No rashes noted Neuro: Grossly intact, no focal deficits, moves all extremities equally, A&Ox3.  Labs and Imaging: CBC BMET   Recent Labs Lab 08/23/13 2126  WBC 5.6  HGB 13.8  HCT 39.7  PLT 170    Recent Labs Lab 08/23/13 2126  NA 134*  K 5.2  CL 95*  CO2 26  BUN 15  CREATININE 0.93  GLUCOSE 111*  CALCIUM 9.6      Urinalysis    Component Value Date/Time    COLORURINE AMBER* 08/24/2013 0455   APPEARANCEUR CLEAR 08/24/2013 0455   LABSPEC 1.025 08/24/2013 0455   PHURINE 5.5 08/24/2013 0455   GLUCOSEU NEGATIVE 08/24/2013 0455   HGBUR NEGATIVE 08/24/2013 0455   BILIRUBINUR SMALL* 08/24/2013 0455   BILIRUBINUR SMALL 06/01/2013 1000   KETONESUR 15* 08/24/2013 0455   PROTEINUR NEGATIVE  08/24/2013 0455   PROTEINUR 100 06/01/2013 1000   UROBILINOGEN 1.0 08/24/2013 0455   UROBILINOGEN 1.0 06/01/2013 1000   NITRITE NEGATIVE 08/24/2013 0455   NITRITE POSITIVE 06/01/2013 1000   LEUKOCYTESUR NEGATIVE 08/24/2013 0455    Lipase     Component Value Date/Time   LIPASE 9* 08/23/2013 2126    Hepatic Function Latest Ref Rng 08/23/2013 08/16/2013 08/03/2013  Total Protein 6.0 - 8.3 g/dL 7.5 7.1 7.6  Albumin 3.5 - 5.2 g/dL 4.0 3.8 4.1  AST 0 - 37 U/L $Remo'19 14 15  'mNkQS$ ALT 0 - 53 U/L 33 22 18  Alk Phosphatase 39 - 117 U/L 93 93 92  Total Bilirubin 0.3 - 1.2 mg/dL 1.1 0.64 0.6      Lavon Paganini, MD 08/24/2013, 5:46 AM PGY-1, [place] Intern pager: [phone], text pages welcome  I personally saw and examined the patient with excellent PGY-1 Lavon Paganini. I agree with above documentation with the following exceptions in blue.  Bernadene Bell, MD PGY-1

## 2013-08-24 NOTE — Progress Notes (Signed)
Family Medicine Teaching Service PCP Social Visit  I stopped by [name]'s room this evening to visit with him and his wife, Ray [name], who is also my patient. The three of [place] chatted for a little bit. They expressed their hope for finding an alternative chemotherapy regimen which may cause less nausea and vomiting. He voiced his fear of trying anything by mouth, due to the pain and misery it causes him after he inevitably vomits and then continues to dry heave. We talked about the importance of going slowly with taking foods and liquids by mouth, and that it is best to start with small sips of clear liquids.  I am unsure of exactly what Ray [name] and Ray [name] understand of his prognosis and the fact that, ultimately, he will most likely die from this disease. I did not attempt to discuss this with them today, as I see that pt's oncologist [name] will be seeing him tomorrow. Will await Dr. Gearldine Shown input on what alternative regimens or options there are for treating his pancreatic cancer.  I greatly appreciate the excellent care provided by the family medicine and oncology teams for [name]. I would consider it a privilege to participate in any goals of care discussions that may take place during this hospitalization. Please let me know how I can be helpful in caring for Ray [name] and his family.   Chrisandra Netters, MD Family Medicine PGY-3  Personal Pager [phone]

## 2013-08-24 NOTE — ED Notes (Signed)
PT asked to provide urine sample. Pt states that he cannot at this time. Pt asked to inform staff when he must urinate.

## 2013-08-25 DIAGNOSIS — R109 Unspecified abdominal pain: Secondary | ICD-10-CM

## 2013-08-25 DIAGNOSIS — E43 Unspecified severe protein-calorie malnutrition: Secondary | ICD-10-CM | POA: Insufficient documentation

## 2013-08-25 DIAGNOSIS — R1115 Cyclical vomiting syndrome unrelated to migraine: Principal | ICD-10-CM

## 2013-08-25 DIAGNOSIS — C169 Malignant neoplasm of stomach, unspecified: Secondary | ICD-10-CM

## 2013-08-25 DIAGNOSIS — I10 Essential (primary) hypertension: Secondary | ICD-10-CM

## 2013-08-25 DIAGNOSIS — D7389 Other diseases of spleen: Secondary | ICD-10-CM

## 2013-08-25 LAB — BASIC METABOLIC PANEL
Anion gap: 15 (ref 5–15)
BUN: 14 mg/dL (ref 6–23)
CO2: 23 mEq/L (ref 19–32)
Calcium: 8.9 mg/dL (ref 8.4–10.5)
Chloride: 96 mEq/L (ref 96–112)
Creatinine, Ser: 0.82 mg/dL (ref 0.50–1.35)
GFR calc Af Amer: >90 mL/min (ref >90)
Glucose, Bld: 118 mg/dL — ABNORMAL HIGH (ref 70–99)
POTASSIUM: 4.8 meq/L (ref 3.7–5.3)
Sodium: 134 mEq/L — ABNORMAL LOW (ref 137–147)

## 2013-08-25 LAB — CBC WITH DIFFERENTIAL/PLATELET
Basophils Absolute: 0 10*3/uL (ref 0.0–0.1)
Basophils Relative: 0 % (ref 0–1)
Eosinophils Absolute: 0 10*3/uL (ref 0.0–0.7)
Eosinophils Relative: 0 % (ref 0–5)
HEMATOCRIT: 33.3 % — AB (ref 39.0–52.0)
Hemoglobin: 11.5 g/dL — ABNORMAL LOW (ref 13.0–17.0)
LYMPHS ABS: 0.7 10*3/uL (ref 0.7–4.0)
Lymphocytes Relative: 20 % (ref 12–46)
MCH: 28.2 pg (ref 26.0–34.0)
MCHC: 34.5 g/dL (ref 30.0–36.0)
MCV: 81.6 fL (ref 78.0–100.0)
MONO ABS: 0.1 10*3/uL (ref 0.1–1.0)
Monocytes Relative: 3 % (ref 3–12)
NEUTROS PCT: 77 % (ref 43–77)
Neutro Abs: 2.7 10*3/uL (ref 1.7–7.7)
Platelets: 129 10*3/uL — ABNORMAL LOW (ref 150–400)
RBC: 4.08 MIL/uL — AB (ref 4.22–5.81)
RDW: 13.9 % (ref 11.5–15.5)
WBC: 3.5 10*3/uL — ABNORMAL LOW (ref 4.0–10.5)

## 2013-08-25 MED ORDER — MORPHINE SULFATE ER 30 MG PO TBCR
30.0000 mg | EXTENDED_RELEASE_TABLET | Freq: Two times a day (BID) | ORAL | Status: DC
Start: 2013-08-25 — End: 2013-08-27
  Administered 2013-08-25 – 2013-08-27 (×4): 30 mg via ORAL
  Filled 2013-08-25 (×4): qty 2

## 2013-08-25 MED ORDER — BOOST / RESOURCE BREEZE PO LIQD
1.0000 | Freq: Three times a day (TID) | ORAL | Status: DC
  Administered 2013-08-25: 1 via ORAL

## 2013-08-25 MED ORDER — HYDROMORPHONE HCL PF 1 MG/ML IJ SOLN
2.0000 mg | INTRAMUSCULAR | Status: DC | PRN
  Administered 2013-08-25 (×4): 2 mg via INTRAVENOUS
  Administered 2013-08-25: 1 mg via INTRAVENOUS
  Administered 2013-08-25 – 2013-08-26 (×4): 2 mg via INTRAVENOUS
  Filled 2013-08-25 (×9): qty 2

## 2013-08-25 MED ORDER — OXYCODONE-ACETAMINOPHEN 5-325 MG PO TABS
1.0000 | ORAL_TABLET | ORAL | Status: DC | PRN
  Administered 2013-08-25 – 2013-08-27 (×8): 2 via ORAL
  Filled 2013-08-25 (×8): qty 2

## 2013-08-25 MED ORDER — DEXAMETHASONE 4 MG PO TABS
8.0000 mg | ORAL_TABLET | Freq: Two times a day (BID) | ORAL | Status: DC
  Administered 2013-08-25: 8 mg via ORAL
  Filled 2013-08-25 (×3): qty 2

## 2013-08-25 NOTE — Progress Notes (Signed)
Family Medicine Teaching Service PCP Social Visit  Stopped by [facility] room again tonight to visit with him and Ray [name]. He is feeling much better today and has tolerated liquids and some soft foods today. Planning to eat pork for dinner. Hopeful for going home tomorrow. [name] visited with him and advised that he will be able to go home with decadron for the nausea, which he is very happy with since it has helped him so much.  After our visit, Ray [name] walked me to the elevator and shared with me some concerns she has about Ray [name]'s memory. She has noticed him not remembering things, or saying that certain things happened when they did not actually happen. This has only occurred when he is receiving pain medications, never any other time. I advised that high dose pain medicines and steroids can certainly cause confusion and memory issues. Encouraged her to stay attuned to these concerns and if they persist, to share them with [name].  I continue to be grateful for all of the care Ray [name] has received from the [facility] and [name]. I am available by pager if there is any way in which I can be helpful in [facility] care.  Ray Netters, MD Family Medicine PGY-3  Personal Pager [phone]

## 2013-08-25 NOTE — Progress Notes (Signed)
Pt ate 100% of pm meal.  Denies nausea.  Tolerated well.

## 2013-08-25 NOTE — Progress Notes (Signed)
IP PROGRESS NOTE  Subjective:   [name] completed a second cycle of FOLFIRINOX beginning on 08/16/2013. He reports developing nausea and vomiting beginning on 08/20/2013. This persisted and he presented to the emergency room 08/23/2013. He reports diarrhea for a few days following chemotherapy. This has resolved. His abdominal pain has improved. The nausea is better since he was placed on Decadron yesterday. Zofran and Compazine did not help the nausea at home.  Objective: Vital signs in last 24 hours: Blood pressure 143/99, pulse 68, temperature 98.2 F (36.8 C), temperature source Oral, resp. rate 16, height 5\' 10"  (1.778 m), weight 167 lb 1.6 oz (75.796 kg), SpO2 98.00%.  Intake/Output from previous day: 07/21 0701 - 07/22 0700 In: 2655 [I.V.:2655] Out: 1100 [Urine:1100]  Physical Exam:  HEENT: No thrush Lungs: Clear bilaterally Cardiac: Regular rate and rhythm Abdomen: Soft and nontender Extremities: No leg edema   Portacath/PICC-without erythema  Lab Results:  Recent Labs  08/24/13 0817 08/25/13 0531  WBC 4.5 3.5*  HGB 11.7* 11.5*  HCT 34.6* 33.3*  PLT 120* 129*    BMET  Recent Labs  08/23/13 2126 08/24/13 0817 08/25/13 0531  NA 134*  --  134*  K 5.2  --  4.8  CL 95*  --  96  CO2 26  --  23  GLUCOSE 111*  --  118*  BUN 15  --  14  CREATININE 0.93 0.91 0.82  CALCIUM 9.6  --  8.9    Studies/Results: No results found.  Medications: I have reviewed the patient's current medications.  Assessment/Plan:  1. Pancreas cancer. 07/06/2013 ,CA 19-9- 2067.  CT abdomen/pelvis 07/09/2013 with interval development of amorphous soft tissue circumferentially encasing the celiac axis and tracking caudally down toward the SMA. Associated stable irregular mild distention of the main pancreatic duct in the body and tail of the pancreas with no discrete mass lesion seen. 2.2 x 1.4 cm portacaval lymph node in the hepatoduodenal ligament had increased in size from 1.3 x  1.1 cm previously. Other scattered small lymph nodes seen in the hepatoduodenal ligament.  Endoscopic ultrasound 07/15/2013 with a very vague masslike lesion in the region of the uncinate pancreas. Fine needle aspiration showed atypical cells consistent with adenocarcinoma.  PET scan 07/29/2013 with hypermetabolic soft tissue posterior to the pancreas with an adjacent hypermetabolic lymph node, no hypermetabolic pancreas mass, no evidence of distant metastatic disease  Cycle 1 FOLFIRINOX 08/02/2013 Cycle 2 the FOLFIRINOX 08/16/2013 2. Abdominal pain secondary to #1. 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 Hypertension. 4. History of gout. 5. Admission 08/24/2013 with nausea and vomiting-he appears to have delayed nausea secondary 6. Splenic vein thrombosis-maintained on xarelto   Mr. Cafiero was admitted yesterday with intractable nausea and vomiting following cycle 2 FOLFIRINOX. This was despite a dose reduction of the irinotecan and adjustment of the anti-emetic regimen with cycle 2. The nausea is better today with Decadron.  Recommendations: 1. Continue Decadron-switch to an oral regimen 2. Advance diet as tolerated 3. Resume MS Contin and oxycodone for pain 4. He can be discharged to home 08/26/2013 if he is tolerating a diet.  Please call oncology as needed. I will check on him 08/26/2013.   LOS: 2 days   Debrina Kizer, Dominica Severin  08/25/2013, 10:37 AM

## 2013-08-25 NOTE — Progress Notes (Signed)
Pt states the current pain medication is not lasting very long, that at this time the previous administration did not take his pain away at all.  Family Medicine on call MD paged, new order given.

## 2013-08-25 NOTE — Progress Notes (Signed)
FMTS Attending Daily Note:  Ray Sabal MD  [phone] pager  Family Practice pager:  [phone] I have seen and examined this patient and have reviewed their chart. I have discussed this patient with the resident. I agree with the resident's findings, assessment and care plan.  Additionally:  - Patient greatly improved from nausea and pain standpoint.  - Wants to attempt diet.  Start clears and advance slowly.  [name], MD 08/25/2013 1:36 PM

## 2013-08-25 NOTE — Progress Notes (Signed)
Family Medicine Teaching Service Daily Progress Note Intern Pager: [phone]  Patient name: Ray [name] Medical record number: [id number] Date of birth: 03-Jul-1955 Age: 58 y.o. Gender: male  Primary Care Provider: Chrisandra Netters, MD Consultants: Oncology Code Status: DNR  Pt Overview and Major Events to Date:  7/21 Admitted for intractable N/V and rehydration, discussed pt with Onc [name]  Assessment and Plan:  Ray [name] is a 58 y.o. male presenting with Intractable N/V with dehydration. PMH is significant for Pancreatic cancer, HTN, and splenic vein thrombosis.   Intractable N/V 2/2 Chemotherapy for Pancreatic cancer:  Improving. Started day after last round of chemo.  Will discuss alternatives to chemo regimen with [name]. - Continue IV compazine, Zofran and phenergan prn - Continue Decadron 10 q12h - Advance diet to clear liquids today  - Pt afebrile. If spikes a fever, would have low threshold to get blood cultures.  - [name] to see him today, appreciate recs  Dehydration 2/2 intractable N/V: Improving. Was oliguric on presentation to ED, and only made small amount of urine after 2-1L NS boluses. Now s/p 3 - 1l boluses. - Cr 0.82, UOP 1100 (0.6 mL/kg/hr) O/N - Continue to monitor strict Is & Os  - mIVF - will d/c these when he is tolerating PO.    Hyponatremia: Stable. Na 134. Likely hypovolemic hyponatremia, given severe dehydration.  - Will continue to monitor and give mIVF - will trend daily BMETs for improvement   Pancreatic Cancer: Started chemo ~4 wks ago. Last chemo on Wednesday. Not currently neutropenic. ANC 3.5. 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.  [name] to see him  today.  HTN: BP stable.  - Continue home Norvasc and Losartan  - Continue to monitor   FEN/GI: Clear liquids, D5 1/2 NS @ 131mL/hr Prophylaxis: home Xarelto  Disposition: Pending ability to tolerate PO hydration and improvement in N/V  Subjective:  He reports that N/V have improved after starting decadron.  He would like to try clear liquids this AM and maybe some jello later in the day.  His pain is now well controlled on Dilaudid.  Objective: Temp:  [98.2 F (36.8 C)-98.5 F (36.9 C)] 98.2 F (36.8 C) (07/22 0557) Pulse Rate:  [56-68] 68 (07/22 0557) Resp:  [16] 16 (07/22 0557) BP: (139-152)/(84-99) 143/99 mmHg (07/22 0557) SpO2:  [98 %-100 %] 98 % (07/22 0557) Physical Exam: General: Ill-appearing AA man laying in bed in NAD, mild temporal wasting noted b/l  HEENT: MMM, no oral lesions, no palpable LAD  Cardiovascular: RRR, no m/r/g.  Respiratory: CTAB, no w/r/c  Abdomen: Soft, ND, NABS  Extremities: No LE edema, no calf tenderness  Neuro: A&Ox3.   Laboratory:  Recent Labs Lab 08/23/13 2126 08/24/13 0817 08/25/13 0531  WBC 5.6 4.5 3.5*  HGB 13.8 11.7* 11.5*  HCT 39.7 34.6* 33.3*  PLT 170 120* 129*    Recent Labs Lab 08/23/13 2126 08/24/13 0817 08/25/13 0531  NA 134*  --  134*  K 5.2  --  4.8  CL 95*  --  96  CO2 26  --  23  BUN 15  --  14  CREATININE 0.93 0.91 0.82  CALCIUM 9.6  --  8.9  PROT 7.5  --   --   BILITOT 1.1  --   --   ALKPHOS 93  --   --   ALT 33  --   --   AST 19  --   --   GLUCOSE 111*  --  118*   Lipase     Component Value Date/Time   LIPASE 9* 08/23/2013 2126    Hepatic Function Latest Ref Rng 08/23/2013 08/16/2013 08/03/2013  Total Protein 6.0 - 8.3 g/dL 7.5 7.1 7.6  Albumin 3.5 - 5.2 g/dL 4.0 3.8 4.1  AST 0 - 37 U/L $Remo'19 14 15  'COoHU$ ALT 0 - 53 U/L 33 22 18  Alk Phosphatase 39 - 117 U/L 93 93 92  Total Bilirubin 0.3 - 1.2 mg/dL 1.1 0.64 0.6     Lavon Paganini, MD 08/25/2013, 8:56 AM PGY-1, [place]  Intern pager: [phone], text pages welcome

## 2013-08-25 NOTE — Progress Notes (Signed)
INITIAL NUTRITION ASSESSMENT  DOCUMENTATION CODES Per approved criteria  -Severe malnutrition in the context of chronic illness  Pt meets criteria for severe MALNUTRITION in the context of chronic illness as evidenced by wt loss of 26% in 2 months and severe fat and muscle wasting.  INTERVENTION: Resource Breeze po TID, each supplement provides 250 kcal and 9 grams of protein  NUTRITION DIAGNOSIS: Inadequate oral intake related to newly diagnosed pancreatic cancer as evidenced by wt loss.   Goal: Pt to meet >/= 90% of their estimated nutrition needs   Monitor:  Wt trends, po intake, acceptance of supplements, labs  Reason for Assessment: MST  58 y.o. male  Admitting Dx: <principal problem not specified>  ASSESSMENT: 58 y.o. male presenting with Intractable N/V with dehydration. PMH is significant for Pancreatic cancer, HTN, and splenic vein thrombosis.  - Pt reports a weight loss of 54-50 lbs over the past 2 months. He attributes this to pain medication and newly diagnosed pancreatic cancer. He reports that he was able to eat jello and grape juice today. Pt would like to try nutritional supplements to add protein and calories to his diet.  - Pt says that members of his church have provided him with cases of ensure to drink at home. He was encouraged to drink them 2-3 per day once discharged.   Nutrition Focused Physical Exam:  Subcutaneous Fat:  Orbital Region: severe wasting Upper Arm Region: moderate wasting Thoracic and Lumbar Region: moderate wasting  Muscle:  Temple Region: severe wasting Clavicle Bone Region: severe wasting Clavicle and Acromion Bone Region: severe wasting Scapular Bone Region: moderate wasting Dorsal Hand: moderate wasting Patellar Region: moderate wasting Anterior Thigh Region: moderate wasting Posterior Calf Region: moderate wasting  Edema: none  Height: Ht Readings from Last 1 Encounters:  08/24/13 5\' 10"  (1.778 m)    Weight: Wt  Readings from Last 1 Encounters:  08/24/13 167 lb 1.6 oz (75.796 kg)    Ideal Body Weight: 73 kg  % Ideal Body Weight: 104%  Wt Readings from Last 10 Encounters:  08/24/13 167 lb 1.6 oz (75.796 kg)  08/16/13 166 lb 8 oz (75.524 kg)  08/03/13 172 lb (78.019 kg)  08/03/13 172 lb (78.019 kg)  07/30/13 172 lb 9.6 oz (78.291 kg)  07/20/13 187 lb (84.823 kg)  07/06/13 186 lb 9.6 oz (84.641 kg)  06/23/13 194 lb (87.998 kg)  06/08/13 204 lb 3.2 oz (92.625 kg)  06/01/13 202 lb (91.627 kg)    Usual Body Weight: 225 lbs  % Usual Body Weight: 74%  BMI:  Body mass index is 23.98 kg/(m^2).  Estimated Nutritional Needs: Kcal: 2000-2200 Protein: 110-120 g Fluid: 2.0-2.2 L/day  Skin: Intact  Diet Order: Clear Liquid  EDUCATION NEEDS: -Education needs addressed   Intake/Output Summary (Last 24 hours) at 08/25/13 1158 Last data filed at 08/25/13 0600  Gross per 24 hour  Intake   2655 ml  Output    750 ml  Net   1905 ml    Last BM: prior to admission   Labs:   Recent Labs Lab 08/23/13 2126 08/24/13 0817 08/25/13 0531  NA 134*  --  134*  K 5.2  --  4.8  CL 95*  --  96  CO2 26  --  23  BUN 15  --  14  CREATININE 0.93 0.91 0.82  CALCIUM 9.6  --  8.9  GLUCOSE 111*  --  118*    CBG (last 3)  No results found for this basename:  GLUCAP,  in the last 72 hours  Scheduled Meds: . amLODipine  10 mg Oral q morning - 10a  . dexamethasone  10 mg Intravenous Q12H  . losartan  100 mg Oral q morning - 10a  . pantoprazole (PROTONIX) IV  40 mg Intravenous Q12H  . rivaroxaban  20 mg Oral q morning - 10a    Continuous Infusions: . dextrose 5 % and 0.45% NaCl 150 mL/hr at 08/24/13 1946    Past Medical History  Diagnosis Date  . Blood transfusion   . GERD (gastroesophageal reflux disease)   . Hyperlipidemia   . Hypertension   . H/O hiatal hernia   . Arthritis     in his back, hips, knees, R shoulder   . Colon polyps     TUBULAR ADENOMA  . Splenic vein thrombosis    . Liver cyst   . Hx of pancreatitis   . Gout   . Pancreatic cancer     Past Surgical History  Procedure Laterality Date  . Hip surgery      x2 right hip- retained hardware ambulates with cane  . Shoulder surgery      right  . Hand surgery      left thumb tendon repair  . Femur hardware removal  2011    R femur- prothesis removed & replaced  . Posterior cervical fusion/foraminotomy N/A 04/20/2012    Procedure: POSTERIOR CERVICAL FUSION/FORAMINOTOMY LEVEL 1;  Surgeon: Ophelia Charter, MD;  Location: [facility];  Service: Neurosurgery;  Laterality: N/A;  C1/2 laminectomies with posterior screw fixation  . Colonoscopy w/ biopsies  04/15/2011  . Eus N/A 06/10/2013    Procedure: UPPER ENDOSCOPIC ULTRASOUND (EUS) LINEAR;  Surgeon: Milus Banister, MD;  Location: [facility];  Service: Endoscopy;  Laterality: N/A;  . Eus Bilateral 07/15/2013    Procedure: ESOPHAGEAL ENDOSCOPIC ULTRASOUND (EUS) RADIAL;  Surgeon: Milus Banister, MD;  Location: [facility];  Service: Endoscopy;  Laterality: Bilateral;  . Eus N/A 08/05/2013    Procedure: ESOPHAGEAL ENDOSCOPIC ULTRASOUND (EUS) RADIAL;  Surgeon: Milus Banister, MD;  Location: [facility];  Service: Endoscopy;  Laterality: N/A;  . Neurolytic celiac plexus N/A 08/05/2013    Procedure: NEUROLYTIC CELIAC PLEXUS;  Surgeon: Milus Banister, MD;  Location: [facility];  Service: Endoscopy;  Laterality: N/A;    Terrace Arabia RD, LDN

## 2013-08-26 DIAGNOSIS — E43 Unspecified severe protein-calorie malnutrition: Secondary | ICD-10-CM

## 2013-08-26 DIAGNOSIS — R197 Diarrhea, unspecified: Secondary | ICD-10-CM

## 2013-08-26 LAB — CBC WITH DIFFERENTIAL/PLATELET
Basophils Absolute: 0 10*3/uL (ref 0.0–0.1)
Basophils Relative: 0 % (ref 0–1)
EOS ABS: 0 10*3/uL (ref 0.0–0.7)
EOS PCT: 0 % (ref 0–5)
HCT: 37.6 % — ABNORMAL LOW (ref 39.0–52.0)
HEMOGLOBIN: 12.8 g/dL — AB (ref 13.0–17.0)
LYMPHS ABS: 1 10*3/uL (ref 0.7–4.0)
Lymphocytes Relative: 16 % (ref 12–46)
MCH: 28.3 pg (ref 26.0–34.0)
MCHC: 34 g/dL (ref 30.0–36.0)
MCV: 83 fL (ref 78.0–100.0)
MONOS PCT: 10 % (ref 3–12)
Monocytes Absolute: 0.7 10*3/uL (ref 0.1–1.0)
Neutro Abs: 4.9 10*3/uL (ref 1.7–7.7)
Neutrophils Relative %: 74 % (ref 43–77)
Platelets: 182 10*3/uL (ref 150–400)
RBC: 4.53 MIL/uL (ref 4.22–5.81)
RDW: 14.3 % (ref 11.5–15.5)
WBC: 6.6 10*3/uL (ref 4.0–10.5)

## 2013-08-26 LAB — BASIC METABOLIC PANEL
Anion gap: 15 (ref 5–15)
BUN: 23 mg/dL (ref 6–23)
CHLORIDE: 102 meq/L (ref 96–112)
CO2: 21 mEq/L (ref 19–32)
CREATININE: 0.89 mg/dL (ref 0.50–1.35)
Calcium: 9.3 mg/dL (ref 8.4–10.5)
GFR calc Af Amer: >90 mL/min (ref >90)
GFR calc non Af Amer: >90 mL/min (ref >90)
Glucose, Bld: 116 mg/dL — ABNORMAL HIGH (ref 70–99)
POTASSIUM: 4.6 meq/L (ref 3.7–5.3)
Sodium: 138 mEq/L (ref 137–147)

## 2013-08-26 LAB — CLOSTRIDIUM DIFFICILE BY PCR: Toxigenic C. Difficile by PCR: NEGATIVE

## 2013-08-26 MED ORDER — DEXAMETHASONE 4 MG PO TABS
8.0000 mg | ORAL_TABLET | Freq: Every day | ORAL | Status: AC
  Administered 2013-08-26: 8 mg via ORAL
  Filled 2013-08-26: qty 2

## 2013-08-26 MED ORDER — PANTOPRAZOLE SODIUM 40 MG PO TBEC
40.0000 mg | DELAYED_RELEASE_TABLET | Freq: Two times a day (BID) | ORAL | Status: DC
  Administered 2013-08-26 – 2013-08-27 (×3): 40 mg via ORAL
  Filled 2013-08-26 (×3): qty 1

## 2013-08-26 MED ORDER — LOPERAMIDE HCL 2 MG PO CAPS
2.0000 mg | ORAL_CAPSULE | ORAL | Status: DC | PRN
  Administered 2013-08-26: 2 mg via ORAL
  Filled 2013-08-26: qty 1

## 2013-08-26 MED ORDER — ONDANSETRON HCL 4 MG PO TABS: 4.0000 mg | ORAL_TABLET | Freq: Four times a day (QID) | ORAL | Status: DC | PRN

## 2013-08-26 NOTE — Progress Notes (Signed)
IP PROGRESS NOTE  Subjective:   Mr. Etchison reports no nausea and vomiting today. He was able to tolerate a diet yesterday. The abdominal pain remains improved. He had diarrhea beginning last night. This has persisted this morning.  Objective: Vital signs in last 24 hours: Blood pressure 121/83, pulse 63, temperature 98.1 F (36.7 C), temperature source Oral, resp. rate 18, height 5\' 10"  (1.778 m), weight 167 lb 1.6 oz (75.796 kg), SpO2 100.00%.  Intake/Output from previous day: 07/22 0701 - 07/23 0700 In: 480 [P.O.:480] Out: 400 [Urine:400]  Physical Exam:  HEENT: No thrush  Abdomen: Soft and nontender Extremities: No leg edema   Portacath/PICC-without erythema  Lab Results:  Recent Labs  08/25/13 0531 08/26/13 0400  WBC 3.5* 6.6  HGB 11.5* 12.8*  HCT 33.3* 37.6*  PLT 129* 182    BMET  Recent Labs  08/25/13 0531 08/26/13 0400  NA 134* 138  K 4.8 4.6  CL 96 102  CO2 23 21  GLUCOSE 118* 116*  BUN 14 23  CREATININE 0.82 0.89  CALCIUM 8.9 9.3    Studies/Results: No results found.  Medications: I have reviewed the patient's current medications.  Assessment/Plan:  1. Pancreas cancer. 07/06/2013 ,CA 19-9- 2067.  CT abdomen/pelvis 07/09/2013 with interval development of amorphous soft tissue circumferentially encasing the celiac axis and tracking caudally down toward the SMA. Associated stable irregular mild distention of the main pancreatic duct in the body and tail of the pancreas with no discrete mass lesion seen. 2.2 x 1.4 cm portacaval lymph node in the hepatoduodenal ligament had increased in size from 1.3 x 1.1 cm previously. Other scattered small lymph nodes seen in the hepatoduodenal ligament.  Endoscopic ultrasound 07/15/2013 with a very vague masslike lesion in the region of the uncinate pancreas. Fine needle aspiration showed atypical cells consistent with adenocarcinoma.  PET scan 07/29/2013 with hypermetabolic soft tissue posterior to the pancreas  with an adjacent hypermetabolic lymph node, no hypermetabolic pancreas mass, no evidence of distant metastatic disease  Cycle 1 FOLFIRINOX 08/02/2013 Cycle 2 the FOLFIRINOX 08/16/2013 2. Abdominal pain secondary to #1. 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 Hypertension. 4. History of gout. 5. Admission 08/24/2013 with nausea and vomiting-likely delayed nausea secondary to chemotherapy, improved 6. 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 [place] likely secondary to chemotherapy, add Imodium   the nausea has improved and he is tolerating a diet. He can be discharged to home if the diarrhea improves today. Decadron can be discontinued at discharge  Recommendations: 1. discontinue Decadron after a dose this morning 2. Advance diet as tolerated 3. Resume MS Contin and oxycodone for pain 4. add Imodium for diarrhea, check stool C. difficile toxin for persistent diarrhea 5. followup as scheduled at the [place] 08/30/2013  Please call oncology as needed. I will be out 08/27/2013 through 08/29/2013   LOS: 3 days   Tarah Buboltz  08/26/2013, 8:45 AM

## 2013-08-26 NOTE — Progress Notes (Signed)
Family Medicine Teaching Service Daily Progress Note Intern Pager: [phone]  Patient name: Ray [name] Medical record number: [id number] Date of birth: 18-Nov-1955 Age: 58 y.o. Gender: male  Primary Care Provider: Chrisandra Netters, MD Consultants: Oncology Code Status: DNR  Pt Overview and Major Events to Date:  7/21 Admitted for intractable N/V and rehydration, discussed pt with Onc [name]  Assessment and Plan:  Ray [name] is a 57 y.o. male presenting with Intractable N/V with dehydration. PMH is significant for Pancreatic cancer, HTN, and splenic vein thrombosis.   Intractable N/V 2/2 Chemotherapy for Pancreatic cancer:  Improved. Started day after last round of chemo.  Now tolerating full regular diet. - Transition to PO antiemetics prn - D/c decadron after 1 dose this AM - [name] following, appreciate recs  Diarrhea: Pt reports >10 episodes of watery stool O/N.  Likely related to chemotherapy, but must consider infectious source. - C diff PCR pending - Immodium prn per Dr. Learta Codding - Pt afebrile. If spikes a fever, would have low threshold to get blood cultures.   Dehydration 2/2 intractable N/V: Improved. Was oliguric on presentation to ED, and only made small amount of urine after 2-1L NS boluses. Now s/p 3 - 1L boluses. UOP good. - Continue to monitor strict Is & Os   Hyponatremia: Resolved. Na 138. Likely hypovolemic hyponatremia, given severe dehydration.  - Will continue to monitor   Pancreatic Cancer: Started chemo ~4 wks ago. Last chemo on Wednesday. Not currently neutropenic. ANC 3.5. 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.  HTN: BP stable.  - Continue home Norvasc and Losartan  - Continue to monitor   FEN/GI: Regular diet,  KVO Prophylaxis: home Xarelto  Disposition: Possibly home today if diarrhea has subsided.  Subjective:  He reports that N/V have resolved.  He ate a full dinner last night and b-fast this AM.  His pain is well controlled.    Objective: Temp:  [98.1 F (36.7 C)-98.3 F (36.8 C)] 98.1 F (36.7 C) (07/23 0545) Pulse Rate:  [63-77] 63 (07/23 0545) Resp:  [18] 18 (07/23 0545) BP: (121-131)/(83-94) 121/83 mmHg (07/23 0545) SpO2:  [100 %] 100 % (07/23 0545) Physical Exam: General: AA man sitting in bed in NAD, mild temporal wasting noted b/l  HEENT: MMM, no oral lesions, no palpable LAD  Cardiovascular: RRR, no m/r/g.  Respiratory: CTAB, no w/r/c  Abdomen: Soft, ND, NABS, mild tenderness in LUQ  Extremities: No LE edema, no calf tenderness  Neuro: A&Ox3.   Laboratory:  Recent Labs Lab 08/24/13 0817 08/25/13 0531 08/26/13 0400  WBC 4.5 3.5* 6.6  HGB 11.7* 11.5* 12.8*  HCT 34.6* 33.3* 37.6*  PLT 120* 129* 182    Recent Labs Lab 08/23/13 2126 08/24/13 0817 08/25/13 0531 08/26/13 0400  NA 134*  --  134* 138  K 5.2  --  4.8 4.6  CL 95*  --  96 102  CO2 26  --  23 21  BUN 15  --  14 23  CREATININE 0.93 0.91 0.82 0.89  CALCIUM 9.6  --  8.9 9.3  PROT 7.5  --   --   --   BILITOT 1.1  --   --   --   ALKPHOS 93  --   --   --  ALT 33  --   --   --   AST 19  --   --   --   GLUCOSE 111*  --  118* 116*   Lipase     Component Value Date/Time   LIPASE 9* 08/23/2013 2126    Hepatic Function Latest Ref Rng 08/23/2013 08/16/2013 08/03/2013  Total Protein 6.0 - 8.3 g/dL 7.5 7.1 7.6  Albumin 3.5 - 5.2 g/dL 4.0 3.8 4.1  AST 0 - 37 U/L $Remo'19 14 15  'niSMB$ ALT 0 - 53 U/L 33 22 18  Alk Phosphatase 39 - 117 U/L 93 93 92  Total Bilirubin 0.3 - 1.2 mg/dL 1.1 0.64 0.6     Lavon Paganini, MD 08/26/2013, 2:03 PM PGY-1, [facility] Intern pager: [phone], text pages welcome

## 2013-08-26 NOTE — Discharge Summary (Signed)
[facility] Discharge Summary  Patient name: Ray [name] Medical record number: [id number] Date of birth: [birth year] Age: 58 y.o. Gender: male Date of Admission: 08/23/2013  Date of Discharge: 08/27/2013 Admitting Physician: Lupita Dawn, MD  Primary Care Provider: Chrisandra Netters, MD Consultants: Oncology  Indication for Hospitalization: Intractable N/V, Dehydration  Discharge Diagnoses/Problem List:  Intractable N/V 2/2 chemotherapy for pancreatic cancer Diarrhea, likely 2/2 chemotherapy Dehydration 2/2 intractable N/V Hyponatremia Pancreatic cancer HTN  Disposition: Home  Discharge Condition: Stable  Discharge Exam:  General: AA man sitting in bed in NAD, mild temporal wasting noted b/l  HEENT: MMM, no oral lesions, no palpable LAD  Cardiovascular: RRR, no m/r/g.  Respiratory: CTAB, no w/r/c  Abdomen: Soft, ND, NABS, mild tenderness in LUQ  Extremities: No LE edema, no calf tenderness  Neuro: A&Ox3.   Brief Hospital Course: DARRAN GABAY is a 58 y.o. male presenting with Intractable N/V and dehydration. PMH is significant for Pancreatic cancer, HTN, and splenic vein thrombosis.  Pt was admitted for several days of intractable N/V starting the day after last round of chemotherapy for pancreatic cancer.  He was given IV compazine, zofran, and phenergan prn for nausea, and started on IV Decadron, as [name] (Onc) advised that this works well in delayed-onset nausea related to chemotherapy.  His diet was slowly advanced from NPO, until he was tolerating a full regular diet on 7/22.  He was transitioned to PO antiemetics and decadron was d/c'd on 7/23, as his N/V was resolved.  Patient reported >10 episodes of watery diarrhea O/N on 7/23.  C diff PCR was negative.  This resolved by day of discharge, but prescription was written for Imodium prn for diarrhea.  Patient had persistent hiccups during admission.  Prescription was given for  Baclofen 97m TID prn.  He was quite dehydrated on admission 2/2 vomiting Was oliguric in ED, and only produced a small amount of urine after 2-1L NS boluses.  This improved with another 1L NS bolus and mIVF during the first day of admission.  Creatinine was preserved at baseline and UOP is now good.  Patient was also hyponatremic to 134 on admission, presumed to be hypovolemic hyponatremia, given severe dehydration.  This has resolved.  For his pancreatic cancer, Dr. SBenay Spicerounded on him in the hospital.  He was not neutropenic with ANC 3.5 and did not have a fever during admission.  He was continued on home Xarelto for h/o splenic vein thrombosis.  He was in increased abdominal pain on admission, s/p celiac plexus neurolysis during 7/1 admission.  He was initially managed on IV dilaudid, but was transitioned back to home Percocet and MS Contin when tolerating PO on 7/23.  We considered palliative care consult during admission, but Dr. SBenay Spiceadvised uKoreanot to, as he is trying to save him.    Other chronic conditions were stable through admission and managed with outpt regimens.  Issues for Follow Up:  1. Patient to follow-up with Dr. SBenay Spiceafter discharge, and they will discuss ways to manage delayed-onset N/V.  2. F/u about whether or not Baclofen is helping with hiccups. 3. F/u on fluid status and further N/V/D.  Significant Procedures: None  Significant Labs and Imaging:   Recent Labs Lab 08/25/13 0531 08/26/13 0400 08/27/13 0406  WBC 3.5* 6.6 4.8  HGB 11.5* 12.8* 11.7*  HCT 33.3* 37.6* 34.8*  PLT 129* 182 148*    Recent Labs Lab 08/23/13 2126 08/24/13 0817 08/25/13 0531 08/26/13  0400 08/27/13 0406  NA 134*  --  134* 138 139  K 5.2  --  4.8 4.6 4.0  CL 95*  --  96 102 105  CO2 26  --  _0 GLUCOSE 111*  --  118* 116* 108*  BUN 15  --  _1 CREATININE 0.93 0.91 0.82 0.89 0.88  CALCIUM 9.6  --  8.9 9.3 8.8  ALKPHOS 93  --   --   --   --   AST 19  --   --    --   --   ALT 33  --   --   --   --   ALBUMIN 4.0  --   --   --   --     Lipase     Component Value Date/Time   LIPASE 9* 08/23/2013 2126    Hepatic Function Latest Ref Rng 08/23/2013 08/16/2013 08/03/2013  Total Protein 6.0 - 8.3 g/dL 7.5 7.1 7.6  Albumin 3.5 - 5.2 g/dL 4.0 3.8 4.1  AST 0 - 37 U/L _2 ALT 0 - 53 U/L 33 22 18  Alk Phosphatase 39 - 117 U/L 93 93 92  Total Bilirubin 0.3 - 1.2 mg/dL 1.1 0.64 0.6    Results/Tests Pending at Time of Discharge: None  Discharge Medications:    Medication List         amLODipine 10 MG tablet  Commonly known as:  NORVASC  Take 10 mg by mouth every morning.     baclofen 10 MG tablet  Commonly known as:  LIORESAL  Take 1 tablet (10 mg total) by mouth 3 (three) times daily with meals as needed (hiccups).     lidocaine-prilocaine cream  Commonly known as:  EMLA  Apply over port area 1-2 hours prior to treatment and cover with plastic wrap.  DO NOT RUB IN.     loperamide 2 MG capsule  Commonly known as:  IMODIUM  Take 1 capsule (2 mg total) by mouth as needed for diarrhea or loose stools.     losartan 100 MG tablet  Commonly known as:  COZAAR  Take 100 mg by mouth every morning.     morphine 30 MG 12 hr tablet  Commonly known as:  MS CONTIN  Take 1 tablet (30 mg total) by mouth every 12 (twelve) hours.     omeprazole 40 MG capsule  Commonly known as:  PRILOSEC  Take 40 mg by mouth daily.     ondansetron 4 MG tablet  Commonly known as:  ZOFRAN  Take 1 tablet (4 mg total) by mouth every 6 (six) hours as needed for nausea.     oxyCODONE-acetaminophen 10-325 MG per tablet  Commonly known as:  PERCOCET  Take 1-2 tablets by mouth every 4 (four) hours as needed for pain     prochlorperazine 10 MG tablet  Commonly known as:  COMPAZINE  Take 1 tablet (10 mg total) by mouth every 6 (six) hours as needed for nausea or vomiting.     rivaroxaban 20 MG Tabs tablet  Commonly known as:  XARELTO  Take 1 tablet (20 mg total)  by mouth every morning.     sorbitol 70 % Soln  Take 15 ml - 30 ml by mouth 2 (two) times a days as needed for constipation     SYSTANE 0.4-0.3 % Soln  Generic drug:  Polyethyl Glycol-Propyl Glycol  Apply 1 drop to eye 3 (three) times  daily as needed (dry eyes).        Discharge Instructions: Please refer to Patient Instructions section of EMR for full details.  Patient was counseled important signs and symptoms that should prompt return to medical care, changes in medications, dietary instructions, activity restrictions, and follow up appointments.   Follow-Up Appointments: Follow-up Information   Follow up with Chrisandra Netters, MD On 09/01/2013. (Appt made at 10:45 for hospital follow-up.)    Specialty:  Family Medicine   Contact information:   [place] [address] [phone]       Schedule an appointment as soon as possible for a visit with Betsy Coder, MD. ([name] would like to see you on 7/27)    Specialty:  Oncology   Contact information:   [place] [address] [phone]       Lavon Paganini, MD 08/27/2013, 12:07 PM PGY-1, [place]

## 2013-08-26 NOTE — Progress Notes (Signed)
FMTS Attending Daily Note:  Ray Sabal MD  [phone] pager  Family Practice pager:  [phone] I have seen and examined this patient and have reviewed their chart. I have discussed this patient with the resident. I agree with the resident's findings, assessment and care plan.  Additionally:  - Much improved from pain standpoint. - Tolerating PO food thus far.   - However still with pretty extensive diarrhea.  C dif pending.   - LIkely won't be able to DC home until tomorrow pending improvement in diarrhea.   Ray Reasons, MD 08/26/2013 4:25 PM

## 2013-08-27 LAB — BASIC METABOLIC PANEL
Anion gap: 14 (ref 5–15)
BUN: 22 mg/dL (ref 6–23)
CALCIUM: 8.8 mg/dL (ref 8.4–10.5)
CO2: 20 meq/L (ref 19–32)
CREATININE: 0.88 mg/dL (ref 0.50–1.35)
Chloride: 105 mEq/L (ref 96–112)
GFR calc Af Amer: >90 mL/min (ref >90)
GFR calc non Af Amer: >90 mL/min (ref >90)
GLUCOSE: 108 mg/dL — AB (ref 70–99)
Potassium: 4 mEq/L (ref 3.7–5.3)
Sodium: 139 mEq/L (ref 137–147)

## 2013-08-27 LAB — CBC WITH DIFFERENTIAL/PLATELET
BASOS ABS: 0 10*3/uL (ref 0.0–0.1)
Basophils Relative: 0 % (ref 0–1)
EOS PCT: 0 % (ref 0–5)
Eosinophils Absolute: 0 10*3/uL (ref 0.0–0.7)
HEMATOCRIT: 34.8 % — AB (ref 39.0–52.0)
Hemoglobin: 11.7 g/dL — ABNORMAL LOW (ref 13.0–17.0)
LYMPHS PCT: 19 % (ref 12–46)
Lymphs Abs: 0.9 10*3/uL (ref 0.7–4.0)
MCH: 27.8 pg (ref 26.0–34.0)
MCHC: 33.6 g/dL (ref 30.0–36.0)
MCV: 82.7 fL (ref 78.0–100.0)
MONO ABS: 0.8 10*3/uL (ref 0.1–1.0)
Monocytes Relative: 16 % — ABNORMAL HIGH (ref 3–12)
Neutro Abs: 3.1 10*3/uL (ref 1.7–7.7)
Neutrophils Relative %: 65 % (ref 43–77)
Platelets: 148 10*3/uL — ABNORMAL LOW (ref 150–400)
RBC: 4.21 MIL/uL — ABNORMAL LOW (ref 4.22–5.81)
RDW: 14.9 % (ref 11.5–15.5)
WBC: 4.8 10*3/uL (ref 4.0–10.5)

## 2013-08-27 MED ORDER — BACLOFEN 10 MG PO TABS: 10.0000 mg | ORAL_TABLET | Freq: Three times a day (TID) | ORAL | Status: DC | PRN

## 2013-08-27 MED ORDER — LOPERAMIDE HCL 2 MG PO CAPS: 2.0000 mg | ORAL_CAPSULE | ORAL | Status: DC | PRN

## 2013-08-27 MED ORDER — LOPERAMIDE HCL 2 MG PO CAPS
2.0000 mg | ORAL_CAPSULE | ORAL | Status: DC | PRN
  Administered 2013-08-27 (×2): 2 mg via ORAL
  Filled 2013-08-27 (×2): qty 1

## 2013-08-27 NOTE — Discharge Instructions (Signed)
You were admitted for dehydration and nausea, vomiting, and diarrhea that is thought to be related to your chemotherapy.  This is under control now.  You will follow-up with your oncologist, [name] about whether something can be changed with your chemotherapy to make you have less nausea/vomiting/diarrhea afterward.  I have sent a prescription for baclofen for your hiccups.  Hopefully this will help with the symptoms.   Nausea and Vomiting Nausea is a sick feeling that often comes before throwing up (vomiting). Vomiting is a reflex where stomach contents come out of your mouth. Vomiting can cause severe loss of body fluids (dehydration). Children and elderly adults can become dehydrated quickly, especially if they also have diarrhea. Nausea and vomiting are symptoms of a condition or disease. It is important to find the cause of your symptoms. CAUSES   Direct irritation of the stomach lining. This irritation can result from increased acid production (gastroesophageal reflux disease), infection, food poisoning, taking certain medicines (such as nonsteroidal anti-inflammatory drugs), alcohol use, or tobacco use.  Signals from the brain.These signals could be caused by a headache, heat exposure, an inner ear disturbance, increased pressure in the brain from injury, infection, a tumor, or a concussion, pain, emotional stimulus, or metabolic problems.  An obstruction in the gastrointestinal tract (bowel obstruction).  Illnesses such as diabetes, hepatitis, gallbladder problems, appendicitis, kidney problems, cancer, sepsis, atypical symptoms of a heart attack, or eating disorders.  Medical treatments such as chemotherapy and radiation.  Receiving medicine that makes you sleep (general anesthetic) during surgery. DIAGNOSIS Your caregiver may ask for tests to be done if the problems do not improve after a few days. Tests may also be done if symptoms are severe or if the reason for the nausea and  vomiting is not clear. Tests may include:  Urine tests.  Blood tests.  Stool tests.  Cultures (to look for evidence of infection).  X-rays or other imaging studies. Test results can help your caregiver make decisions about treatment or the need for additional tests. TREATMENT You need to stay well hydrated. Drink frequently but in small amounts.You may wish to drink water, sports drinks, clear broth, or eat frozen ice pops or gelatin dessert to help stay hydrated.When you eat, eating slowly may help prevent nausea.There are also some antinausea medicines that may help prevent nausea. HOME CARE INSTRUCTIONS   Take all medicine as directed by your caregiver.  If you do not have an appetite, do not force yourself to eat. However, you must continue to drink fluids.  If you have an appetite, eat a normal diet unless your caregiver tells you differently.  Eat a variety of complex carbohydrates (rice, wheat, potatoes, bread), lean meats, yogurt, fruits, and vegetables.  Avoid high-fat foods because they are more difficult to digest.  Drink enough water and fluids to keep your urine clear or pale yellow.  If you are dehydrated, ask your caregiver for specific rehydration instructions. Signs of dehydration may include:  Severe thirst.  Dry lips and mouth.  Dizziness.  Dark urine.  Decreasing urine frequency and amount.  Confusion.  Rapid breathing or pulse. SEEK IMMEDIATE MEDICAL CARE IF:   You have blood or brown flecks (like coffee grounds) in your vomit.  You have black or bloody stools.  You have a severe headache or stiff neck.  You are confused.  You have severe abdominal pain.  You have chest pain or trouble breathing.  You do not urinate at least once every 8 hours.  You develop cold or clammy skin.  You continue to vomit for longer than 24 to 48 hours.  You have a fever. MAKE SURE YOU:   Understand these instructions.  Will watch your  condition.  Will get help right away if you are not doing well or get worse. Document Released: 01/21/2005 Document Revised: 04/15/2011 Document Reviewed: 06/20/2010 [facility] Patient Information 2015 [place], [place]. This information is not intended to replace advice given to you by your health care provider. Make sure you discuss any questions you have with your health care provider.

## 2013-08-27 NOTE — Progress Notes (Signed)
Discussed discharge summary with patient. Reviewed all medications with patient. Patient did not have any questions. Patient ready for discharge and is waiting for wife to pick him up.

## 2013-08-27 NOTE — Discharge Summary (Signed)
Family Medicine Teaching Service  Discharge Note : Attending Jeff Walden MD Pager [phone] Inpatient Team Pager:  [phone]  I have reviewed this patient and the patient's chart and have discussed discharge planning with the resident at the time of discharge. I agree with the discharge plan as above.    

## 2013-08-29 ENCOUNTER — Other Ambulatory Visit: Payer: Self-pay | Admitting: Oncology

## 2013-08-30 ENCOUNTER — Telehealth: Payer: Self-pay | Admitting: *Deleted

## 2013-08-30 ENCOUNTER — Telehealth: Payer: Self-pay | Admitting: Nurse Practitioner

## 2013-08-30 ENCOUNTER — Other Ambulatory Visit (HOSPITAL_BASED_OUTPATIENT_CLINIC_OR_DEPARTMENT_OTHER)

## 2013-08-30 ENCOUNTER — Ambulatory Visit (HOSPITAL_BASED_OUTPATIENT_CLINIC_OR_DEPARTMENT_OTHER): Admitting: Nurse Practitioner

## 2013-08-30 ENCOUNTER — Ambulatory Visit

## 2013-08-30 VITALS — BP 102/67 | HR 93 | Temp 98.6°F | Resp 18 | Ht 70.0 in | Wt 151.5 lb

## 2013-08-30 DIAGNOSIS — C257 Malignant neoplasm of other parts of pancreas: Secondary | ICD-10-CM

## 2013-08-30 DIAGNOSIS — M109 Gout, unspecified: Secondary | ICD-10-CM

## 2013-08-30 DIAGNOSIS — Z7901 Long term (current) use of anticoagulants: Secondary | ICD-10-CM

## 2013-08-30 DIAGNOSIS — I10 Essential (primary) hypertension: Secondary | ICD-10-CM

## 2013-08-30 DIAGNOSIS — C259 Malignant neoplasm of pancreas, unspecified: Secondary | ICD-10-CM

## 2013-08-30 DIAGNOSIS — R112 Nausea with vomiting, unspecified: Secondary | ICD-10-CM

## 2013-08-30 LAB — COMPREHENSIVE METABOLIC PANEL (CC13)
ALK PHOS: 81 U/L (ref 40–150)
ALT: 30 U/L (ref 0–55)
AST: 14 U/L (ref 5–34)
Albumin: 3.6 g/dL (ref 3.5–5.0)
Anion Gap: 10 mEq/L (ref 3–11)
BILIRUBIN TOTAL: 0.78 mg/dL (ref 0.20–1.20)
BUN: 19.9 mg/dL (ref 7.0–26.0)
CHLORIDE: 108 meq/L (ref 98–109)
CO2: 22 mEq/L (ref 22–29)
CREATININE: 1.3 mg/dL (ref 0.7–1.3)
Calcium: 9.1 mg/dL (ref 8.4–10.4)
Glucose: 134 mg/dl (ref 70–140)
Potassium: 3.2 mEq/L — ABNORMAL LOW (ref 3.5–5.1)
Sodium: 140 mEq/L (ref 136–145)
Total Protein: 6.6 g/dL (ref 6.4–8.3)

## 2013-08-30 LAB — CBC WITH DIFFERENTIAL/PLATELET
BASO%: 0 % (ref 0.0–2.0)
Basophils Absolute: 0 10*3/uL (ref 0.0–0.1)
EOS%: 0.6 % (ref 0.0–7.0)
Eosinophils Absolute: 0 10*3/uL (ref 0.0–0.5)
HEMATOCRIT: 36.6 % — AB (ref 38.4–49.9)
HGB: 12.6 g/dL — ABNORMAL LOW (ref 13.0–17.1)
LYMPH#: 1.6 10*3/uL (ref 0.9–3.3)
LYMPH%: 50.6 % — AB (ref 14.0–49.0)
MCH: 28.1 pg (ref 27.2–33.4)
MCHC: 34.4 g/dL (ref 32.0–36.0)
MCV: 81.5 fL (ref 79.3–98.0)
MONO#: 0.6 10*3/uL (ref 0.1–0.9)
MONO%: 20.3 % — AB (ref 0.0–14.0)
NEUT#: 0.9 10*3/uL — ABNORMAL LOW (ref 1.5–6.5)
NEUT%: 28.5 % — ABNORMAL LOW (ref 39.0–75.0)
Platelets: 135 10*3/uL — ABNORMAL LOW (ref 140–400)
RBC: 4.49 10*6/uL (ref 4.20–5.82)
RDW: 15.4 % — ABNORMAL HIGH (ref 11.0–14.6)
WBC: 3.1 10*3/uL — ABNORMAL LOW (ref 4.0–10.3)

## 2013-08-30 LAB — CANCER ANTIGEN 19-9: CA 19-9: 19902.3 U/mL — ABNORMAL HIGH (ref <35.0)

## 2013-08-30 MED ORDER — DEXAMETHASONE 4 MG PO TABS: 8.0000 mg | ORAL_TABLET | Freq: Two times a day (BID) | ORAL | Status: DC

## 2013-08-30 NOTE — Progress Notes (Signed)
[name] OFFICE PROGRESS NOTE   Diagnosis:  Pancreas cancer.  INTERVAL HISTORY:   Ray [name] returns as scheduled. He was hospitalized 08/24/2013 through 08/27/2013 with poorly controlled nausea/vomiting. He also had some diarrhea. He continues to have nausea. He is taking Compazine and Zofran. No further vomiting. He had 2 loose stools yesterday. He is taking Imodium as needed. No loose stools thus far today. No hand or foot pain or redness. The cold sensitivity typically lasts for "a few days" following chemotherapy. He denies persistent neuropathy symptoms. Abdominal pain continues to be improved. He continues MS Contin with Percocet as needed. He feels weak. He has lost some weight.  Objective:  Vital signs in last 24 hours:  Blood pressure 102/67, pulse 93, temperature 98.6 F (37 C), temperature source Oral, resp. rate 18, height 5\' 10"  (1.778 m), weight 151 lb 8 oz (68.72 kg), SpO2 100.00%.    HEENT: No thrush or ulcerations. Resp: Lungs are clear. Cardio: Regular cardiac rhythm. GI: Abdomen is soft. No hepatomegaly. No mass. Tender at the left upper abdomen. Vascular: No leg edema. Neuro: Vibratory sense intact to minimally decreased over the fingertips per tuning fork exam.   Port-A-Cath site without erythema.    Lab Results:  Lab Results  Component Value Date   WBC 3.1* 08/30/2013   HGB 12.6* 08/30/2013   HCT 36.6* 08/30/2013   MCV 81.5 08/30/2013   PLT 135* 08/30/2013   NEUTROABS 0.9* 08/30/2013    Imaging:  No results found.  Medications: I have reviewed the patient's current medications.  Assessment/Plan: 1. Pancreas cancer. 07/06/2013 ,CA 19-9- 2067.  CT abdomen/pelvis 07/09/2013 with interval development of amorphous soft tissue circumferentially encasing the celiac axis and tracking caudally down toward the SMA. Associated stable irregular mild distention of the main pancreatic duct in the body and tail of the pancreas with no discrete mass  lesion seen. 2.2 x 1.4 cm portacaval lymph node in the hepatoduodenal ligament had increased in size from 1.3 x 1.1 cm previously. Other scattered small lymph nodes seen in the hepatoduodenal ligament.  Endoscopic ultrasound 07/15/2013 with a very vague masslike lesion in the region of the uncinate pancreas. Fine needle aspiration showed atypical cells consistent with adenocarcinoma.  PET scan 07/29/2013 with hypermetabolic soft tissue posterior to the pancreas with an adjacent hypermetabolic lymph node, no hypermetabolic pancreas mass, no evidence of distant metastatic disease  Cycle 1 FOLFIRINOX 08/02/2013. Cycle 2 FOLFIRINOX 08/16/2013. 2. Abdominal pain secondary to #1. 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 Hypertension. 4. History of gout. 5. Nausea and vomiting following FOLFIRINOX chemotherapy.   Aloxi and Emend added with cycle 2.   Decadron added with cycle 3. 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 7. Hospitalization 08/24/2013 through 08/27/2013 with poorly controlled nausea/vomiting. Diarrhea during the hospitalization.    Disposition: Ray [name] has completed 2 cycles of FOLFIRINOX. He was hospitalized with nausea/vomiting following cycle 2. We will add Decadron 8 mg twice daily for 3 days with cycle 3 beginning on day 2.   He is neutropenic on labs today. We will hold cycle 3 and reschedule for one week. Neulasta will be added on the day of pump discontinuation beginning with cycle 3. We reviewed potential toxicities associated with Neulasta including bone pain, rash, splenic rupture.  We will see him in followup on 09/21/2013 which will be prior to cycle 4. He will contact the office in the interim with any problems.  Plan reviewed with [name]. 25 minutes were spent face-to-face  at today's visit with the majority of that time involved in counseling/coordination of care.  Ned Card ANP/GNP-BC   08/30/2013  9:55 AM

## 2013-08-30 NOTE — Telephone Encounter (Signed)
Pt confirmed labs/ov per 07/27, chkng on schedule w/Dr's available for dates showing on POF, advised pt I would call him and mail updated schedule to him.....gave pt AVS...Marland KitchenMarland KitchenKJ

## 2013-08-30 NOTE — Telephone Encounter (Signed)
Per POF staff message scheduled appts. Advised scheduler 

## 2013-08-31 ENCOUNTER — Other Ambulatory Visit: Payer: Self-pay

## 2013-08-31 MED ORDER — POTASSIUM CHLORIDE CRYS ER 20 MEQ PO TBCR: 20.0000 meq | EXTENDED_RELEASE_TABLET | Freq: Every day | ORAL | Status: DC

## 2013-08-31 NOTE — Telephone Encounter (Signed)
Called to inform patient Potassium was low at 3.2 and [name] would like to start him on K+ 40meq daily. Confirmed pharmacy with patient, sent in rx to [facility].

## 2013-09-01 ENCOUNTER — Encounter

## 2013-09-01 ENCOUNTER — Ambulatory Visit (INDEPENDENT_AMBULATORY_CARE_PROVIDER_SITE_OTHER): Admitting: Family Medicine

## 2013-09-01 ENCOUNTER — Encounter: Payer: Self-pay | Admitting: Family Medicine

## 2013-09-01 VITALS — BP 96/68 | HR 83 | Temp 98.1°F | Ht 70.0 in | Wt 156.6 lb

## 2013-09-01 DIAGNOSIS — I10 Essential (primary) hypertension: Secondary | ICD-10-CM

## 2013-09-01 NOTE — Progress Notes (Signed)
Patient ID: Ray [name], male   DOB: 10-Mar-1955, 58 y.o.   MRN: [id number]  HPI:  Patient presents with his wife Ray [name], to follow up on recent hospitalization. He was admitted for intractable nausea and vomiting in setting of being on chemotherapy for pancreatic cancer. He reports he is doing better now. Is keeping food and liquids down. Still is nauseated much of the time, with little help from compazine and zofran, but has not been vomiting. Pain is manageable with MS Contin BID and percocet as needed for breakthrough pain. He tries to limit the percocet as much as possible and only takes it when he really needs it. Was supposed to get chemo again this week, but was neutropenic on labwork so they decided to wait another week. Of note, he has been feeling lightheaded lately, like he is going to pass out of he stands up quickly. Taking losartan and amlodipine for HTN. Has still had some diarrhea but immodium helps a little with that.  ROS: See HPI.  [place]: hx HTN, GERD, splenic vein thrombosis secondary to pancreatic cancer.  PHYSICAL EXAM: BP 96/68  Pulse 83  Temp(Src) 98.1 F (36.7 C) (Oral)  Ht 5\' 10"  (1.778 m)  Wt 156 lb 9.6 oz (71.033 kg)  BMI 22.47 kg/m2 Gen: NAD HEENT: NCAT, MMM Heart: RRR Lungs: CTAB, NWOB Neuro: grossly nonfocal, speech normal Abd: soft, mildly TTP epigastric area Ext: No appreciable lower extremity edema bilaterally   ASSESSMENT/PLAN:  Hypertension BP low today and pt symptomatically lightheaded. Suspect this is related to being mildly dehydrated (despite drinking plenty of fluids) in setting of diarrhea. Also even more likely, is that he requires less medication to control his BP now since he has lost 50+ pounds over the last 2-3 months due to his pancreatic cancer.  Plan: -stop losartan altogether, given concern for hypotension/dehydration with possible resultant AKI if worsens -continue amlodipine -follow up in a few weeks for recheck of BP  FOLLOW  UP: F/u in a few weeks for recheck BP.  [place]. [name], [place]

## 2013-09-01 NOTE — Assessment & Plan Note (Signed)
BP low today and pt symptomatically lightheaded. Suspect this is related to being mildly dehydrated (despite drinking plenty of fluids) in setting of diarrhea. Also even more likely, is that he requires less medication to control his BP now since he has lost 50+ pounds over the last 2-3 months due to his pancreatic cancer.  Plan: -stop losartan altogether, given concern for hypotension/dehydration with possible resultant AKI if worsens -continue amlodipine -follow up in a few weeks for recheck of BP

## 2013-09-01 NOTE — Patient Instructions (Signed)
It was great to see you again today!  Stop the losartan for your blood pressure, since it is low today Keep up the good work with drinking plenty of fluids Follow up with me in a few weeks to recheck your BP.  Call if you have any problems in the meantime.  Be well, Dr. [name]

## 2013-09-03 ENCOUNTER — Ambulatory Visit

## 2013-09-03 ENCOUNTER — Ambulatory Visit: Admitting: Nurse Practitioner

## 2013-09-03 ENCOUNTER — Other Ambulatory Visit: Payer: Self-pay

## 2013-09-03 ENCOUNTER — Encounter

## 2013-09-03 ENCOUNTER — Telehealth: Payer: Self-pay | Admitting: *Deleted

## 2013-09-03 ENCOUNTER — Telehealth: Payer: Self-pay

## 2013-09-03 ENCOUNTER — Other Ambulatory Visit: Payer: Self-pay | Admitting: Nurse Practitioner

## 2013-09-03 NOTE — Telephone Encounter (Signed)
PER POF date/time correction

## 2013-09-03 NOTE — Telephone Encounter (Signed)
Patient called regarding schedule. Updated patient on schedule and fixed appointment days times according to POF's. Informed patient next appointment was on 09/06/13 at 1115 and he can pick up an updated schedule on that day as well.

## 2013-09-06 ENCOUNTER — Other Ambulatory Visit: Payer: Self-pay

## 2013-09-06 ENCOUNTER — Ambulatory Visit (HOSPITAL_BASED_OUTPATIENT_CLINIC_OR_DEPARTMENT_OTHER)

## 2013-09-06 ENCOUNTER — Ambulatory Visit (HOSPITAL_BASED_OUTPATIENT_CLINIC_OR_DEPARTMENT_OTHER): Admitting: *Deleted

## 2013-09-06 VITALS — BP 154/83 | HR 80 | Temp 98.6°F | Resp 16 | Wt 162.0 lb

## 2013-09-06 DIAGNOSIS — C257 Malignant neoplasm of other parts of pancreas: Secondary | ICD-10-CM

## 2013-09-06 DIAGNOSIS — C259 Malignant neoplasm of pancreas, unspecified: Secondary | ICD-10-CM

## 2013-09-06 DIAGNOSIS — Z5111 Encounter for antineoplastic chemotherapy: Secondary | ICD-10-CM

## 2013-09-06 LAB — CBC WITH DIFFERENTIAL/PLATELET
BASO%: 0.7 % (ref 0.0–2.0)
BASOS ABS: 0.1 10*3/uL (ref 0.0–0.1)
EOS ABS: 0 10*3/uL (ref 0.0–0.5)
EOS%: 0.2 % (ref 0.0–7.0)
HCT: 33.2 % — ABNORMAL LOW (ref 38.4–49.9)
HEMOGLOBIN: 10.8 g/dL — AB (ref 13.0–17.1)
LYMPH%: 33.3 % (ref 14.0–49.0)
MCH: 27.9 pg (ref 27.2–33.4)
MCHC: 32.7 g/dL (ref 32.0–36.0)
MCV: 85.6 fL (ref 79.3–98.0)
MONO#: 0.9 10*3/uL (ref 0.1–0.9)
MONO%: 11.4 % (ref 0.0–14.0)
NEUT%: 54.4 % (ref 39.0–75.0)
NEUTROS ABS: 4.5 10*3/uL (ref 1.5–6.5)
PLATELETS: 201 10*3/uL (ref 140–400)
RBC: 3.88 10*6/uL — ABNORMAL LOW (ref 4.20–5.82)
RDW: 16.3 % — ABNORMAL HIGH (ref 11.0–14.6)
WBC: 8.3 10*3/uL (ref 4.0–10.3)
lymph#: 2.8 10*3/uL (ref 0.9–3.3)

## 2013-09-06 LAB — COMPREHENSIVE METABOLIC PANEL (CC13)
ALBUMIN: 3.2 g/dL — AB (ref 3.5–5.0)
ALK PHOS: 101 U/L (ref 40–150)
ALT: 15 U/L (ref 0–55)
ANION GAP: 8 meq/L (ref 3–11)
AST: 13 U/L (ref 5–34)
BUN: 9.2 mg/dL (ref 7.0–26.0)
CO2: 28 meq/L (ref 22–29)
Calcium: 9.1 mg/dL (ref 8.4–10.4)
Chloride: 103 mEq/L (ref 98–109)
Creatinine: 0.8 mg/dL (ref 0.7–1.3)
GLUCOSE: 115 mg/dL (ref 70–140)
POTASSIUM: 3.4 meq/L — AB (ref 3.5–5.1)
Sodium: 139 mEq/L (ref 136–145)
Total Bilirubin: 0.47 mg/dL (ref 0.20–1.20)
Total Protein: 6.5 g/dL (ref 6.4–8.3)

## 2013-09-06 MED ORDER — ATROPINE SULFATE 1 MG/ML IJ SOLN
INTRAMUSCULAR | Status: AC
  Filled 2013-09-06: qty 1

## 2013-09-06 MED ORDER — DEXTROSE 5 % IV SOLN
Freq: Once | INTRAVENOUS | Status: AC
  Administered 2013-09-06: 14:00:00 via INTRAVENOUS

## 2013-09-06 MED ORDER — DEXAMETHASONE SODIUM PHOSPHATE 20 MG/5ML IJ SOLN
INTRAMUSCULAR | Status: AC
  Filled 2013-09-06: qty 5

## 2013-09-06 MED ORDER — SODIUM CHLORIDE 0.9 % IV SOLN
2400.0000 mg/m2 | INTRAVENOUS | Status: DC
  Administered 2013-09-06: 4750 mg via INTRAVENOUS
  Filled 2013-09-06: qty 95

## 2013-09-06 MED ORDER — PALONOSETRON HCL INJECTION 0.25 MG/5ML
0.2500 mg | Freq: Once | INTRAVENOUS | Status: AC
  Administered 2013-09-06: 0.25 mg via INTRAVENOUS

## 2013-09-06 MED ORDER — ATROPINE SULFATE 1 MG/ML IJ SOLN
0.5000 mg | Freq: Once | INTRAMUSCULAR | Status: AC | PRN
  Administered 2013-09-06: 0.5 mg via INTRAVENOUS

## 2013-09-06 MED ORDER — PALONOSETRON HCL INJECTION 0.25 MG/5ML
INTRAVENOUS | Status: AC
  Filled 2013-09-06: qty 5

## 2013-09-06 MED ORDER — SODIUM CHLORIDE 0.9 % IV SOLN
150.0000 mg | Freq: Once | INTRAVENOUS | Status: AC
  Administered 2013-09-06: 150 mg via INTRAVENOUS
  Filled 2013-09-06: qty 5

## 2013-09-06 MED ORDER — IRINOTECAN HCL CHEMO INJECTION 100 MG/5ML
140.0000 mg/m2 | Freq: Once | INTRAVENOUS | Status: AC
  Administered 2013-09-06: 276 mg via INTRAVENOUS
  Filled 2013-09-06: qty 13.8

## 2013-09-06 MED ORDER — LEUCOVORIN CALCIUM INJECTION 350 MG
400.0000 mg/m2 | Freq: Once | INTRAVENOUS | Status: AC
  Administered 2013-09-06: 788 mg via INTRAVENOUS
  Filled 2013-09-06: qty 39.4

## 2013-09-06 MED ORDER — DEXAMETHASONE SODIUM PHOSPHATE 20 MG/5ML IJ SOLN
12.0000 mg | Freq: Once | INTRAMUSCULAR | Status: AC
  Administered 2013-09-06: 12 mg via INTRAVENOUS

## 2013-09-06 MED ORDER — OXALIPLATIN CHEMO INJECTION 100 MG/20ML
85.0000 mg/m2 | Freq: Once | INTRAVENOUS | Status: AC
  Administered 2013-09-06: 165 mg via INTRAVENOUS
  Filled 2013-09-06: qty 33

## 2013-09-06 NOTE — Patient Instructions (Signed)
[facility] Discharge Instructions for Patients Receiving Chemotherapy  Today you received the following chemotherapy agents oxaliplatin/leucovorin/irinotecan/fluorouracil.    To help prevent nausea and vomiting after your treatment, we encourage you to take your nausea medication as directed.    If you develop nausea and vomiting that is not controlled by your nausea medication, call the clinic.   BELOW ARE SYMPTOMS THAT SHOULD BE REPORTED IMMEDIATELY:  *FEVER GREATER THAN 100.5 F  *CHILLS WITH OR WITHOUT FEVER  NAUSEA AND VOMITING THAT IS NOT CONTROLLED WITH YOUR NAUSEA MEDICATION  *UNUSUAL SHORTNESS OF BREATH  *UNUSUAL BRUISING OR BLEEDING  TENDERNESS IN MOUTH AND THROAT WITH OR WITHOUT PRESENCE OF ULCERS  *URINARY PROBLEMS  *BOWEL PROBLEMS  UNUSUAL RASH Items with * indicate a potential emergency and should be followed up as soon as possible.  Feel free to call the clinic you have any questions or concerns. The clinic phone number is [phone].

## 2013-09-06 NOTE — Progress Notes (Signed)
1655 pt c/o crampy stomach and cramping in L hand. He also has some of the slurred speech as the prior chemo infusions. Chemo on hold. Atropine given  1715 cramping easing in abd and hand. Chemo restarted.

## 2013-09-07 ENCOUNTER — Telehealth: Payer: Self-pay | Admitting: *Deleted

## 2013-09-07 NOTE — Telephone Encounter (Signed)
Left message for patient to call back for lab results

## 2013-09-07 NOTE — Telephone Encounter (Signed)
Pt returned call, instructed him to continue potassium. He voiced understanding, confirms he is taking it daily.

## 2013-09-07 NOTE — Telephone Encounter (Signed)
Message copied by Wardell Heath on Tue Sep 07, 2013  9:19 AM ------      Message from: Ned Card K      Created: Mon Sep 06, 2013  5:04 PM       Please have him continue Kdur 20 mEq daily.      ----- Message -----         From: [facility]         Sent: 09/06/2013  11:27 AM           To: Owens Shark, NP                   ------

## 2013-09-08 ENCOUNTER — Ambulatory Visit

## 2013-09-08 ENCOUNTER — Ambulatory Visit (HOSPITAL_BASED_OUTPATIENT_CLINIC_OR_DEPARTMENT_OTHER)

## 2013-09-08 VITALS — BP 130/78 | HR 79

## 2013-09-08 DIAGNOSIS — C257 Malignant neoplasm of other parts of pancreas: Secondary | ICD-10-CM

## 2013-09-08 DIAGNOSIS — C259 Malignant neoplasm of pancreas, unspecified: Secondary | ICD-10-CM

## 2013-09-08 DIAGNOSIS — Z5189 Encounter for other specified aftercare: Secondary | ICD-10-CM

## 2013-09-08 MED ORDER — HEPARIN SOD (PORK) LOCK FLUSH 100 UNIT/ML IV SOLN
500.0000 [IU] | Freq: Once | INTRAVENOUS | Status: AC | PRN
  Administered 2013-09-08: 500 [IU]
  Filled 2013-09-08: qty 5

## 2013-09-08 MED ORDER — SODIUM CHLORIDE 0.9 % IJ SOLN
10.0000 mL | INTRAMUSCULAR | Status: DC | PRN
  Administered 2013-09-08: 10 mL
  Filled 2013-09-08: qty 10

## 2013-09-08 MED ORDER — PEGFILGRASTIM INJECTION 6 MG/0.6ML
6.0000 mg | Freq: Once | SUBCUTANEOUS | Status: AC
  Administered 2013-09-08: 6 mg via SUBCUTANEOUS
  Filled 2013-09-08: qty 0.6

## 2013-09-08 NOTE — Patient Instructions (Signed)
Pegfilgrastim injection What is this medicine? PEGFILGRASTIM (peg fil GRA stim) is a long-acting granulocyte colony-stimulating factor that stimulates the growth of neutrophils, a type of white blood cell important in the body's fight against infection. It is used to reduce the incidence of fever and infection in patients with certain types of cancer who are receiving chemotherapy that affects the bone marrow. This medicine may be used for other purposes; ask your health care provider or pharmacist if you have questions. COMMON BRAND NAME(S): Neulasta What should I tell my health care provider before I take this medicine? They need to know if you have any of these conditions: -latex allergy -ongoing radiation therapy -sickle cell disease -skin reactions to acrylic adhesives (On-Body Injector only) -an unusual or allergic reaction to pegfilgrastim, filgrastim, other medicines, foods, dyes, or preservatives -pregnant or trying to get pregnant -breast-feeding How should I use this medicine? This medicine is for injection under the skin. If you get this medicine at home, you will be taught how to prepare and give the pre-filled syringe or how to use the On-body Injector. Refer to the patient Instructions for Use for detailed instructions. Use exactly as directed. Take your medicine at regular intervals. Do not take your medicine more often than directed. It is important that you put your used needles and syringes in a special sharps container. Do not put them in a trash can. If you do not have a sharps container, call your pharmacist or healthcare provider to get one. Talk to your pediatrician regarding the use of this medicine in children. Special care may be needed. Overdosage: If you think you have taken too much of this medicine contact a poison control center or emergency room at once. NOTE: This medicine is only for you. Do not share this medicine with others. What if I miss a dose? It is  important not to miss your dose. Call your doctor or health care professional if you miss your dose. If you miss a dose due to an On-body Injector failure or leakage, a new dose should be administered as soon as possible using a single prefilled syringe for manual use. What may interact with this medicine? Interactions have not been studied. Give your health care provider a list of all the medicines, herbs, non-prescription drugs, or dietary supplements you use. Also tell them if you smoke, drink alcohol, or use illegal drugs. Some items may interact with your medicine. This list may not describe all possible interactions. Give your health care provider a list of all the medicines, herbs, non-prescription drugs, or dietary supplements you use. Also tell them if you smoke, drink alcohol, or use illegal drugs. Some items may interact with your medicine. What should I watch for while using this medicine? You may need blood work done while you are taking this medicine. If you are going to need a MRI, CT scan, or other procedure, tell your doctor that you are using this medicine (On-Body Injector only). What side effects may I notice from receiving this medicine? Side effects that you should report to your doctor or health care professional as soon as possible: -allergic reactions like skin rash, itching or hives, swelling of the face, lips, or tongue -dizziness -fever -pain, redness, or irritation at site where injected -pinpoint red spots on the skin -shortness of breath or breathing problems -stomach or side pain, or pain at the shoulder -swelling -tiredness -trouble passing urine Side effects that usually do not require medical attention (report to your doctor   or health care professional if they continue or are bothersome): -bone pain -muscle pain This list may not describe all possible side effects. Call your doctor for medical advice about side effects. You may report side effects to FDA at  1-800-FDA-1088. Where should I keep my medicine? Keep out of the reach of children. Store pre-filled syringes in a refrigerator between 2 and 8 degrees C (36 and 46 degrees F). Do not freeze. Keep in carton to protect from light. Throw away this medicine if it is left out of the refrigerator for more than 48 hours. Throw away any unused medicine after the expiration date. NOTE: This sheet is a summary. It may not cover all possible information. If you have questions about this medicine, talk to your doctor, pharmacist, or health care provider.  2015, [employer]/Gold Standard. (2013-04-22 16:14:05)  

## 2013-09-11 ENCOUNTER — Other Ambulatory Visit: Payer: Self-pay | Admitting: Family Medicine

## 2013-09-13 ENCOUNTER — Other Ambulatory Visit

## 2013-09-13 ENCOUNTER — Ambulatory Visit: Admitting: Nurse Practitioner

## 2013-09-13 ENCOUNTER — Ambulatory Visit

## 2013-09-15 ENCOUNTER — Encounter

## 2013-09-17 ENCOUNTER — Other Ambulatory Visit: Payer: Self-pay | Admitting: *Deleted

## 2013-09-17 DIAGNOSIS — C259 Malignant neoplasm of pancreas, unspecified: Secondary | ICD-10-CM

## 2013-09-17 MED ORDER — MORPHINE SULFATE ER 30 MG PO TBCR: 30.0000 mg | EXTENDED_RELEASE_TABLET | Freq: Two times a day (BID) | ORAL | Status: DC

## 2013-09-17 MED ORDER — OXYCODONE-ACETAMINOPHEN 10-325 MG PO TABS: ORAL_TABLET | ORAL | Status: DC

## 2013-09-17 MED ORDER — PROCHLORPERAZINE MALEATE 10 MG PO TABS: 10.0000 mg | ORAL_TABLET | Freq: Four times a day (QID) | ORAL | Status: DC | PRN

## 2013-09-17 NOTE — Telephone Encounter (Signed)
Patient called requesting refills on his antiemetic and pain medications. Confirmed they are within date.

## 2013-09-17 NOTE — Telephone Encounter (Signed)
Called to report his scripts are ready for pick up.

## 2013-09-19 ENCOUNTER — Other Ambulatory Visit: Payer: Self-pay | Admitting: Oncology

## 2013-09-21 ENCOUNTER — Telehealth: Payer: Self-pay | Admitting: Dietician

## 2013-09-21 ENCOUNTER — Ambulatory Visit (HOSPITAL_BASED_OUTPATIENT_CLINIC_OR_DEPARTMENT_OTHER): Admitting: Physician Assistant

## 2013-09-21 ENCOUNTER — Other Ambulatory Visit (HOSPITAL_BASED_OUTPATIENT_CLINIC_OR_DEPARTMENT_OTHER)

## 2013-09-21 ENCOUNTER — Ambulatory Visit (HOSPITAL_BASED_OUTPATIENT_CLINIC_OR_DEPARTMENT_OTHER)

## 2013-09-21 ENCOUNTER — Encounter: Payer: Self-pay | Admitting: Physician Assistant

## 2013-09-21 VITALS — BP 136/83 | HR 92 | Temp 98.9°F | Resp 18 | Ht 70.0 in | Wt 159.9 lb

## 2013-09-21 DIAGNOSIS — I82890 Acute embolism and thrombosis of other specified veins: Secondary | ICD-10-CM

## 2013-09-21 DIAGNOSIS — Z5111 Encounter for antineoplastic chemotherapy: Secondary | ICD-10-CM

## 2013-09-21 DIAGNOSIS — G893 Neoplasm related pain (acute) (chronic): Secondary | ICD-10-CM

## 2013-09-21 DIAGNOSIS — C259 Malignant neoplasm of pancreas, unspecified: Secondary | ICD-10-CM

## 2013-09-21 DIAGNOSIS — C257 Malignant neoplasm of other parts of pancreas: Secondary | ICD-10-CM

## 2013-09-21 DIAGNOSIS — I10 Essential (primary) hypertension: Secondary | ICD-10-CM

## 2013-09-21 LAB — CBC WITH DIFFERENTIAL/PLATELET
BASO%: 0.7 % (ref 0.0–2.0)
Basophils Absolute: 0.1 10*3/uL (ref 0.0–0.1)
EOS%: 0.1 % (ref 0.0–7.0)
Eosinophils Absolute: 0 10*3/uL (ref 0.0–0.5)
HCT: 34.3 % — ABNORMAL LOW (ref 38.4–49.9)
HGB: 11.1 g/dL — ABNORMAL LOW (ref 13.0–17.1)
LYMPH#: 1.7 10*3/uL (ref 0.9–3.3)
LYMPH%: 15.1 % (ref 14.0–49.0)
MCH: 27.7 pg (ref 27.2–33.4)
MCHC: 32.3 g/dL (ref 32.0–36.0)
MCV: 85.9 fL (ref 79.3–98.0)
MONO#: 0.5 10*3/uL (ref 0.1–0.9)
MONO%: 4.4 % (ref 0.0–14.0)
NEUT#: 9.2 10*3/uL — ABNORMAL HIGH (ref 1.5–6.5)
NEUT%: 79.7 % — ABNORMAL HIGH (ref 39.0–75.0)
Platelets: 169 10*3/uL (ref 140–400)
RBC: 4 10*6/uL — ABNORMAL LOW (ref 4.20–5.82)
RDW: 17.8 % — AB (ref 11.0–14.6)
WBC: 11.5 10*3/uL — AB (ref 4.0–10.3)

## 2013-09-21 LAB — COMPREHENSIVE METABOLIC PANEL (CC13)
ALT: 14 U/L (ref 0–55)
AST: 14 U/L (ref 5–34)
Albumin: 3.3 g/dL — ABNORMAL LOW (ref 3.5–5.0)
Alkaline Phosphatase: 136 U/L (ref 40–150)
Anion Gap: 10 mEq/L (ref 3–11)
BUN: 10.1 mg/dL (ref 7.0–26.0)
CALCIUM: 9 mg/dL (ref 8.4–10.4)
CHLORIDE: 106 meq/L (ref 98–109)
CO2: 27 mEq/L (ref 22–29)
CREATININE: 1 mg/dL (ref 0.7–1.3)
Glucose: 128 mg/dl (ref 70–140)
POTASSIUM: 4.5 meq/L (ref 3.5–5.1)
Sodium: 143 mEq/L (ref 136–145)
TOTAL PROTEIN: 6.3 g/dL — AB (ref 6.4–8.3)
Total Bilirubin: 0.27 mg/dL (ref 0.20–1.20)

## 2013-09-21 LAB — CANCER ANTIGEN 19-9: CA 19-9: 15327.4 U/mL — ABNORMAL HIGH (ref <35.0)

## 2013-09-21 MED ORDER — ATROPINE SULFATE 1 MG/ML IJ SOLN
0.5000 mg | Freq: Once | INTRAMUSCULAR | Status: AC | PRN
  Administered 2013-09-21: 0.5 mg via INTRAVENOUS

## 2013-09-21 MED ORDER — DEXTROSE 5 % IV SOLN
Freq: Once | INTRAVENOUS | Status: AC
  Administered 2013-09-21: 11:00:00 via INTRAVENOUS

## 2013-09-21 MED ORDER — OXALIPLATIN CHEMO INJECTION 100 MG/20ML
85.0000 mg/m2 | Freq: Once | INTRAVENOUS | Status: AC
  Administered 2013-09-21: 165 mg via INTRAVENOUS
  Filled 2013-09-21: qty 33

## 2013-09-21 MED ORDER — LEUCOVORIN CALCIUM INJECTION 350 MG
400.0000 mg/m2 | Freq: Once | INTRAVENOUS | Status: AC
  Administered 2013-09-21: 788 mg via INTRAVENOUS
  Filled 2013-09-21: qty 39.4

## 2013-09-21 MED ORDER — PALONOSETRON HCL INJECTION 0.25 MG/5ML
INTRAVENOUS | Status: AC
  Filled 2013-09-21: qty 5

## 2013-09-21 MED ORDER — IRINOTECAN HCL CHEMO INJECTION 100 MG/5ML
140.0000 mg/m2 | Freq: Once | INTRAVENOUS | Status: AC
  Administered 2013-09-21: 276 mg via INTRAVENOUS
  Filled 2013-09-21: qty 13.8

## 2013-09-21 MED ORDER — SODIUM CHLORIDE 0.9 % IV SOLN
150.0000 mg | Freq: Once | INTRAVENOUS | Status: AC
  Administered 2013-09-21: 150 mg via INTRAVENOUS
  Filled 2013-09-21: qty 5

## 2013-09-21 MED ORDER — ATROPINE SULFATE 1 MG/ML IJ SOLN
INTRAMUSCULAR | Status: AC
  Filled 2013-09-21: qty 1

## 2013-09-21 MED ORDER — DEXAMETHASONE SODIUM PHOSPHATE 20 MG/5ML IJ SOLN
12.0000 mg | Freq: Once | INTRAMUSCULAR | Status: AC
  Administered 2013-09-21: 12 mg via INTRAVENOUS

## 2013-09-21 MED ORDER — PALONOSETRON HCL INJECTION 0.25 MG/5ML
0.2500 mg | Freq: Once | INTRAVENOUS | Status: AC
  Administered 2013-09-21: 0.25 mg via INTRAVENOUS

## 2013-09-21 MED ORDER — SODIUM CHLORIDE 0.9 % IV SOLN
2400.0000 mg/m2 | INTRAVENOUS | Status: DC
  Administered 2013-09-21: 4750 mg via INTRAVENOUS
  Filled 2013-09-21: qty 95

## 2013-09-21 MED ORDER — DEXAMETHASONE SODIUM PHOSPHATE 20 MG/5ML IJ SOLN
INTRAMUSCULAR | Status: AC
  Filled 2013-09-21: qty 5

## 2013-09-21 MED ORDER — SODIUM CHLORIDE 0.9 % IV SOLN
Freq: Once | INTRAVENOUS | Status: AC
  Administered 2013-09-21: 10:00:00 via INTRAVENOUS

## 2013-09-21 NOTE — Telephone Encounter (Signed)
Brief Outpatient Oncology Nutrition Note  Patient has been identified to be at risk on malnutrition screen.  Wt Readings from Last 10 Encounters:  09/21/13 159 lb 14.4 oz (72.53 kg)  09/06/13 162 lb (73.483 kg)  09/01/13 156 lb 9.6 oz (71.033 kg)  08/30/13 151 lb 8 oz (68.72 kg)  08/24/13 167 lb 1.6 oz (75.796 kg)  08/16/13 166 lb 8 oz (75.524 kg)  08/03/13 172 lb (78.019 kg)  08/03/13 172 lb (78.019 kg)  07/30/13 172 lb 9.6 oz (78.291 kg)  07/20/13 187 lb (84.823 kg)      Dx:  Pancreas Cancer  Called patient due to weight loss.  Patient with newly diagnosed cancer in June.  Patient states that he is now regaining weight.  Drinks 1-2 Ensure Daily.    Will mail handout on increasing calories and protein along with coupons for Ensure and the contact information for the [place] [facility].  Antonieta Iba, RD, LDN

## 2013-09-21 NOTE — Progress Notes (Signed)
[place] OFFICE PROGRESS NOTE   Diagnosis:  Pancreas cancer.  INTERVAL HISTORY:   Ray [name] returns as scheduled. He was hospitalized 08/24/2013 through 08/27/2013 with poorly controlled nausea/vomiting. He also had some diarrhea. He continues to have nausea. He is taking Compazine and Zofran. When he was evaluated on 08/31/1998 seen by [name] nurse practitioner he was advised to take dexamethasone beginning day 2 after chemotherapy for an additional 3 days. He found this to be helpful in controlling his nausea and vomiting symptoms. He requests a refill for the dexamethasone.. Today he feels well and is ready to proceed with his next cycle of chemotherapy. No further vomiting. Denies hand or foot pain or redness. The cold sensitivity typically lasts for "a few days" following chemotherapy. He denies persistent neuropathy symptoms. Abdominal pain continues to be improved. He continues MS Contin with Percocet as needed.   Objective:  Vital signs in last 24 hours:  Blood pressure 136/83, pulse 92, temperature 98.9 F (37.2 C), temperature source Oral, resp. rate 18, height 5\' 10"  (1.778 m), weight 159 lb 14.4 oz (72.53 kg), SpO2 100.00%.    HEENT: No thrush or ulcerations. Resp: Lungs are clear. Cardio: Regular cardiac rhythm. GI: Abdomen is soft. No hepatomegaly. No mass. Decrease tenderness at the left upper abdomen. Vascular: No leg edema. Neuro: Vibratory sense intact to minimally decreased over the fingertips per tuning fork exam.   Port-A-Cath site without erythema.    Lab Results:  Lab Results  Component Value Date   WBC 11.5* 09/21/2013   HGB 11.1* 09/21/2013   HCT 34.3* 09/21/2013   MCV 85.9 09/21/2013   PLT 169 09/21/2013   NEUTROABS 9.2* 09/21/2013    Imaging:  No results found.  Medications: I have reviewed the patient's current medications.  Assessment/Plan: 1. Pancreas cancer. 07/06/2013 ,CA 19-9- 2067.  CT abdomen/pelvis 07/09/2013 with  interval development of amorphous soft tissue circumferentially encasing the celiac axis and tracking caudally down toward the SMA. Associated stable irregular mild distention of the main pancreatic duct in the body and tail of the pancreas with no discrete mass lesion seen. 2.2 x 1.4 cm portacaval lymph node in the hepatoduodenal ligament had increased in size from 1.3 x 1.1 cm previously. Other scattered small lymph nodes seen in the hepatoduodenal ligament.  Endoscopic ultrasound 07/15/2013 with a very vague masslike lesion in the region of the uncinate pancreas. Fine needle aspiration showed atypical cells consistent with adenocarcinoma.  PET scan 07/29/2013 with hypermetabolic soft tissue posterior to the pancreas with an adjacent hypermetabolic lymph node, no hypermetabolic pancreas mass, no evidence of distant metastatic disease  Cycle 1 FOLFIRINOX 08/02/2013. Cycle 2 FOLFIRINOX 08/16/2013. 2. Abdominal pain secondary to #1. 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 Hypertension. 4. History of gout. 5. Nausea and vomiting following FOLFIRINOX chemotherapy.   Aloxi and Emend added with cycle 2.   Decadron added with cycle 3. 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 7. Hospitalization 08/24/2013 through 08/27/2013 with poorly controlled nausea/vomiting. Diarrhea during the hospitalization.    Disposition: Ray [name] has completed 3 cycles of FOLFIRINOX. He was hospitalized with nausea/vomiting following cycle 2. We will continue Decadron 8 mg twice daily for 3 days with cycle 3 beginning on day 2.   S. labs were reviewed all are within treatable range. He is not neutropenic today. We will continue with Neulasta  added on the day of pump discontinuation beginning with cycle 3. He tolerated the first injection without difficulty.   We will  see him in followup on 10/04/2013 which will be prior to cycle 5. He will contact the office in the interim with any problems.  Plan reviewed  with Dr. Benay [name]. 25 minutes were spent face-to-face at today's visit with the majority of that time involved in counseling/coordination of care.  Ray [name] E PA-C  09/21/2013  1:27 PM

## 2013-09-22 ENCOUNTER — Ambulatory Visit

## 2013-09-23 ENCOUNTER — Ambulatory Visit (HOSPITAL_BASED_OUTPATIENT_CLINIC_OR_DEPARTMENT_OTHER)

## 2013-09-23 ENCOUNTER — Ambulatory Visit

## 2013-09-23 VITALS — BP 132/81 | HR 74 | Temp 98.6°F

## 2013-09-23 DIAGNOSIS — C257 Malignant neoplasm of other parts of pancreas: Secondary | ICD-10-CM

## 2013-09-23 DIAGNOSIS — Z5189 Encounter for other specified aftercare: Secondary | ICD-10-CM

## 2013-09-23 DIAGNOSIS — C259 Malignant neoplasm of pancreas, unspecified: Secondary | ICD-10-CM

## 2013-09-23 MED ORDER — HEPARIN SOD (PORK) LOCK FLUSH 100 UNIT/ML IV SOLN
500.0000 [IU] | Freq: Once | INTRAVENOUS | Status: AC | PRN
  Administered 2013-09-23: 500 [IU]
  Filled 2013-09-23: qty 5

## 2013-09-23 MED ORDER — SODIUM CHLORIDE 0.9 % IJ SOLN
10.0000 mL | INTRAMUSCULAR | Status: DC | PRN
  Administered 2013-09-23: 10 mL
  Filled 2013-09-23: qty 10

## 2013-09-23 MED ORDER — PEGFILGRASTIM INJECTION 6 MG/0.6ML
6.0000 mg | Freq: Once | SUBCUTANEOUS | Status: AC
  Administered 2013-09-23: 6 mg via SUBCUTANEOUS
  Filled 2013-09-23: qty 0.6

## 2013-09-23 NOTE — Patient Instructions (Signed)
Continue labs and chemotherapy as scheduled  Follow up in 2 weeks 

## 2013-09-23 NOTE — Patient Instructions (Signed)

## 2013-09-24 ENCOUNTER — Ambulatory Visit: Admitting: Family Medicine

## 2013-09-24 ENCOUNTER — Telehealth: Payer: Self-pay | Admitting: Family Medicine

## 2013-09-24 NOTE — Telephone Encounter (Signed)
Called pt to apologize that we had to reschedule today's appointment due to my having a continuity delivery. He was appreciative of the phone call. His wife Ray [name] will be able to come to the rescheduled appointment on Monday. I look forward to seeing them both then.  Leeanne Rio, MD

## 2013-09-24 NOTE — Progress Notes (Signed)
Pump start stop done in inj room.

## 2013-09-27 ENCOUNTER — Encounter: Payer: Self-pay | Admitting: Family Medicine

## 2013-09-27 ENCOUNTER — Ambulatory Visit: Admitting: Oncology

## 2013-09-27 ENCOUNTER — Other Ambulatory Visit

## 2013-09-27 ENCOUNTER — Ambulatory Visit

## 2013-09-27 ENCOUNTER — Ambulatory Visit (INDEPENDENT_AMBULATORY_CARE_PROVIDER_SITE_OTHER): Admitting: Family Medicine

## 2013-09-27 VITALS — BP 110/76 | HR 96 | Temp 98.2°F | Ht 70.0 in | Wt 157.3 lb

## 2013-09-27 DIAGNOSIS — I10 Essential (primary) hypertension: Secondary | ICD-10-CM

## 2013-09-27 NOTE — Patient Instructions (Signed)
It was great to see you again today!  Stop the amlodipine. Let's see how you do off of it. The goal for your blood pressure is <140/90.  I will see you in 3 weeks to recheck your BP and see how you are doing. Call with any problems or concerns in the mean time.  Be well, Dr. [name]

## 2013-09-29 ENCOUNTER — Encounter

## 2013-09-29 NOTE — Assessment & Plan Note (Signed)
Improvement in ligthheadedness after stopping losartan. BP remains well below goal. Will do trial off of amlodipine to see if he still needs this medicine. Suspect he will not, mostly due to how much weight he has lost with his pancreatic cancer. I will see him back in about 3 weeks to recheck his BP and check in with how he's doing.

## 2013-09-29 NOTE — Progress Notes (Signed)
Patient ID: Ray [name], male   DOB: 1955/02/22, 58 y.o.   MRN: [id number]  HPI:  At last visit we stopped losartan as he was symptomatically hypotensive. He's been doing well since that time. Continues to undergo infusions for his pancreatic cancer. His nausea and vomiting have improved immensely with the addition of decadron to his regimen. He is wondering now if he could stop his amlodipine. No chest pain or SOB.  ROS: See HPI.  [name]: hx GERD, chronic pain, actively undergoing treatment for pancreatic cancer  PHYSICAL EXAM: BP 110/76  Pulse 96  Temp(Src) 98.2 F (36.8 C) (Oral)  Ht 5\' 10"  (1.778 m)  Wt 157 lb 4.8 oz (71.351 kg)  BMI 22.57 kg/m2 Gen: NAD HEENT: NCAT Heart: RRR, no murmurs Lungs: CTAB, NWOB Neuro: grossly nonfocal, speech normal  ASSESSMENT/PLAN:  Hypertension Improvement in ligthheadedness after stopping losartan. BP remains well below goal. Will do trial off of amlodipine to see if he still needs this medicine. Suspect he will not, mostly due to how much weight he has lost with his pancreatic cancer. I will see him back in about 3 weeks to recheck his BP and check in with how he's doing.   FOLLOW UP: F/u in 3 weeks for HTN.  [place]. [name], [place]

## 2013-10-03 ENCOUNTER — Other Ambulatory Visit: Payer: Self-pay | Admitting: Oncology

## 2013-10-04 ENCOUNTER — Other Ambulatory Visit

## 2013-10-04 ENCOUNTER — Other Ambulatory Visit (HOSPITAL_BASED_OUTPATIENT_CLINIC_OR_DEPARTMENT_OTHER)

## 2013-10-04 ENCOUNTER — Ambulatory Visit (HOSPITAL_BASED_OUTPATIENT_CLINIC_OR_DEPARTMENT_OTHER)

## 2013-10-04 ENCOUNTER — Ambulatory Visit (HOSPITAL_BASED_OUTPATIENT_CLINIC_OR_DEPARTMENT_OTHER): Admitting: Nurse Practitioner

## 2013-10-04 ENCOUNTER — Telehealth: Payer: Self-pay | Admitting: Oncology

## 2013-10-04 VITALS — BP 137/80 | HR 69 | Temp 98.5°F | Resp 18 | Ht 70.0 in | Wt 166.6 lb

## 2013-10-04 DIAGNOSIS — G893 Neoplasm related pain (acute) (chronic): Secondary | ICD-10-CM

## 2013-10-04 DIAGNOSIS — C257 Malignant neoplasm of other parts of pancreas: Secondary | ICD-10-CM

## 2013-10-04 DIAGNOSIS — C259 Malignant neoplasm of pancreas, unspecified: Secondary | ICD-10-CM

## 2013-10-04 DIAGNOSIS — Z5111 Encounter for antineoplastic chemotherapy: Secondary | ICD-10-CM

## 2013-10-04 LAB — CBC WITH DIFFERENTIAL/PLATELET
BASO%: 0.2 % (ref 0.0–2.0)
BASOS ABS: 0.1 10*3/uL (ref 0.0–0.1)
EOS%: 0 % (ref 0.0–7.0)
Eosinophils Absolute: 0 10*3/uL (ref 0.0–0.5)
HCT: 33.2 % — ABNORMAL LOW (ref 38.4–49.9)
HGB: 10.7 g/dL — ABNORMAL LOW (ref 13.0–17.1)
LYMPH%: 12 % — ABNORMAL LOW (ref 14.0–49.0)
MCH: 27.8 pg (ref 27.2–33.4)
MCHC: 32.2 g/dL (ref 32.0–36.0)
MCV: 86.2 fL (ref 79.3–98.0)
MONO#: 1.3 10*3/uL — ABNORMAL HIGH (ref 0.1–0.9)
MONO%: 6.2 % (ref 0.0–14.0)
NEUT#: 17.4 10*3/uL — ABNORMAL HIGH (ref 1.5–6.5)
NEUT%: 81.6 % — ABNORMAL HIGH (ref 39.0–75.0)
Platelets: 144 10*3/uL (ref 140–400)
RBC: 3.85 10*6/uL — ABNORMAL LOW (ref 4.20–5.82)
RDW: 20.3 % — ABNORMAL HIGH (ref 11.0–14.6)
WBC: 21.4 10*3/uL — ABNORMAL HIGH (ref 4.0–10.3)
lymph#: 2.6 10*3/uL (ref 0.9–3.3)

## 2013-10-04 LAB — COMPREHENSIVE METABOLIC PANEL (CC13)
ALBUMIN: 3.3 g/dL — AB (ref 3.5–5.0)
ALK PHOS: 170 U/L — AB (ref 40–150)
ALT: 22 U/L (ref 0–55)
ANION GAP: 11 meq/L (ref 3–11)
AST: 18 U/L (ref 5–34)
BUN: 10.3 mg/dL (ref 7.0–26.0)
CO2: 27 mEq/L (ref 22–29)
Calcium: 8.7 mg/dL (ref 8.4–10.4)
Chloride: 107 mEq/L (ref 98–109)
Creatinine: 0.8 mg/dL (ref 0.7–1.3)
GLUCOSE: 107 mg/dL (ref 70–140)
POTASSIUM: 3.3 meq/L — AB (ref 3.5–5.1)
Sodium: 145 mEq/L (ref 136–145)
Total Bilirubin: 0.44 mg/dL (ref 0.20–1.20)
Total Protein: 6.1 g/dL — ABNORMAL LOW (ref 6.4–8.3)

## 2013-10-04 LAB — CANCER ANTIGEN 19-9: CA 19 9: 8376 U/mL — AB (ref <35.0)

## 2013-10-04 MED ORDER — LEUCOVORIN CALCIUM INJECTION 350 MG
400.0000 mg/m2 | Freq: Once | INTRAVENOUS | Status: AC
  Administered 2013-10-04: 800 mg via INTRAVENOUS
  Filled 2013-10-04: qty 40

## 2013-10-04 MED ORDER — ATROPINE SULFATE 1 MG/ML IJ SOLN
INTRAMUSCULAR | Status: AC
  Filled 2013-10-04: qty 1

## 2013-10-04 MED ORDER — SODIUM CHLORIDE 0.9 % IV SOLN
150.0000 mg | Freq: Once | INTRAVENOUS | Status: AC
  Administered 2013-10-04: 150 mg via INTRAVENOUS
  Filled 2013-10-04: qty 5

## 2013-10-04 MED ORDER — PALONOSETRON HCL INJECTION 0.25 MG/5ML
0.2500 mg | Freq: Once | INTRAVENOUS | Status: AC
  Administered 2013-10-04: 0.25 mg via INTRAVENOUS

## 2013-10-04 MED ORDER — MORPHINE SULFATE ER 30 MG PO TBCR: 30.0000 mg | EXTENDED_RELEASE_TABLET | Freq: Two times a day (BID) | ORAL | Status: DC

## 2013-10-04 MED ORDER — DEXAMETHASONE SODIUM PHOSPHATE 20 MG/5ML IJ SOLN
12.0000 mg | Freq: Once | INTRAMUSCULAR | Status: AC
  Administered 2013-10-04: 12 mg via INTRAVENOUS

## 2013-10-04 MED ORDER — SODIUM CHLORIDE 0.9 % IV SOLN
2400.0000 mg/m2 | INTRAVENOUS | Status: DC
  Administered 2013-10-04: 4750 mg via INTRAVENOUS
  Filled 2013-10-04: qty 95

## 2013-10-04 MED ORDER — DEXAMETHASONE SODIUM PHOSPHATE 20 MG/5ML IJ SOLN
INTRAMUSCULAR | Status: AC
  Filled 2013-10-04: qty 5

## 2013-10-04 MED ORDER — ATROPINE SULFATE 1 MG/ML IJ SOLN
0.5000 mg | Freq: Once | INTRAMUSCULAR | Status: AC | PRN
  Administered 2013-10-04: 0.5 mg via INTRAVENOUS

## 2013-10-04 MED ORDER — IRINOTECAN HCL CHEMO INJECTION 100 MG/5ML
140.0000 mg/m2 | Freq: Once | INTRAVENOUS | Status: AC
  Administered 2013-10-04: 276 mg via INTRAVENOUS
  Filled 2013-10-04: qty 13.8

## 2013-10-04 MED ORDER — PALONOSETRON HCL INJECTION 0.25 MG/5ML
INTRAVENOUS | Status: AC
  Filled 2013-10-04: qty 5

## 2013-10-04 MED ORDER — DEXTROSE 5 % IV SOLN
Freq: Once | INTRAVENOUS | Status: AC
  Administered 2013-10-04: 12:00:00 via INTRAVENOUS

## 2013-10-04 MED ORDER — OXALIPLATIN CHEMO INJECTION 100 MG/20ML
85.0000 mg/m2 | Freq: Once | INTRAVENOUS | Status: AC
  Administered 2013-10-04: 165 mg via INTRAVENOUS
  Filled 2013-10-04: qty 33

## 2013-10-04 MED ORDER — OXYCODONE-ACETAMINOPHEN 10-325 MG PO TABS: ORAL_TABLET | ORAL | Status: DC

## 2013-10-04 NOTE — Progress Notes (Addendum)
[name] OFFICE PROGRESS NOTE   Diagnosis:   Pancreas cancer.  INTERVAL HISTORY:   [name] returns as scheduled. He completed cycle 4 FOLFIRINOX on 09/21/2013. He had minimal nausea. No mouth sores. He estimates one loose stool a day. The cold sensitivity lasted 3-4 days. He denies persistent neuropathy symptoms. He notes that he develops hiccups after taking the dexamethasone. He tried baclofen with no improvement. The hiccups tend to last 3-4 days. Abdominal pain is significantly improved as compared to prechemotherapy. He is currently taking MS Contin 30 mg every 12 hours with Percocet as needed.  Objective:  Vital signs in last 24 hours:  Blood pressure 137/80, pulse 69, temperature 98.5 F (36.9 C), temperature source Oral, resp. rate 18, height 5\' 10"  (1.778 m), weight 166 lb 9.6 oz (75.569 kg), SpO2 100.00%.    HEENT: No thrush or ulcers. Resp: Lungs clear bilaterally. Cardio: Regular rate and rhythm. GI: Abdomen soft and nontender. No hepatomegaly. No mass. Vascular: No leg edema. Neuro: Vibratory sense mildly decreased over the fingertips per tuning fork exam.   Port-A-Cath site without erythema.    Lab Results:  Lab Results  Component Value Date   WBC 21.4* 10/04/2013   HGB 10.7* 10/04/2013   HCT 33.2* 10/04/2013   MCV 86.2 10/04/2013   PLT 144 10/04/2013   NEUTROABS 17.4* 10/04/2013    Imaging:  No results found.  Medications: I have reviewed the patient's current medications.  Assessment/Plan: 1. Pancreas cancer. 07/06/2013 ,CA 19-9- 2067.  CT abdomen/pelvis 07/09/2013 with interval development of amorphous soft tissue circumferentially encasing the celiac axis and tracking caudally down toward the SMA. Associated stable irregular mild distention of the main pancreatic duct in the body and tail of the pancreas with no discrete mass lesion seen. 2.2 x 1.4 cm portacaval lymph node in the hepatoduodenal ligament had increased in size from 1.3 x  1.1 cm previously. Other scattered small lymph nodes seen in the hepatoduodenal ligament.  Endoscopic ultrasound 07/15/2013 with a very vague masslike lesion in the region of the uncinate pancreas. Fine needle aspiration showed atypical cells consistent with adenocarcinoma.  PET scan 07/29/2013 with hypermetabolic soft tissue posterior to the pancreas with an adjacent hypermetabolic lymph node, no hypermetabolic pancreas mass, no evidence of distant metastatic disease  Cycle 1 FOLFIRINOX 08/02/2013.  Cycle 2 FOLFIRINOX 08/16/2013. Cycle 3 FOLFIRINOX 09/06/2013. Cycle 4 FOLFIRINOX 09/21/2013. 2. Abdominal pain secondary to #1. 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 3. Hypertension. 4. History of gout. 5. Nausea and vomiting following FOLFIRINOX chemotherapy.  Aloxi and Emend added with cycle 2.  Decadron added with cycle 3. 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 7. Hospitalization 08/24/2013 through 08/27/2013 with poorly controlled nausea/vomiting. Diarrhea during the hospitalization.  Disposition: [name] appears stable. He has completed 4 cycles of FOLFIRINOX. We will followup on the CA 19-9 from today. Plan to proceed with cycle 5 today as scheduled. Neulasta will be held with this cycle due to the elevated white count. He will return for a followup visit and cycle 6 in 2 weeks. The plan is for a restaging CT evaluation (pancreatic protocol) after completion of 6 cycles.  He will contact the office prior to his next visit with any problems.  We made a referral to Dr. Barry Dienes, question resectable disease.   Patient seen with [name]. 25 minutes were spent face-to-face at today's visit with the majority of that time involved in counseling/coordination of care.    Ned Card ANP/GNP-BC   10/04/2013  11:06 AM    This was a shared visit with Ned Card. The plan is to continue FOLFIRINOX. His clinical status appears improved. We will followup on the CA19-9 from  today. We will refer him to Dr. Barry Dienes, though I doubt he will ever be a surgical candidate.  Julieanne Manson, M.D.

## 2013-10-04 NOTE — Patient Instructions (Signed)
[facility] Discharge Instructions for Patients Receiving Chemotherapy  Today you received the following chemotherapy agents leucovorin/fluorouracil/oxaliplatin/irinotecan.    To help prevent nausea and vomiting after your treatment, we encourage you to take your nausea medication as directed.  If you develop nausea and vomiting that is not controlled by your nausea medication, call the clinic.   BELOW ARE SYMPTOMS THAT SHOULD BE REPORTED IMMEDIATELY:  *FEVER GREATER THAN 100.5 F  *CHILLS WITH OR WITHOUT FEVER  NAUSEA AND VOMITING THAT IS NOT CONTROLLED WITH YOUR NAUSEA MEDICATION  *UNUSUAL SHORTNESS OF BREATH  *UNUSUAL BRUISING OR BLEEDING  TENDERNESS IN MOUTH AND THROAT WITH OR WITHOUT PRESENCE OF ULCERS  *URINARY PROBLEMS  *BOWEL PROBLEMS  UNUSUAL RASH Items with * indicate a potential emergency and should be followed up as soon as possible.  Feel free to call the clinic you have any questions or concerns. The clinic phone number is [phone].

## 2013-10-04 NOTE — Telephone Encounter (Signed)
gv adn printed appt sched and avs for pt for Sept...sed added tx. °

## 2013-10-06 ENCOUNTER — Ambulatory Visit

## 2013-10-06 ENCOUNTER — Ambulatory Visit (HOSPITAL_BASED_OUTPATIENT_CLINIC_OR_DEPARTMENT_OTHER)

## 2013-10-06 VITALS — BP 153/85 | HR 68

## 2013-10-06 DIAGNOSIS — C257 Malignant neoplasm of other parts of pancreas: Secondary | ICD-10-CM

## 2013-10-06 DIAGNOSIS — C259 Malignant neoplasm of pancreas, unspecified: Secondary | ICD-10-CM

## 2013-10-06 MED ORDER — SODIUM CHLORIDE 0.9 % IJ SOLN
10.0000 mL | INTRAMUSCULAR | Status: DC | PRN
  Administered 2013-10-06: 10 mL
  Filled 2013-10-06: qty 10

## 2013-10-06 MED ORDER — HEPARIN SOD (PORK) LOCK FLUSH 100 UNIT/ML IV SOLN
500.0000 [IU] | Freq: Once | INTRAVENOUS | Status: AC | PRN
  Administered 2013-10-06: 500 [IU]
  Filled 2013-10-06: qty 5

## 2013-10-06 NOTE — Patient Instructions (Signed)
Fluorouracil, 5FU; Diclofenac topical cream What is this medicine? FLUOROURACIL; DICLOFENAC (flure oh YOOR a sil; dye KLOE fen ak) is a combination of a topical chemotherapy agent and non-steroidal anti-inflammatory drug (NSAID). It is used on the skin to treat skin cancer and skin conditions that could become cancer. This medicine may be used for other purposes; ask your health care provider or pharmacist if you have questions. COMMON BRAND NAME(S): FLUORAC What should I tell my health care provider before I take this medicine? They need to know if you have any of these conditions: -bleeding problems -cigarette smoker -DPD enzyme deficiency -heart disease -high blood pressure -if you frequently drink alcohol containing drinks -kidney disease -liver disease -open or infected skin -stomach problems -swelling or open sores at the treatment site -recent or planned coronary artery bypass graft (CABG) surgery -an unusual or allergic reaction to fluorouracil, diclofenac, aspirin, other NSAIDs, other medicines, foods, dyes, or preservatives -pregnant or trying to get pregnant -breast-feeding How should I use this medicine? This medicine is only for use on the skin. Follow the directions on the prescription label. Wash hands before and after use. Wash affected area and gently pat dry. To apply this medicine use a cotton-tipped applicator, or use gloves if applying with fingertips. If applied with unprotected fingertips, it is very important to wash your hands well after you apply this medicine. Avoid applying to the eyes, nose, or mouth. Apply enough medicine to cover the affected area. You can cover the area with a light gauze dressing, but do not use tight or air-tight dressings. Finish the full course prescribed by your doctor or health care professional, even if you think your condition is better. Do not stop taking except on the advice of your doctor or health care professional. Talk to your  pediatrician regarding the use of this medicine in children. Special care may be needed. Overdosage: If you think you've taken too much of this medicine contact a poison control center or emergency room at once. Overdosage: If you think you have taken too much of this medicine contact a poison control center or emergency room at once. NOTE: This medicine is only for you. Do not share this medicine with others. What if I miss a dose? If you miss a dose, apply it as soon as you can. If it is almost time for your next dose, only use that dose. Do not apply extra doses. Contact your doctor or health care professional if you miss more than one dose. What may interact with this medicine? Interactions are not expected. Do not use any other skin products without telling your doctor or health care professional. This list may not describe all possible interactions. Give your health care provider a list of all the medicines, herbs, non-prescription drugs, or dietary supplements you use. Also tell them if you smoke, drink alcohol, or use illegal drugs. Some items may interact with your medicine. What should I watch for while using this medicine? Visit your doctor or health care professional for checks on your progress. You will need to use this medicine for 2 to 6 weeks. This may be longer depending on the condition being treated. You may not see full healing for another 1 to 2 months after you stop using the medicine. Treated areas of skin can look unsightly during and for several weeks after treatment with this medicine. This medicine can make you more sensitive to the sun. Keep out of the sun. If you cannot avoid being in   the sun, wear protective clothing and use sunscreen. Do not use sun lamps or tanning beds/booths. Where should I keep my What side effects may I notice from receiving this medicine? Side effects that you should report to your doctor or health care professional as soon as possible: -allergic  reactions like skin rash, itching or hives, swelling of the face, lips, or tongue -black or bloody stools, blood in the urine or vomit -blurred vision -chest pain -difficulty breathing or wheezing -redness, blistering, peeling or loosening of the skin, including inside the mouth -severe redness and swelling of normal skin -slurred speech or weakness on one side of the body -trouble passing urine or change in the amount of urine -unexplained weight gain or swelling -unusually weak or tired -yellowing of eyes or skin Side effects that usually do not require medical attention (Report these to your doctor or health care professional if they continue or are bothersome.): -increased sensitivity of the skin to sun and ultraviolet light -pain and burning of the affected area -scaling or swelling of the affected area -skin rash, itching of the affected area -tenderness This list may not describe all possible side effects. Call your doctor for medical advice about side effects. You may report side effects to FDA at 1-800-FDA-1088. Where should I keep my medicine? Keep out of the reach of children. Store at room temperature between 20 and 25 degrees C (68 and 77 degrees F). Throw away any unused medicine after the expiration date. NOTE: This sheet is a summary. It may not cover all possible information. If you have questions about this medicine, talk to your doctor, pharmacist, or health care provider.  2015, [employer]vier/Gold Standard. (2013-05-24 11:09:58)  

## 2013-10-17 ENCOUNTER — Other Ambulatory Visit: Payer: Self-pay | Admitting: Oncology

## 2013-10-18 ENCOUNTER — Other Ambulatory Visit (HOSPITAL_BASED_OUTPATIENT_CLINIC_OR_DEPARTMENT_OTHER)

## 2013-10-18 ENCOUNTER — Ambulatory Visit (HOSPITAL_BASED_OUTPATIENT_CLINIC_OR_DEPARTMENT_OTHER): Admitting: Nurse Practitioner

## 2013-10-18 ENCOUNTER — Telehealth: Payer: Self-pay | Admitting: Oncology

## 2013-10-18 ENCOUNTER — Ambulatory Visit (HOSPITAL_BASED_OUTPATIENT_CLINIC_OR_DEPARTMENT_OTHER)

## 2013-10-18 ENCOUNTER — Other Ambulatory Visit: Payer: Self-pay | Admitting: Nurse Practitioner

## 2013-10-18 VITALS — BP 141/87 | HR 72 | Temp 98.6°F | Resp 18 | Ht 70.0 in | Wt 165.3 lb

## 2013-10-18 DIAGNOSIS — C257 Malignant neoplasm of other parts of pancreas: Secondary | ICD-10-CM

## 2013-10-18 DIAGNOSIS — C259 Malignant neoplasm of pancreas, unspecified: Secondary | ICD-10-CM

## 2013-10-18 DIAGNOSIS — I10 Essential (primary) hypertension: Secondary | ICD-10-CM

## 2013-10-18 DIAGNOSIS — Z5111 Encounter for antineoplastic chemotherapy: Secondary | ICD-10-CM

## 2013-10-18 DIAGNOSIS — E876 Hypokalemia: Secondary | ICD-10-CM

## 2013-10-18 DIAGNOSIS — D709 Neutropenia, unspecified: Secondary | ICD-10-CM

## 2013-10-18 DIAGNOSIS — R197 Diarrhea, unspecified: Secondary | ICD-10-CM

## 2013-10-18 DIAGNOSIS — D696 Thrombocytopenia, unspecified: Secondary | ICD-10-CM

## 2013-10-18 LAB — CBC WITH DIFFERENTIAL/PLATELET
BASO%: 0.3 % (ref 0.0–2.0)
BASOS ABS: 0 10*3/uL (ref 0.0–0.1)
EOS ABS: 0 10*3/uL (ref 0.0–0.5)
EOS%: 0.6 % (ref 0.0–7.0)
HCT: 30.2 % — ABNORMAL LOW (ref 38.4–49.9)
HEMOGLOBIN: 9.7 g/dL — AB (ref 13.0–17.1)
LYMPH#: 1.4 10*3/uL (ref 0.9–3.3)
LYMPH%: 43.1 % (ref 14.0–49.0)
MCH: 27.5 pg (ref 27.2–33.4)
MCHC: 32.1 g/dL (ref 32.0–36.0)
MCV: 85.6 fL (ref 79.3–98.0)
MONO#: 0.6 10*3/uL (ref 0.1–0.9)
MONO%: 17.9 % — ABNORMAL HIGH (ref 0.0–14.0)
NEUT%: 38.1 % — ABNORMAL LOW (ref 39.0–75.0)
NEUTROS ABS: 1.2 10*3/uL — AB (ref 1.5–6.5)
Platelets: 109 10*3/uL — ABNORMAL LOW (ref 140–400)
RBC: 3.53 10*6/uL — ABNORMAL LOW (ref 4.20–5.82)
RDW: 18.5 % — AB (ref 11.0–14.6)
WBC: 3.1 10*3/uL — ABNORMAL LOW (ref 4.0–10.3)

## 2013-10-18 LAB — COMPREHENSIVE METABOLIC PANEL (CC13)
ALBUMIN: 3.1 g/dL — AB (ref 3.5–5.0)
ALT: 12 U/L (ref 0–55)
AST: 13 U/L (ref 5–34)
Alkaline Phosphatase: 106 U/L (ref 40–150)
Anion Gap: 11 mEq/L (ref 3–11)
BUN: 8.5 mg/dL (ref 7.0–26.0)
CHLORIDE: 108 meq/L (ref 98–109)
CO2: 24 mEq/L (ref 22–29)
Calcium: 8.4 mg/dL (ref 8.4–10.4)
Creatinine: 0.9 mg/dL (ref 0.7–1.3)
GLUCOSE: 108 mg/dL (ref 70–140)
POTASSIUM: 3.1 meq/L — AB (ref 3.5–5.1)
Sodium: 143 mEq/L (ref 136–145)
Total Bilirubin: 0.4 mg/dL (ref 0.20–1.20)
Total Protein: 5.8 g/dL — ABNORMAL LOW (ref 6.4–8.3)

## 2013-10-18 MED ORDER — ATROPINE SULFATE 1 MG/ML IJ SOLN
INTRAMUSCULAR | Status: AC
  Filled 2013-10-18: qty 1

## 2013-10-18 MED ORDER — PALONOSETRON HCL INJECTION 0.25 MG/5ML
0.2500 mg | Freq: Once | INTRAVENOUS | Status: AC
  Administered 2013-10-18: 0.25 mg via INTRAVENOUS

## 2013-10-18 MED ORDER — DEXAMETHASONE SODIUM PHOSPHATE 20 MG/5ML IJ SOLN
12.0000 mg | Freq: Once | INTRAMUSCULAR | Status: AC
  Administered 2013-10-18: 12 mg via INTRAVENOUS

## 2013-10-18 MED ORDER — DEXTROSE 5 % IV SOLN
Freq: Once | INTRAVENOUS | Status: AC
  Administered 2013-10-18: 13:00:00 via INTRAVENOUS

## 2013-10-18 MED ORDER — LEUCOVORIN CALCIUM INJECTION 350 MG
400.0000 mg/m2 | Freq: Once | INTRAVENOUS | Status: AC
  Administered 2013-10-18: 800 mg via INTRAVENOUS
  Filled 2013-10-18: qty 40

## 2013-10-18 MED ORDER — DEXTROSE 5 % IV SOLN
85.0000 mg/m2 | Freq: Once | INTRAVENOUS | Status: AC
  Administered 2013-10-18: 165 mg via INTRAVENOUS
  Filled 2013-10-18: qty 33

## 2013-10-18 MED ORDER — ATROPINE SULFATE 1 MG/ML IJ SOLN
0.5000 mg | Freq: Once | INTRAMUSCULAR | Status: AC | PRN
  Administered 2013-10-18: 16:00:00 via INTRAVENOUS

## 2013-10-18 MED ORDER — SODIUM CHLORIDE 0.9 % IV SOLN
2400.0000 mg/m2 | INTRAVENOUS | Status: DC
  Administered 2013-10-18: 4750 mg via INTRAVENOUS
  Filled 2013-10-18: qty 95

## 2013-10-18 MED ORDER — PALONOSETRON HCL INJECTION 0.25 MG/5ML
INTRAVENOUS | Status: AC
  Filled 2013-10-18: qty 5

## 2013-10-18 MED ORDER — IRINOTECAN HCL CHEMO INJECTION 100 MG/5ML
140.0000 mg/m2 | Freq: Once | INTRAVENOUS | Status: AC
  Administered 2013-10-18: 276 mg via INTRAVENOUS
  Filled 2013-10-18: qty 13.8

## 2013-10-18 MED ORDER — LOPERAMIDE HCL 2 MG PO CAPS: 2.0000 mg | ORAL_CAPSULE | ORAL | Status: DC | PRN

## 2013-10-18 MED ORDER — DEXAMETHASONE SODIUM PHOSPHATE 10 MG/ML IJ SOLN
INTRAMUSCULAR | Status: AC
  Filled 2013-10-18: qty 1

## 2013-10-18 MED ORDER — SODIUM CHLORIDE 0.9 % IV SOLN
150.0000 mg | Freq: Once | INTRAVENOUS | Status: AC
  Administered 2013-10-18: 150 mg via INTRAVENOUS
  Filled 2013-10-18: qty 5

## 2013-10-18 MED ORDER — DEXAMETHASONE SODIUM PHOSPHATE 20 MG/5ML IJ SOLN
INTRAMUSCULAR | Status: AC
  Filled 2013-10-18: qty 5

## 2013-10-18 NOTE — Patient Instructions (Signed)
Increase potassium to 20 meq twice daily for 3 days then resume once daily

## 2013-10-18 NOTE — Telephone Encounter (Signed)
gv and printed appt sched and avs for pt for Sept...gv pt barium °

## 2013-10-18 NOTE — Patient Instructions (Signed)
[facility] Discharge Instructions for Patients Receiving Chemotherapy  Today you received the following chemotherapy agents 5 FU, Camptosar, Irinotecan, Oxaliplatin  To help prevent nausea and vomiting after your treatment, we encourage you to take your nausea medication as needed   If you develop nausea and vomiting that is not controlled by your nausea medication, call the clinic.   BELOW ARE SYMPTOMS THAT SHOULD BE REPORTED IMMEDIATELY:  *FEVER GREATER THAN 100.5 F  *CHILLS WITH OR WITHOUT FEVER  NAUSEA AND VOMITING THAT IS NOT CONTROLLED WITH YOUR NAUSEA MEDICATION  *UNUSUAL SHORTNESS OF BREATH  *UNUSUAL BRUISING OR BLEEDING  TENDERNESS IN MOUTH AND THROAT WITH OR WITHOUT PRESENCE OF ULCERS  *URINARY PROBLEMS  *BOWEL PROBLEMS  UNUSUAL RASH Items with * indicate a potential emergency and should be followed up as soon as possible.  Feel free to call the clinic you have any questions or concerns. The clinic phone number is [phone].

## 2013-10-18 NOTE — Progress Notes (Signed)
[facility] OFFICE PROGRESS NOTE   Diagnosis:  Pancreas cancer.  INTERVAL HISTORY:   [name] returns as scheduled. He completed cycle 5 FOLFIRINOX on 10/04/2013. He denies significant nausea/vomiting. No mouth sores. He has noted increased loose stools for the past 2-3 weeks. On average he has one to 2 bowel movements a day. He takes Imodium as needed. He denies numbness or tingling in his hands or feet. He has a good appetite. He continues to have intermittent left-sided abdominal pain. He is currently on MS Contin 30 mg every 12 hours and takes Percocet as needed.  Objective:  Vital signs in last 24 hours:  Blood pressure 141/87, pulse 72, temperature 98.6 F (37 C), temperature source Oral, resp. rate 18, height 5\' 10"  (1.778 m), weight 165 lb 4.8 oz (74.98 kg).    HEENT: No thrush or ulcers. Resp: Lungs clear bilaterally. Cardio: Regular rate and rhythm. GI: Abdomen soft and nontender. No hepatomegaly. No mass. Vascular: No leg edema. Neuro: Vibratory sense mildly to moderately decreased over the fingertips per tuning fork exam.  Skin: Skin in general has a dry appearance. Hyperpigmentation and skin thickening over the palms. No skin breakdown.    Lab Results:  Lab Results  Component Value Date   WBC 3.1* 10/18/2013   HGB 9.7* 10/18/2013   HCT 30.2* 10/18/2013   MCV 85.6 10/18/2013   PLT 109* 10/18/2013   NEUTROABS 1.2* 10/18/2013    Imaging:  No results found.  Medications: I have reviewed the patient's current medications.  Assessment/Plan: 1. Pancreas cancer. 07/06/2013 ,CA 19-9- 2067.  CT abdomen/pelvis 07/09/2013 with interval development of amorphous soft tissue circumferentially encasing the celiac axis and tracking caudally down toward the SMA. Associated stable irregular mild distention of the main pancreatic duct in the body and tail of the pancreas with no discrete mass lesion seen. 2.2 x 1.4 cm portacaval lymph node in the hepatoduodenal  ligament had increased in size from 1.3 x 1.1 cm previously. Other scattered small lymph nodes seen in the hepatoduodenal ligament.  Endoscopic ultrasound 07/15/2013 with a very vague masslike lesion in the region of the uncinate pancreas. Fine needle aspiration showed atypical cells consistent with adenocarcinoma.  PET scan 07/29/2013 with hypermetabolic soft tissue posterior to the pancreas with an adjacent hypermetabolic lymph node, no hypermetabolic pancreas mass, no evidence of distant metastatic disease  Cycle 1 FOLFIRINOX 08/02/2013.  Cycle 2 FOLFIRINOX 08/16/2013.  Cycle 3 FOLFIRINOX 09/06/2013.  Cycle 4 FOLFIRINOX 09/21/2013. CA 19-9 improved on 10/04/2013. Cycle 5 FOLFIRINOX 10/04/2013. 2. Abdominal pain secondary to #1. 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 3. Hypertension. 4. History of gout. 5. Nausea and vomiting following FOLFIRINOX chemotherapy.  Aloxi and Emend added with cycle 2.  Decadron added with cycle 3. 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 7. Hospitalization 08/24/2013 through 08/27/2013 with poorly controlled nausea/vomiting. Diarrhea during the hospitalization. 8. Diarrhea. Question related to chemotherapy, question pancreatic insufficiency.   Disposition: He appears stable. He completed 5 cycles of FOLFIRINOX. Plan to proceed with cycle 6 today as scheduled.   He has mild neutropenia and thrombocytopenia on labs today. He will receive a Neulasta injection on the day of pump discontinuation. He knows to contact the office with fever, other signs of infection, bleeding.   The diarrhea may be related to chemotherapy or potentially pancreatic insufficiency. He will contact the office with any increase. He will continue Imodium as needed.   The hypokalemia is likely related to diarrhea. He will increase potassium to 20  meq twice daily for 3 days and then resume 20 mEq daily.  Restaging CT evaluation is planned 10/22/2013. He will return for a followup  visit in 2 weeks. He will contact the office in the interim with any problems.  Plan reviewed with [name].  Ned Card ANP/GNP-BC   10/18/2013  12:07 PM

## 2013-10-19 ENCOUNTER — Telehealth: Payer: Self-pay | Admitting: *Deleted

## 2013-10-19 ENCOUNTER — Encounter: Payer: Self-pay | Admitting: Family Medicine

## 2013-10-19 ENCOUNTER — Ambulatory Visit (INDEPENDENT_AMBULATORY_CARE_PROVIDER_SITE_OTHER): Admitting: Family Medicine

## 2013-10-19 ENCOUNTER — Other Ambulatory Visit: Payer: Self-pay | Admitting: *Deleted

## 2013-10-19 VITALS — BP 158/91 | HR 83 | Temp 98.4°F | Wt 168.7 lb

## 2013-10-19 DIAGNOSIS — C259 Malignant neoplasm of pancreas, unspecified: Secondary | ICD-10-CM

## 2013-10-19 DIAGNOSIS — I10 Essential (primary) hypertension: Secondary | ICD-10-CM

## 2013-10-19 MED ORDER — AMLODIPINE BESYLATE 5 MG PO TABS: 5.0000 mg | ORAL_TABLET | Freq: Every day | ORAL | Status: DC

## 2013-10-19 MED ORDER — DEXAMETHASONE 4 MG PO TABS: ORAL_TABLET | ORAL | Status: DC

## 2013-10-19 NOTE — Telephone Encounter (Signed)
Notified pt that Dexamethasone re-fill has been sent to his pharmacy per request.  Pt verbalized understanding and expressed appreciation for call

## 2013-10-19 NOTE — Patient Instructions (Addendum)
It was great to see you again today!  For blood pressure: Take amlodipine 5mg  daily. I sent in a new prescription. The goal blood pressure for you is less than 140/90. Call me if you are consistently getting above 140/90, as we might need to go back up on the amlodipine.  Follow up with me in 2 months for your physical, or sooner if you have any problems.  Be well, Dr. [name]

## 2013-10-20 ENCOUNTER — Ambulatory Visit

## 2013-10-20 ENCOUNTER — Ambulatory Visit (HOSPITAL_BASED_OUTPATIENT_CLINIC_OR_DEPARTMENT_OTHER)

## 2013-10-20 VITALS — BP 164/85 | HR 74 | Temp 98.7°F

## 2013-10-20 DIAGNOSIS — C259 Malignant neoplasm of pancreas, unspecified: Secondary | ICD-10-CM

## 2013-10-20 DIAGNOSIS — Z5189 Encounter for other specified aftercare: Secondary | ICD-10-CM

## 2013-10-20 DIAGNOSIS — C257 Malignant neoplasm of other parts of pancreas: Secondary | ICD-10-CM

## 2013-10-20 MED ORDER — HEPARIN SOD (PORK) LOCK FLUSH 100 UNIT/ML IV SOLN
500.0000 [IU] | Freq: Once | INTRAVENOUS | Status: AC | PRN
  Administered 2013-10-20: 500 [IU]
  Filled 2013-10-20: qty 5

## 2013-10-20 MED ORDER — SODIUM CHLORIDE 0.9 % IJ SOLN
10.0000 mL | INTRAMUSCULAR | Status: DC | PRN
  Administered 2013-10-20: 10 mL
  Filled 2013-10-20: qty 10

## 2013-10-20 MED ORDER — PEGFILGRASTIM INJECTION 6 MG/0.6ML
6.0000 mg | Freq: Once | SUBCUTANEOUS | Status: AC
  Administered 2013-10-20: 6 mg via SUBCUTANEOUS
  Filled 2013-10-20: qty 0.6

## 2013-10-20 NOTE — Assessment & Plan Note (Signed)
BP has creeped back up while off antihypertensives. Will restart lower dose of amlodipine, 5mg  daily. Advised pt that BP goal is 140/90. He gets his BP checked frequently with oncology visits, so I have asked him to come see me sooner if his BP is consistently elevated above this goal, otherwise he will see me in 2 months for a complete physical.

## 2013-10-20 NOTE — Progress Notes (Signed)
Patient ID: Ray [name], male   DOB: [birth year], 58 y.o.   MRN: [id number]  HPI:  HTN: stopped amlodipine after last visit. BP has been borderline elevated consistently since that time. Did not have any symptomatic changes or feel differently after stopping amlodipine.  Pancreatic cancer: currently undergoing chemo infusion. Pain is being managed by oncology. They are working to adjust his pain medications and are considering increasing the dose of his MS Contin so he has to take less short-acting medicine as he was running low in quantity. He previously had a lot of trouble with diarrhea but is now on loperamide and it's working well.  ROS: See HPI.  [place]: hx HTN, splenic vein thrombosis secondary to pancreatic cancer, pancreatic cancer, chronic narcotic use  PHYSICAL EXAM: BP 158/91  Pulse 83  Temp(Src) 98.4 F (36.9 C) (Oral)  Wt 168 lb 11.2 oz (76.522 kg) Gen: NAD, pleasant, cooperative HEENT: NCAT Heart: RRR no murmurs Lungs: CTAB, NWOB Neuro: grossly nonfocal speech normal Ext: No appreciable lower extremity edema bilaterally   ASSESSMENT/PLAN:  Health maintenance:  -instructed pt to schedule physical in about 2 months to review health maintenance items  Hypertension BP has creeped back up while off antihypertensives. Will restart lower dose of amlodipine, 5mg  daily. Advised pt that BP goal is 140/90. He gets his BP checked frequently with oncology visits, so I have asked him to come see me sooner if his BP is consistently elevated above this goal, otherwise he will see me in 2 months for a complete physical.  Pancreatic cancer Continues to undergo treatments by oncology and has appt upcoming with surgical oncology. His pain is presently managed by his medical oncologist. I advised him that if for some reason he runs out early of his pain medicine he can call our office and I will work with him on getting additional pain medicine. He is well known to me and shows no signs  of inappropriate use of chronic narcotics, so I would be comfortable helping manage his pain if necessary. For now, he'll continue to have this prescribed by his oncologist.   FOLLOW UP: F/u in 2 months for HTN & physical.  Delorse Limber. [name], [place]

## 2013-10-20 NOTE — Assessment & Plan Note (Signed)
Continues to undergo treatments by oncology and has appt upcoming with surgical oncology. His pain is presently managed by his medical oncologist. I advised him that if for some reason he runs out early of his pain medicine he can call our office and I will work with him on getting additional pain medicine. He is well known to me and shows no signs of inappropriate use of chronic narcotics, so I would be comfortable helping manage his pain if necessary. For now, he'll continue to have this prescribed by his oncologist.

## 2013-10-20 NOTE — Patient Instructions (Signed)
Pegfilgrastim injection What is this medicine? PEGFILGRASTIM (peg fil GRA stim) is a long-acting granulocyte colony-stimulating factor that stimulates the growth of neutrophils, a type of white blood cell important in the body's fight against infection. It is used to reduce the incidence of fever and infection in patients with certain types of cancer who are receiving chemotherapy that affects the bone marrow. This medicine may be used for other purposes; ask your health care provider or pharmacist if you have questions. COMMON BRAND NAME(S): Neulasta What should I tell my health care provider before I take this medicine? They need to know if you have any of these conditions: -latex allergy -ongoing radiation therapy -sickle cell disease -skin reactions to acrylic adhesives (On-Body Injector only) -an unusual or allergic reaction to pegfilgrastim, filgrastim, other medicines, foods, dyes, or preservatives -pregnant or trying to get pregnant -breast-feeding How should I use this medicine? This medicine is for injection under the skin. If you get this medicine at home, you will be taught how to prepare and give the pre-filled syringe or how to use the On-body Injector. Refer to the patient Instructions for Use for detailed instructions. Use exactly as directed. Take your medicine at regular intervals. Do not take your medicine more often than directed. It is important that you put your used needles and syringes in a special sharps container. Do not put them in a trash can. If you do not have a sharps container, call your pharmacist or healthcare provider to get one. Talk to your pediatrician regarding the use of this medicine in children. Special care may be needed. Overdosage: If you think you have taken too much of this medicine contact a poison control center or emergency room at once. NOTE: This medicine is only for you. Do not share this medicine with others. What if I miss a dose? It is  important not to miss your dose. Call your doctor or health care professional if you miss your dose. If you miss a dose due to an On-body Injector failure or leakage, a new dose should be administered as soon as possible using a single prefilled syringe for manual use. What may interact with this medicine? Interactions have not been studied. Give your health care provider a list of all the medicines, herbs, non-prescription drugs, or dietary supplements you use. Also tell them if you smoke, drink alcohol, or use illegal drugs. Some items may interact with your medicine. This list may not describe all possible interactions. Give your health care provider a list of all the medicines, herbs, non-prescription drugs, or dietary supplements you use. Also tell them if you smoke, drink alcohol, or use illegal drugs. Some items may interact with your medicine. What should I watch for while using this medicine? You may need blood work done while you are taking this medicine. If you are going to need a MRI, CT scan, or other procedure, tell your doctor that you are using this medicine (On-Body Injector only). What side effects may I notice from receiving this medicine? Side effects that you should report to your doctor or health care professional as soon as possible: -allergic reactions like skin rash, itching or hives, swelling of the face, lips, or tongue -dizziness -fever -pain, redness, or irritation at site where injected -pinpoint red spots on the skin -shortness of breath or breathing problems -stomach or side pain, or pain at the shoulder -swelling -tiredness -trouble passing urine Side effects that usually do not require medical attention (report to your doctor   or health care professional if they continue or are bothersome): -bone pain -muscle pain This list may not describe all possible side effects. Call your doctor for medical advice about side effects. You may report side effects to FDA at  1-800-FDA-1088. Where should I keep my medicine? Keep out of the reach of children. Store pre-filled syringes in a refrigerator between 2 and 8 degrees C (36 and 46 degrees F). Do not freeze. Keep in carton to protect from light. Throw away this medicine if it is left out of the refrigerator for more than 48 hours. Throw away any unused medicine after the expiration date. NOTE: This sheet is a summary. It may not cover all possible information. If you have questions about this medicine, talk to your doctor, pharmacist, or health care provider.  2015, [employer]/Gold Standard. (2013-04-22 16:14:05)  

## 2013-10-21 ENCOUNTER — Other Ambulatory Visit: Payer: Self-pay | Admitting: Family Medicine

## 2013-10-22 ENCOUNTER — Ambulatory Visit (HOSPITAL_COMMUNITY)
Admission: RE | Admit: 2013-10-22 | Discharge: 2013-10-22 | Disposition: A | Discharge status: Home / Self Care | Source: Ambulatory Visit | Attending: Nurse Practitioner | Admitting: Nurse Practitioner

## 2013-10-22 DIAGNOSIS — C259 Malignant neoplasm of pancreas, unspecified: Secondary | ICD-10-CM

## 2013-10-22 DIAGNOSIS — D7389 Other diseases of spleen: Secondary | ICD-10-CM | POA: Diagnosis not present

## 2013-10-22 DIAGNOSIS — K7689 Other specified diseases of liver: Secondary | ICD-10-CM | POA: Insufficient documentation

## 2013-10-22 MED ORDER — IOHEXOL 300 MG/ML  SOLN
100.0000 mL | Freq: Once | INTRAMUSCULAR | Status: AC | PRN
  Administered 2013-10-22: 100 mL via INTRAVENOUS

## 2013-10-25 ENCOUNTER — Other Ambulatory Visit: Payer: Self-pay | Admitting: *Deleted

## 2013-10-25 DIAGNOSIS — C259 Malignant neoplasm of pancreas, unspecified: Secondary | ICD-10-CM

## 2013-10-25 MED ORDER — PROCHLORPERAZINE MALEATE 10 MG PO TABS: 10.0000 mg | ORAL_TABLET | Freq: Four times a day (QID) | ORAL | Status: DC | PRN

## 2013-10-31 ENCOUNTER — Other Ambulatory Visit: Payer: Self-pay | Admitting: Oncology

## 2013-11-01 ENCOUNTER — Other Ambulatory Visit: Payer: Self-pay | Admitting: *Deleted

## 2013-11-01 ENCOUNTER — Telehealth: Payer: Self-pay | Admitting: Oncology

## 2013-11-01 ENCOUNTER — Encounter: Payer: Self-pay | Admitting: Oncology

## 2013-11-01 ENCOUNTER — Ambulatory Visit (HOSPITAL_BASED_OUTPATIENT_CLINIC_OR_DEPARTMENT_OTHER): Admitting: Oncology

## 2013-11-01 ENCOUNTER — Ambulatory Visit

## 2013-11-01 ENCOUNTER — Other Ambulatory Visit (HOSPITAL_BASED_OUTPATIENT_CLINIC_OR_DEPARTMENT_OTHER)

## 2013-11-01 VITALS — BP 141/90 | HR 90 | Temp 99.5°F | Resp 18 | Ht 70.0 in | Wt 159.7 lb

## 2013-11-01 DIAGNOSIS — C257 Malignant neoplasm of other parts of pancreas: Secondary | ICD-10-CM

## 2013-11-01 DIAGNOSIS — C259 Malignant neoplasm of pancreas, unspecified: Secondary | ICD-10-CM

## 2013-11-01 DIAGNOSIS — R109 Unspecified abdominal pain: Secondary | ICD-10-CM

## 2013-11-01 DIAGNOSIS — D7389 Other diseases of spleen: Secondary | ICD-10-CM

## 2013-11-01 DIAGNOSIS — R197 Diarrhea, unspecified: Secondary | ICD-10-CM

## 2013-11-01 LAB — CBC WITH DIFFERENTIAL/PLATELET
BASO%: 0.3 % (ref 0.0–2.0)
Basophils Absolute: 0.1 10*3/uL (ref 0.0–0.1)
EOS%: 0.1 % (ref 0.0–7.0)
Eosinophils Absolute: 0 10*3/uL (ref 0.0–0.5)
HEMATOCRIT: 32.9 % — AB (ref 38.4–49.9)
HGB: 10.3 g/dL — ABNORMAL LOW (ref 13.0–17.1)
LYMPH%: 12.7 % — ABNORMAL LOW (ref 14.0–49.0)
MCH: 27.4 pg (ref 27.2–33.4)
MCHC: 31.3 g/dL — AB (ref 32.0–36.0)
MCV: 87.4 fL (ref 79.3–98.0)
MONO#: 1.6 10*3/uL — ABNORMAL HIGH (ref 0.1–0.9)
MONO%: 6.7 % (ref 0.0–14.0)
NEUT#: 18.9 10*3/uL — ABNORMAL HIGH (ref 1.5–6.5)
NEUT%: 80.2 % — AB (ref 39.0–75.0)
Platelets: 130 10*3/uL — ABNORMAL LOW (ref 140–400)
RBC: 3.76 10*6/uL — AB (ref 4.20–5.82)
RDW: 20.6 % — ABNORMAL HIGH (ref 11.0–14.6)
WBC: 23.6 10*3/uL — AB (ref 4.0–10.3)
lymph#: 3 10*3/uL (ref 0.9–3.3)

## 2013-11-01 LAB — COMPREHENSIVE METABOLIC PANEL (CC13)
ALT: 11 U/L (ref 0–55)
AST: 15 U/L (ref 5–34)
Albumin: 3.2 g/dL — ABNORMAL LOW (ref 3.5–5.0)
Alkaline Phosphatase: 180 U/L — ABNORMAL HIGH (ref 40–150)
Anion Gap: 10 mEq/L (ref 3–11)
BUN: 11.5 mg/dL (ref 7.0–26.0)
CALCIUM: 8.9 mg/dL (ref 8.4–10.4)
CHLORIDE: 106 meq/L (ref 98–109)
CO2: 25 mEq/L (ref 22–29)
CREATININE: 0.8 mg/dL (ref 0.7–1.3)
Glucose: 103 mg/dl (ref 70–140)
Potassium: 4.1 mEq/L (ref 3.5–5.1)
Sodium: 141 mEq/L (ref 136–145)
TOTAL PROTEIN: 6 g/dL — AB (ref 6.4–8.3)
Total Bilirubin: 0.38 mg/dL (ref 0.20–1.20)

## 2013-11-01 LAB — CANCER ANTIGEN 19-9: CA 19-9: 4609.6 U/mL — ABNORMAL HIGH (ref <35.0)

## 2013-11-01 MED ORDER — POTASSIUM CHLORIDE CRYS ER 20 MEQ PO TBCR: 20.0000 meq | EXTENDED_RELEASE_TABLET | Freq: Every day | ORAL | Status: DC

## 2013-11-01 MED ORDER — PROCHLORPERAZINE MALEATE 10 MG PO TABS: 10.0000 mg | ORAL_TABLET | Freq: Four times a day (QID) | ORAL | Status: DC | PRN

## 2013-11-01 NOTE — Telephone Encounter (Signed)
gv pt appt schedule for oct.  °

## 2013-11-01 NOTE — Telephone Encounter (Signed)
Pt requests to send his potassium and Compazine Rx to [facility].

## 2013-11-01 NOTE — Progress Notes (Signed)
[name] OFFICE PROGRESS NOTE   Diagnosis: Pancreas cancer  INTERVAL HISTORY:   [name] returns as scheduled. He completed another cycle of FOLFIRINOX 10/18/2013. He reports tolerating the chemotherapy well. No nausea/vomiting, mouth sores, or diarrhea following chemotherapy. The abdominal pain is much improved as compared to prechemotherapy. Good appetite. He is active. Beginning yesterday he noted a "sore throat" and had an episode of nausea/vomiting last night and again this morning. No fever. He has an intermittent productive cough.  Objective:  Vital signs in last 24 hours:  Blood pressure 141/90, pulse 90, temperature 99.5 F (37.5 C), temperature source Oral, resp. rate 18, height 5\' 10"  (1.778 m), weight 159 lb 11.2 oz (72.439 kg), SpO2 99.00%.    HEENT: No thrush or ulcers Resp: Lungs clear bilaterally Cardio: Regular rate and rhythm GI: Soft and nontender, no hepatomegaly, no mass Vascular: The right lower leg is slightly larger than the left side, no edema or tenderness Neuro: Mild decrease in vibratory sense at the fingertip Skin: Hyperpigmentation and skin thickening of the palms   Portacath/PICC-without erythema  Lab Results:  Lab Results  Component Value Date   WBC 23.6* 11/01/2013   HGB 10.3* 11/01/2013   HCT 32.9* 11/01/2013   MCV 87.4 11/01/2013   PLT 130* 11/01/2013   NEUTROABS 18.9* 11/01/2013   CA [id number] on 10/04/2013   Medications: I have reviewed the patient's current medications.  Assessment/Plan: 1. Pancreas cancer. 07/06/2013 ,CA 19-9- 2067.  CT abdomen/pelvis 07/09/2013 with interval development of amorphous soft tissue circumferentially encasing the celiac axis and tracking caudally down toward the SMA. Associated stable irregular mild distention of the main pancreatic duct in the body and tail of the pancreas with no discrete mass lesion seen. 2.2 x 1.4 cm portacaval lymph node in the hepatoduodenal ligament had increased in  size from 1.3 x 1.1 cm previously. Other scattered small lymph nodes seen in the hepatoduodenal ligament.  Endoscopic ultrasound 07/15/2013 with a very vague masslike lesion in the region of the uncinate pancreas. Fine needle aspiration showed atypical cells consistent with adenocarcinoma.  PET scan 07/29/2013 with hypermetabolic soft tissue posterior to the pancreas with an adjacent hypermetabolic lymph node, no hypermetabolic pancreas mass, no evidence of distant metastatic disease  Cycle 1 FOLFIRINOX 08/02/2013.  Cycle 2 FOLFIRINOX 08/16/2013.  Cycle 3 FOLFIRINOX 09/06/2013.  Cycle 4 FOLFIRINOX 09/21/2013.  CA 19-9 improved on 10/04/2013.  Cycle 5 FOLFIRINOX 10/04/2013. Cycle 6 FOLFIRINOX 10/18/2013 Restaging CT 10/22/2013 with no change in the soft tissue surrounding the pancreas/celiac axis, scattered indeterminate low-attenuation liver lesions are more conspicuous 2. Abdominal pain secondary to #1. 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 3. Hypertension. 4. History of gout. 5. Nausea and vomiting following FOLFIRINOX chemotherapy.  Aloxi and Emend added with cycle 2.  Decadron added with cycle 3. 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 7. Hospitalization 08/24/2013 through 08/27/2013 with poorly controlled nausea/vomiting. Diarrhea during the hospitalization. 8. Diarrhea. Question related to chemotherapy, question pancreatic insufficiency.  Disposition:  Ray [name] has completed 6 cycles of FOLFIRINOX. His pain and performance status are improved. The CA 19-9 is lower. I reviewed the CT images with him. There is no clear evidence of disease progression. I recommend continuing FOLFIRINOX with close followup of the CA 19-9 with a repeat CT in approximately 2 months.  He does not feel well today. He most likely has a viral infection. We decided to hold the next cycle of chemotherapy for one week. He will contact us if his symptoms do  not improve.  Betsy Coder,  MD  11/01/2013  9:11 AM

## 2013-11-02 ENCOUNTER — Telehealth: Payer: Self-pay | Admitting: *Deleted

## 2013-11-02 NOTE — Telephone Encounter (Signed)
Called pt with CA 19-9 results: Better, per [name]. Pt voiced understanding.

## 2013-11-02 NOTE — Telephone Encounter (Signed)
Message copied by Brien Few on Tue Nov 02, 2013  3:01 PM ------      Message from: Betsy Coder B      Created: Mon Nov 01, 2013  5:49 PM       Please call patient, ca19-9 is better, f/u as scheduled ------

## 2013-11-03 ENCOUNTER — Encounter

## 2013-11-03 ENCOUNTER — Ambulatory Visit

## 2013-11-05 ENCOUNTER — Encounter: Payer: Self-pay | Admitting: Pharmacist

## 2013-11-05 DIAGNOSIS — Z8719 Personal history of other diseases of the digestive system: Secondary | ICD-10-CM | POA: Insufficient documentation

## 2013-11-07 ENCOUNTER — Other Ambulatory Visit: Payer: Self-pay | Admitting: Oncology

## 2013-11-08 ENCOUNTER — Telehealth: Payer: Self-pay

## 2013-11-08 ENCOUNTER — Other Ambulatory Visit (HOSPITAL_BASED_OUTPATIENT_CLINIC_OR_DEPARTMENT_OTHER)

## 2013-11-08 ENCOUNTER — Ambulatory Visit (HOSPITAL_BASED_OUTPATIENT_CLINIC_OR_DEPARTMENT_OTHER)

## 2013-11-08 ENCOUNTER — Ambulatory Visit (HOSPITAL_BASED_OUTPATIENT_CLINIC_OR_DEPARTMENT_OTHER): Admitting: Nurse Practitioner

## 2013-11-08 ENCOUNTER — Telehealth: Payer: Self-pay | Admitting: Oncology

## 2013-11-08 VITALS — BP 137/88 | HR 91 | Temp 98.4°F | Resp 18 | Ht 70.0 in | Wt 159.4 lb

## 2013-11-08 DIAGNOSIS — C258 Malignant neoplasm of overlapping sites of pancreas: Secondary | ICD-10-CM

## 2013-11-08 DIAGNOSIS — R112 Nausea with vomiting, unspecified: Secondary | ICD-10-CM

## 2013-11-08 DIAGNOSIS — Z5111 Encounter for antineoplastic chemotherapy: Secondary | ICD-10-CM

## 2013-11-08 DIAGNOSIS — Z23 Encounter for immunization: Secondary | ICD-10-CM

## 2013-11-08 DIAGNOSIS — C259 Malignant neoplasm of pancreas, unspecified: Secondary | ICD-10-CM

## 2013-11-08 DIAGNOSIS — I10 Essential (primary) hypertension: Secondary | ICD-10-CM

## 2013-11-08 DIAGNOSIS — Z8719 Personal history of other diseases of the digestive system: Secondary | ICD-10-CM

## 2013-11-08 LAB — CBC WITH DIFFERENTIAL/PLATELET
BASO%: 0.7 % (ref 0.0–2.0)
BASOS ABS: 0.1 10*3/uL (ref 0.0–0.1)
EOS%: 0 % (ref 0.0–7.0)
Eosinophils Absolute: 0 10*3/uL (ref 0.0–0.5)
HCT: 32.9 % — ABNORMAL LOW (ref 38.4–49.9)
HEMOGLOBIN: 10.2 g/dL — AB (ref 13.0–17.1)
LYMPH%: 14 % (ref 14.0–49.0)
MCH: 27.7 pg (ref 27.2–33.4)
MCHC: 31.1 g/dL — ABNORMAL LOW (ref 32.0–36.0)
MCV: 89 fL (ref 79.3–98.0)
MONO#: 0.9 10*3/uL (ref 0.1–0.9)
MONO%: 6.1 % (ref 0.0–14.0)
NEUT#: 12.2 10*3/uL — ABNORMAL HIGH (ref 1.5–6.5)
NEUT%: 79.2 % — ABNORMAL HIGH (ref 39.0–75.0)
PLATELETS: 143 10*3/uL (ref 140–400)
RBC: 3.7 10*6/uL — ABNORMAL LOW (ref 4.20–5.82)
RDW: 20.6 % — ABNORMAL HIGH (ref 11.0–14.6)
WBC: 15.4 10*3/uL — ABNORMAL HIGH (ref 4.0–10.3)
lymph#: 2.2 10*3/uL (ref 0.9–3.3)

## 2013-11-08 LAB — COMPREHENSIVE METABOLIC PANEL (CC13)
ALK PHOS: 142 U/L (ref 40–150)
ALT: 12 U/L (ref 0–55)
AST: 13 U/L (ref 5–34)
Albumin: 3.2 g/dL — ABNORMAL LOW (ref 3.5–5.0)
Anion Gap: 9 mEq/L (ref 3–11)
BILIRUBIN TOTAL: 0.3 mg/dL (ref 0.20–1.20)
BUN: 10 mg/dL (ref 7.0–26.0)
CO2: 28 mEq/L (ref 22–29)
CREATININE: 0.7 mg/dL (ref 0.7–1.3)
Calcium: 9.1 mg/dL (ref 8.4–10.4)
Chloride: 105 mEq/L (ref 98–109)
GLUCOSE: 75 mg/dL (ref 70–140)
Potassium: 4.2 mEq/L (ref 3.5–5.1)
Sodium: 142 mEq/L (ref 136–145)
Total Protein: 6.2 g/dL — ABNORMAL LOW (ref 6.4–8.3)

## 2013-11-08 MED ORDER — ATROPINE SULFATE 1 MG/ML IJ SOLN
INTRAMUSCULAR | Status: AC
  Filled 2013-11-08: qty 1

## 2013-11-08 MED ORDER — SODIUM CHLORIDE 0.9 % IV SOLN
2400.0000 mg/m2 | INTRAVENOUS | Status: DC
  Administered 2013-11-08: 4750 mg via INTRAVENOUS
  Filled 2013-11-08: qty 95

## 2013-11-08 MED ORDER — PALONOSETRON HCL INJECTION 0.25 MG/5ML
INTRAVENOUS | Status: AC
  Filled 2013-11-08: qty 5

## 2013-11-08 MED ORDER — OXALIPLATIN CHEMO INJECTION 100 MG/20ML
85.0000 mg/m2 | Freq: Once | INTRAVENOUS | Status: AC
  Administered 2013-11-08: 165 mg via INTRAVENOUS
  Filled 2013-11-08: qty 33

## 2013-11-08 MED ORDER — DEXTROSE 5 % IV SOLN
140.0000 mg/m2 | Freq: Once | INTRAVENOUS | Status: AC
  Administered 2013-11-08: 276 mg via INTRAVENOUS
  Filled 2013-11-08: qty 13.8

## 2013-11-08 MED ORDER — OXYCODONE-ACETAMINOPHEN 10-325 MG PO TABS: ORAL_TABLET | ORAL | Status: DC

## 2013-11-08 MED ORDER — PALONOSETRON HCL INJECTION 0.25 MG/5ML
0.2500 mg | Freq: Once | INTRAVENOUS | Status: AC
  Administered 2013-11-08: 0.25 mg via INTRAVENOUS

## 2013-11-08 MED ORDER — ATROPINE SULFATE 1 MG/ML IJ SOLN
0.5000 mg | Freq: Once | INTRAMUSCULAR | Status: AC | PRN
  Administered 2013-11-08: 0.5 mg via INTRAVENOUS

## 2013-11-08 MED ORDER — INFLUENZA VAC SPLIT QUAD 0.5 ML IM SUSY
0.5000 mL | PREFILLED_SYRINGE | Freq: Once | INTRAMUSCULAR | Status: AC
  Administered 2013-11-08: 0.5 mL via INTRAMUSCULAR
  Filled 2013-11-08: qty 0.5

## 2013-11-08 MED ORDER — LEUCOVORIN CALCIUM INJECTION 350 MG
400.0000 mg/m2 | Freq: Once | INTRAVENOUS | Status: AC
  Administered 2013-11-08: 800 mg via INTRAVENOUS
  Filled 2013-11-08: qty 40

## 2013-11-08 MED ORDER — DEXAMETHASONE SODIUM PHOSPHATE 20 MG/5ML IJ SOLN
INTRAMUSCULAR | Status: AC
  Filled 2013-11-08: qty 5

## 2013-11-08 MED ORDER — DEXTROSE 5 % IV SOLN
Freq: Once | INTRAVENOUS | Status: AC
  Administered 2013-11-08: 12:00:00 via INTRAVENOUS

## 2013-11-08 MED ORDER — DEXAMETHASONE SODIUM PHOSPHATE 20 MG/5ML IJ SOLN
12.0000 mg | Freq: Once | INTRAMUSCULAR | Status: AC
  Administered 2013-11-08: 12 mg via INTRAVENOUS

## 2013-11-08 MED ORDER — SODIUM CHLORIDE 0.9 % IV SOLN
150.0000 mg | Freq: Once | INTRAVENOUS | Status: AC
  Administered 2013-11-08: 150 mg via INTRAVENOUS
  Filled 2013-11-08: qty 5

## 2013-11-08 NOTE — Telephone Encounter (Signed)
gv and printed appt sched and avs for pt for NOV...sed added tx. °

## 2013-11-08 NOTE — Telephone Encounter (Signed)
Message copied by Bevelyn Ngo on Mon Nov 08, 2013  4:12 PM ------      Message from: Arnold, Fayette K      Created: Mon Nov 08, 2013  3:51 PM       Please have him decrease potassium to once daily. ------

## 2013-11-08 NOTE — Patient Instructions (Signed)
[facility] Discharge Instructions for Patients Receiving Chemotherapy  Today you received the following chemotherapy agents: Oxaliplatin, Irinotecan, Leucovorin, and Adrucil.  To help prevent nausea and vomiting after your treatment, we encourage you to take your nausea medication as prescribed.   If you develop nausea and vomiting that is not controlled by your nausea medication, call the clinic.   BELOW ARE SYMPTOMS THAT SHOULD BE REPORTED IMMEDIATELY:  *FEVER GREATER THAN 100.5 F  *CHILLS WITH OR WITHOUT FEVER  NAUSEA AND VOMITING THAT IS NOT CONTROLLED WITH YOUR NAUSEA MEDICATION  *UNUSUAL SHORTNESS OF BREATH  *UNUSUAL BRUISING OR BLEEDING  TENDERNESS IN MOUTH AND THROAT WITH OR WITHOUT PRESENCE OF ULCERS  *URINARY PROBLEMS  *BOWEL PROBLEMS  UNUSUAL RASH Items with * indicate a potential emergency and should be followed up as soon as possible.  Feel free to call the clinic you have any questions or concerns. The clinic phone number is [phone].    

## 2013-11-08 NOTE — Telephone Encounter (Signed)
Informed pt in chemo to decrease potassium to once daily. Pt verbalized understanding.

## 2013-11-08 NOTE — Progress Notes (Signed)
Okay to proceed with treatment today without CMET results per Lattie Haw, NP.

## 2013-11-08 NOTE — Progress Notes (Signed)
  [place] OFFICE PROGRESS NOTE   Diagnosis:  Pancreas cancer  INTERVAL HISTORY:   Ray [name] returns as scheduled. He completed cycle 6 FOLFIRINOX on 10/18/2013. Treatment was held on 11/01/2013 do to a probable viral infection.  He is feeling better. He has occasional mild nausea relieved with Compazine. No mouth sores. No diarrhea or constipation. He has mild tingling in the fingertips. No numbness. Abdominal pain is well-controlled with Percocet 4-5 times a day. He has a good appetite and reports his weight is stable.  Objective:  Vital signs in last 24 hours:  Blood pressure 137/88, pulse 91, temperature 98.4 F (36.9 C), temperature source Oral, resp. rate 18, height 5\' 10"  (1.778 m), weight 159 lb 6.4 oz (72.303 kg).    HEENT: No thrush or ulcers. Resp: Lungs clear bilaterally. Cardio: Regular rate and rhythm. GI: Abdomen soft and nontender. No mass. No hepatomegaly. Vascular: No leg edema. Calves soft and nontender. Neuro: Vibratory sense mildly decreased over the fingertips on the left hand and intact over the fingertips on the right hand.  Port-A-Cath without erythema.    Lab Results:  Lab Results  Component Value Date   WBC 15.4* 11/08/2013   HGB 10.2* 11/08/2013   HCT 32.9* 11/08/2013   MCV 89.0 11/08/2013   PLT 143 11/08/2013   NEUTROABS 12.2* 11/08/2013    Imaging:  No results found.  Medications: I have reviewed the patient's current medications.  Assessment/Plan: 1. Pancreas cancer. 07/06/2013 ,CA 19-9- 2067.  CT abdomen/pelvis 07/09/2013 with interval development of amorphous soft tissue circumferentially encasing the celiac axis and tracking caudally down toward the SMA. Associated stable irregular mild distention of the main pancreatic duct in the body and tail of the pancreas with no discrete mass lesion seen. 2.2 x 1.4 cm portacaval lymph node in the hepatoduodenal ligament had increased in size from 1.3 x 1.1 cm previously. Other  scattered small lymph nodes seen in the hepatoduodenal ligament.  Endoscopic ultrasound 07/15/2013 with a very vague masslike lesion in the region of the uncinate pancreas. Fine needle aspiration showed atypical cells consistent with adenocarcinoma.  PET scan 07/29/2013 with hypermetabolic soft tissue posterior to the pancreas with an adjacent hypermetabolic lymph node, no hypermetabolic pancreas mass, no evidence of distant metastatic disease  Cycle 1 FOLFIRINOX 08/02/2013.  Cycle 2 FOLFIRINOX 08/16/2013.  Cycle 3 FOLFIRINOX 09/06/2013.  Cycle 4 FOLFIRINOX 09/21/2013.  CA 19-9 improved on 10/04/2013.  Cycle 5 FOLFIRINOX 10/04/2013.  Cycle 6 FOLFIRINOX 10/18/2013  Restaging CT 10/22/2013 with no change in the soft tissue surrounding the pancreas/celiac axis, scattered indeterminate low-attenuation liver lesions are more conspicuous. 2. Abdominal pain secondary to #1. 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 3. Hypertension. 4. History of gout. 5. Nausea and vomiting following FOLFIRINOX chemotherapy.  Aloxi and Emend added with cycle 2.  Decadron added with cycle 3. 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 7. Hospitalization 08/24/2013 through 08/27/2013 with poorly controlled nausea/vomiting. Diarrhea during the hospitalization. 8. Diarrhea. Question related to chemotherapy, question pancreatic insufficiency.   Disposition: Ray [name] appears stable. Plan to proceed with cycle 7 FOLFIRINOX today as scheduled. He will return for a followup visit and cycle 8 in 2 weeks. He will contact the office in the interim with any problems.  Plan reviewed with [name].    Ned Card ANP/GNP-BC   11/08/2013  9:29 AM

## 2013-11-10 ENCOUNTER — Ambulatory Visit (HOSPITAL_BASED_OUTPATIENT_CLINIC_OR_DEPARTMENT_OTHER)

## 2013-11-10 ENCOUNTER — Ambulatory Visit

## 2013-11-10 VITALS — BP 134/83 | HR 97 | Temp 98.3°F

## 2013-11-10 DIAGNOSIS — C259 Malignant neoplasm of pancreas, unspecified: Secondary | ICD-10-CM

## 2013-11-10 DIAGNOSIS — Z08 Encounter for follow-up examination after completed treatment for malignant neoplasm: Secondary | ICD-10-CM

## 2013-11-10 DIAGNOSIS — C258 Malignant neoplasm of overlapping sites of pancreas: Secondary | ICD-10-CM

## 2013-11-10 MED ORDER — PEGFILGRASTIM INJECTION 6 MG/0.6ML
6.0000 mg | Freq: Once | SUBCUTANEOUS | Status: AC
  Administered 2013-11-10: 6 mg via SUBCUTANEOUS
  Filled 2013-11-10: qty 0.6

## 2013-11-10 MED ORDER — SODIUM CHLORIDE 0.9 % IJ SOLN
10.0000 mL | INTRAMUSCULAR | Status: DC | PRN
  Administered 2013-11-10: 10 mL
  Filled 2013-11-10: qty 10

## 2013-11-10 MED ORDER — HEPARIN SOD (PORK) LOCK FLUSH 100 UNIT/ML IV SOLN
500.0000 [IU] | Freq: Once | INTRAVENOUS | Status: AC | PRN
  Administered 2013-11-10: 500 [IU]
  Filled 2013-11-10: qty 5

## 2013-11-21 ENCOUNTER — Other Ambulatory Visit: Payer: Self-pay | Admitting: Oncology

## 2013-11-22 ENCOUNTER — Encounter: Payer: Self-pay | Admitting: *Deleted

## 2013-11-22 ENCOUNTER — Other Ambulatory Visit (HOSPITAL_BASED_OUTPATIENT_CLINIC_OR_DEPARTMENT_OTHER)

## 2013-11-22 ENCOUNTER — Ambulatory Visit (HOSPITAL_BASED_OUTPATIENT_CLINIC_OR_DEPARTMENT_OTHER): Admitting: Oncology

## 2013-11-22 ENCOUNTER — Telehealth: Payer: Self-pay | Admitting: Oncology

## 2013-11-22 ENCOUNTER — Ambulatory Visit (HOSPITAL_BASED_OUTPATIENT_CLINIC_OR_DEPARTMENT_OTHER)

## 2013-11-22 VITALS — BP 148/95 | HR 86 | Temp 98.9°F | Resp 18 | Ht 70.0 in | Wt 167.4 lb

## 2013-11-22 DIAGNOSIS — C258 Malignant neoplasm of overlapping sites of pancreas: Secondary | ICD-10-CM

## 2013-11-22 DIAGNOSIS — Z8719 Personal history of other diseases of the digestive system: Secondary | ICD-10-CM

## 2013-11-22 DIAGNOSIS — Z5111 Encounter for antineoplastic chemotherapy: Secondary | ICD-10-CM

## 2013-11-22 DIAGNOSIS — G893 Neoplasm related pain (acute) (chronic): Secondary | ICD-10-CM

## 2013-11-22 DIAGNOSIS — C259 Malignant neoplasm of pancreas, unspecified: Secondary | ICD-10-CM

## 2013-11-22 LAB — COMPREHENSIVE METABOLIC PANEL (CC13)
ALBUMIN: 3.2 g/dL — AB (ref 3.5–5.0)
ALT: 18 U/L (ref 0–55)
ANION GAP: 10 meq/L (ref 3–11)
AST: 12 U/L (ref 5–34)
Alkaline Phosphatase: 163 U/L — ABNORMAL HIGH (ref 40–150)
BILIRUBIN TOTAL: 0.26 mg/dL (ref 0.20–1.20)
BUN: 9.3 mg/dL (ref 7.0–26.0)
CO2: 25 mEq/L (ref 22–29)
Calcium: 9.2 mg/dL (ref 8.4–10.4)
Chloride: 109 mEq/L (ref 98–109)
Creatinine: 0.8 mg/dL (ref 0.7–1.3)
Glucose: 110 mg/dl (ref 70–140)
Potassium: 4.2 mEq/L (ref 3.5–5.1)
SODIUM: 144 meq/L (ref 136–145)
TOTAL PROTEIN: 6 g/dL — AB (ref 6.4–8.3)

## 2013-11-22 LAB — CBC WITH DIFFERENTIAL/PLATELET
BASO%: 0.1 % (ref 0.0–2.0)
Basophils Absolute: 0 10*3/uL (ref 0.0–0.1)
EOS ABS: 0 10*3/uL (ref 0.0–0.5)
EOS%: 0.2 % (ref 0.0–7.0)
HEMATOCRIT: 33.7 % — AB (ref 38.4–49.9)
HEMOGLOBIN: 10.5 g/dL — AB (ref 13.0–17.1)
LYMPH%: 15.8 % (ref 14.0–49.0)
MCH: 27.3 pg (ref 27.2–33.4)
MCHC: 31.2 g/dL — ABNORMAL LOW (ref 32.0–36.0)
MCV: 87.8 fL (ref 79.3–98.0)
MONO#: 1.1 10*3/uL — ABNORMAL HIGH (ref 0.1–0.9)
MONO%: 6.6 % (ref 0.0–14.0)
NEUT#: 12.6 10*3/uL — ABNORMAL HIGH (ref 1.5–6.5)
NEUT%: 77.3 % — AB (ref 39.0–75.0)
Platelets: 130 10*3/uL — ABNORMAL LOW (ref 140–400)
RBC: 3.84 10*6/uL — AB (ref 4.20–5.82)
RDW: 19.7 % — ABNORMAL HIGH (ref 11.0–14.6)
WBC: 16.4 10*3/uL — AB (ref 4.0–10.3)
lymph#: 2.6 10*3/uL (ref 0.9–3.3)

## 2013-11-22 LAB — CANCER ANTIGEN 19-9: CA 19-9: 3167.3 U/mL — ABNORMAL HIGH (ref <35.0)

## 2013-11-22 MED ORDER — DEXAMETHASONE 4 MG PO TABS: ORAL_TABLET | ORAL | Status: DC

## 2013-11-22 MED ORDER — ATROPINE SULFATE 1 MG/ML IJ SOLN
0.5000 mg | Freq: Once | INTRAMUSCULAR | Status: AC | PRN
  Administered 2013-11-22: 0.5 mg via INTRAVENOUS

## 2013-11-22 MED ORDER — POTASSIUM CHLORIDE CRYS ER 20 MEQ PO TBCR: 20.0000 meq | EXTENDED_RELEASE_TABLET | Freq: Every day | ORAL | Status: DC

## 2013-11-22 MED ORDER — DEXAMETHASONE SODIUM PHOSPHATE 20 MG/5ML IJ SOLN
12.0000 mg | Freq: Once | INTRAMUSCULAR | Status: AC
  Administered 2013-11-22: 12 mg via INTRAVENOUS

## 2013-11-22 MED ORDER — DEXTROSE 5 % IV SOLN
140.0000 mg/m2 | Freq: Once | INTRAVENOUS | Status: AC
  Administered 2013-11-22: 276 mg via INTRAVENOUS
  Filled 2013-11-22: qty 13.8

## 2013-11-22 MED ORDER — DEXAMETHASONE SODIUM PHOSPHATE 20 MG/5ML IJ SOLN
INTRAMUSCULAR | Status: AC
  Filled 2013-11-22: qty 5

## 2013-11-22 MED ORDER — PALONOSETRON HCL INJECTION 0.25 MG/5ML
INTRAVENOUS | Status: AC
  Filled 2013-11-22: qty 5

## 2013-11-22 MED ORDER — SODIUM CHLORIDE 0.9 % IV SOLN
150.0000 mg | Freq: Once | INTRAVENOUS | Status: AC
  Administered 2013-11-22: 150 mg via INTRAVENOUS
  Filled 2013-11-22: qty 5

## 2013-11-22 MED ORDER — DEXTROSE 5 % IV SOLN
Freq: Once | INTRAVENOUS | Status: AC
  Administered 2013-11-22: 11:00:00 via INTRAVENOUS

## 2013-11-22 MED ORDER — LEUCOVORIN CALCIUM INJECTION 350 MG
400.0000 mg/m2 | Freq: Once | INTRAVENOUS | Status: AC
  Administered 2013-11-22: 800 mg via INTRAVENOUS
  Filled 2013-11-22: qty 40

## 2013-11-22 MED ORDER — ATROPINE SULFATE 1 MG/ML IJ SOLN
INTRAMUSCULAR | Status: AC
  Filled 2013-11-22: qty 1

## 2013-11-22 MED ORDER — PALONOSETRON HCL INJECTION 0.25 MG/5ML
0.2500 mg | Freq: Once | INTRAVENOUS | Status: AC
  Administered 2013-11-22: 0.25 mg via INTRAVENOUS

## 2013-11-22 MED ORDER — OXALIPLATIN CHEMO INJECTION 100 MG/20ML
85.0000 mg/m2 | Freq: Once | INTRAVENOUS | Status: AC
  Administered 2013-11-22: 165 mg via INTRAVENOUS
  Filled 2013-11-22: qty 33

## 2013-11-22 MED ORDER — SODIUM CHLORIDE 0.9 % IV SOLN
2400.0000 mg/m2 | INTRAVENOUS | Status: DC
  Administered 2013-11-22: 4750 mg via INTRAVENOUS
  Filled 2013-11-22: qty 95

## 2013-11-22 NOTE — Telephone Encounter (Signed)
gv and printed appt sched and avs for pt for OCT and NOV....sed added tx. °

## 2013-11-22 NOTE — Progress Notes (Signed)
  [place] OFFICE PROGRESS NOTE   Diagnosis: Pancreas cancer  INTERVAL HISTORY:   Ray [name] returns as scheduled. He completed another cycle FOLFIRINOX on 11/08/2013. He has cold sensitivity and numbness for a few days following chemotherapy. This has resolved. Nausea is relieved with Compazine. The abdominal pain is much improved. He continues MS Contin and Percocet. He takes approximately 4 Percocet tablets per day. He reports an excellent appetite. No diarrhea.  Objective:  Vital signs in last 24 hours:  Blood pressure 148/95, pulse 86, temperature 98.9 F (37.2 C), temperature source Oral, resp. rate 18, height 5\' 10"  (1.778 m), weight 167 lb 6.4 oz (75.932 kg).    HEENT: [place] over the tongue, no buccal thrush or ulcers Resp: Lungs clear bilaterally Cardio: Regular in rhythm GI: No hepatomegaly, nontender, no mass Vascular: No leg edema Neuro: Mild decrease in vibratory sense at the right greater than left fingertip  Skin: Dryness with superficial desquamation and hyperpigmentation over the trunk   Portacath/PICC-without erythema  Lab Results:  Lab Results  Component Value Date   WBC 16.4* 11/22/2013   HGB 10.5* 11/22/2013   HCT 33.7* 11/22/2013   MCV 87.8 11/22/2013   PLT 130* 11/22/2013   NEUTROABS 12.6* 11/22/2013   CA 19-9 on 11/01/2013-4609   Medications: I have reviewed the patient's current medications.  Assessment/Plan: 1. Pancreas cancer. 07/06/2013 ,CA 19-9- 2067.  CT abdomen/pelvis 07/09/2013 with interval development of amorphous soft tissue circumferentially encasing the celiac axis and tracking caudally down toward the SMA. Associated stable irregular mild distention of the main pancreatic duct in the body and tail of the pancreas with no discrete mass lesion seen. 2.2 x 1.4 cm portacaval lymph node in the hepatoduodenal ligament had increased in size from 1.3 x 1.1 cm previously. Other scattered small lymph nodes seen in the  hepatoduodenal ligament.  Endoscopic ultrasound 07/15/2013 with a very vague masslike lesion in the region of the uncinate pancreas. Fine needle aspiration showed atypical cells consistent with adenocarcinoma.  PET scan 07/29/2013 with hypermetabolic soft tissue posterior to the pancreas with an adjacent hypermetabolic lymph node, no hypermetabolic pancreas mass, no evidence of distant metastatic disease  Cycle 1 FOLFIRINOX 08/02/2013.  Cycle 2 FOLFIRINOX 08/16/2013.  Cycle 3 FOLFIRINOX 09/06/2013.  Cycle 4 FOLFIRINOX 09/21/2013.  CA 19-9 improved on 10/04/2013.  Cycle 5 FOLFIRINOX 10/04/2013.  Cycle 6 FOLFIRINOX 10/18/2013  Restaging CT 10/22/2013 with no change in the soft tissue surrounding the pancreas/celiac axis, scattered indeterminate low-attenuation liver lesions are more conspicuous. Cycle 7 FOLFIRINOX 11/08/2013 2. Abdominal pain secondary to #1. 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 3. Hypertension. 4. History of gout. 5. Nausea and vomiting following FOLFIRINOX chemotherapy.  Aloxi and Emend added with cycle 2.  Decadron added with cycle 3. 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 7. Hospitalization 08/24/2013 through 08/27/2013 with poorly controlled nausea/vomiting. Diarrhea during the hospitalization.   Disposition:  Mr. Thayer appears stable. The CA19-9 was improved last month. The plan is to proceed with another cycle FOLFIRINOX today. He will return for an office visit and chemotherapy in 2 weeks. We will plan for a restaging CT evaluation after 10 cycles of FOLFIRINOX.  Betsy Coder, MD  11/22/2013  10:21 AM

## 2013-11-22 NOTE — Patient Instructions (Signed)
[facility] Discharge Instructions for Patients Receiving Chemotherapy  Today you received the following chemotherapy agents FOLFIRINOX  To help prevent nausea and vomiting after your treatment, we encourage you to take your nausea medication as directed.    If you develop nausea and vomiting that is not controlled by your nausea medication, call the clinic.   BELOW ARE SYMPTOMS THAT SHOULD BE REPORTED IMMEDIATELY:  *FEVER GREATER THAN 100.5 F  *CHILLS WITH OR WITHOUT FEVER  NAUSEA AND VOMITING THAT IS NOT CONTROLLED WITH YOUR NAUSEA MEDICATION  *UNUSUAL SHORTNESS OF BREATH  *UNUSUAL BRUISING OR BLEEDING  TENDERNESS IN MOUTH AND THROAT WITH OR WITHOUT PRESENCE OF ULCERS  *URINARY PROBLEMS  *BOWEL PROBLEMS  UNUSUAL RASH Items with * indicate a potential emergency and should be followed up as soon as possible.  Feel free to call the clinic you have any questions or concerns. The clinic phone number is [phone].

## 2013-11-23 ENCOUNTER — Other Ambulatory Visit: Payer: Self-pay | Admitting: *Deleted

## 2013-11-23 ENCOUNTER — Encounter: Payer: Self-pay | Admitting: Family Medicine

## 2013-11-23 ENCOUNTER — Ambulatory Visit (INDEPENDENT_AMBULATORY_CARE_PROVIDER_SITE_OTHER): Admitting: Family Medicine

## 2013-11-23 VITALS — BP 142/84 | HR 89 | Temp 98.3°F | Wt 170.0 lb

## 2013-11-23 DIAGNOSIS — Z0000 Encounter for general adult medical examination without abnormal findings: Secondary | ICD-10-CM

## 2013-11-23 DIAGNOSIS — Z13220 Encounter for screening for lipoid disorders: Secondary | ICD-10-CM

## 2013-11-23 DIAGNOSIS — Z125 Encounter for screening for malignant neoplasm of prostate: Secondary | ICD-10-CM

## 2013-11-23 DIAGNOSIS — F32A Depression, unspecified: Secondary | ICD-10-CM

## 2013-11-23 DIAGNOSIS — I10 Essential (primary) hypertension: Secondary | ICD-10-CM

## 2013-11-23 DIAGNOSIS — C257 Malignant neoplasm of other parts of pancreas: Secondary | ICD-10-CM

## 2013-11-23 DIAGNOSIS — F329 Major depressive disorder, single episode, unspecified: Secondary | ICD-10-CM

## 2013-11-23 MED ORDER — AMLODIPINE BESYLATE 10 MG PO TABS: 10.0000 mg | ORAL_TABLET | Freq: Every day | ORAL | Status: DC

## 2013-11-23 MED ORDER — DULOXETINE HCL 30 MG PO CPEP: 30.0000 mg | ORAL_CAPSULE | Freq: Every day | ORAL | Status: DC

## 2013-11-23 MED ORDER — MORPHINE SULFATE ER 30 MG PO TBCR: 30.0000 mg | EXTENDED_RELEASE_TABLET | Freq: Two times a day (BID) | ORAL | Status: DC

## 2013-11-23 NOTE — Telephone Encounter (Signed)
Forgot to request refill on MS Contin yesterday. He or wife will pick up.

## 2013-11-23 NOTE — Patient Instructions (Addendum)
Increase amlodipine to 10mg  daily Start cymbalta 30mg  daily Follow up in 1 month.  Be well, Dr. [name]     Health Maintenance A healthy lifestyle and preventative care can promote health and wellness.  Maintain regular health, dental, and eye exams.  Eat a healthy diet. Foods like vegetables, fruits, whole grains, low-fat dairy products, and lean protein foods contain the nutrients you need and are low in calories. Decrease your intake of foods high in solid fats, added sugars, and salt. Get information about a proper diet from your health care provider, if necessary.  Regular physical exercise is one of the most important things you can do for your health. Most adults should get at least 150 minutes of moderate-intensity exercise (any activity that increases your heart rate and causes you to sweat) each week. In addition, most adults need muscle-strengthening exercises on 2 or more days a week.   Maintain a healthy weight. The body mass index (BMI) is a screening tool to identify possible weight problems. It provides an estimate of body fat based on height and weight. Your health care provider can find your BMI and can help you achieve or maintain a healthy weight. For males 20 years and older:  A BMI below 18.5 is considered underweight.  A BMI of 18.5 to 24.9 is normal.  A BMI of 25 to 29.9 is considered overweight.  A BMI of 30 and above is considered obese.  Maintain normal blood lipids and cholesterol by exercising and minimizing your intake of saturated fat. Eat a balanced diet with plenty of fruits and vegetables. Blood tests for lipids and cholesterol should begin at age 25 and be repeated every 5 years. If your lipid or cholesterol levels are high, you are over age 44, or you are at high risk for heart disease, you may need your cholesterol levels checked more frequently.Ongoing high lipid and cholesterol levels should be treated with medicines if diet and exercise are not  working.  If you smoke, find out from your health care provider how to quit. If you do not use tobacco, do not start.  Lung cancer screening is recommended for adults aged 77-80 years who are at high risk for developing lung cancer because of a history of smoking. A yearly low-dose CT scan of the lungs is recommended for people who have at least a 30-pack-year history of smoking and are current smokers or have quit within the past 15 years. A pack year of smoking is smoking an average of 1 pack of cigarettes a day for 1 year (for example, a 30-pack-year history of smoking could mean smoking 1 pack a day for 30 years or 2 packs a day for 15 years). Yearly screening should continue until the smoker has stopped smoking for at least 15 years. Yearly screening should be stopped for people who develop a health problem that would prevent them from having lung cancer treatment.  If you choose to drink alcohol, do not have more than 2 drinks per day. One drink is considered to be 12 oz (360 mL) of beer, 5 oz (150 mL) of wine, or 1.5 oz (45 mL) of liquor.  Avoid the use of street drugs. Do not share needles with anyone. Ask for help if you need support or instructions about stopping the use of drugs.  High blood pressure causes heart disease and increases the risk of stroke. Blood pressure should be checked at least every 1-2 years. Ongoing high blood pressure should be treated  with medicines if weight loss and exercise are not effective.  If you are 73-57 years old, ask your health care provider if you should take aspirin to prevent heart disease.  Diabetes screening involves taking a blood sample to check your fasting blood sugar level. This should be done once every 3 years after age 57 if you are at a normal weight and without risk factors for diabetes. Testing should be considered at a younger age or be carried out more frequently if you are overweight and have at least 1 risk factor for  diabetes.  Colorectal cancer can be detected and often prevented. Most routine colorectal cancer screening begins at the age of 90 and continues through age 1. However, your health care provider may recommend screening at an earlier age if you have risk factors for colon cancer. On a yearly basis, your health care provider may provide home test kits to check for hidden blood in the stool. A small camera at the end of a tube may be used to directly examine the colon (sigmoidoscopy or colonoscopy) to detect the earliest forms of colorectal cancer. Talk to your health care provider about this at age 40 when routine screening begins. A direct exam of the colon should be repeated every 5-10 years through age 20, unless early forms of precancerous polyps or small growths are found.  People who are at an increased risk for hepatitis B should be screened for this virus. You are considered at high risk for hepatitis B if:  You were born in a country where hepatitis B occurs often. Talk with your health care provider about which countries are considered high risk.  Your parents were born in a high-risk country and you have not received a shot to protect against hepatitis B (hepatitis B vaccine).  You have HIV or AIDS.  You use needles to inject street drugs.  You live with, or have sex with, someone who has hepatitis B.  You are a man who has sex with other men (MSM).  You get hemodialysis treatment.  You take certain medicines for conditions like cancer, organ transplantation, and autoimmune conditions.  Hepatitis C blood testing is recommended for all people born from [birth year] through 1966 and any individual with known risk factors for hepatitis C.  Healthy men should no longer receive prostate-specific antigen (PSA) blood tests as part of routine cancer screening. Talk to your health care provider about prostate cancer screening.  Testicular cancer screening is not recommended for adolescents or  adult males who have no symptoms. Screening includes self-exam, a health care provider exam, and other screening tests. Consult with your health care provider about any symptoms you have or any concerns you have about testicular cancer.  Practice safe sex. Use condoms and avoid high-risk sexual practices to reduce the spread of sexually transmitted infections (STIs).  You should be screened for STIs, including gonorrhea and chlamydia if:  You are sexually active and are younger than 24 years.  You are older than 24 years, and your health care provider tells you that you are at risk for this type of infection.  Your sexual activity has changed since you were last screened, and you are at an increased risk for chlamydia or gonorrhea. Ask your health care provider if you are at risk.  If you are at risk of being infected with HIV, it is recommended that you take a prescription medicine daily to prevent HIV infection. This is called pre-exposure prophylaxis (PrEP). You are  considered at risk if:  You are a man who has sex with other men (MSM).  You are a heterosexual man who is sexually active with multiple partners.  You take drugs by injection.  You are sexually active with a partner who has HIV.  Talk with your health care provider about whether you are at high risk of being infected with HIV. If you choose to begin PrEP, you should first be tested for HIV. You should then be tested every 3 months for as long as you are taking PrEP.  Use sunscreen. Apply sunscreen liberally and repeatedly throughout the day. You should seek shade when your shadow is shorter than you. Protect yourself by wearing long sleeves, pants, a wide-brimmed hat, and sunglasses year round whenever you are outdoors.  Tell your health care provider of new moles or changes in moles, especially if there is a change in shape or color. Also, tell your health care provider if a mole is larger than the size of a pencil  eraser.  A one-time screening for abdominal aortic aneurysm (AAA) and surgical repair of large AAAs by ultrasound is recommended for men aged 66-75 years who are current or former smokers.  Stay current with your vaccines (immunizations). Document Released: 07/20/2007 Document Revised: 01/26/2013 Document Reviewed: 06/18/2010 [facility] Patient Information 2015 [place], [place]. This information is not intended to replace advice given to you by your health care provider. Make sure you discuss any questions you have with your health care provider.

## 2013-11-23 NOTE — Progress Notes (Signed)
Patient ID: Ray [name], male   DOB: 1955/05/20, 58 y.o.   MRN: [id number]  HPI:  Sh[place]on presents for a complete physical exam.  Mood - feels down/sad some days. Has been taking cymbalta on a prn basis (previous PCP had prescribed him this and he had some left over). If he feels down, takes it for a few days until he feels better, then stops. No SI/HI.   HTN - currently taking amlodipine 5mg  daily. No CP or SOB.  Tobacco use - smokes two cigars a day Alcohol - none Drugs - none, used marijuana and cocaine in the past (several decades ago), no hx of IV drug use  ROS: See HPI.  [place]: currently being treated with chemotherapy for pancreatic cancer. Family hx of cancer: Uncle with prostate cancer  PHYSICAL EXAM: BP 142/84  Pulse 89  Temp(Src) 98.3 F (36.8 C) (Oral)  Wt 170 lb (77.111 kg) Gen: NAD, pleasant, cooperative HEENT: NCAT, oropharynx clear and moist, PERRL, no anterior cervical LAD Heart: RRR, no murmurs Lungs: CTAB, NWOB Abd: soft, nontender to palpation Neuro: grossly nonfocal, speech normal, face symmetric, gait normal Ext: No appreciable lower extremity edema bilaterally  Psych: normal range of affect, well groomed, speech normal in rate and volume, normal eye contact   ASSESSMENT/PLAN:  Health maintenance:  -Had tetanus shot in 2010 -UTD on colonoscopy -had elevated PSA in April in setting of dx of splenic vein thrombosis which was subsequently determined to be due to pancreatic malignancy. Has uncle with hx of prostate cancer. Will have pt speak with his oncologist about whether additional workup is recommended for this elevated PSA in light of his current treatment for pancreatic cancer. Suspect that they will defer any further evaluation but I would like his oncologist to weigh in. Have also ordered repeat PSA to be drawn next time he gets labs. -check fasting lipids with next blood draw, orders entered.  Hypertension BP elevated above goal, will increase  back up to amlodipine 10mg  daily. F/u in 1 mo.  Depression Discussed greater efficacy of cymbalta when taken on a daily basis. Pt willing to start back on cymbalta 30mg  daily. Rx sent in. F/u in 1 month to evaluate for improvement.   FOLLOW UP: F/u in 1 mo for HTN & depression.  [place]. [name], [place]

## 2013-11-24 ENCOUNTER — Ambulatory Visit (HOSPITAL_BASED_OUTPATIENT_CLINIC_OR_DEPARTMENT_OTHER)

## 2013-11-24 ENCOUNTER — Ambulatory Visit

## 2013-11-24 VITALS — BP 133/87 | HR 72 | Temp 98.4°F

## 2013-11-24 DIAGNOSIS — Z08 Encounter for follow-up examination after completed treatment for malignant neoplasm: Secondary | ICD-10-CM

## 2013-11-24 DIAGNOSIS — C259 Malignant neoplasm of pancreas, unspecified: Secondary | ICD-10-CM

## 2013-11-24 DIAGNOSIS — Z8719 Personal history of other diseases of the digestive system: Secondary | ICD-10-CM

## 2013-11-24 DIAGNOSIS — C258 Malignant neoplasm of overlapping sites of pancreas: Secondary | ICD-10-CM

## 2013-11-24 MED ORDER — SODIUM CHLORIDE 0.9 % IJ SOLN
10.0000 mL | INTRAMUSCULAR | Status: DC | PRN
  Administered 2013-11-24: 10 mL
  Filled 2013-11-24: qty 10

## 2013-11-24 MED ORDER — HEPARIN SOD (PORK) LOCK FLUSH 100 UNIT/ML IV SOLN
500.0000 [IU] | Freq: Once | INTRAVENOUS | Status: AC | PRN
  Administered 2013-11-24: 500 [IU]
  Filled 2013-11-24: qty 5

## 2013-11-24 MED ORDER — PEGFILGRASTIM INJECTION 6 MG/0.6ML
6.0000 mg | Freq: Once | SUBCUTANEOUS | Status: AC
  Administered 2013-11-24: 6 mg via SUBCUTANEOUS
  Filled 2013-11-24: qty 0.6

## 2013-11-24 NOTE — Patient Instructions (Signed)
Pegfilgrastim injection What is this medicine? PEGFILGRASTIM (peg fil GRA stim) is a long-acting granulocyte colony-stimulating factor that stimulates the growth of neutrophils, a type of white blood cell important in the body's fight against infection. It is used to reduce the incidence of fever and infection in patients with certain types of cancer who are receiving chemotherapy that affects the bone marrow. This medicine may be used for other purposes; ask your health care provider or pharmacist if you have questions. COMMON BRAND NAME(S): Neulasta What should I tell my health care provider before I take this medicine? They need to know if you have any of these conditions: -latex allergy -ongoing radiation therapy -sickle cell disease -skin reactions to acrylic adhesives (On-Body Injector only) -an unusual or allergic reaction to pegfilgrastim, filgrastim, other medicines, foods, dyes, or preservatives -pregnant or trying to get pregnant -breast-feeding How should I use this medicine? This medicine is for injection under the skin. If you get this medicine at home, you will be taught how to prepare and give the pre-filled syringe or how to use the On-body Injector. Refer to the patient Instructions for Use for detailed instructions. Use exactly as directed. Take your medicine at regular intervals. Do not take your medicine more often than directed. It is important that you put your used needles and syringes in a special sharps container. Do not put them in a trash can. If you do not have a sharps container, call your pharmacist or healthcare provider to get one. Talk to your pediatrician regarding the use of this medicine in children. Special care may be needed. Overdosage: If you think you have taken too much of this medicine contact a poison control center or emergency room at once. NOTE: This medicine is only for you. Do not share this medicine with others. What if I miss a dose? It is  important not to miss your dose. Call your doctor or health care professional if you miss your dose. If you miss a dose due to an On-body Injector failure or leakage, a new dose should be administered as soon as possible using a single prefilled syringe for manual use. What may interact with this medicine? Interactions have not been studied. Give your health care provider a list of all the medicines, herbs, non-prescription drugs, or dietary supplements you use. Also tell them if you smoke, drink alcohol, or use illegal drugs. Some items may interact with your medicine. This list may not describe all possible interactions. Give your health care provider a list of all the medicines, herbs, non-prescription drugs, or dietary supplements you use. Also tell them if you smoke, drink alcohol, or use illegal drugs. Some items may interact with your medicine. What should I watch for while using this medicine? You may need blood work done while you are taking this medicine. If you are going to need a MRI, CT scan, or other procedure, tell your doctor that you are using this medicine (On-Body Injector only). What side effects may I notice from receiving this medicine? Side effects that you should report to your doctor or health care professional as soon as possible: -allergic reactions like skin rash, itching or hives, swelling of the face, lips, or tongue -dizziness -fever -pain, redness, or irritation at site where injected -pinpoint red spots on the skin -shortness of breath or breathing problems -stomach or side pain, or pain at the shoulder -swelling -tiredness -trouble passing urine Side effects that usually do not require medical attention (report to your doctor   or health care professional if they continue or are bothersome): -bone pain -muscle pain This list may not describe all possible side effects. Call your doctor for medical advice about side effects. You may report side effects to FDA at  1-800-FDA-1088. Where should I keep my medicine? Keep out of the reach of children. Store pre-filled syringes in a refrigerator between 2 and 8 degrees C (36 and 46 degrees F). Do not freeze. Keep in carton to protect from light. Throw away this medicine if it is left out of the refrigerator for more than 48 hours. Throw away any unused medicine after the expiration date. NOTE: This sheet is a summary. It may not cover all possible information. If you have questions about this medicine, talk to your doctor, pharmacist, or health care provider.  2015, [employer]/Gold Standard. (2013-04-22 16:14:05)  

## 2013-11-26 DIAGNOSIS — F32A Depression, unspecified: Secondary | ICD-10-CM | POA: Insufficient documentation

## 2013-11-26 DIAGNOSIS — F329 Major depressive disorder, single episode, unspecified: Secondary | ICD-10-CM | POA: Insufficient documentation

## 2013-11-26 NOTE — Assessment & Plan Note (Signed)
Discussed greater efficacy of cymbalta when taken on a daily basis. Pt willing to start back on cymbalta 30mg  daily. Rx sent in. F/u in 1 month to evaluate for improvement.

## 2013-11-26 NOTE — Assessment & Plan Note (Signed)
BP elevated above goal, will increase back up to amlodipine 10mg  daily. F/u in 1 mo.

## 2013-12-05 ENCOUNTER — Other Ambulatory Visit: Payer: Self-pay | Admitting: Oncology

## 2013-12-06 ENCOUNTER — Ambulatory Visit (HOSPITAL_BASED_OUTPATIENT_CLINIC_OR_DEPARTMENT_OTHER)

## 2013-12-06 ENCOUNTER — Ambulatory Visit (HOSPITAL_BASED_OUTPATIENT_CLINIC_OR_DEPARTMENT_OTHER): Admitting: Oncology

## 2013-12-06 ENCOUNTER — Telehealth: Payer: Self-pay | Admitting: Oncology

## 2013-12-06 ENCOUNTER — Other Ambulatory Visit (HOSPITAL_BASED_OUTPATIENT_CLINIC_OR_DEPARTMENT_OTHER)

## 2013-12-06 VITALS — BP 127/82 | HR 87 | Temp 98.7°F | Resp 18 | Ht 70.0 in | Wt 164.0 lb

## 2013-12-06 DIAGNOSIS — D735 Infarction of spleen: Secondary | ICD-10-CM

## 2013-12-06 DIAGNOSIS — R109 Unspecified abdominal pain: Secondary | ICD-10-CM

## 2013-12-06 DIAGNOSIS — C259 Malignant neoplasm of pancreas, unspecified: Secondary | ICD-10-CM

## 2013-12-06 DIAGNOSIS — I10 Essential (primary) hypertension: Secondary | ICD-10-CM

## 2013-12-06 DIAGNOSIS — C251 Malignant neoplasm of body of pancreas: Secondary | ICD-10-CM

## 2013-12-06 DIAGNOSIS — R112 Nausea with vomiting, unspecified: Secondary | ICD-10-CM

## 2013-12-06 DIAGNOSIS — Z5111 Encounter for antineoplastic chemotherapy: Secondary | ICD-10-CM

## 2013-12-06 DIAGNOSIS — C252 Malignant neoplasm of tail of pancreas: Secondary | ICD-10-CM

## 2013-12-06 DIAGNOSIS — K769 Liver disease, unspecified: Secondary | ICD-10-CM

## 2013-12-06 DIAGNOSIS — Z8719 Personal history of other diseases of the digestive system: Secondary | ICD-10-CM

## 2013-12-06 LAB — COMPREHENSIVE METABOLIC PANEL (CC13)
ALBUMIN: 3.4 g/dL — AB (ref 3.5–5.0)
ALT: 14 U/L (ref 0–55)
AST: 13 U/L (ref 5–34)
Alkaline Phosphatase: 161 U/L — ABNORMAL HIGH (ref 40–150)
Anion Gap: 11 mEq/L (ref 3–11)
BUN: 11 mg/dL (ref 7.0–26.0)
CO2: 25 mEq/L (ref 22–29)
CREATININE: 0.9 mg/dL (ref 0.7–1.3)
Calcium: 9 mg/dL (ref 8.4–10.4)
Chloride: 104 mEq/L (ref 98–109)
Glucose: 150 mg/dl — ABNORMAL HIGH (ref 70–140)
POTASSIUM: 4.2 meq/L (ref 3.5–5.1)
Sodium: 140 mEq/L (ref 136–145)
Total Bilirubin: 0.24 mg/dL (ref 0.20–1.20)
Total Protein: 6.2 g/dL — ABNORMAL LOW (ref 6.4–8.3)

## 2013-12-06 LAB — CBC WITH DIFFERENTIAL/PLATELET
BASO%: 0.4 % (ref 0.0–2.0)
Basophils Absolute: 0.1 10*3/uL (ref 0.0–0.1)
EOS%: 0 % (ref 0.0–7.0)
Eosinophils Absolute: 0 10*3/uL (ref 0.0–0.5)
HCT: 35.5 % — ABNORMAL LOW (ref 38.4–49.9)
HEMOGLOBIN: 10.9 g/dL — AB (ref 13.0–17.1)
LYMPH%: 15.2 % (ref 14.0–49.0)
MCH: 26.9 pg — AB (ref 27.2–33.4)
MCHC: 30.8 g/dL — ABNORMAL LOW (ref 32.0–36.0)
MCV: 87.2 fL (ref 79.3–98.0)
MONO#: 1.1 10*3/uL — ABNORMAL HIGH (ref 0.1–0.9)
MONO%: 6.9 % (ref 0.0–14.0)
NEUT#: 12 10*3/uL — ABNORMAL HIGH (ref 1.5–6.5)
NEUT%: 77.5 % — ABNORMAL HIGH (ref 39.0–75.0)
PLATELETS: 114 10*3/uL — AB (ref 140–400)
RBC: 4.07 10*6/uL — ABNORMAL LOW (ref 4.20–5.82)
RDW: 20.9 % — AB (ref 11.0–14.6)
WBC: 15.5 10*3/uL — ABNORMAL HIGH (ref 4.0–10.3)
lymph#: 2.4 10*3/uL (ref 0.9–3.3)

## 2013-12-06 LAB — CANCER ANTIGEN 19-9: CA 19-9: 3163.9 U/mL — ABNORMAL HIGH (ref <35.0)

## 2013-12-06 MED ORDER — DEXAMETHASONE SODIUM PHOSPHATE 20 MG/5ML IJ SOLN
12.0000 mg | Freq: Once | INTRAMUSCULAR | Status: AC
  Administered 2013-12-06: 12 mg via INTRAVENOUS

## 2013-12-06 MED ORDER — SODIUM CHLORIDE 0.9 % IV SOLN
150.0000 mg | Freq: Once | INTRAVENOUS | Status: AC
  Administered 2013-12-06: 150 mg via INTRAVENOUS
  Filled 2013-12-06: qty 5

## 2013-12-06 MED ORDER — PALONOSETRON HCL INJECTION 0.25 MG/5ML
0.2500 mg | Freq: Once | INTRAVENOUS | Status: AC
  Administered 2013-12-06: 0.25 mg via INTRAVENOUS

## 2013-12-06 MED ORDER — SODIUM CHLORIDE 0.9 % IV SOLN
2400.0000 mg/m2 | INTRAVENOUS | Status: DC
  Administered 2013-12-06: 4750 mg via INTRAVENOUS
  Filled 2013-12-06: qty 95

## 2013-12-06 MED ORDER — DEXTROSE 5 % IV SOLN
Freq: Once | INTRAVENOUS | Status: AC
  Administered 2013-12-06: 11:00:00 via INTRAVENOUS

## 2013-12-06 MED ORDER — OXALIPLATIN CHEMO INJECTION 100 MG/20ML
85.0000 mg/m2 | Freq: Once | INTRAVENOUS | Status: AC
  Administered 2013-12-06: 165 mg via INTRAVENOUS
  Filled 2013-12-06: qty 33

## 2013-12-06 MED ORDER — LORAZEPAM 2 MG/ML IJ SOLN
0.5000 mg | Freq: Once | INTRAMUSCULAR | Status: AC
  Administered 2013-12-06: 0.5 mg via INTRAVENOUS

## 2013-12-06 MED ORDER — PALONOSETRON HCL INJECTION 0.25 MG/5ML
INTRAVENOUS | Status: AC
Start: 2013-12-06 — End: 2013-12-06
  Filled 2013-12-06: qty 5

## 2013-12-06 MED ORDER — OXYCODONE-ACETAMINOPHEN 10-325 MG PO TABS: ORAL_TABLET | ORAL | Status: DC

## 2013-12-06 MED ORDER — LEUCOVORIN CALCIUM INJECTION 350 MG
400.0000 mg/m2 | Freq: Once | INTRAVENOUS | Status: AC
  Administered 2013-12-06: 800 mg via INTRAVENOUS
  Filled 2013-12-06: qty 40

## 2013-12-06 MED ORDER — DEXAMETHASONE SODIUM PHOSPHATE 20 MG/5ML IJ SOLN
INTRAMUSCULAR | Status: AC
  Filled 2013-12-06: qty 5

## 2013-12-06 MED ORDER — LORAZEPAM 2 MG/ML IJ SOLN
INTRAMUSCULAR | Status: AC
Start: 2013-12-06 — End: 2013-12-06
  Filled 2013-12-06: qty 1

## 2013-12-06 MED ORDER — ATROPINE SULFATE 1 MG/ML IJ SOLN
0.5000 mg | Freq: Once | INTRAMUSCULAR | Status: AC | PRN
  Administered 2013-12-06: 0.5 mg via INTRAVENOUS

## 2013-12-06 MED ORDER — IRINOTECAN HCL CHEMO INJECTION 100 MG/5ML
140.0000 mg/m2 | Freq: Once | INTRAVENOUS | Status: AC
  Administered 2013-12-06: 276 mg via INTRAVENOUS
  Filled 2013-12-06: qty 13.8

## 2013-12-06 MED ORDER — ATROPINE SULFATE 1 MG/ML IJ SOLN
INTRAMUSCULAR | Status: AC
  Filled 2013-12-06: qty 1

## 2013-12-06 NOTE — Progress Notes (Signed)
  [place] OFFICE PROGRESS NOTE   Diagnosis: pancreas cancer  INTERVAL HISTORY:   Ray [name] returns as scheduled. He completed another cycle FOLFIRINOX 11/22/2013. He reports mild nausea following chemotherapy that was relieved with Compazine. He takes approximately 4 Percocet tablets per day for abdominal pain. Mild numbness in the fingers for a few days following chemotherapy. No neuropathy symptoms today. Good appetite.  Objective:  Vital signs in last 24 hours:  Blood pressure 127/82, pulse 87, temperature 98.7 F (37.1 C), temperature source Oral, resp. rate 18, height 5\' 10"  (1.778 m), weight 164 lb (74.39 kg).    HEENT: no thrush or ulcers Resp: lungs clear bilaterally Cardio: regular rate and rhythm GI: no hepatomegaly, nontender, no mass Vascular: no leg edema Neuro:mild decrease in vibratory sense at the fingertips bilaterally  Skin:hyperpigmentation of the hands   Portacath/PICC-without erythema  Lab Results:  Lab Results  Component Value Date   WBC 15.5* 12/06/2013   HGB 10.9* 12/06/2013   HCT 35.5* 12/06/2013   MCV 87.2 12/06/2013   PLT 114* 12/06/2013   NEUTROABS 12.0* 12/06/2013    CA 19-9: 3164  Imaging:  No results found.  Medications: I have reviewed the patient's current medications.  Assessment/Plan: 1. Pancreas cancer.  07/06/2013 ,CA 19-9- 2067.   CT abdomen/pelvis 07/09/2013 with interval development of amorphous soft tissue circumferentially encasing the celiac axis and tracking caudally down toward the SMA. Associated stable irregular mild distention of the main pancreatic duct in the body and tail of the pancreas with no discrete mass lesion seen. 2.2 x 1.4 cm portacaval lymph node in the hepatoduodenal ligament had increased in size from 1.3 x 1.1 cm previously. Other scattered small lymph nodes seen in the hepatoduodenal ligament.   Endoscopic ultrasound 07/15/2013 with a very vague masslike lesion in the region of  the uncinate pancreas. Fine needle aspiration showed atypical cells consistent with adenocarcinoma.   PET scan 07/29/2013 with hypermetabolic soft tissue posterior to the pancreas with an adjacent hypermetabolic lymph node, no hypermetabolic pancreas mass, no evidence of distant metastatic disease   Cycle 1 FOLFIRINOX 08/02/2013.   Cycle 2 FOLFIRINOX 08/16/2013.   Cycle 3 FOLFIRINOX 09/06/2013.   Cycle 4 FOLFIRINOX 09/21/2013.   CA 19-9 improved on 10/04/2013.   Cycle 5 FOLFIRINOX 10/04/2013.   Cycle 6 FOLFIRINOX 10/18/2013   Restaging CT 10/22/2013 with no change in the soft tissue surrounding the pancreas/celiac axis, scattered indeterminate low-attenuation liver lesions are more conspicuous.  Cycle 7 FOLFIRINOX 11/08/2013  Cycle 8 FOLFIRINOX 11/22/2013 2. Abdominal pain secondary to #1.  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 3. Hypertension. 4. History of gout. 5. Nausea and vomiting following FOLFIRINOX chemotherapy.  Aloxi and Emend added with cycle 2.   Decadron added with cycle 3. 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 7. Hospitalization 08/24/2013 through 08/27/2013 with poorly controlled nausea/vomiting. Diarrhea during the hospitalization.  Disposition:  He has completed 8 cycles of FOLFIRINOX. The abdominal pain has improved and the CA 19-9 is lower. He will complete cycle 9 FOLFIRINOX today. We will consider discontinuing oxaliplatin when he returns for cycle 10 FOLFIRINOX in 2 weeks. Mr. Ricke will be referred for a restaging CT evaluation after 11 cycles of FOLFIRINOX.  Betsy Coder, MD  12/06/2013  7:02 PM

## 2013-12-06 NOTE — Patient Instructions (Signed)
[facility] Discharge Instructions for Patients Receiving Chemotherapy  Today you received the following chemotherapy agents: Oxaliplatin, Irinotecan, Leucovorin, and Adrucil.  To help prevent nausea and vomiting after your treatment, we encourage you to take your nausea medication as prescribed.   If you develop nausea and vomiting that is not controlled by your nausea medication, call the clinic.   BELOW ARE SYMPTOMS THAT SHOULD BE REPORTED IMMEDIATELY:  *FEVER GREATER THAN 100.5 F  *CHILLS WITH OR WITHOUT FEVER  NAUSEA AND VOMITING THAT IS NOT CONTROLLED WITH YOUR NAUSEA MEDICATION  *UNUSUAL SHORTNESS OF BREATH  *UNUSUAL BRUISING OR BLEEDING  TENDERNESS IN MOUTH AND THROAT WITH OR WITHOUT PRESENCE OF ULCERS  *URINARY PROBLEMS  *BOWEL PROBLEMS  UNUSUAL RASH Items with * indicate a potential emergency and should be followed up as soon as possible.  Feel free to call the clinic you have any questions or concerns. The clinic phone number is [phone].    

## 2013-12-06 NOTE — Telephone Encounter (Signed)
gv adn printed appt sched and avs fo rpt for NOV.....sed added tx. °

## 2013-12-08 ENCOUNTER — Ambulatory Visit

## 2013-12-08 ENCOUNTER — Ambulatory Visit (HOSPITAL_BASED_OUTPATIENT_CLINIC_OR_DEPARTMENT_OTHER)

## 2013-12-08 DIAGNOSIS — Z8719 Personal history of other diseases of the digestive system: Secondary | ICD-10-CM

## 2013-12-08 DIAGNOSIS — C252 Malignant neoplasm of tail of pancreas: Secondary | ICD-10-CM

## 2013-12-08 DIAGNOSIS — C259 Malignant neoplasm of pancreas, unspecified: Secondary | ICD-10-CM

## 2013-12-08 DIAGNOSIS — Z5189 Encounter for other specified aftercare: Secondary | ICD-10-CM

## 2013-12-08 DIAGNOSIS — C251 Malignant neoplasm of body of pancreas: Secondary | ICD-10-CM

## 2013-12-08 MED ORDER — HEPARIN SOD (PORK) LOCK FLUSH 100 UNIT/ML IV SOLN
500.0000 [IU] | Freq: Once | INTRAVENOUS | Status: AC | PRN
  Administered 2013-12-08: 500 [IU]
  Filled 2013-12-08: qty 5

## 2013-12-08 MED ORDER — PEGFILGRASTIM INJECTION 6 MG/0.6ML
6.0000 mg | Freq: Once | SUBCUTANEOUS | Status: AC
  Administered 2013-12-08: 6 mg via SUBCUTANEOUS
  Filled 2013-12-08: qty 0.6

## 2013-12-08 MED ORDER — SODIUM CHLORIDE 0.9 % IJ SOLN
10.0000 mL | INTRAMUSCULAR | Status: DC | PRN
  Administered 2013-12-08: 10 mL
  Filled 2013-12-08: qty 10

## 2013-12-08 NOTE — Patient Instructions (Signed)
Pegfilgrastim injection What is this medicine? PEGFILGRASTIM (peg fil GRA stim) is a long-acting granulocyte colony-stimulating factor that stimulates the growth of neutrophils, a type of white blood cell important in the body's fight against infection. It is used to reduce the incidence of fever and infection in patients with certain types of cancer who are receiving chemotherapy that affects the bone marrow. This medicine may be used for other purposes; ask your health care provider or pharmacist if you have questions. COMMON BRAND NAME(S): Neulasta What should I tell my health care provider before I take this medicine? They need to know if you have any of these conditions: -latex allergy -ongoing radiation therapy -sickle cell disease -skin reactions to acrylic adhesives (On-Body Injector only) -an unusual or allergic reaction to pegfilgrastim, filgrastim, other medicines, foods, dyes, or preservatives -pregnant or trying to get pregnant -breast-feeding How should I use this medicine? This medicine is for injection under the skin. If you get this medicine at home, you will be taught how to prepare and give the pre-filled syringe or how to use the On-body Injector. Refer to the patient Instructions for Use for detailed instructions. Use exactly as directed. Take your medicine at regular intervals. Do not take your medicine more often than directed. It is important that you put your used needles and syringes in a special sharps container. Do not put them in a trash can. If you do not have a sharps container, call your pharmacist or healthcare provider to get one. Talk to your pediatrician regarding the use of this medicine in children. Special care may be needed. Overdosage: If you think you have taken too much of this medicine contact a poison control center or emergency room at once. NOTE: This medicine is only for you. Do not share this medicine with others. What if I miss a dose? It is  important not to miss your dose. Call your doctor or health care professional if you miss your dose. If you miss a dose due to an On-body Injector failure or leakage, a new dose should be administered as soon as possible using a single prefilled syringe for manual use. What may interact with this medicine? Interactions have not been studied. Give your health care provider a list of all the medicines, herbs, non-prescription drugs, or dietary supplements you use. Also tell them if you smoke, drink alcohol, or use illegal drugs. Some items may interact with your medicine. This list may not describe all possible interactions. Give your health care provider a list of all the medicines, herbs, non-prescription drugs, or dietary supplements you use. Also tell them if you smoke, drink alcohol, or use illegal drugs. Some items may interact with your medicine. What should I watch for while using this medicine? You may need blood work done while you are taking this medicine. If you are going to need a MRI, CT scan, or other procedure, tell your doctor that you are using this medicine (On-Body Injector only). What side effects may I notice from receiving this medicine? Side effects that you should report to your doctor or health care professional as soon as possible: -allergic reactions like skin rash, itching or hives, swelling of the face, lips, or tongue -dizziness -fever -pain, redness, or irritation at site where injected -pinpoint red spots on the skin -shortness of breath or breathing problems -stomach or side pain, or pain at the shoulder -swelling -tiredness -trouble passing urine Side effects that usually do not require medical attention (report to your doctor   or health care professional if they continue or are bothersome): -bone pain -muscle pain This list may not describe all possible side effects. Call your doctor for medical advice about side effects. You may report side effects to FDA at  1-800-FDA-1088. Where should I keep my medicine? Keep out of the reach of children. Store pre-filled syringes in a refrigerator between 2 and 8 degrees C (36 and 46 degrees F). Do not freeze. Keep in carton to protect from light. Throw away this medicine if it is left out of the refrigerator for more than 48 hours. Throw away any unused medicine after the expiration date. NOTE: This sheet is a summary. It may not cover all possible information. If you have questions about this medicine, talk to your doctor, pharmacist, or health care provider.  2015, [employer]/Gold Standard. (2013-04-22 16:14:05)  

## 2013-12-19 ENCOUNTER — Other Ambulatory Visit: Payer: Self-pay | Admitting: Oncology

## 2013-12-20 ENCOUNTER — Other Ambulatory Visit (HOSPITAL_BASED_OUTPATIENT_CLINIC_OR_DEPARTMENT_OTHER)

## 2013-12-20 ENCOUNTER — Other Ambulatory Visit: Payer: Self-pay | Admitting: Family Medicine

## 2013-12-20 ENCOUNTER — Ambulatory Visit (HOSPITAL_BASED_OUTPATIENT_CLINIC_OR_DEPARTMENT_OTHER): Admitting: Nurse Practitioner

## 2013-12-20 ENCOUNTER — Ambulatory Visit (HOSPITAL_BASED_OUTPATIENT_CLINIC_OR_DEPARTMENT_OTHER)

## 2013-12-20 ENCOUNTER — Telehealth: Payer: Self-pay | Admitting: Oncology

## 2013-12-20 ENCOUNTER — Ambulatory Visit: Admitting: Nutrition

## 2013-12-20 VITALS — BP 144/89 | HR 84 | Temp 98.5°F | Resp 18 | Ht 70.0 in | Wt 165.1 lb

## 2013-12-20 DIAGNOSIS — C251 Malignant neoplasm of body of pancreas: Secondary | ICD-10-CM

## 2013-12-20 DIAGNOSIS — Z8719 Personal history of other diseases of the digestive system: Secondary | ICD-10-CM

## 2013-12-20 DIAGNOSIS — C259 Malignant neoplasm of pancreas, unspecified: Secondary | ICD-10-CM

## 2013-12-20 DIAGNOSIS — C252 Malignant neoplasm of tail of pancreas: Secondary | ICD-10-CM

## 2013-12-20 DIAGNOSIS — C257 Malignant neoplasm of other parts of pancreas: Secondary | ICD-10-CM

## 2013-12-20 DIAGNOSIS — Z5111 Encounter for antineoplastic chemotherapy: Secondary | ICD-10-CM

## 2013-12-20 LAB — CBC WITH DIFFERENTIAL/PLATELET
BASO%: 0.2 % (ref 0.0–2.0)
BASOS ABS: 0 10*3/uL (ref 0.0–0.1)
EOS ABS: 0 10*3/uL (ref 0.0–0.5)
EOS%: 0 % (ref 0.0–7.0)
HEMATOCRIT: 33.4 % — AB (ref 38.4–49.9)
HEMOGLOBIN: 10.6 g/dL — AB (ref 13.0–17.1)
LYMPH%: 15.5 % (ref 14.0–49.0)
MCH: 27 pg — ABNORMAL LOW (ref 27.2–33.4)
MCHC: 31.7 g/dL — ABNORMAL LOW (ref 32.0–36.0)
MCV: 85.2 fL (ref 79.3–98.0)
MONO#: 1.7 10*3/uL — AB (ref 0.1–0.9)
MONO%: 7.7 % (ref 0.0–14.0)
NEUT#: 16.4 10*3/uL — ABNORMAL HIGH (ref 1.5–6.5)
NEUT%: 76.6 % — AB (ref 39.0–75.0)
Platelets: 114 10*3/uL — ABNORMAL LOW (ref 140–400)
RBC: 3.92 10*6/uL — ABNORMAL LOW (ref 4.20–5.82)
RDW: 20.9 % — ABNORMAL HIGH (ref 11.0–14.6)
WBC: 21.5 10*3/uL — AB (ref 4.0–10.3)
lymph#: 3.3 10*3/uL (ref 0.9–3.3)
nRBC: 0 % (ref 0–0)

## 2013-12-20 LAB — COMPREHENSIVE METABOLIC PANEL (CC13)
ALT: 12 U/L (ref 0–55)
AST: 15 U/L (ref 5–34)
Albumin: 3.4 g/dL — ABNORMAL LOW (ref 3.5–5.0)
Alkaline Phosphatase: 165 U/L — ABNORMAL HIGH (ref 40–150)
Anion Gap: 9 mEq/L (ref 3–11)
BUN: 10 mg/dL (ref 7.0–26.0)
CALCIUM: 9 mg/dL (ref 8.4–10.4)
CO2: 26 meq/L (ref 22–29)
Chloride: 105 mEq/L (ref 98–109)
Creatinine: 0.8 mg/dL (ref 0.7–1.3)
GLUCOSE: 104 mg/dL (ref 70–140)
POTASSIUM: 3.7 meq/L (ref 3.5–5.1)
Sodium: 141 mEq/L (ref 136–145)
Total Bilirubin: 0.32 mg/dL (ref 0.20–1.20)
Total Protein: 6.1 g/dL — ABNORMAL LOW (ref 6.4–8.3)

## 2013-12-20 LAB — TECHNOLOGIST REVIEW

## 2013-12-20 MED ORDER — IRINOTECAN HCL CHEMO INJECTION 100 MG/5ML
140.0000 mg/m2 | Freq: Once | INTRAVENOUS | Status: AC
  Administered 2013-12-20: 276 mg via INTRAVENOUS
  Filled 2013-12-20: qty 13.8

## 2013-12-20 MED ORDER — DEXAMETHASONE SODIUM PHOSPHATE 20 MG/5ML IJ SOLN
INTRAMUSCULAR | Status: AC
  Filled 2013-12-20: qty 5

## 2013-12-20 MED ORDER — PALONOSETRON HCL INJECTION 0.25 MG/5ML
INTRAVENOUS | Status: AC
  Filled 2013-12-20: qty 5

## 2013-12-20 MED ORDER — DEXAMETHASONE SODIUM PHOSPHATE 20 MG/5ML IJ SOLN
12.0000 mg | Freq: Once | INTRAMUSCULAR | Status: AC
  Administered 2013-12-20: 12 mg via INTRAVENOUS

## 2013-12-20 MED ORDER — ATROPINE SULFATE 1 MG/ML IJ SOLN
INTRAMUSCULAR | Status: AC
  Filled 2013-12-20: qty 1

## 2013-12-20 MED ORDER — SODIUM CHLORIDE 0.9 % IV SOLN
INTRAVENOUS | Status: DC
  Administered 2013-12-20: 11:00:00 via INTRAVENOUS

## 2013-12-20 MED ORDER — ATROPINE SULFATE 1 MG/ML IJ SOLN
0.5000 mg | Freq: Once | INTRAMUSCULAR | Status: AC | PRN
  Administered 2013-12-20: 0.5 mg via INTRAVENOUS

## 2013-12-20 MED ORDER — SODIUM CHLORIDE 0.9 % IV SOLN
150.0000 mg | Freq: Once | INTRAVENOUS | Status: AC
  Administered 2013-12-20: 150 mg via INTRAVENOUS
  Filled 2013-12-20: qty 5

## 2013-12-20 MED ORDER — MORPHINE SULFATE ER 30 MG PO TBCR: 30.0000 mg | EXTENDED_RELEASE_TABLET | Freq: Two times a day (BID) | ORAL | Status: DC

## 2013-12-20 MED ORDER — PALONOSETRON HCL INJECTION 0.25 MG/5ML
0.2500 mg | Freq: Once | INTRAVENOUS | Status: AC
  Administered 2013-12-20: 0.25 mg via INTRAVENOUS

## 2013-12-20 MED ORDER — SODIUM CHLORIDE 0.9 % IV SOLN
2400.0000 mg/m2 | INTRAVENOUS | Status: DC
  Administered 2013-12-20: 4750 mg via INTRAVENOUS
  Filled 2013-12-20: qty 95

## 2013-12-20 MED ORDER — LEUCOVORIN CALCIUM INJECTION 350 MG
400.0000 mg/m2 | Freq: Once | INTRAVENOUS | Status: AC
  Administered 2013-12-20: 800 mg via INTRAVENOUS
  Filled 2013-12-20: qty 40

## 2013-12-20 NOTE — Progress Notes (Signed)
  [place] OFFICE PROGRESS NOTE   Diagnosis:  Pancreas cancer  INTERVAL HISTORY:   Mr. Wyndham returns as scheduled. He completed cycle 9 FOLFIRINOX 12/06/2013. He noted increased fatigue and nausea following chemotherapy. Stools are loose. No frank diarrhea. Cold sensitivity lasted 5-6 days. He has stable numbness/tingling in the left hand. He describes his appetite as "great". Abdominal pain is better. He estimates taking Percocet 3 times a day.  Objective:  Vital signs in last 24 hours:  Blood pressure 144/89, pulse 84, temperature 98.5 F (36.9 C), temperature source Oral, resp. rate 18, height 5\' 10"  (1.778 m), weight 165 lb 1.6 oz (74.889 kg), SpO2 99 %.    HEENT: thrush or ulcers. Resp: lungs clear bilaterally. Cardio: regular rate and rhythm. GI: abdomen soft and nontender. No hepatomegaly. No mass. Vascular: no leg edema. Neuro:moderate decrease in vibratory sense over the fingertips bilaterally.  Skin:dry appearing.  Port-A-Cath without erythema.   Lab Results:  Lab Results  Component Value Date   WBC 21.5* 12/20/2013   HGB 10.6* 12/20/2013   HCT 33.4* 12/20/2013   MCV 85.2 12/20/2013   PLT 114* 12/20/2013   NEUTROABS 16.4* 12/20/2013    Imaging:  No results found.  Medications: I have reviewed the patient's current medications.  Assessment/Plan: 1. Pancreas cancer.  07/06/2013 ,CA 19-9- 2067.   CT abdomen/pelvis 07/09/2013 with interval development of amorphous soft tissue circumferentially encasing the celiac axis and tracking caudally down toward the SMA. Associated stable irregular mild distention of the main pancreatic duct in the body and tail of the pancreas with no discrete mass lesion seen. 2.2 x 1.4 cm portacaval lymph node in the hepatoduodenal ligament had increased in size from 1.3 x 1.1 cm previously. Other scattered small lymph nodes seen in the hepatoduodenal ligament.   Endoscopic ultrasound 07/15/2013 with a very vague  masslike lesion in the region of the uncinate pancreas. Fine needle aspiration showed atypical cells consistent with adenocarcinoma.   PET scan 07/29/2013 with hypermetabolic soft tissue posterior to the pancreas with an adjacent hypermetabolic lymph node, no hypermetabolic pancreas mass, no evidence of distant metastatic disease   Cycle 1 FOLFIRINOX 08/02/2013.   Cycle 2 FOLFIRINOX 08/16/2013.   Cycle 3 FOLFIRINOX 09/06/2013.   Cycle 4 FOLFIRINOX 09/21/2013.   CA 19-9 improved on 10/04/2013.   Cycle 5 FOLFIRINOX 10/04/2013.   Cycle 6 FOLFIRINOX 10/18/2013   Restaging CT 10/22/2013 with no change in the soft tissue surrounding the pancreas/celiac axis, scattered indeterminate low-attenuation liver lesions are more conspicuous.  Cycle 7 FOLFIRINOX 11/08/2013  Cycle 8 FOLFIRINOX 11/22/2013  Cycle 9 FOLFIRINOX 12/06/2013 2. Abdominal pain secondary to #1.  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 3. Hypertension. 4. History of gout. 5. Nausea and vomiting following FOLFIRINOX chemotherapy.  Aloxi and Emend added with cycle 2.   Decadron added with cycle 3. 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 7. Hospitalization 08/24/2013 through 08/27/2013 with poorly controlled nausea/vomiting. Diarrhea during the hospitalization.   Disposition:Mr. Mcglothen appears stable. He has completed 9 cycles of FOLFIRINOX. Plan to proceed with cycle 10 today as scheduled. Oxaliplatin will be eliminated from the regimen beginning today. Restaging CT evaluation is planned after cycle 11. Mr. Hellmer will return for a followup visit and cycle 11 in 2 weeks. He will contact the office in the interim with any problems.  Plan reviewed with [name].    Ned Card ANP/GNP-BC   12/20/2013  9:57 AM

## 2013-12-20 NOTE — Telephone Encounter (Signed)
gv pt appt schedule for nov/dec. [facility] will contact pt re ct - pt aware.

## 2013-12-20 NOTE — Progress Notes (Signed)
Patient requesting basic nutrition information on how to gain weight.  Patient reports he has a hearty appetite and has been eating well.  Weight has improved, however, he is not back to his normal weighed yet.  Educated patient on strategies for adding healthy fats, proteins and carbohydrates to ensure slow weight gain.  Questions were answered.  Teach back method used.  Contact information was given.  Patient expresses appreciation.  **Disclaimer: This note was dictated with voice recognition software. Similar sounding words can inadvertently be transcribed and this note may contain transcription errors which may not have been corrected upon publication of note.**

## 2013-12-20 NOTE — Patient Instructions (Signed)
[place] Discharge Instructions for Patients Receiving Chemotherapy  Today you received the following chemotherapy agents: Camptosar, 5 FU, Leucovorin  To help prevent nausea and vomiting after your treatment, we encourage you to take your nausea medications as directed:  Compazine 10 mg every 6 hours as needed  Decadron 8 mg twice daily X 3 days (start tomorrow). If you develop nausea and vomiting that is not controlled by your nausea medication, call the clinic.   BELOW ARE SYMPTOMS THAT SHOULD BE REPORTED IMMEDIATELY:  *FEVER GREATER THAN 100.5 F  *CHILLS WITH OR WITHOUT FEVER  NAUSEA AND VOMITING THAT IS NOT CONTROLLED WITH YOUR NAUSEA MEDICATION  *UNUSUAL SHORTNESS OF BREATH  *UNUSUAL BRUISING OR BLEEDING  TENDERNESS IN MOUTH AND THROAT WITH OR WITHOUT PRESENCE OF ULCERS  *URINARY PROBLEMS  *BOWEL PROBLEMS  UNUSUAL RASH Items with * indicate a potential emergency and should be followed up as soon as possible.  Feel free to call the clinic should you have any questions or concerns. The clinic phone number is [phone].  It has been a pleasure to serve you today!!!

## 2013-12-22 ENCOUNTER — Ambulatory Visit (HOSPITAL_BASED_OUTPATIENT_CLINIC_OR_DEPARTMENT_OTHER)

## 2013-12-22 DIAGNOSIS — Z5189 Encounter for other specified aftercare: Secondary | ICD-10-CM

## 2013-12-22 DIAGNOSIS — C252 Malignant neoplasm of tail of pancreas: Secondary | ICD-10-CM

## 2013-12-22 DIAGNOSIS — Z8719 Personal history of other diseases of the digestive system: Secondary | ICD-10-CM

## 2013-12-22 DIAGNOSIS — C251 Malignant neoplasm of body of pancreas: Secondary | ICD-10-CM

## 2013-12-22 DIAGNOSIS — C259 Malignant neoplasm of pancreas, unspecified: Secondary | ICD-10-CM

## 2013-12-22 MED ORDER — PEGFILGRASTIM INJECTION 6 MG/0.6ML
6.0000 mg | Freq: Once | SUBCUTANEOUS | Status: AC
  Administered 2013-12-22: 6 mg via SUBCUTANEOUS
  Filled 2013-12-22: qty 0.6

## 2013-12-22 MED ORDER — SODIUM CHLORIDE 0.9 % IJ SOLN
10.0000 mL | INTRAMUSCULAR | Status: DC | PRN
  Administered 2013-12-22: 10 mL
  Filled 2013-12-22: qty 10

## 2013-12-22 MED ORDER — HEPARIN SOD (PORK) LOCK FLUSH 100 UNIT/ML IV SOLN
500.0000 [IU] | Freq: Once | INTRAVENOUS | Status: AC | PRN
  Administered 2013-12-22: 500 [IU]
  Filled 2013-12-22: qty 5

## 2013-12-31 ENCOUNTER — Other Ambulatory Visit: Payer: Self-pay | Admitting: Oncology

## 2014-01-03 ENCOUNTER — Ambulatory Visit (HOSPITAL_BASED_OUTPATIENT_CLINIC_OR_DEPARTMENT_OTHER): Admitting: Nurse Practitioner

## 2014-01-03 ENCOUNTER — Telehealth: Payer: Self-pay | Admitting: Nurse Practitioner

## 2014-01-03 ENCOUNTER — Telehealth: Payer: Self-pay | Admitting: *Deleted

## 2014-01-03 ENCOUNTER — Ambulatory Visit (HOSPITAL_BASED_OUTPATIENT_CLINIC_OR_DEPARTMENT_OTHER)

## 2014-01-03 ENCOUNTER — Other Ambulatory Visit (HOSPITAL_BASED_OUTPATIENT_CLINIC_OR_DEPARTMENT_OTHER)

## 2014-01-03 VITALS — BP 142/85 | HR 95 | Temp 98.9°F | Resp 18 | Ht 70.0 in | Wt 163.3 lb

## 2014-01-03 DIAGNOSIS — C259 Malignant neoplasm of pancreas, unspecified: Secondary | ICD-10-CM

## 2014-01-03 DIAGNOSIS — C251 Malignant neoplasm of body of pancreas: Secondary | ICD-10-CM

## 2014-01-03 DIAGNOSIS — C252 Malignant neoplasm of tail of pancreas: Secondary | ICD-10-CM

## 2014-01-03 DIAGNOSIS — Z5111 Encounter for antineoplastic chemotherapy: Secondary | ICD-10-CM

## 2014-01-03 DIAGNOSIS — C257 Malignant neoplasm of other parts of pancreas: Secondary | ICD-10-CM

## 2014-01-03 DIAGNOSIS — Z8719 Personal history of other diseases of the digestive system: Secondary | ICD-10-CM

## 2014-01-03 LAB — COMPREHENSIVE METABOLIC PANEL (CC13)
ALK PHOS: 179 U/L — AB (ref 40–150)
ALT: 10 U/L (ref 0–55)
AST: 13 U/L (ref 5–34)
Albumin: 3.5 g/dL (ref 3.5–5.0)
Anion Gap: 11 mEq/L (ref 3–11)
BILIRUBIN TOTAL: 0.28 mg/dL (ref 0.20–1.20)
BUN: 11.5 mg/dL (ref 7.0–26.0)
CALCIUM: 9 mg/dL (ref 8.4–10.4)
CHLORIDE: 103 meq/L (ref 98–109)
CO2: 28 mEq/L (ref 22–29)
CREATININE: 0.9 mg/dL (ref 0.7–1.3)
Glucose: 103 mg/dl (ref 70–140)
Potassium: 4.3 mEq/L (ref 3.5–5.1)
Sodium: 142 mEq/L (ref 136–145)
Total Protein: 6.1 g/dL — ABNORMAL LOW (ref 6.4–8.3)

## 2014-01-03 LAB — CBC WITH DIFFERENTIAL/PLATELET
BASO%: 0.9 % (ref 0.0–2.0)
Basophils Absolute: 0.2 10*3/uL — ABNORMAL HIGH (ref 0.0–0.1)
EOS%: 0.2 % (ref 0.0–7.0)
Eosinophils Absolute: 0 10*3/uL (ref 0.0–0.5)
HEMATOCRIT: 35.1 % — AB (ref 38.4–49.9)
HGB: 10.8 g/dL — ABNORMAL LOW (ref 13.0–17.1)
LYMPH#: 3.2 10*3/uL (ref 0.9–3.3)
LYMPH%: 15.4 % (ref 14.0–49.0)
MCH: 26.2 pg — AB (ref 27.2–33.4)
MCHC: 30.8 g/dL — ABNORMAL LOW (ref 32.0–36.0)
MCV: 85.1 fL (ref 79.3–98.0)
MONO#: 1 10*3/uL — ABNORMAL HIGH (ref 0.1–0.9)
MONO%: 4.6 % (ref 0.0–14.0)
NEUT#: 16.2 10*3/uL — ABNORMAL HIGH (ref 1.5–6.5)
NEUT%: 78.9 % — AB (ref 39.0–75.0)
Platelets: 129 10*3/uL — ABNORMAL LOW (ref 140–400)
RBC: 4.12 10*6/uL — ABNORMAL LOW (ref 4.20–5.82)
RDW: 22.6 % — ABNORMAL HIGH (ref 11.0–14.6)
WBC: 20.6 10*3/uL — ABNORMAL HIGH (ref 4.0–10.3)

## 2014-01-03 MED ORDER — LOPERAMIDE HCL 2 MG PO CAPS: 2.0000 mg | ORAL_CAPSULE | ORAL | Status: DC | PRN

## 2014-01-03 MED ORDER — ATROPINE SULFATE 1 MG/ML IJ SOLN
INTRAMUSCULAR | Status: AC
  Filled 2014-01-03: qty 1

## 2014-01-03 MED ORDER — DEXAMETHASONE SODIUM PHOSPHATE 20 MG/5ML IJ SOLN
12.0000 mg | Freq: Once | INTRAMUSCULAR | Status: AC
  Administered 2014-01-03: 12 mg via INTRAVENOUS

## 2014-01-03 MED ORDER — IRINOTECAN HCL CHEMO INJECTION 100 MG/5ML
140.0000 mg/m2 | Freq: Once | INTRAVENOUS | Status: AC
  Administered 2014-01-03: 276 mg via INTRAVENOUS
  Filled 2014-01-03: qty 13.8

## 2014-01-03 MED ORDER — SODIUM CHLORIDE 0.9 % IV SOLN
Freq: Once | INTRAVENOUS | Status: AC
  Administered 2014-01-03: 11:00:00 via INTRAVENOUS

## 2014-01-03 MED ORDER — PALONOSETRON HCL INJECTION 0.25 MG/5ML
0.2500 mg | Freq: Once | INTRAVENOUS | Status: AC
  Administered 2014-01-03: 0.25 mg via INTRAVENOUS

## 2014-01-03 MED ORDER — MORPHINE SULFATE ER 30 MG PO TBCR: 30.0000 mg | EXTENDED_RELEASE_TABLET | Freq: Two times a day (BID) | ORAL | Status: DC

## 2014-01-03 MED ORDER — FOSAPREPITANT DIMEGLUMINE INJECTION 150 MG
150.0000 mg | Freq: Once | INTRAVENOUS | Status: AC
  Administered 2014-01-03: 150 mg via INTRAVENOUS
  Filled 2014-01-03: qty 5

## 2014-01-03 MED ORDER — ATROPINE SULFATE 1 MG/ML IJ SOLN
0.5000 mg | Freq: Once | INTRAMUSCULAR | Status: AC | PRN
  Administered 2014-01-03: 0.5 mg via INTRAVENOUS

## 2014-01-03 MED ORDER — PROCHLORPERAZINE MALEATE 10 MG PO TABS: 10.0000 mg | ORAL_TABLET | Freq: Four times a day (QID) | ORAL | Status: DC | PRN

## 2014-01-03 MED ORDER — SODIUM CHLORIDE 0.9 % IV SOLN
2400.0000 mg/m2 | INTRAVENOUS | Status: DC
  Administered 2014-01-03: 4750 mg via INTRAVENOUS
  Filled 2014-01-03: qty 95

## 2014-01-03 MED ORDER — LEUCOVORIN CALCIUM INJECTION 350 MG
400.0000 mg/m2 | Freq: Once | INTRAVENOUS | Status: AC
  Administered 2014-01-03: 800 mg via INTRAVENOUS
  Filled 2014-01-03: qty 40

## 2014-01-03 MED ORDER — PALONOSETRON HCL INJECTION 0.25 MG/5ML
INTRAVENOUS | Status: AC
  Filled 2014-01-03: qty 5

## 2014-01-03 MED ORDER — DEXAMETHASONE SODIUM PHOSPHATE 20 MG/5ML IJ SOLN
INTRAMUSCULAR | Status: AC
  Filled 2014-01-03: qty 5

## 2014-01-03 MED ORDER — OXYCODONE-ACETAMINOPHEN 10-325 MG PO TABS: ORAL_TABLET | ORAL | Status: DC

## 2014-01-03 NOTE — Telephone Encounter (Signed)
Per staff message and POF I have scheduled appts. Advised scheduler of appts. JMW  

## 2014-01-03 NOTE — Telephone Encounter (Signed)
Gave avs & cal for Dec/Jan. Sent mess to sch tx °

## 2014-01-03 NOTE — Patient Instructions (Signed)
[facility] Discharge Instructions for Patients Receiving Chemotherapy  Today you received the following chemotherapy agents:  Camptosar, Leucovorin and 5FU  To help prevent nausea and vomiting after your treatment, we encourage you to take your nausea medication as ordered per MD.   If you develop nausea and vomiting that is not controlled by your nausea medication, call the clinic.   BELOW ARE SYMPTOMS THAT SHOULD BE REPORTED IMMEDIATELY:  *FEVER GREATER THAN 100.5 F  *CHILLS WITH OR WITHOUT FEVER  NAUSEA AND VOMITING THAT IS NOT CONTROLLED WITH YOUR NAUSEA MEDICATION  *UNUSUAL SHORTNESS OF BREATH  *UNUSUAL BRUISING OR BLEEDING  TENDERNESS IN MOUTH AND THROAT WITH OR WITHOUT PRESENCE OF ULCERS  *URINARY PROBLEMS  *BOWEL PROBLEMS  UNUSUAL RASH Items with * indicate a potential emergency and should be followed up as soon as possible.  Feel free to call the clinic you have any questions or concerns. The clinic phone number is [phone].    

## 2014-01-03 NOTE — Progress Notes (Signed)
  [name]   Diagnosis:  Pancreas cancer  INTERVAL HISTORY:   [name] returns as scheduled. He completed cycle 11 FOLFIRI (oxaliplatin eliminated from regimen) 12/20/2013. He denies nausea/vomiting. No mouth sores. No diarrhea. He notes increased tingling in the hands and feet. He has intermittent pain at the left upper abdomen. He continues MS Contin with Percocet as needed.  Objective:  Vital signs in last 24 hours:  Blood pressure 142/85, pulse 95, temperature 98.9 F (37.2 C), temperature source Oral, resp. rate 18, height 5\' 10"  (1.778 m), weight 163 lb 4.8 oz (74.072 kg), SpO2 99 %.    HEENT: white coating over tongue. No ulcerations. Resp: lungs clear bilaterally. Cardio: regular rate and rhythm. GI: abdomen soft and nontender. No hepatomegaly. No mass. Vascular: no leg edema. Skin: generalized dryness. Port-A-Cath without erythema.    Lab Results:  Lab Results  Component Value Date   WBC 20.6* 01/03/2014   HGB 10.8* 01/03/2014   HCT 35.1* 01/03/2014   MCV 85.1 01/03/2014   PLT 129* 01/03/2014   NEUTROABS 16.2* 01/03/2014    Imaging:  No results found.  Medications: I have reviewed the patient's current medications.  Assessment/Plan: 1. Pancreas cancer.  07/06/2013 ,CA 19-9- 2067.   CT abdomen/pelvis 07/09/2013 with interval development of amorphous soft tissue circumferentially encasing the celiac axis and tracking caudally down toward the SMA. Associated stable irregular mild distention of the main pancreatic duct in the body and tail of the pancreas with no discrete mass lesion seen. 2.2 x 1.4 cm portacaval lymph node in the hepatoduodenal ligament had increased in size from 1.3 x 1.1 cm previously. Other scattered small lymph nodes seen in the hepatoduodenal ligament.   Endoscopic ultrasound 07/15/2013 with a very vague masslike lesion in the region of the uncinate pancreas. Fine needle aspiration showed atypical  cells consistent with adenocarcinoma.   PET scan 07/29/2013 with hypermetabolic soft tissue posterior to the pancreas with an adjacent hypermetabolic lymph node, no hypermetabolic pancreas mass, no evidence of distant metastatic disease   Cycle 1 FOLFIRINOX 08/02/2013.   Cycle 2 FOLFIRINOX 08/16/2013.   Cycle 3 FOLFIRINOX 09/06/2013.   Cycle 4 FOLFIRINOX 09/21/2013.   CA 19-9 improved on 10/04/2013.   Cycle 5 FOLFIRINOX 10/04/2013.   Cycle 6 FOLFIRINOX 10/18/2013   Restaging CT 10/22/2013 with no change in the soft tissue surrounding the pancreas/celiac axis, scattered indeterminate low-attenuation liver lesions are more conspicuous.  Cycle 7 FOLFIRINOX 11/08/2013  Cycle 8 FOLFIRINOX 11/22/2013  Cycle 9 FOLFIRINOX 12/06/2013  Cycle 10 FOLFIRI (oxaliplatin eliminated) 12/20/2013. 2. Abdominal pain secondary to #1.  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 3. Hypertension. 4. History of gout. 5. Nausea and vomiting following FOLFIRINOX chemotherapy.  Aloxi and Emend added with cycle 2.   Decadron added with cycle 3. 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 7. Hospitalization 08/24/2013 through 08/27/2013 with poorly controlled nausea/vomiting. Diarrhea during the hospitalization.   Disposition: Ray [name] appears stable. He has completed 10 cycles of systemic therapy. Plan to proceed with cycle 11 FOLFIRI today as scheduled. Restaging CT evaluation is planned on 01/12/2014. He will return for a followup visit on 01/14/2014. He will contact the office in the interim with any problems.  Ned Card ANP/GNP-BC   01/03/2014  10:40 AM

## 2014-01-04 LAB — CANCER ANTIGEN 19-9: CA 19-9: 2681.9 U/mL — ABNORMAL HIGH (ref <35.0)

## 2014-01-05 ENCOUNTER — Ambulatory Visit (HOSPITAL_BASED_OUTPATIENT_CLINIC_OR_DEPARTMENT_OTHER)

## 2014-01-05 ENCOUNTER — Ambulatory Visit

## 2014-01-05 DIAGNOSIS — Z5189 Encounter for other specified aftercare: Secondary | ICD-10-CM

## 2014-01-05 DIAGNOSIS — C259 Malignant neoplasm of pancreas, unspecified: Secondary | ICD-10-CM

## 2014-01-05 DIAGNOSIS — Z8719 Personal history of other diseases of the digestive system: Secondary | ICD-10-CM

## 2014-01-05 DIAGNOSIS — C252 Malignant neoplasm of tail of pancreas: Secondary | ICD-10-CM

## 2014-01-05 DIAGNOSIS — C251 Malignant neoplasm of body of pancreas: Secondary | ICD-10-CM

## 2014-01-05 MED ORDER — HEPARIN SOD (PORK) LOCK FLUSH 100 UNIT/ML IV SOLN
500.0000 [IU] | Freq: Once | INTRAVENOUS | Status: AC | PRN
  Administered 2014-01-05: 500 [IU]
  Filled 2014-01-05: qty 5

## 2014-01-05 MED ORDER — PEGFILGRASTIM INJECTION 6 MG/0.6ML
6.0000 mg | Freq: Once | SUBCUTANEOUS | Status: AC
  Administered 2014-01-05: 6 mg via SUBCUTANEOUS
  Filled 2014-01-05: qty 0.6

## 2014-01-05 MED ORDER — SODIUM CHLORIDE 0.9 % IJ SOLN
10.0000 mL | INTRAMUSCULAR | Status: DC | PRN
  Administered 2014-01-05: 10 mL
  Filled 2014-01-05: qty 10

## 2014-01-05 NOTE — Patient Instructions (Signed)
Pegfilgrastim injection What is this medicine? PEGFILGRASTIM (peg fil GRA stim) is a long-acting granulocyte colony-stimulating factor that stimulates the growth of neutrophils, a type of white blood cell important in the body's fight against infection. It is used to reduce the incidence of fever and infection in patients with certain types of cancer who are receiving chemotherapy that affects the bone marrow. This medicine may be used for other purposes; ask your health care provider or pharmacist if you have questions. COMMON BRAND NAME(S): Neulasta What should I tell my health care provider before I take this medicine? They need to know if you have any of these conditions: -latex allergy -ongoing radiation therapy -sickle cell disease -skin reactions to acrylic adhesives (On-Body Injector only) -an unusual or allergic reaction to pegfilgrastim, filgrastim, other medicines, foods, dyes, or preservatives -pregnant or trying to get pregnant -breast-feeding How should I use this medicine? This medicine is for injection under the skin. If you get this medicine at home, you will be taught how to prepare and give the pre-filled syringe or how to use the On-body Injector. Refer to the patient Instructions for Use for detailed instructions. Use exactly as directed. Take your medicine at regular intervals. Do not take your medicine more often than directed. It is important that you put your used needles and syringes in a special sharps container. Do not put them in a trash can. If you do not have a sharps container, call your pharmacist or healthcare provider to get one. Talk to your pediatrician regarding the use of this medicine in children. Special care may be needed. Overdosage: If you think you have taken too much of this medicine contact a poison control center or emergency room at once. NOTE: This medicine is only for you. Do not share this medicine with others. What if I miss a dose? It is  important not to miss your dose. Call your doctor or health care professional if you miss your dose. If you miss a dose due to an On-body Injector failure or leakage, a new dose should be administered as soon as possible using a single prefilled syringe for manual use. What may interact with this medicine? Interactions have not been studied. Give your health care provider a list of all the medicines, herbs, non-prescription drugs, or dietary supplements you use. Also tell them if you smoke, drink alcohol, or use illegal drugs. Some items may interact with your medicine. This list may not describe all possible interactions. Give your health care provider a list of all the medicines, herbs, non-prescription drugs, or dietary supplements you use. Also tell them if you smoke, drink alcohol, or use illegal drugs. Some items may interact with your medicine. What should I watch for while using this medicine? You may need blood work done while you are taking this medicine. If you are going to need a MRI, CT scan, or other procedure, tell your doctor that you are using this medicine (On-Body Injector only). What side effects may I notice from receiving this medicine? Side effects that you should report to your doctor or health care professional as soon as possible: -allergic reactions like skin rash, itching or hives, swelling of the face, lips, or tongue -dizziness -fever -pain, redness, or irritation at site where injected -pinpoint red spots on the skin -shortness of breath or breathing problems -stomach or side pain, or pain at the shoulder -swelling -tiredness -trouble passing urine Side effects that usually do not require medical attention (report to your doctor   or health care professional if they continue or are bothersome): -bone pain -muscle pain This list may not describe all possible side effects. Call your doctor for medical advice about side effects. You may report side effects to FDA at  1-800-FDA-1088. Where should I keep my medicine? Keep out of the reach of children. Store pre-filled syringes in a refrigerator between 2 and 8 degrees C (36 and 46 degrees F). Do not freeze. Keep in carton to protect from light. Throw away this medicine if it is left out of the refrigerator for more than 48 hours. Throw away any unused medicine after the expiration date. NOTE: This sheet is a summary. It may not cover all possible information. If you have questions about this medicine, talk to your doctor, pharmacist, or health care provider.  2015, [employer] Standard. (2013-04-22 16:14:05) Implanted Port Home Guide An implanted port is a type of central line that is placed under the skin. Central lines are used to provide IV access when treatment or nutrition needs to be given through a person's veins. Implanted ports are used for long-term IV access. An implanted port may be placed because:   You need IV medicine that would be irritating to the small veins in your hands or arms.   You need long-term IV medicines, such as antibiotics.   You need IV nutrition for a long period.   You need frequent blood draws for lab tests.   You need dialysis.  Implanted ports are usually placed in the chest area, but they can also be placed in the upper arm, the abdomen, or the leg. An implanted port has two main parts:   Reservoir. The reservoir is round and will appear as a small, raised area under your skin. The reservoir is the part where a needle is inserted to give medicines or draw blood.   Catheter. The catheter is a thin, flexible tube that extends from the reservoir. The catheter is placed into a large vein. Medicine that is inserted into the reservoir goes into the catheter and then into the vein.  HOW WILL I CARE FOR MY INCISION SITE? Do not get the incision site wet. Bathe or shower as directed by your health care provider.  HOW IS MY PORT ACCESSED? Special steps must be taken to  access the port:   Before the port is accessed, a numbing cream can be placed on the skin. This helps numb the skin over the port site.   Your health care provider uses a sterile technique to access the port.  Your health care provider must put on a mask and sterile gloves.  The skin over your port is cleaned carefully with an antiseptic and allowed to dry.  The port is gently pinched between sterile gloves, and a needle is inserted into the port.  Only "non-coring" port needles should be used to access the port. Once the port is accessed, a blood return should be checked. This helps ensure that the port is in the vein and is not clogged.   If your port needs to remain accessed for a constant infusion, a clear (transparent) bandage will be placed over the needle site. The bandage and needle will need to be changed every week, or as directed by your health care provider.   Keep the bandage covering the needle clean and dry. Do not get it wet. Follow your health care provider's instructions on how to take a shower or bath while the port is accessed.     If your port does not need to stay accessed, no bandage is needed over the port.  WHAT IS FLUSHING? Flushing helps keep the port from getting clogged. Follow your health care provider's instructions on how and when to flush the port. Ports are usually flushed with saline solution or a medicine called heparin. The need for flushing will depend on how the port is used.   If the port is used for intermittent medicines or blood draws, the port will need to be flushed:   After medicines have been given.   After blood has been drawn.   As part of routine maintenance.   If a constant infusion is running, the port may not need to be flushed.  HOW LONG WILL MY PORT STAY IMPLANTED? The port can stay in for as long as your health care provider thinks it is needed. When it is time for the port to come out, surgery will be done to remove it.  The procedure is similar to the one performed when the port was put in.  WHEN SHOULD I SEEK IMMEDIATE MEDICAL CARE? When you have an implanted port, you should seek immediate medical care if:   You notice a bad smell coming from the incision site.   You have swelling, redness, or drainage at the incision site.   You have more swelling or pain at the port site or the surrounding area.   You have a fever that is not controlled with medicine. Document Released: 01/21/2005 Document Revised: 11/11/2012 Document Reviewed: 09/28/2012 Exit[employer] Patient Information 2015 [employer]. This information is not intended to replace advice given to you by your health care provider. Make sure you discuss any questions you have with your health care provider.  

## 2014-01-06 ENCOUNTER — Encounter: Payer: Self-pay | Admitting: *Deleted

## 2014-01-12 ENCOUNTER — Ambulatory Visit (HOSPITAL_COMMUNITY)
Admission: RE | Admit: 2014-01-12 | Discharge: 2014-01-12 | Disposition: A | Discharge status: Home / Self Care | Source: Ambulatory Visit | Attending: Nurse Practitioner | Admitting: Nurse Practitioner

## 2014-01-12 DIAGNOSIS — G8929 Other chronic pain: Secondary | ICD-10-CM | POA: Diagnosis not present

## 2014-01-12 DIAGNOSIS — R109 Unspecified abdominal pain: Secondary | ICD-10-CM | POA: Diagnosis present

## 2014-01-12 DIAGNOSIS — C259 Malignant neoplasm of pancreas, unspecified: Secondary | ICD-10-CM | POA: Diagnosis not present

## 2014-01-12 DIAGNOSIS — C257 Malignant neoplasm of other parts of pancreas: Secondary | ICD-10-CM

## 2014-01-12 MED ORDER — IOHEXOL 300 MG/ML  SOLN
100.0000 mL | Freq: Once | INTRAMUSCULAR | Status: AC | PRN
  Administered 2014-01-12: 100 mL via INTRAVENOUS

## 2014-01-14 ENCOUNTER — Other Ambulatory Visit (HOSPITAL_BASED_OUTPATIENT_CLINIC_OR_DEPARTMENT_OTHER)

## 2014-01-14 ENCOUNTER — Telehealth: Payer: Self-pay | Admitting: *Deleted

## 2014-01-14 ENCOUNTER — Telehealth: Payer: Self-pay | Admitting: Oncology

## 2014-01-14 ENCOUNTER — Ambulatory Visit (HOSPITAL_BASED_OUTPATIENT_CLINIC_OR_DEPARTMENT_OTHER): Admitting: Oncology

## 2014-01-14 VITALS — BP 140/91 | HR 104 | Temp 99.6°F | Resp 18 | Ht 70.0 in | Wt 166.1 lb

## 2014-01-14 DIAGNOSIS — C257 Malignant neoplasm of other parts of pancreas: Secondary | ICD-10-CM

## 2014-01-14 DIAGNOSIS — C252 Malignant neoplasm of tail of pancreas: Secondary | ICD-10-CM

## 2014-01-14 DIAGNOSIS — C251 Malignant neoplasm of body of pancreas: Secondary | ICD-10-CM

## 2014-01-14 DIAGNOSIS — C259 Malignant neoplasm of pancreas, unspecified: Secondary | ICD-10-CM

## 2014-01-14 LAB — COMPREHENSIVE METABOLIC PANEL (CC13)
ALT: 14 U/L (ref 0–55)
AST: 13 U/L (ref 5–34)
Albumin: 3.6 g/dL (ref 3.5–5.0)
Alkaline Phosphatase: 183 U/L — ABNORMAL HIGH (ref 40–150)
Anion Gap: 11 mEq/L (ref 3–11)
BUN: 10.1 mg/dL (ref 7.0–26.0)
CO2: 27 mEq/L (ref 22–29)
CREATININE: 1 mg/dL (ref 0.7–1.3)
Calcium: 8.7 mg/dL (ref 8.4–10.4)
Chloride: 103 mEq/L (ref 98–109)
GLUCOSE: 131 mg/dL (ref 70–140)
Potassium: 4.2 mEq/L (ref 3.5–5.1)
Sodium: 141 mEq/L (ref 136–145)
Total Bilirubin: 0.21 mg/dL (ref 0.20–1.20)
Total Protein: 6.1 g/dL — ABNORMAL LOW (ref 6.4–8.3)

## 2014-01-14 LAB — CBC WITH DIFFERENTIAL/PLATELET
BASO%: 0.3 % (ref 0.0–2.0)
BASOS ABS: 0.1 10*3/uL (ref 0.0–0.1)
EOS%: 0.3 % (ref 0.0–7.0)
Eosinophils Absolute: 0.1 10*3/uL (ref 0.0–0.5)
HCT: 35.6 % — ABNORMAL LOW (ref 38.4–49.9)
HEMOGLOBIN: 11.1 g/dL — AB (ref 13.0–17.1)
LYMPH#: 3.3 10*3/uL (ref 0.9–3.3)
LYMPH%: 17.8 % (ref 14.0–49.0)
MCH: 27.1 pg — AB (ref 27.2–33.4)
MCHC: 31.2 g/dL — AB (ref 32.0–36.0)
MCV: 86.8 fL (ref 79.3–98.0)
MONO#: 1.7 10*3/uL — ABNORMAL HIGH (ref 0.1–0.9)
MONO%: 9 % (ref 0.0–14.0)
NEUT#: 13.4 10*3/uL — ABNORMAL HIGH (ref 1.5–6.5)
NEUT%: 72.6 % (ref 39.0–75.0)
Platelets: 108 10*3/uL — ABNORMAL LOW (ref 140–400)
RBC: 4.1 10*6/uL — ABNORMAL LOW (ref 4.20–5.82)
RDW: 23 % — AB (ref 11.0–14.6)
WBC: 18.5 10*3/uL — AB (ref 4.0–10.3)
nRBC: 1 % — ABNORMAL HIGH (ref 0–0)

## 2014-01-14 NOTE — Progress Notes (Signed)
[place] [facility] OFFICE PROGRESS NOTE   Diagnosis: Pancreas cancer  INTERVAL HISTORY:   He returns as scheduled. He completed a cycle of FOLFIRI on 01/03/2014. No diarrhea following chemotherapy. Stable abdominal pain. Good appetite and energy level. 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 He reports dryness of the skin over the trunk and feet.  Objective:  Vital signs in last 24 hours:  Blood pressure 140/91, pulse 104, temperature 99.6 F (37.6 C), temperature source Oral, resp. rate 18, height 5\' 10"  (1.778 m), weight 166 lb 1.6 oz (75.342 kg).    HEENT: No thrush or ulcers Resp: Lungs clear bilaterally Cardio: Regular rate and rhythm GI: No hepatomegaly, nontender, no mass Vascular: No leg edema  Skin: Hyperpigmentation with dry scaling over the trunk and extremities. Skin thickening and dry desquamation of the soles.   Portacath/PICC-without erythema  Lab Results:  Lab Results  Component Value Date   WBC 18.5* 01/14/2014   HGB 11.1* 01/14/2014   HCT 35.6* 01/14/2014   MCV 86.8 01/14/2014   PLT 108* 01/14/2014   NEUTROABS 13.4* 01/14/2014   01/03/2014-CA 19-9:   2681.9   Imaging:  Ct Abdomen Pelvis W Contrast  01/12/2014   CLINICAL DATA:  Pancreatic cancer diagnosed 07/2013, chemotherapy complete, chronic abdominal pain  EXAM: CT ABDOMEN AND PELVIS WITH CONTRAST  TECHNIQUE: Multidetector CT imaging of the abdomen and pelvis was performed using the standard protocol following bolus administration of intravenous contrast.  CONTRAST:  148mL OMNIPAQUE IOHEXOL 300 MG/ML  SOLN  COMPARISON:  10/22/2013  FINDINGS: Lower chest:  Lung bases are clear.  Hepatobiliary: Scattered probable hepatic cysts, including an 8 mm cyst in the lateral segment left hepatic dome (series 6/image 13) and a 6 mm cyst inferiorly in the anterior segment right hepatic lobe (series 6/ image 41).  However, an 8 mm lesion in the anterior segment right hepatic dome does not  measure simple fluid density (series 6/image 14) and was not present on prior MRI, suspicious for metastasis. This is grossly unchanged.  Additional scattered tiny hepatic lesions are too small to characterize.  Gallbladder is mildly distended but grossly unremarkable. No intrahepatic or extrahepatic ductal dilatation.  Pancreas: Atrophy of the pancreatic body/tail with mild dilatation of the pancreatic duct.  Again noted is abnormal soft tissue along the posterior aspect of the pancreatic body which encases the celiac artery (series 6/ image 31), difficult to discretely measure but grossly unchanged.  Spleen: Within normal limits.  Adrenals/Urinary Tract: Adrenal glands are unremarkable.  Kidneys are within normal limits.  No hydronephrosis.  Bladder is unremarkable.  Stomach/Bowel: Stomach is within normal limits.  No evidence of bowel obstruction.  Normal appendix.  Sigmoid colon is thick-walled but underdistended.  Vascular/Lymphatic: Atherosclerotic calcifications of the abdominal aorta and branch vessels.  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  Reproductive: Prostate is grossly unremarkable  Other: No abdominopelvic ascites.  Small fat containing right inguinal hernia. Fat within the left inguinal canal.  Musculoskeletal: Mild degenerative changes of the visualized thoracolumbar spine.  Status post ORIF of the right proximal femur.  IMPRESSION: Stable abnormal soft tissue posterior to the pancreas and encasing the celiac axis, corresponding to known pancreatic adenocarcinoma.  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   Electronically Signed   By: Julian Hy M.D.   On: 01/12/2014 09:27    Medications: I  have reviewed the patient's current medications.  Assessment/Plan: 1. Pancreas cancer.  07/06/2013 ,CA  19-9- 2067.   CT abdomen/pelvis 07/09/2013 with interval development of amorphous soft tissue circumferentially encasing the celiac axis and tracking caudally down toward the SMA. Associated stable irregular mild distention of the main pancreatic duct in the body and tail of the pancreas with no discrete mass lesion seen. 2.2 x 1.4 cm portacaval lymph node in the hepatoduodenal ligament had increased in size from 1.3 x 1.1 cm previously. Other scattered small lymph nodes seen in the hepatoduodenal ligament.   Endoscopic ultrasound 07/15/2013 with a very vague masslike lesion in the region of the uncinate pancreas. Fine needle aspiration showed atypical cells consistent with adenocarcinoma.   PET scan 07/29/2013 with hypermetabolic soft tissue posterior to the pancreas with an adjacent hypermetabolic lymph node, no hypermetabolic pancreas mass, no evidence of distant metastatic disease   Cycle 1 FOLFIRINOX 08/02/2013.   Cycle 2 FOLFIRINOX 08/16/2013.   Cycle 3 FOLFIRINOX 09/06/2013.   Cycle 4 FOLFIRINOX 09/21/2013.   CA 19-9 improved on 10/04/2013.   Cycle 5 FOLFIRINOX 10/04/2013.   Cycle 6 FOLFIRINOX 10/18/2013   Restaging CT 10/22/2013 with no change in the soft tissue surrounding the pancreas/celiac axis, scattered indeterminate low-attenuation liver lesions are more conspicuous.  Cycle 7 FOLFIRINOX 11/08/2013  Cycle 8 FOLFIRINOX 11/22/2013  Cycle 9 FOLFIRINOX 12/06/2013  Cycle 10 FOLFIRI (oxaliplatin eliminated) 12/20/2013.  Cycle 11 FOLFIRI 01/03/2014  Restaging CT 01/12/2014-no change in the soft tissue posterior to the pancreas or an 8 mm hepatic metastasis 2. Abdominal pain secondary to #1.  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 3. Hypertension. 4. History of gout. 5. Nausea and vomiting following FOLFIRINOX chemotherapy.  Aloxi and Emend added with cycle 2.   Decadron added with cycle 3. 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 7. Hospitalization 08/24/2013 through 08/27/2013 with poorly controlled nausea/vomiting. Diarrhea during the hospitalization. 8. Skin hyperpigmentation and dryness secondary to 5-fluorouracil-he will continue a moisturizer cream  Disposition:  I reviewed the CT findings with [name] and his wife. He appears stable. No clinical or CT evidence of disease progression. The CA 19-9 is lower. The plan is to continue FOLFIRI. He will return for another cycle of chemotherapy 01/17/2014. Mr. Strege is scheduled for an office visit 02/07/2013.  Betsy Coder, MD  01/14/2014  8:49 AM

## 2014-01-14 NOTE — Telephone Encounter (Signed)
Gave avs & cal for Dec/Jan. Sent mess to sch tx °

## 2014-01-14 NOTE — Telephone Encounter (Signed)
Per staff message and POF I have scheduled appts. Advised scheduler of appts. JMW  

## 2014-01-17 ENCOUNTER — Ambulatory Visit (HOSPITAL_BASED_OUTPATIENT_CLINIC_OR_DEPARTMENT_OTHER)

## 2014-01-17 ENCOUNTER — Other Ambulatory Visit: Payer: Self-pay | Admitting: Oncology

## 2014-01-17 ENCOUNTER — Other Ambulatory Visit: Payer: Self-pay | Admitting: *Deleted

## 2014-01-17 DIAGNOSIS — C252 Malignant neoplasm of tail of pancreas: Secondary | ICD-10-CM

## 2014-01-17 DIAGNOSIS — C259 Malignant neoplasm of pancreas, unspecified: Secondary | ICD-10-CM

## 2014-01-17 DIAGNOSIS — Z5111 Encounter for antineoplastic chemotherapy: Secondary | ICD-10-CM

## 2014-01-17 DIAGNOSIS — Z8719 Personal history of other diseases of the digestive system: Secondary | ICD-10-CM

## 2014-01-17 DIAGNOSIS — C189 Malignant neoplasm of colon, unspecified: Secondary | ICD-10-CM

## 2014-01-17 DIAGNOSIS — C251 Malignant neoplasm of body of pancreas: Secondary | ICD-10-CM

## 2014-01-17 MED ORDER — PALONOSETRON HCL INJECTION 0.25 MG/5ML
0.2500 mg | Freq: Once | INTRAVENOUS | Status: AC
  Administered 2014-01-17: 0.25 mg via INTRAVENOUS

## 2014-01-17 MED ORDER — ATROPINE SULFATE 1 MG/ML IJ SOLN
INTRAMUSCULAR | Status: AC
  Filled 2014-01-17: qty 1

## 2014-01-17 MED ORDER — SODIUM CHLORIDE 0.9 % IV SOLN
2400.0000 mg/m2 | INTRAVENOUS | Status: DC
  Administered 2014-01-17: 4750 mg via INTRAVENOUS
  Filled 2014-01-17: qty 95

## 2014-01-17 MED ORDER — PALONOSETRON HCL INJECTION 0.25 MG/5ML
INTRAVENOUS | Status: AC
  Filled 2014-01-17: qty 5

## 2014-01-17 MED ORDER — IRINOTECAN HCL CHEMO INJECTION 100 MG/5ML
140.0000 mg/m2 | Freq: Once | INTRAVENOUS | Status: AC
  Administered 2014-01-17: 276 mg via INTRAVENOUS
  Filled 2014-01-17: qty 13.8

## 2014-01-17 MED ORDER — SODIUM CHLORIDE 0.9 % IV SOLN
150.0000 mg | Freq: Once | INTRAVENOUS | Status: AC
  Administered 2014-01-17: 150 mg via INTRAVENOUS
  Filled 2014-01-17: qty 5

## 2014-01-17 MED ORDER — LEUCOVORIN CALCIUM INJECTION 350 MG
400.0000 mg/m2 | Freq: Once | INTRAVENOUS | Status: AC
  Administered 2014-01-17: 800 mg via INTRAVENOUS
  Filled 2014-01-17: qty 40

## 2014-01-17 MED ORDER — DEXAMETHASONE SODIUM PHOSPHATE 20 MG/5ML IJ SOLN
12.0000 mg | Freq: Once | INTRAMUSCULAR | Status: AC
  Administered 2014-01-17: 12 mg via INTRAVENOUS

## 2014-01-17 MED ORDER — DEXAMETHASONE SODIUM PHOSPHATE 20 MG/5ML IJ SOLN
INTRAMUSCULAR | Status: AC
  Filled 2014-01-17: qty 5

## 2014-01-17 MED ORDER — LIDOCAINE-PRILOCAINE 2.5-2.5 % EX CREA: TOPICAL_CREAM | CUTANEOUS | Status: DC

## 2014-01-17 MED ORDER — DEXTROSE 5 % IV SOLN
Freq: Once | INTRAVENOUS | Status: AC
  Administered 2014-01-17: 09:00:00 via INTRAVENOUS

## 2014-01-17 MED ORDER — ATROPINE SULFATE 1 MG/ML IJ SOLN
0.5000 mg | Freq: Once | INTRAMUSCULAR | Status: AC | PRN
  Administered 2014-01-17: 0.5 mg via INTRAVENOUS

## 2014-01-17 NOTE — Patient Instructions (Signed)
[facility] Discharge Instructions for Patients Receiving Chemotherapy  Today you received the following chemotherapy agents Irinotecan, 5FU and Leucovorin.  To help prevent nausea and vomiting after your treatment, we encourage you to take your nausea medication as prescribed.   If you develop nausea and vomiting that is not controlled by your nausea medication, call the clinic.   BELOW ARE SYMPTOMS THAT SHOULD BE REPORTED IMMEDIATELY:  *FEVER GREATER THAN 100.5 F  *CHILLS WITH OR WITHOUT FEVER  NAUSEA AND VOMITING THAT IS NOT CONTROLLED WITH YOUR NAUSEA MEDICATION  *UNUSUAL SHORTNESS OF BREATH  *UNUSUAL BRUISING OR BLEEDING  TENDERNESS IN MOUTH AND THROAT WITH OR WITHOUT PRESENCE OF ULCERS  *URINARY PROBLEMS  *BOWEL PROBLEMS  UNUSUAL RASH Items with * indicate a potential emergency and should be followed up as soon as possible.  Feel free to call the clinic you have any questions or concerns. The clinic phone number is [phone].

## 2014-01-17 NOTE — Progress Notes (Signed)
Stopped by patient stating,"I've never had cramping before. Did I get the medicine before chemo started?" This RN paused Leucovorin and Camptosar and administered Atropine. Amelia Jo, RN

## 2014-01-19 ENCOUNTER — Ambulatory Visit (HOSPITAL_BASED_OUTPATIENT_CLINIC_OR_DEPARTMENT_OTHER)

## 2014-01-19 ENCOUNTER — Ambulatory Visit

## 2014-01-19 DIAGNOSIS — Z5189 Encounter for other specified aftercare: Secondary | ICD-10-CM

## 2014-01-19 DIAGNOSIS — C252 Malignant neoplasm of tail of pancreas: Secondary | ICD-10-CM

## 2014-01-19 DIAGNOSIS — C251 Malignant neoplasm of body of pancreas: Secondary | ICD-10-CM

## 2014-01-19 DIAGNOSIS — Z8719 Personal history of other diseases of the digestive system: Secondary | ICD-10-CM

## 2014-01-19 DIAGNOSIS — C259 Malignant neoplasm of pancreas, unspecified: Secondary | ICD-10-CM

## 2014-01-19 MED ORDER — HEPARIN SOD (PORK) LOCK FLUSH 100 UNIT/ML IV SOLN
500.0000 [IU] | Freq: Once | INTRAVENOUS | Status: AC | PRN
  Administered 2014-01-19: 500 [IU]
  Filled 2014-01-19: qty 5

## 2014-01-19 MED ORDER — SODIUM CHLORIDE 0.9 % IJ SOLN
10.0000 mL | INTRAMUSCULAR | Status: DC | PRN
  Administered 2014-01-19: 10 mL
  Filled 2014-01-19: qty 10

## 2014-01-19 MED ORDER — PEGFILGRASTIM INJECTION 6 MG/0.6ML
6.0000 mg | Freq: Once | SUBCUTANEOUS | Status: AC
  Administered 2014-01-19: 6 mg via SUBCUTANEOUS
  Filled 2014-01-19: qty 0.6

## 2014-01-31 ENCOUNTER — Other Ambulatory Visit: Payer: Self-pay | Admitting: Oncology

## 2014-02-02 ENCOUNTER — Other Ambulatory Visit: Payer: Self-pay | Admitting: Family Medicine

## 2014-02-07 ENCOUNTER — Ambulatory Visit (HOSPITAL_BASED_OUTPATIENT_CLINIC_OR_DEPARTMENT_OTHER): Admitting: Nurse Practitioner

## 2014-02-07 ENCOUNTER — Other Ambulatory Visit (HOSPITAL_BASED_OUTPATIENT_CLINIC_OR_DEPARTMENT_OTHER)

## 2014-02-07 ENCOUNTER — Ambulatory Visit (HOSPITAL_BASED_OUTPATIENT_CLINIC_OR_DEPARTMENT_OTHER)

## 2014-02-07 ENCOUNTER — Telehealth: Payer: Self-pay | Admitting: Oncology

## 2014-02-07 ENCOUNTER — Other Ambulatory Visit: Payer: Self-pay | Admitting: Oncology

## 2014-02-07 VITALS — BP 130/85 | HR 90 | Temp 98.7°F | Resp 18 | Ht 70.0 in | Wt 168.7 lb

## 2014-02-07 DIAGNOSIS — C252 Malignant neoplasm of tail of pancreas: Secondary | ICD-10-CM

## 2014-02-07 DIAGNOSIS — Z8719 Personal history of other diseases of the digestive system: Secondary | ICD-10-CM

## 2014-02-07 DIAGNOSIS — I82890 Acute embolism and thrombosis of other specified veins: Secondary | ICD-10-CM

## 2014-02-07 DIAGNOSIS — C257 Malignant neoplasm of other parts of pancreas: Secondary | ICD-10-CM

## 2014-02-07 DIAGNOSIS — Z5111 Encounter for antineoplastic chemotherapy: Secondary | ICD-10-CM

## 2014-02-07 DIAGNOSIS — C787 Secondary malignant neoplasm of liver and intrahepatic bile duct: Secondary | ICD-10-CM

## 2014-02-07 DIAGNOSIS — C251 Malignant neoplasm of body of pancreas: Secondary | ICD-10-CM

## 2014-02-07 DIAGNOSIS — C259 Malignant neoplasm of pancreas, unspecified: Secondary | ICD-10-CM

## 2014-02-07 LAB — COMPREHENSIVE METABOLIC PANEL (CC13)
ALK PHOS: 117 U/L (ref 40–150)
ALT: 10 U/L (ref 0–55)
AST: 13 U/L (ref 5–34)
Albumin: 3.9 g/dL (ref 3.5–5.0)
Anion Gap: 9 mEq/L (ref 3–11)
BUN: 11.5 mg/dL (ref 7.0–26.0)
CO2: 30 mEq/L — ABNORMAL HIGH (ref 22–29)
CREATININE: 0.9 mg/dL (ref 0.7–1.3)
Calcium: 9.1 mg/dL (ref 8.4–10.4)
Chloride: 104 mEq/L (ref 98–109)
EGFR: >90 mL/min/{1.73_m2} (ref >90)
Glucose: 97 mg/dl (ref 70–140)
Potassium: 4.3 mEq/L (ref 3.5–5.1)
Sodium: 143 mEq/L (ref 136–145)
Total Bilirubin: 0.37 mg/dL (ref 0.20–1.20)
Total Protein: 6.5 g/dL (ref 6.4–8.3)

## 2014-02-07 LAB — CBC WITH DIFFERENTIAL/PLATELET
BASO%: 1.1 % (ref 0.0–2.0)
BASOS ABS: 0.1 10*3/uL (ref 0.0–0.1)
EOS%: 0.2 % (ref 0.0–7.0)
Eosinophils Absolute: 0 10*3/uL (ref 0.0–0.5)
HCT: 37.2 % — ABNORMAL LOW (ref 38.4–49.9)
HEMOGLOBIN: 11.4 g/dL — AB (ref 13.0–17.1)
LYMPH#: 2.3 10*3/uL (ref 0.9–3.3)
LYMPH%: 19.9 % (ref 14.0–49.0)
MCH: 26.8 pg — AB (ref 27.2–33.4)
MCHC: 30.8 g/dL — ABNORMAL LOW (ref 32.0–36.0)
MCV: 87 fL (ref 79.3–98.0)
MONO#: 0.7 10*3/uL (ref 0.1–0.9)
MONO%: 6.2 % (ref 0.0–14.0)
NEUT#: 8.5 10*3/uL — ABNORMAL HIGH (ref 1.5–6.5)
NEUT%: 72.6 % (ref 39.0–75.0)
Platelets: 185 10*3/uL (ref 140–400)
RBC: 4.27 10*6/uL (ref 4.20–5.82)
RDW: 23.7 % — AB (ref 11.0–14.6)
WBC: 11.7 10*3/uL — ABNORMAL HIGH (ref 4.0–10.3)

## 2014-02-07 LAB — CANCER ANTIGEN 19-9: CA 19-9: 1548.4 U/mL — ABNORMAL HIGH (ref <35.0)

## 2014-02-07 MED ORDER — ATROPINE SULFATE 1 MG/ML IJ SOLN
0.5000 mg | Freq: Once | INTRAMUSCULAR | Status: AC | PRN
  Administered 2014-02-07: 0.5 mg via INTRAVENOUS

## 2014-02-07 MED ORDER — DEXAMETHASONE SODIUM PHOSPHATE 20 MG/5ML IJ SOLN
INTRAMUSCULAR | Status: AC
  Filled 2014-02-07: qty 5

## 2014-02-07 MED ORDER — SODIUM CHLORIDE 0.9 % IV SOLN
150.0000 mg | Freq: Once | INTRAVENOUS | Status: AC
  Administered 2014-02-07: 150 mg via INTRAVENOUS
  Filled 2014-02-07: qty 5

## 2014-02-07 MED ORDER — MORPHINE SULFATE ER 30 MG PO TBCR: 30.0000 mg | EXTENDED_RELEASE_TABLET | Freq: Two times a day (BID) | ORAL | Status: DC

## 2014-02-07 MED ORDER — PALONOSETRON HCL INJECTION 0.25 MG/5ML
0.2500 mg | Freq: Once | INTRAVENOUS | Status: AC
  Administered 2014-02-07: 0.25 mg via INTRAVENOUS

## 2014-02-07 MED ORDER — DEXTROSE 5 % IV SOLN: Freq: Once | INTRAVENOUS | Status: DC

## 2014-02-07 MED ORDER — ATROPINE SULFATE 1 MG/ML IJ SOLN
INTRAMUSCULAR | Status: AC
  Filled 2014-02-07: qty 1

## 2014-02-07 MED ORDER — IRINOTECAN HCL CHEMO INJECTION 100 MG/5ML
140.0000 mg/m2 | Freq: Once | INTRAVENOUS | Status: AC
  Administered 2014-02-07: 276 mg via INTRAVENOUS
  Filled 2014-02-07: qty 13.8

## 2014-02-07 MED ORDER — SODIUM CHLORIDE 0.9 % IV SOLN
2400.0000 mg/m2 | INTRAVENOUS | Status: DC
  Administered 2014-02-07: 4750 mg via INTRAVENOUS
  Filled 2014-02-07: qty 95

## 2014-02-07 MED ORDER — LEUCOVORIN CALCIUM INJECTION 350 MG
400.0000 mg/m2 | Freq: Once | INTRAMUSCULAR | Status: AC
  Administered 2014-02-07: 800 mg via INTRAVENOUS
  Filled 2014-02-07: qty 40

## 2014-02-07 MED ORDER — PALONOSETRON HCL INJECTION 0.25 MG/5ML
INTRAVENOUS | Status: AC
  Filled 2014-02-07: qty 5

## 2014-02-07 MED ORDER — OXYCODONE-ACETAMINOPHEN 10-325 MG PO TABS: ORAL_TABLET | ORAL | Status: DC

## 2014-02-07 MED ORDER — DEXAMETHASONE SODIUM PHOSPHATE 20 MG/5ML IJ SOLN
12.0000 mg | Freq: Once | INTRAMUSCULAR | Status: AC
  Administered 2014-02-07: 12 mg via INTRAVENOUS

## 2014-02-07 NOTE — Patient Instructions (Signed)
[facility] Discharge Instructions for Patients Receiving Chemotherapy  Today you received the following chemotherapy agents Folfiri  To help prevent nausea and vomiting after your treatment, we encourage you to take your nausea medication as directed/prescribed.   If you develop nausea and vomiting that is not controlled by your nausea medication, call the clinic.   BELOW ARE SYMPTOMS THAT SHOULD BE REPORTED IMMEDIATELY:  *FEVER GREATER THAN 100.5 F  *CHILLS WITH OR WITHOUT FEVER  NAUSEA AND VOMITING THAT IS NOT CONTROLLED WITH YOUR NAUSEA MEDICATION  *UNUSUAL SHORTNESS OF BREATH  *UNUSUAL BRUISING OR BLEEDING  TENDERNESS IN MOUTH AND THROAT WITH OR WITHOUT PRESENCE OF ULCERS  *URINARY PROBLEMS  *BOWEL PROBLEMS  UNUSUAL RASH Items with * indicate a potential emergency and should be followed up as soon as possible.  Feel free to call the clinic you have any questions or concerns. The clinic phone number is [phone].

## 2014-02-07 NOTE — Progress Notes (Signed)
  [facility] OFFICE PROGRESS NOTE   Diagnosis:  Pancreas cancer  INTERVAL HISTORY:   Ray [name] returns as scheduled. He completed cycle 12 FOLFIRI on 01/17/2014. He has mild intermittent nausea which he feels is unrelated to chemotherapy. The nausea is controlled effectively with Compazine. No mouth sores. No diarrhea. Abdominal pain is well-controlled with MS Contin and Percocet. He reports a good appetite.  Objective:  Vital signs in last 24 hours:  Blood pressure 130/85, pulse 90, temperature 98.7 F (37.1 C), temperature source Oral, resp. rate 18, height 5\' 10"  (1.778 m), weight 168 lb 11.2 oz (76.522 kg), SpO2 98 %.    HEENT: No thrush or ulcers. Resp: Lungs clear bilaterally. Cardio: Regular rate and rhythm. GI: Abdomen soft and nontender. No hepatomegaly. No mass. Vascular: No leg edema. Port-A-Cath without erythema.  Lab Results:  Lab Results  Component Value Date   WBC 11.7* 02/07/2014   HGB 11.4* 02/07/2014   HCT 37.2* 02/07/2014   MCV 87.0 02/07/2014   PLT 185 02/07/2014   NEUTROABS 8.5* 02/07/2014    Imaging:  No results found.  Medications: I have reviewed the patient's current medications.  Assessment/Plan: 1. Pancreas cancer.  07/06/2013 ,CA 19-9- 2067.   CT abdomen/pelvis 07/09/2013 with interval development of amorphous soft tissue circumferentially encasing the celiac axis and tracking caudally down toward the SMA. Associated stable irregular mild distention of the main pancreatic duct in the body and tail of the pancreas with no discrete mass lesion seen. 2.2 x 1.4 cm portacaval lymph node in the hepatoduodenal ligament had increased in size from 1.3 x 1.1 cm previously. Other scattered small lymph nodes seen in the hepatoduodenal ligament.   Endoscopic ultrasound 07/15/2013 with a very vague masslike lesion in the region of the uncinate pancreas. Fine needle aspiration showed atypical cells consistent with adenocarcinoma.    PET scan 07/29/2013 with hypermetabolic soft tissue posterior to the pancreas with an adjacent hypermetabolic lymph node, no hypermetabolic pancreas mass, no evidence of distant metastatic disease   Cycle 1 FOLFIRINOX 08/02/2013.   Cycle 2 FOLFIRINOX 08/16/2013.   Cycle 3 FOLFIRINOX 09/06/2013.   Cycle 4 FOLFIRINOX 09/21/2013.   CA 19-9 improved on 10/04/2013.   Cycle 5 FOLFIRINOX 10/04/2013.   Cycle 6 FOLFIRINOX 10/18/2013   Restaging CT 10/22/2013 with no change in the soft tissue surrounding the pancreas/celiac axis, scattered indeterminate low-attenuation liver lesions are more conspicuous.  Cycle 7 FOLFIRINOX 11/08/2013  Cycle 8 FOLFIRINOX 11/22/2013  Cycle 9 FOLFIRINOX 12/06/2013  Cycle 10 FOLFIRI (oxaliplatin eliminated) 12/20/2013.  Cycle 11 FOLFIRI 01/03/2014  Restaging CT 01/12/2014-no change in the soft tissue posterior to the pancreas or an 8 mm hepatic metastasis  Cycle 12 FOLFIRI 01/17/2014. 2. Abdominal pain secondary to #1.  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 3. Hypertension. 4. History of gout. 5. Nausea and vomiting following FOLFIRINOX chemotherapy.  Aloxi and Emend added with cycle 2.   Decadron added with cycle 3. 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 7. Hospitalization 08/24/2013 through 08/27/2013 with poorly controlled nausea/vomiting. Diarrhea during the hospitalization. 8. Skin hyperpigmentation and dryness secondary to 5-fluorouracil-he will continue a moisturizer cream   Disposition: Ray [name] appears well. Plan to continue every 2 week FOLFIRI. He will return for a follow-up visit in 2 weeks. He will contact the office in the interim with any problems.    Ned Card ANP/GNP-BC   02/07/2014  9:44 AM

## 2014-02-07 NOTE — Telephone Encounter (Signed)
gv adn printed appt sched and avs for pt for Jan and Feb...2016...sed added tx. °

## 2014-02-08 ENCOUNTER — Other Ambulatory Visit: Payer: Self-pay | Admitting: Family Medicine

## 2014-02-08 DIAGNOSIS — L602 Onychogryphosis: Secondary | ICD-10-CM

## 2014-02-09 ENCOUNTER — Ambulatory Visit (HOSPITAL_BASED_OUTPATIENT_CLINIC_OR_DEPARTMENT_OTHER)

## 2014-02-09 ENCOUNTER — Ambulatory Visit

## 2014-02-09 DIAGNOSIS — Z8719 Personal history of other diseases of the digestive system: Secondary | ICD-10-CM

## 2014-02-09 DIAGNOSIS — C259 Malignant neoplasm of pancreas, unspecified: Secondary | ICD-10-CM

## 2014-02-09 DIAGNOSIS — C251 Malignant neoplasm of body of pancreas: Secondary | ICD-10-CM

## 2014-02-09 DIAGNOSIS — Z452 Encounter for adjustment and management of vascular access device: Secondary | ICD-10-CM

## 2014-02-09 DIAGNOSIS — Z5189 Encounter for other specified aftercare: Secondary | ICD-10-CM

## 2014-02-09 MED ORDER — HEPARIN SOD (PORK) LOCK FLUSH 100 UNIT/ML IV SOLN
500.0000 [IU] | Freq: Once | INTRAVENOUS | Status: AC | PRN
  Administered 2014-02-09: 500 [IU]
  Filled 2014-02-09: qty 5

## 2014-02-09 MED ORDER — PEGFILGRASTIM INJECTION 6 MG/0.6ML
6.0000 mg | Freq: Once | SUBCUTANEOUS | Status: AC
  Administered 2014-02-09: 6 mg via SUBCUTANEOUS
  Filled 2014-02-09: qty 0.6

## 2014-02-09 MED ORDER — SODIUM CHLORIDE 0.9 % IJ SOLN
10.0000 mL | INTRAMUSCULAR | Status: DC | PRN
  Administered 2014-02-09: 10 mL
  Filled 2014-02-09: qty 10

## 2014-02-09 NOTE — Progress Notes (Signed)
Neulasta given by flush nurse  

## 2014-02-09 NOTE — Patient Instructions (Signed)
Pegfilgrastim injection What is this medicine? PEGFILGRASTIM (peg fil GRA stim) is a long-acting granulocyte colony-stimulating factor that stimulates the growth of neutrophils, a type of white blood cell important in the body's fight against infection. It is used to reduce the incidence of fever and infection in patients with certain types of cancer who are receiving chemotherapy that affects the bone marrow. This medicine may be used for other purposes; ask your health care provider or pharmacist if you have questions. COMMON BRAND NAME(S): Neulasta What should I tell my health care provider before I take this medicine? They need to know if you have any of these conditions: -latex allergy -ongoing radiation therapy -sickle cell disease -skin reactions to acrylic adhesives (On-Body Injector only) -an unusual or allergic reaction to pegfilgrastim, filgrastim, other medicines, foods, dyes, or preservatives -pregnant or trying to get pregnant -breast-feeding How should I use this medicine? This medicine is for injection under the skin. If you get this medicine at home, you will be taught how to prepare and give the pre-filled syringe or how to use the On-body Injector. Refer to the patient Instructions for Use for detailed instructions. Use exactly as directed. Take your medicine at regular intervals. Do not take your medicine more often than directed. It is important that you put your used needles and syringes in a special sharps container. Do not put them in a trash can. If you do not have a sharps container, call your pharmacist or healthcare provider to get one. Talk to your pediatrician regarding the use of this medicine in children. Special care may be needed. Overdosage: If you think you have taken too much of this medicine contact a poison control center or emergency room at once. NOTE: This medicine is only for you. Do not share this medicine with others. What if I miss a dose? It is  important not to miss your dose. Call your doctor or health care professional if you miss your dose. If you miss a dose due to an On-body Injector failure or leakage, a new dose should be administered as soon as possible using a single prefilled syringe for manual use. What may interact with this medicine? Interactions have not been studied. Give your health care provider a list of all the medicines, herbs, non-prescription drugs, or dietary supplements you use. Also tell them if you smoke, drink alcohol, or use illegal drugs. Some items may interact with your medicine. This list may not describe all possible interactions. Give your health care provider a list of all the medicines, herbs, non-prescription drugs, or dietary supplements you use. Also tell them if you smoke, drink alcohol, or use illegal drugs. Some items may interact with your medicine. What should I watch for while using this medicine? You may need blood work done while you are taking this medicine. If you are going to need a MRI, CT scan, or other procedure, tell your doctor that you are using this medicine (On-Body Injector only). What side effects may I notice from receiving this medicine? Side effects that you should report to your doctor or health care professional as soon as possible: -allergic reactions like skin rash, itching or hives, swelling of the face, lips, or tongue -dizziness -fever -pain, redness, or irritation at site where injected -pinpoint red spots on the skin -shortness of breath or breathing problems -stomach or side pain, or pain at the shoulder -swelling -tiredness -trouble passing urine Side effects that usually do not require medical attention (report to your doctor   or health care professional if they continue or are bothersome): -bone pain -muscle pain This list may not describe all possible side effects. Call your doctor for medical advice about side effects. You may report side effects to FDA at  1-800-FDA-1088. Where should I keep my medicine? Keep out of the reach of children. Store pre-filled syringes in a refrigerator between 2 and 8 degrees C (36 and 46 degrees F). Do not freeze. Keep in carton to protect from light. Throw away this medicine if it is left out of the refrigerator for more than 48 hours. Throw away any unused medicine after the expiration date. NOTE: This sheet is a summary. It may not cover all possible information. If you have questions about this medicine, talk to your doctor, pharmacist, or health care provider.  2015, [employer]/Gold Standard. (2013-04-22 16:14:05)  

## 2014-02-15 ENCOUNTER — Telehealth: Payer: Self-pay | Admitting: *Deleted

## 2014-02-15 NOTE — Telephone Encounter (Signed)
Received message from pt asking "I've got some gas issues; okay to take Pepto Bismol?"  Per [name]; notified pt that Okay with MD to take Pepto Bismol for gas; call office if not resolving.  Pt verbalized understanding and confirmed appt for 02/21/14.

## 2014-02-20 ENCOUNTER — Other Ambulatory Visit: Payer: Self-pay | Admitting: Oncology

## 2014-02-21 ENCOUNTER — Telehealth: Payer: Self-pay | Admitting: Oncology

## 2014-02-21 ENCOUNTER — Telehealth: Payer: Self-pay | Admitting: *Deleted

## 2014-02-21 ENCOUNTER — Ambulatory Visit (HOSPITAL_BASED_OUTPATIENT_CLINIC_OR_DEPARTMENT_OTHER): Admitting: Oncology

## 2014-02-21 ENCOUNTER — Other Ambulatory Visit (HOSPITAL_BASED_OUTPATIENT_CLINIC_OR_DEPARTMENT_OTHER)

## 2014-02-21 ENCOUNTER — Ambulatory Visit (HOSPITAL_BASED_OUTPATIENT_CLINIC_OR_DEPARTMENT_OTHER)

## 2014-02-21 VITALS — BP 140/84 | HR 100 | Temp 99.3°F | Resp 18 | Ht 70.0 in | Wt 166.5 lb

## 2014-02-21 DIAGNOSIS — Z8719 Personal history of other diseases of the digestive system: Secondary | ICD-10-CM

## 2014-02-21 DIAGNOSIS — C259 Malignant neoplasm of pancreas, unspecified: Secondary | ICD-10-CM

## 2014-02-21 DIAGNOSIS — C251 Malignant neoplasm of body of pancreas: Secondary | ICD-10-CM

## 2014-02-21 DIAGNOSIS — C257 Malignant neoplasm of other parts of pancreas: Secondary | ICD-10-CM

## 2014-02-21 DIAGNOSIS — C252 Malignant neoplasm of tail of pancreas: Secondary | ICD-10-CM

## 2014-02-21 DIAGNOSIS — Z5111 Encounter for antineoplastic chemotherapy: Secondary | ICD-10-CM

## 2014-02-21 DIAGNOSIS — G893 Neoplasm related pain (acute) (chronic): Secondary | ICD-10-CM

## 2014-02-21 LAB — COMPREHENSIVE METABOLIC PANEL (CC13)
ALBUMIN: 3.5 g/dL (ref 3.5–5.0)
ALT: 10 U/L (ref 0–55)
AST: 10 U/L (ref 5–34)
Alkaline Phosphatase: 152 U/L — ABNORMAL HIGH (ref 40–150)
Anion Gap: 9 mEq/L (ref 3–11)
BILIRUBIN TOTAL: 0.23 mg/dL (ref 0.20–1.20)
BUN: 8.4 mg/dL (ref 7.0–26.0)
CHLORIDE: 105 meq/L (ref 98–109)
CO2: 25 mEq/L (ref 22–29)
Calcium: 8.6 mg/dL (ref 8.4–10.4)
Creatinine: 0.9 mg/dL (ref 0.7–1.3)
EGFR: >90 mL/min/{1.73_m2} (ref >90)
Glucose: 127 mg/dl (ref 70–140)
POTASSIUM: 4.2 meq/L (ref 3.5–5.1)
Sodium: 139 mEq/L (ref 136–145)
Total Protein: 6.1 g/dL — ABNORMAL LOW (ref 6.4–8.3)

## 2014-02-21 LAB — CBC WITH DIFFERENTIAL/PLATELET
BASO%: 0.7 % (ref 0.0–2.0)
Basophils Absolute: 0.1 10*3/uL (ref 0.0–0.1)
EOS ABS: 0 10*3/uL (ref 0.0–0.5)
EOS%: 0.2 % (ref 0.0–7.0)
HCT: 36.8 % — ABNORMAL LOW (ref 38.4–49.9)
HEMOGLOBIN: 11.4 g/dL — AB (ref 13.0–17.1)
LYMPH%: 18 % (ref 14.0–49.0)
MCH: 27 pg — ABNORMAL LOW (ref 27.2–33.4)
MCHC: 31.1 g/dL — ABNORMAL LOW (ref 32.0–36.0)
MCV: 86.9 fL (ref 79.3–98.0)
MONO#: 0.8 10*3/uL (ref 0.1–0.9)
MONO%: 4.7 % (ref 0.0–14.0)
NEUT#: 13.7 10*3/uL — ABNORMAL HIGH (ref 1.5–6.5)
NEUT%: 76.4 % — AB (ref 39.0–75.0)
Platelets: 184 10*3/uL (ref 140–400)
RBC: 4.23 10*6/uL (ref 4.20–5.82)
RDW: 22.8 % — ABNORMAL HIGH (ref 11.0–14.6)
WBC: 17.9 10*3/uL — ABNORMAL HIGH (ref 4.0–10.3)
lymph#: 3.2 10*3/uL (ref 0.9–3.3)

## 2014-02-21 MED ORDER — SODIUM CHLORIDE 0.9 % IJ SOLN
10.0000 mL | INTRAMUSCULAR | Status: DC | PRN
  Filled 2014-02-21: qty 10

## 2014-02-21 MED ORDER — HEPARIN SOD (PORK) LOCK FLUSH 100 UNIT/ML IV SOLN
500.0000 [IU] | Freq: Once | INTRAVENOUS | Status: DC | PRN
  Filled 2014-02-21: qty 5

## 2014-02-21 MED ORDER — DEXAMETHASONE SODIUM PHOSPHATE 20 MG/5ML IJ SOLN
INTRAMUSCULAR | Status: AC
  Filled 2014-02-21: qty 5

## 2014-02-21 MED ORDER — PALONOSETRON HCL INJECTION 0.25 MG/5ML
0.2500 mg | Freq: Once | INTRAVENOUS | Status: AC
  Administered 2014-02-21: 0.25 mg via INTRAVENOUS

## 2014-02-21 MED ORDER — ATROPINE SULFATE 1 MG/ML IJ SOLN
INTRAMUSCULAR | Status: AC
  Filled 2014-02-21: qty 1

## 2014-02-21 MED ORDER — DEXTROSE 5 % IV SOLN: Freq: Once | INTRAVENOUS | Status: DC

## 2014-02-21 MED ORDER — SODIUM CHLORIDE 0.9 % IV SOLN
2400.0000 mg/m2 | INTRAVENOUS | Status: DC
  Administered 2014-02-21: 4750 mg via INTRAVENOUS
  Filled 2014-02-21: qty 95

## 2014-02-21 MED ORDER — SODIUM CHLORIDE 0.9 % IV SOLN
150.0000 mg | Freq: Once | INTRAVENOUS | Status: AC
  Administered 2014-02-21: 150 mg via INTRAVENOUS
  Filled 2014-02-21: qty 5

## 2014-02-21 MED ORDER — SODIUM CHLORIDE 0.9 % IV SOLN
Freq: Once | INTRAVENOUS | Status: AC
  Administered 2014-02-21: 11:00:00 via INTRAVENOUS

## 2014-02-21 MED ORDER — DEXTROSE 5 % IV SOLN
400.0000 mg/m2 | Freq: Once | INTRAVENOUS | Status: AC
  Administered 2014-02-21: 800 mg via INTRAVENOUS
  Filled 2014-02-21: qty 40

## 2014-02-21 MED ORDER — ATROPINE SULFATE 1 MG/ML IJ SOLN
0.5000 mg | Freq: Once | INTRAMUSCULAR | Status: AC | PRN
  Administered 2014-02-21: 0.5 mg via INTRAVENOUS

## 2014-02-21 MED ORDER — PALONOSETRON HCL INJECTION 0.25 MG/5ML
INTRAVENOUS | Status: AC
  Filled 2014-02-21: qty 5

## 2014-02-21 MED ORDER — DEXAMETHASONE SODIUM PHOSPHATE 20 MG/5ML IJ SOLN
12.0000 mg | Freq: Once | INTRAMUSCULAR | Status: AC
  Administered 2014-02-21: 12 mg via INTRAVENOUS

## 2014-02-21 MED ORDER — DEXTROSE 5 % IV SOLN
140.0000 mg/m2 | Freq: Once | INTRAVENOUS | Status: AC
  Administered 2014-02-21: 276 mg via INTRAVENOUS
  Filled 2014-02-21: qty 13.8

## 2014-02-21 NOTE — Patient Instructions (Signed)
[facility] Discharge Instructions for Patients Receiving Chemotherapy  Today you received the following chemotherapy agents: Irinotecan, Leucovorin, and Adrucil.  To help prevent nausea and vomiting after your treatment, we encourage you to take your nausea medication as prescribed.   If you develop nausea and vomiting that is not controlled by your nausea medication, call the clinic.   BELOW ARE SYMPTOMS THAT SHOULD BE REPORTED IMMEDIATELY:  *FEVER GREATER THAN 100.5 F  *CHILLS WITH OR WITHOUT FEVER  NAUSEA AND VOMITING THAT IS NOT CONTROLLED WITH YOUR NAUSEA MEDICATION  *UNUSUAL SHORTNESS OF BREATH  *UNUSUAL BRUISING OR BLEEDING  TENDERNESS IN MOUTH AND THROAT WITH OR WITHOUT PRESENCE OF ULCERS  *URINARY PROBLEMS  *BOWEL PROBLEMS  UNUSUAL RASH Items with * indicate a potential emergency and should be followed up as soon as possible.  Feel free to call the clinic you have any questions or concerns. The clinic phone number is [phone].

## 2014-02-21 NOTE — Telephone Encounter (Signed)
Gave avs & cal for Jan/Feb. Sent mess to sch tx. °

## 2014-02-21 NOTE — Telephone Encounter (Signed)
Per staff message and POF I have scheduled appts. Advised scheduler of appts. JMW  

## 2014-02-21 NOTE — Progress Notes (Signed)
  [place] OFFICE PROGRESS NOTE   Diagnosis: Pancreas cancer  INTERVAL HISTORY:   Ray [name] completed another cycle FOLFIRI 02/07/2014. He reports tolerating the chemotherapy well. Stable abdominal pain. Good appetite. No new complaint.  Objective:  Vital signs in last 24 hours:  Blood pressure 140/84, pulse 100, temperature 99.3 F (37.4 C), temperature source Oral, resp. rate 18, height 5\' 10"  (1.778 m), weight 166 lb 8 oz (75.524 kg), SpO2 100 %.    HEENT: No thrush or ulcers Resp: Lungs clear bilaterally Cardio: Regular rate and rhythm GI: No hepatomegaly, nontender, no mass Vascular: No leg edema  Skin: Hyperpigmentation of the hands   Portacath/PICC-without erythema  Lab Results:  Lab Results  Component Value Date   WBC 17.9* 02/21/2014   HGB 11.4* 02/21/2014   HCT 36.8* 02/21/2014   MCV 86.9 02/21/2014   PLT 184 02/21/2014   NEUTROABS 13.7* 02/21/2014    CA 19-9 on 02/07/2014-1548 Medications: I have reviewed the patient's current medications.  Assessment/Plan: 1. Pancreas cancer.  07/06/2013 ,CA 19-9- 2067.   CT abdomen/pelvis 07/09/2013 with interval development of amorphous soft tissue circumferentially encasing the celiac axis and tracking caudally down toward the SMA. Associated stable irregular mild distention of the main pancreatic duct in the body and tail of the pancreas with no discrete mass lesion seen. 2.2 x 1.4 cm portacaval lymph node in the hepatoduodenal ligament had increased in size from 1.3 x 1.1 cm previously. Other scattered small lymph nodes seen in the hepatoduodenal ligament.   Endoscopic ultrasound 07/15/2013 with a very vague masslike lesion in the region of the uncinate pancreas. Fine needle aspiration showed atypical cells consistent with adenocarcinoma.   PET scan 07/29/2013 with hypermetabolic soft tissue posterior to the pancreas with an adjacent hypermetabolic lymph node, no hypermetabolic pancreas mass, no  evidence of distant metastatic disease   Cycle 1 FOLFIRINOX 08/02/2013.   Cycle 2 FOLFIRINOX 08/16/2013.   Cycle 3 FOLFIRINOX 09/06/2013.   Cycle 4 FOLFIRINOX 09/21/2013.   CA 19-9 improved on 10/04/2013.   Cycle 5 FOLFIRINOX 10/04/2013.   Cycle 6 FOLFIRINOX 10/18/2013   Restaging CT 10/22/2013 with no change in the soft tissue surrounding the pancreas/celiac axis, scattered indeterminate low-attenuation liver lesions are more conspicuous.  Cycle 7 FOLFIRINOX 11/08/2013  Cycle 8 FOLFIRINOX 11/22/2013  Cycle 9 FOLFIRINOX 12/06/2013  Cycle 10 FOLFIRI (oxaliplatin eliminated) 12/20/2013.  Cycle 11 FOLFIRI 01/03/2014  Restaging CT 01/12/2014-no change in the soft tissue posterior to the pancreas or an 8 mm hepatic metastasis  Cycle 12 FOLFIRI 01/17/2014.  Cycle 13 FOLFIRI 02/07/2014 2. Abdominal pain secondary to #1.  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 3. Hypertension. 4. History of gout. 5. Nausea and vomiting following FOLFIRINOX chemotherapy.  Aloxi and Emend added with cycle 2.   Decadron added with cycle 3. 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 7. Hospitalization 08/24/2013 through 08/27/2013 with poorly controlled nausea/vomiting. Diarrhea during the hospitalization. 8. Skin hyperpigmentation and dryness secondary to 5-fluorouracil-he will continue a moisturizer cream    Disposition:  He appears stable. The CA 19-9 continues to decline. The plan is to continue every 2 week FOLFIRI. He will return for an office visit and chemotherapy in 2 weeks.  Betsy Coder, MD  02/21/2014  8:58 PM

## 2014-02-23 ENCOUNTER — Ambulatory Visit (HOSPITAL_BASED_OUTPATIENT_CLINIC_OR_DEPARTMENT_OTHER)

## 2014-02-23 ENCOUNTER — Ambulatory Visit

## 2014-02-23 DIAGNOSIS — Z5189 Encounter for other specified aftercare: Secondary | ICD-10-CM

## 2014-02-23 DIAGNOSIS — C252 Malignant neoplasm of tail of pancreas: Secondary | ICD-10-CM

## 2014-02-23 DIAGNOSIS — C251 Malignant neoplasm of body of pancreas: Secondary | ICD-10-CM

## 2014-02-23 DIAGNOSIS — Z8719 Personal history of other diseases of the digestive system: Secondary | ICD-10-CM

## 2014-02-23 DIAGNOSIS — C259 Malignant neoplasm of pancreas, unspecified: Secondary | ICD-10-CM

## 2014-02-23 MED ORDER — SODIUM CHLORIDE 0.9 % IJ SOLN
10.0000 mL | INTRAMUSCULAR | Status: DC | PRN
  Administered 2014-02-23: 10 mL
  Filled 2014-02-23: qty 10

## 2014-02-23 MED ORDER — PEGFILGRASTIM INJECTION 6 MG/0.6ML
6.0000 mg | Freq: Once | SUBCUTANEOUS | Status: AC
  Administered 2014-02-23: 6 mg via SUBCUTANEOUS
  Filled 2014-02-23: qty 0.6

## 2014-02-23 MED ORDER — HEPARIN SOD (PORK) LOCK FLUSH 100 UNIT/ML IV SOLN
500.0000 [IU] | Freq: Once | INTRAVENOUS | Status: AC | PRN
  Administered 2014-02-23: 500 [IU]
  Filled 2014-02-23: qty 5

## 2014-02-23 NOTE — Progress Notes (Signed)
neulasta given by flush nurse

## 2014-02-23 NOTE — Patient Instructions (Signed)
Pegfilgrastim injection What is this medicine? PEGFILGRASTIM (peg fil GRA stim) is a long-acting granulocyte colony-stimulating factor that stimulates the growth of neutrophils, a type of white blood cell important in the body's fight against infection. It is used to reduce the incidence of fever and infection in patients with certain types of cancer who are receiving chemotherapy that affects the bone marrow. This medicine may be used for other purposes; ask your health care provider or pharmacist if you have questions. COMMON BRAND NAME(S): Neulasta What should I tell my health care provider before I take this medicine? They need to know if you have any of these conditions: -latex allergy -ongoing radiation therapy -sickle cell disease -skin reactions to acrylic adhesives (On-Body Injector only) -an unusual or allergic reaction to pegfilgrastim, filgrastim, other medicines, foods, dyes, or preservatives -pregnant or trying to get pregnant -breast-feeding How should I use this medicine? This medicine is for injection under the skin. If you get this medicine at home, you will be taught how to prepare and give the pre-filled syringe or how to use the On-body Injector. Refer to the patient Instructions for Use for detailed instructions. Use exactly as directed. Take your medicine at regular intervals. Do not take your medicine more often than directed. It is important that you put your used needles and syringes in a special sharps container. Do not put them in a trash can. If you do not have a sharps container, call your pharmacist or healthcare provider to get one. Talk to your pediatrician regarding the use of this medicine in children. Special care may be needed. Overdosage: If you think you have taken too much of this medicine contact a poison control center or emergency room at once. NOTE: This medicine is only for you. Do not share this medicine with others. What if I miss a dose? It is  important not to miss your dose. Call your doctor or health care professional if you miss your dose. If you miss a dose due to an On-body Injector failure or leakage, a new dose should be administered as soon as possible using a single prefilled syringe for manual use. What may interact with this medicine? Interactions have not been studied. Give your health care provider a list of all the medicines, herbs, non-prescription drugs, or dietary supplements you use. Also tell them if you smoke, drink alcohol, or use illegal drugs. Some items may interact with your medicine. This list may not describe all possible interactions. Give your health care provider a list of all the medicines, herbs, non-prescription drugs, or dietary supplements you use. Also tell them if you smoke, drink alcohol, or use illegal drugs. Some items may interact with your medicine. What should I watch for while using this medicine? You may need blood work done while you are taking this medicine. If you are going to need a MRI, CT scan, or other procedure, tell your doctor that you are using this medicine (On-Body Injector only). What side effects may I notice from receiving this medicine? Side effects that you should report to your doctor or health care professional as soon as possible: -allergic reactions like skin rash, itching or hives, swelling of the face, lips, or tongue -dizziness -fever -pain, redness, or irritation at site where injected -pinpoint red spots on the skin -shortness of breath or breathing problems -stomach or side pain, or pain at the shoulder -swelling -tiredness -trouble passing urine Side effects that usually do not require medical attention (report to your doctor   or health care professional if they continue or are bothersome): -bone pain -muscle pain This list may not describe all possible side effects. Call your doctor for medical advice about side effects. You may report side effects to FDA at  1-800-FDA-1088. Where should I keep my medicine? Keep out of the reach of children. Store pre-filled syringes in a refrigerator between 2 and 8 degrees C (36 and 46 degrees F). Do not freeze. Keep in carton to protect from light. Throw away this medicine if it is left out of the refrigerator for more than 48 hours. Throw away any unused medicine after the expiration date. NOTE: This sheet is a summary. It may not cover all possible information. If you have questions about this medicine, talk to your doctor, pharmacist, or health care provider.  2015, Elsevier/Gold Standard. (2013-04-22 16:14:05) Fluorouracil, 5FU; Diclofenac topical cream What is this medicine? FLUOROURACIL; DICLOFENAC (flure oh YOOR a sil; dye KLOE fen ak) is a combination of a topical chemotherapy agent and non-steroidal anti-inflammatory drug (NSAID). It is used on the skin to treat skin cancer and skin conditions that could become cancer. This medicine may be used for other purposes; ask your health care provider or pharmacist if you have questions. COMMON BRAND NAME(S): FLUORAC What should I tell my health care provider before I take this medicine? They need to know if you have any of these conditions: -bleeding problems -cigarette smoker -DPD enzyme deficiency -heart disease -high blood pressure -if you frequently drink alcohol containing drinks -kidney disease -liver disease -open or infected skin -stomach problems -swelling or open sores at the treatment site -recent or planned coronary artery bypass graft (CABG) surgery -an unusual or allergic reaction to fluorouracil, diclofenac, aspirin, other NSAIDs, other medicines, foods, dyes, or preservatives -pregnant or trying to get pregnant -breast-feeding How should I use this medicine? This medicine is only for use on the skin. Follow the directions on the prescription label. Wash hands before and after use. Wash affected area and gently pat dry. To apply this  medicine use a cotton-tipped applicator, or use gloves if applying with fingertips. If applied with unprotected fingertips, it is very important to wash your hands well after you apply this medicine. Avoid applying to the eyes, nose, or mouth. Apply enough medicine to cover the affected area. You can cover the area with a light gauze dressing, but do not use tight or air-tight dressings. Finish the full course prescribed by your doctor or health care professional, even if you think your condition is better. Do not stop taking except on the advice of your doctor or health care professional. Talk to your pediatrician regarding the use of this medicine in children. Special care may be needed. Overdosage: If you think you've taken too much of this medicine contact a poison control center or emergency room at once. Overdosage: If you think you have taken too much of this medicine contact a poison control center or emergency room at once. NOTE: This medicine is only for you. Do not share this medicine with others. What if I miss a dose? If you miss a dose, apply it as soon as you can. If it is almost time for your next dose, only use that dose. Do not apply extra doses. Contact your doctor or health care professional if you miss more than one dose. What may interact with this medicine? Interactions are not expected. Do not use any other skin products without telling your doctor or health care professional. This list   may not describe all possible interactions. Give your health care provider a list of all the medicines, herbs, non-prescription drugs, or dietary supplements you use. Also tell them if you smoke, drink alcohol, or use illegal drugs. Some items may interact with your medicine. What should I watch for while using this medicine? Visit your doctor or health care professional for checks on your progress. You will need to use this medicine for 2 to 6 weeks. This may be longer depending on the condition  being treated. You may not see full healing for another 1 to 2 months after you stop using the medicine. Treated areas of skin can look unsightly during and for several weeks after treatment with this medicine. This medicine can make you more sensitive to the sun. Keep out of the sun. If you cannot avoid being in the sun, wear protective clothing and use sunscreen. Do not use sun lamps or tanning beds/booths. Where should I keep my What side effects may I notice from receiving this medicine? Side effects that you should report to your doctor or health care professional as soon as possible: -allergic reactions like skin rash, itching or hives, swelling of the face, lips, or tongue -black or bloody stools, blood in the urine or vomit -blurred vision -chest pain -difficulty breathing or wheezing -redness, blistering, peeling or loosening of the skin, including inside the mouth -severe redness and swelling of normal skin -slurred speech or weakness on one side of the body -trouble passing urine or change in the amount of urine -unexplained weight gain or swelling -unusually weak or tired -yellowing of eyes or skin Side effects that usually do not require medical attention (Report these to your doctor or health care professional if they continue or are bothersome.): -increased sensitivity of the skin to sun and ultraviolet light -pain and burning of the affected area -scaling or swelling of the affected area -skin rash, itching of the affected area -tenderness This list may not describe all possible side effects. Call your doctor for medical advice about side effects. You may report side effects to FDA at 1-800-FDA-1088. Where should I keep my medicine? Keep out of the reach of children. Store at room temperature between 20 and 25 degrees C (68 and 77 degrees F). Throw away any unused medicine after the expiration date. NOTE: This sheet is a summary. It may not cover all possible information.  If you have questions about this medicine, talk to your doctor, pharmacist, or health care provider.  2015, Elsevier/Gold Standard. (2013-05-24 11:09:58)  

## 2014-02-24 ENCOUNTER — Ambulatory Visit (INDEPENDENT_AMBULATORY_CARE_PROVIDER_SITE_OTHER): Admitting: Podiatry

## 2014-02-24 ENCOUNTER — Encounter: Payer: Self-pay | Admitting: Podiatry

## 2014-02-24 DIAGNOSIS — G5790 Unspecified mononeuropathy of unspecified lower limb: Secondary | ICD-10-CM

## 2014-02-24 DIAGNOSIS — M79606 Pain in leg, unspecified: Secondary | ICD-10-CM

## 2014-02-24 DIAGNOSIS — B351 Tinea unguium: Secondary | ICD-10-CM

## 2014-02-24 NOTE — Progress Notes (Signed)
   Subjective:    Patient ID: Ray [name], male    DOB: 12-25-1955, 59 y.o.   MRN: [id number]  HPI  PT STATED IS CANCER PT AND HAVE DISCOLORATION ON B/L TOENAILS FOR 2 MONTHS. THE TOENAILS ARE GETTING WORSE AND GET AGGRAVATED WHEN TRIMMING THE TOENAILS. TRIED OTC FOR DRY SKIN BUT NOT HELPING.  Review of Systems  HENT: Positive for sinus pressure.   Musculoskeletal: Positive for joint swelling and gait problem.  Skin: Positive for color change.  All other systems reviewed and are negative.      Objective:   Physical Exam he presents today with chief complaint of painful elongated toenails. I have reviewed his past medical history medications allergies are social history and review of systems. Currently his continues chemotherapy for pancreatic cancer. Pulses are palpable bilateral. Decreased sensorium persimmons once the monofilament. Muscle strength +5 over 5 dorsiflexion plantar flexors and inverters everters alters musculatures intact. Cutaneous evaluation and straight supple well-hydrated cutis thick discolored dystrophic and possibly mycotic nails bilateral. Orthopedic evaluation and benefits all joints distal to the ankle before range of motion without crepitus with some limitation on range of motion of the first metatarsophalangeal joint.        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  Plan: Debrided nails 1 through 5 bilaterally. Discussed B complex vitamins to help with the neuropathy and once he has completed chemotherapy will consider medication for his toenails.

## 2014-03-06 ENCOUNTER — Other Ambulatory Visit: Payer: Self-pay | Admitting: Oncology

## 2014-03-07 ENCOUNTER — Telehealth: Payer: Self-pay | Admitting: Oncology

## 2014-03-07 ENCOUNTER — Other Ambulatory Visit (HOSPITAL_BASED_OUTPATIENT_CLINIC_OR_DEPARTMENT_OTHER)

## 2014-03-07 ENCOUNTER — Other Ambulatory Visit: Payer: Self-pay | Admitting: Oncology

## 2014-03-07 ENCOUNTER — Ambulatory Visit (HOSPITAL_BASED_OUTPATIENT_CLINIC_OR_DEPARTMENT_OTHER): Admitting: Nurse Practitioner

## 2014-03-07 ENCOUNTER — Ambulatory Visit (HOSPITAL_BASED_OUTPATIENT_CLINIC_OR_DEPARTMENT_OTHER)

## 2014-03-07 VITALS — BP 129/85 | HR 96 | Temp 98.9°F | Resp 20 | Ht 70.0 in | Wt 169.0 lb

## 2014-03-07 DIAGNOSIS — C787 Secondary malignant neoplasm of liver and intrahepatic bile duct: Secondary | ICD-10-CM

## 2014-03-07 DIAGNOSIS — R112 Nausea with vomiting, unspecified: Secondary | ICD-10-CM

## 2014-03-07 DIAGNOSIS — C257 Malignant neoplasm of other parts of pancreas: Secondary | ICD-10-CM

## 2014-03-07 DIAGNOSIS — I82890 Acute embolism and thrombosis of other specified veins: Secondary | ICD-10-CM

## 2014-03-07 DIAGNOSIS — C259 Malignant neoplasm of pancreas, unspecified: Secondary | ICD-10-CM

## 2014-03-07 DIAGNOSIS — C252 Malignant neoplasm of tail of pancreas: Secondary | ICD-10-CM

## 2014-03-07 DIAGNOSIS — Z8719 Personal history of other diseases of the digestive system: Secondary | ICD-10-CM

## 2014-03-07 DIAGNOSIS — Z5111 Encounter for antineoplastic chemotherapy: Secondary | ICD-10-CM

## 2014-03-07 DIAGNOSIS — C251 Malignant neoplasm of body of pancreas: Secondary | ICD-10-CM

## 2014-03-07 DIAGNOSIS — G893 Neoplasm related pain (acute) (chronic): Secondary | ICD-10-CM

## 2014-03-07 DIAGNOSIS — I10 Essential (primary) hypertension: Secondary | ICD-10-CM

## 2014-03-07 LAB — COMPREHENSIVE METABOLIC PANEL (CC13)
ALT: 10 U/L (ref 0–55)
AST: 9 U/L (ref 5–34)
Albumin: 3.6 g/dL (ref 3.5–5.0)
Alkaline Phosphatase: 149 U/L (ref 40–150)
Anion Gap: 12 mEq/L — ABNORMAL HIGH (ref 3–11)
BILIRUBIN TOTAL: 0.2 mg/dL (ref 0.20–1.20)
BUN: 8.3 mg/dL (ref 7.0–26.0)
CO2: 24 meq/L (ref 22–29)
CREATININE: 0.9 mg/dL (ref 0.7–1.3)
Calcium: 8.5 mg/dL (ref 8.4–10.4)
Chloride: 106 mEq/L (ref 98–109)
GLUCOSE: 100 mg/dL (ref 70–140)
POTASSIUM: 4.1 meq/L (ref 3.5–5.1)
Sodium: 143 mEq/L (ref 136–145)
Total Protein: 5.9 g/dL — ABNORMAL LOW (ref 6.4–8.3)

## 2014-03-07 LAB — CBC WITH DIFFERENTIAL/PLATELET
BASO%: 0.8 % (ref 0.0–2.0)
Basophils Absolute: 0.1 10*3/uL (ref 0.0–0.1)
EOS ABS: 0 10*3/uL (ref 0.0–0.5)
EOS%: 0.3 % (ref 0.0–7.0)
HCT: 37.2 % — ABNORMAL LOW (ref 38.4–49.9)
HEMOGLOBIN: 11.6 g/dL — AB (ref 13.0–17.1)
LYMPH%: 18.7 % (ref 14.0–49.0)
MCH: 27.2 pg (ref 27.2–33.4)
MCHC: 31.3 g/dL — ABNORMAL LOW (ref 32.0–36.0)
MCV: 86.9 fL (ref 79.3–98.0)
MONO#: 0.9 10*3/uL (ref 0.1–0.9)
MONO%: 6.5 % (ref 0.0–14.0)
NEUT%: 73.7 % (ref 39.0–75.0)
NEUTROS ABS: 10.5 10*3/uL — AB (ref 1.5–6.5)
Platelets: 137 10*3/uL — ABNORMAL LOW (ref 140–400)
RBC: 4.28 10*6/uL (ref 4.20–5.82)
RDW: 22.7 % — AB (ref 11.0–14.6)
WBC: 14.3 10*3/uL — ABNORMAL HIGH (ref 4.0–10.3)
lymph#: 2.7 10*3/uL (ref 0.9–3.3)

## 2014-03-07 LAB — CANCER ANTIGEN 19-9: CA 19-9: 2073 U/mL — ABNORMAL HIGH (ref <35.0)

## 2014-03-07 MED ORDER — ATROPINE SULFATE 1 MG/ML IJ SOLN
0.5000 mg | Freq: Once | INTRAMUSCULAR | Status: AC | PRN
  Administered 2014-03-07: 0.5 mg via INTRAVENOUS

## 2014-03-07 MED ORDER — DEXAMETHASONE SODIUM PHOSPHATE 20 MG/5ML IJ SOLN
12.0000 mg | Freq: Once | INTRAMUSCULAR | Status: AC
  Administered 2014-03-07: 12 mg via INTRAVENOUS

## 2014-03-07 MED ORDER — OXYCODONE-ACETAMINOPHEN 10-325 MG PO TABS: ORAL_TABLET | ORAL | Status: DC

## 2014-03-07 MED ORDER — MORPHINE SULFATE ER 30 MG PO TBCR: 30.0000 mg | EXTENDED_RELEASE_TABLET | Freq: Two times a day (BID) | ORAL | Status: DC

## 2014-03-07 MED ORDER — SODIUM CHLORIDE 0.9 % IV SOLN
2400.0000 mg/m2 | INTRAVENOUS | Status: DC
  Administered 2014-03-07: 4750 mg via INTRAVENOUS
  Filled 2014-03-07: qty 95

## 2014-03-07 MED ORDER — ATROPINE SULFATE 1 MG/ML IJ SOLN
INTRAMUSCULAR | Status: AC
  Filled 2014-03-07: qty 1

## 2014-03-07 MED ORDER — DEXAMETHASONE SODIUM PHOSPHATE 20 MG/5ML IJ SOLN
INTRAMUSCULAR | Status: AC
  Filled 2014-03-07: qty 5

## 2014-03-07 MED ORDER — PALONOSETRON HCL INJECTION 0.25 MG/5ML
INTRAVENOUS | Status: AC
  Filled 2014-03-07: qty 5

## 2014-03-07 MED ORDER — IRINOTECAN HCL CHEMO INJECTION 100 MG/5ML
140.0000 mg/m2 | Freq: Once | INTRAVENOUS | Status: AC
  Administered 2014-03-07: 276 mg via INTRAVENOUS
  Filled 2014-03-07: qty 13.8

## 2014-03-07 MED ORDER — SODIUM CHLORIDE 0.9 % IV SOLN
Freq: Once | INTRAVENOUS | Status: AC
  Administered 2014-03-07: 10:00:00 via INTRAVENOUS

## 2014-03-07 MED ORDER — LEUCOVORIN CALCIUM INJECTION 350 MG
400.0000 mg/m2 | Freq: Once | INTRAVENOUS | Status: AC
  Administered 2014-03-07: 800 mg via INTRAVENOUS
  Filled 2014-03-07: qty 40

## 2014-03-07 MED ORDER — SODIUM CHLORIDE 0.9 % IV SOLN
150.0000 mg | Freq: Once | INTRAVENOUS | Status: AC
  Administered 2014-03-07: 150 mg via INTRAVENOUS
  Filled 2014-03-07: qty 5

## 2014-03-07 MED ORDER — PALONOSETRON HCL INJECTION 0.25 MG/5ML
0.2500 mg | Freq: Once | INTRAVENOUS | Status: AC
  Administered 2014-03-07: 0.25 mg via INTRAVENOUS

## 2014-03-07 NOTE — Progress Notes (Signed)
[facility] OFFICE PROGRESS NOTE   Diagnosis:  Pancreas cancer  INTERVAL HISTORY:   [name] returns as scheduled. He continues every 2 week FOLFIRI. He overall feels well. He notes some increase in fatigue. He has a good appetite. He is gaining weight. No nausea or vomiting. No diarrhea. No significant mouth sores. Over the last few days he has noted increased left-sided abdominal "discomfort". He continues MS Contin with Percocet as needed.  Objective:  Vital signs in last 24 hours:  Blood pressure 129/85, pulse 96, temperature 98.9 F (37.2 C), temperature source Oral, resp. rate 20, height 5\' 10"  (1.778 m), weight 169 lb (76.658 kg), SpO2 99 %.    HEENT: Small abrasion right upper inner lip. No thrush. Resp: Lungs clear bilaterally. Cardio: Regular rate and rhythm. GI: Abdomen soft and nontender. No hepatomegaly. No mass. Vascular: No leg edema. Calves soft and nontender.  Skin: Dry appearing.    Lab Results:  Lab Results  Component Value Date   WBC 14.3* 03/07/2014   HGB 11.6* 03/07/2014   HCT 37.2* 03/07/2014   MCV 86.9 03/07/2014   PLT 137* 03/07/2014   NEUTROABS 10.5* 03/07/2014    Imaging:  No results found.  Medications: I have reviewed the patient's current medications.  Assessment/Plan: 1. Pancreas cancer.  07/06/2013 ,CA 19-9- 2067.   CT abdomen/pelvis 07/09/2013 with interval development of amorphous soft tissue circumferentially encasing the celiac axis and tracking caudally down toward the SMA. Associated stable irregular mild distention of the main pancreatic duct in the body and tail of the pancreas with no discrete mass lesion seen. 2.2 x 1.4 cm portacaval lymph node in the hepatoduodenal ligament had increased in size from 1.3 x 1.1 cm previously. Other scattered small lymph nodes seen in the hepatoduodenal ligament.   Endoscopic ultrasound 07/15/2013 with a very vague masslike lesion in the region of the uncinate pancreas.  Fine needle aspiration showed atypical cells consistent with adenocarcinoma.   PET scan 07/29/2013 with hypermetabolic soft tissue posterior to the pancreas with an adjacent hypermetabolic lymph node, no hypermetabolic pancreas mass, no evidence of distant metastatic disease   Cycle 1 FOLFIRINOX 08/02/2013.   Cycle 2 FOLFIRINOX 08/16/2013.   Cycle 3 FOLFIRINOX 09/06/2013.   Cycle 4 FOLFIRINOX 09/21/2013.   CA 19-9 improved on 10/04/2013.   Cycle 5 FOLFIRINOX 10/04/2013.   Cycle 6 FOLFIRINOX 10/18/2013   Restaging CT 10/22/2013 with no change in the soft tissue surrounding the pancreas/celiac axis, scattered indeterminate low-attenuation liver lesions are more conspicuous.  Cycle 7 FOLFIRINOX 11/08/2013  Cycle 8 FOLFIRINOX 11/22/2013  Cycle 9 FOLFIRINOX 12/06/2013  Cycle 10 FOLFIRI (oxaliplatin eliminated) 12/20/2013.  Cycle 11 FOLFIRI 01/03/2014  Restaging CT 01/12/2014-no change in the soft tissue posterior to the pancreas or an 8 mm hepatic metastasis  Cycle 12 FOLFIRI 01/17/2014.  Cycle 13 FOLFIRI 02/07/2014  Cycle 14 FOLFIRI 02/21/2014 2. Abdominal pain secondary to #1.  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 3. Hypertension. 4. History of gout. 5. Nausea and vomiting following FOLFIRINOX chemotherapy.  Aloxi and Emend added with cycle 2.   Decadron added with cycle 3. 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 7. Hospitalization 08/24/2013 through 08/27/2013 with poorly controlled nausea/vomiting. Diarrhea during the hospitalization. 8. Skin hyperpigmentation and dryness secondary to 5-fluorouracil-he will continue a moisturizer cream   Disposition: Mr. Dinovo appears stable. Plan to continue every 2 week FOLFIRI. We will see him in follow-up in 2 weeks.    Ned Card ANP/GNP-BC   03/07/2014  9:02 AM

## 2014-03-07 NOTE — Patient Instructions (Signed)
[facility] Discharge Instructions for Patients Receiving Chemotherapy  Today you received the following chemotherapy agents: Irinotecan, Leucovorin, and Adrucil.  To help prevent nausea and vomiting after your treatment, we encourage you to take your nausea medication as prescribed.   If you develop nausea and vomiting that is not controlled by your nausea medication, call the clinic.   BELOW ARE SYMPTOMS THAT SHOULD BE REPORTED IMMEDIATELY:  *FEVER GREATER THAN 100.5 F  *CHILLS WITH OR WITHOUT FEVER  NAUSEA AND VOMITING THAT IS NOT CONTROLLED WITH YOUR NAUSEA MEDICATION  *UNUSUAL SHORTNESS OF BREATH  *UNUSUAL BRUISING OR BLEEDING  TENDERNESS IN MOUTH AND THROAT WITH OR WITHOUT PRESENCE OF ULCERS  *URINARY PROBLEMS  *BOWEL PROBLEMS  UNUSUAL RASH Items with * indicate a potential emergency and should be followed up as soon as possible.  Feel free to call the clinic you have any questions or concerns. The clinic phone number is [phone].

## 2014-03-07 NOTE — Telephone Encounter (Signed)
gv adn printed appt sched adn avs for pt for Feb and March....sed added tx.

## 2014-03-09 ENCOUNTER — Encounter: Payer: Self-pay | Admitting: *Deleted

## 2014-03-09 ENCOUNTER — Ambulatory Visit (HOSPITAL_BASED_OUTPATIENT_CLINIC_OR_DEPARTMENT_OTHER)

## 2014-03-09 ENCOUNTER — Ambulatory Visit

## 2014-03-09 DIAGNOSIS — C252 Malignant neoplasm of tail of pancreas: Secondary | ICD-10-CM

## 2014-03-09 DIAGNOSIS — C787 Secondary malignant neoplasm of liver and intrahepatic bile duct: Secondary | ICD-10-CM

## 2014-03-09 DIAGNOSIS — C251 Malignant neoplasm of body of pancreas: Secondary | ICD-10-CM

## 2014-03-09 DIAGNOSIS — C259 Malignant neoplasm of pancreas, unspecified: Secondary | ICD-10-CM

## 2014-03-09 DIAGNOSIS — Z5189 Encounter for other specified aftercare: Secondary | ICD-10-CM

## 2014-03-09 DIAGNOSIS — Z8719 Personal history of other diseases of the digestive system: Secondary | ICD-10-CM

## 2014-03-09 MED ORDER — HEPARIN SOD (PORK) LOCK FLUSH 100 UNIT/ML IV SOLN
500.0000 [IU] | Freq: Once | INTRAVENOUS | Status: AC | PRN
  Administered 2014-03-09: 500 [IU]
  Filled 2014-03-09: qty 5

## 2014-03-09 MED ORDER — PEGFILGRASTIM INJECTION 6 MG/0.6ML
6.0000 mg | Freq: Once | SUBCUTANEOUS | Status: AC
  Administered 2014-03-09: 6 mg via SUBCUTANEOUS
  Filled 2014-03-09: qty 0.6

## 2014-03-09 MED ORDER — SODIUM CHLORIDE 0.9 % IJ SOLN
10.0000 mL | INTRAMUSCULAR | Status: DC | PRN
  Administered 2014-03-09: 10 mL
  Filled 2014-03-09: qty 10

## 2014-03-09 NOTE — Progress Notes (Signed)
CSW mailed two copies of the advance directives packet to patient's home.  Encouraged patient to follow up with CSWs to complete directives.  Polo Riley, MSW, LCSW, OSW-C Clinical Social Worker [facility] [phone]

## 2014-03-09 NOTE — Patient Instructions (Signed)
Pegfilgrastim injection What is this medicine? PEGFILGRASTIM (peg fil GRA stim) is a long-acting granulocyte colony-stimulating factor that stimulates the growth of neutrophils, a type of white blood cell important in the body's fight against infection. It is used to reduce the incidence of fever and infection in patients with certain types of cancer who are receiving chemotherapy that affects the bone marrow. This medicine may be used for other purposes; ask your health care provider or pharmacist if you have questions. COMMON BRAND NAME(S): Neulasta What should I tell my health care provider before I take this medicine? They need to know if you have any of these conditions: -latex allergy -ongoing radiation therapy -sickle cell disease -skin reactions to acrylic adhesives (On-Body Injector only) -an unusual or allergic reaction to pegfilgrastim, filgrastim, other medicines, foods, dyes, or preservatives -pregnant or trying to get pregnant -breast-feeding How should I use this medicine? This medicine is for injection under the skin. If you get this medicine at home, you will be taught how to prepare and give the pre-filled syringe or how to use the On-body Injector. Refer to the patient Instructions for Use for detailed instructions. Use exactly as directed. Take your medicine at regular intervals. Do not take your medicine more often than directed. It is important that you put your used needles and syringes in a special sharps container. Do not put them in a trash can. If you do not have a sharps container, call your pharmacist or healthcare provider to get one. Talk to your pediatrician regarding the use of this medicine in children. Special care may be needed. Overdosage: If you think you have taken too much of this medicine contact a poison control center or emergency room at once. NOTE: This medicine is only for you. Do not share this medicine with others. What if I miss a dose? It is  important not to miss your dose. Call your doctor or health care professional if you miss your dose. If you miss a dose due to an On-body Injector failure or leakage, a new dose should be administered as soon as possible using a single prefilled syringe for manual use. What may interact with this medicine? Interactions have not been studied. Give your health care provider a list of all the medicines, herbs, non-prescription drugs, or dietary supplements you use. Also tell them if you smoke, drink alcohol, or use illegal drugs. Some items may interact with your medicine. This list may not describe all possible interactions. Give your health care provider a list of all the medicines, herbs, non-prescription drugs, or dietary supplements you use. Also tell them if you smoke, drink alcohol, or use illegal drugs. Some items may interact with your medicine. What should I watch for while using this medicine? You may need blood work done while you are taking this medicine. If you are going to need a MRI, CT scan, or other procedure, tell your doctor that you are using this medicine (On-Body Injector only). What side effects may I notice from receiving this medicine? Side effects that you should report to your doctor or health care professional as soon as possible: -allergic reactions like skin rash, itching or hives, swelling of the face, lips, or tongue -dizziness -fever -pain, redness, or irritation at site where injected -pinpoint red spots on the skin -shortness of breath or breathing problems -stomach or side pain, or pain at the shoulder -swelling -tiredness -trouble passing urine Side effects that usually do not require medical attention (report to your doctor   or health care professional if they continue or are bothersome): -bone pain -muscle pain This list may not describe all possible side effects. Call your doctor for medical advice about side effects. You may report side effects to FDA at  1-800-FDA-1088. Where should I keep my medicine? Keep out of the reach of children. Store pre-filled syringes in a refrigerator between 2 and 8 degrees C (36 and 46 degrees F). Do not freeze. Keep in carton to protect from light. Throw away this medicine if it is left out of the refrigerator for more than 48 hours. Throw away any unused medicine after the expiration date. NOTE: This sheet is a summary. It may not cover all possible information. If you have questions about this medicine, talk to your doctor, pharmacist, or health care provider.  2015, Elsevier/Gold Standard. (2013-04-22 16:14:05) Fluorouracil, 5FU; Diclofenac topical cream What is this medicine? FLUOROURACIL; DICLOFENAC (flure oh YOOR a sil; dye KLOE fen ak) is a combination of a topical chemotherapy agent and non-steroidal anti-inflammatory drug (NSAID). It is used on the skin to treat skin cancer and skin conditions that could become cancer. This medicine may be used for other purposes; ask your health care provider or pharmacist if you have questions. COMMON BRAND NAME(S): FLUORAC What should I tell my health care provider before I take this medicine? They need to know if you have any of these conditions: -bleeding problems -cigarette smoker -DPD enzyme deficiency -heart disease -high blood pressure -if you frequently drink alcohol containing drinks -kidney disease -liver disease -open or infected skin -stomach problems -swelling or open sores at the treatment site -recent or planned coronary artery bypass graft (CABG) surgery -an unusual or allergic reaction to fluorouracil, diclofenac, aspirin, other NSAIDs, other medicines, foods, dyes, or preservatives -pregnant or trying to get pregnant -breast-feeding How should I use this medicine? This medicine is only for use on the skin. Follow the directions on the prescription label. Wash hands before and after use. Wash affected area and gently pat dry. To apply this  medicine use a cotton-tipped applicator, or use gloves if applying with fingertips. If applied with unprotected fingertips, it is very important to wash your hands well after you apply this medicine. Avoid applying to the eyes, nose, or mouth. Apply enough medicine to cover the affected area. You can cover the area with a light gauze dressing, but do not use tight or air-tight dressings. Finish the full course prescribed by your doctor or health care professional, even if you think your condition is better. Do not stop taking except on the advice of your doctor or health care professional. Talk to your pediatrician regarding the use of this medicine in children. Special care may be needed. Overdosage: If you think you've taken too much of this medicine contact a poison control center or emergency room at once. Overdosage: If you think you have taken too much of this medicine contact a poison control center or emergency room at once. NOTE: This medicine is only for you. Do not share this medicine with others. What if I miss a dose? If you miss a dose, apply it as soon as you can. If it is almost time for your next dose, only use that dose. Do not apply extra doses. Contact your doctor or health care professional if you miss more than one dose. What may interact with this medicine? Interactions are not expected. Do not use any other skin products without telling your doctor or health care professional. This list   may not describe all possible interactions. Give your health care provider a list of all the medicines, herbs, non-prescription drugs, or dietary supplements you use. Also tell them if you smoke, drink alcohol, or use illegal drugs. Some items may interact with your medicine. What should I watch for while using this medicine? Visit your doctor or health care professional for checks on your progress. You will need to use this medicine for 2 to 6 weeks. This may be longer depending on the condition  being treated. You may not see full healing for another 1 to 2 months after you stop using the medicine. Treated areas of skin can look unsightly during and for several weeks after treatment with this medicine. This medicine can make you more sensitive to the sun. Keep out of the sun. If you cannot avoid being in the sun, wear protective clothing and use sunscreen. Do not use sun lamps or tanning beds/booths. Where should I keep my What side effects may I notice from receiving this medicine? Side effects that you should report to your doctor or health care professional as soon as possible: -allergic reactions like skin rash, itching or hives, swelling of the face, lips, or tongue -black or bloody stools, blood in the urine or vomit -blurred vision -chest pain -difficulty breathing or wheezing -redness, blistering, peeling or loosening of the skin, including inside the mouth -severe redness and swelling of normal skin -slurred speech or weakness on one side of the body -trouble passing urine or change in the amount of urine -unexplained weight gain or swelling -unusually weak or tired -yellowing of eyes or skin Side effects that usually do not require medical attention (Report these to your doctor or health care professional if they continue or are bothersome.): -increased sensitivity of the skin to sun and ultraviolet light -pain and burning of the affected area -scaling or swelling of the affected area -skin rash, itching of the affected area -tenderness This list may not describe all possible side effects. Call your doctor for medical advice about side effects. You may report side effects to FDA at 1-800-FDA-1088. Where should I keep my medicine? Keep out of the reach of children. Store at room temperature between 20 and 25 degrees C (68 and 77 degrees F). Throw away any unused medicine after the expiration date. NOTE: This sheet is a summary. It may not cover all possible information.  If you have questions about this medicine, talk to your doctor, pharmacist, or health care provider.  2015, Elsevier/Gold Standard. (2013-05-24 11:09:58)  

## 2014-03-09 NOTE — Progress Notes (Signed)
Neulasta injection given by flush nurse 

## 2014-03-20 ENCOUNTER — Other Ambulatory Visit: Payer: Self-pay | Admitting: Oncology

## 2014-03-21 ENCOUNTER — Ambulatory Visit

## 2014-03-21 ENCOUNTER — Other Ambulatory Visit: Payer: Self-pay | Admitting: Nurse Practitioner

## 2014-03-21 ENCOUNTER — Ambulatory Visit: Admitting: Oncology

## 2014-03-21 ENCOUNTER — Other Ambulatory Visit

## 2014-03-22 ENCOUNTER — Telehealth: Payer: Self-pay | Admitting: Gastroenterology

## 2014-03-22 NOTE — Telephone Encounter (Signed)
Pt had a "block" in the past and would like to discuss this again for the abd pain.  Pt appt for tomorrow with Dr Ardis Hughs

## 2014-03-23 ENCOUNTER — Telehealth: Payer: Self-pay | Admitting: *Deleted

## 2014-03-23 ENCOUNTER — Ambulatory Visit

## 2014-03-23 ENCOUNTER — Ambulatory Visit (INDEPENDENT_AMBULATORY_CARE_PROVIDER_SITE_OTHER): Payer: Medicare Other | Admitting: Gastroenterology

## 2014-03-23 ENCOUNTER — Encounter (HOSPITAL_COMMUNITY): Payer: Self-pay | Admitting: *Deleted

## 2014-03-23 ENCOUNTER — Encounter

## 2014-03-23 ENCOUNTER — Encounter: Payer: Self-pay | Admitting: Gastroenterology

## 2014-03-23 VITALS — BP 105/70 | HR 92 | Ht 72.0 in | Wt 170.0 lb

## 2014-03-23 DIAGNOSIS — C259 Malignant neoplasm of pancreas, unspecified: Secondary | ICD-10-CM | POA: Diagnosis not present

## 2014-03-23 NOTE — Telephone Encounter (Signed)
Patient called requesting "percocet pain refill for ten pills.  I have a prescription for a larger number but can't get it refilled.  I have two pills left and would like a small amount until I can get this refilled."  Will notify providers of this request.  Next F/U is 04-04-2014 with APP Ned Card.   [facility] [place]. Reports "the last refill was in January for a fifteen day supply.  We do not have a script for February and he should be able to refill."  Called patient with these instructions.  Reports they usually make him wait 30-days but he will try.

## 2014-03-23 NOTE — Progress Notes (Signed)
Review of pertinent gastrointestinal problems: 1. Pancreas cancer 07/06/2013 ,CA 19-9- 2067.   CT abdomen/pelvis 07/09/2013 with interval development of amorphous soft tissue circumferentially encasing the celiac axis and tracking caudally down toward the SMA. Associated stable irregular mild distention of the main pancreatic duct in the body and tail of the pancreas with no discrete mass lesion seen. 2.2 x 1.4 cm portacaval lymph node in the hepatoduodenal ligament had increased in size from 1.3 x 1.1 cm previously. Other scattered small lymph nodes seen in the hepatoduodenal ligament.   Endoscopic ultrasound 07/15/2013 with a very vague masslike lesion in the region of the uncinate pancreas. Fine needle aspiration showed atypical cells consistent with adenocarcinoma.   PET scan 07/29/2013 with hypermetabolic soft tissue posterior to the pancreas with an adjacent hypermetabolic lymph node, no hypermetabolic pancreas mass, no evidence of distant metastatic disease  EUS guided CPN 08/2013 Dr. Ardis Hughs  HPI: This is a   very pleasant 59 year old man with pancreatic cancer, locally invasive, likely metastatic as well. I have not seen him in 9-10 months. Our last visit I performed a celiac plexus neural lysis and he tells me he got "a lot of relief" out of that. He is gradually having worsening abdominal pain. I reviewed his CT scan and there really isn't any huge difference in the encasement of the celiac trunk.  He is requiring multiple Percocets daily and this leaves him groggy and fairly constipated. He is hoping to get a repeat celiac plexus neural lysis    Past Medical History  Diagnosis Date  . Blood transfusion   . GERD (gastroesophageal reflux disease)   . Hyperlipidemia   . Hypertension   . H/O hiatal hernia   . Arthritis     in his back, hips, knees, R shoulder   . Colon polyps     TUBULAR ADENOMA  . Splenic vein thrombosis   . Liver cyst   . Hx of pancreatitis   . Gout   .  Pancreatic cancer     Past Surgical History  Procedure Laterality Date  . Hip surgery      x2 right hip- retained hardware ambulates with cane  . Shoulder surgery      right  . Hand surgery      left thumb tendon repair  . Femur hardware removal  2011    R femur- prothesis removed & replaced  . Posterior cervical fusion/foraminotomy N/A 04/20/2012    Procedure: POSTERIOR CERVICAL FUSION/FORAMINOTOMY LEVEL 1;  Surgeon: Ophelia Charter, MD;  Location: [facility];  Service: Neurosurgery;  Laterality: N/A;  C1/2 laminectomies with posterior screw fixation  . Colonoscopy w/ biopsies  04/15/2011  . Eus N/A 06/10/2013    Procedure: UPPER ENDOSCOPIC ULTRASOUND (EUS) LINEAR;  Surgeon: Milus Banister, MD;  Location: [facility];  Service: Endoscopy;  Laterality: N/A;  . Eus Bilateral 07/15/2013    Procedure: ESOPHAGEAL ENDOSCOPIC ULTRASOUND (EUS) RADIAL;  Surgeon: Milus Banister, MD;  Location: [facility];  Service: Endoscopy;  Laterality: Bilateral;  . Eus N/A 08/05/2013    Procedure: ESOPHAGEAL ENDOSCOPIC ULTRASOUND (EUS) RADIAL;  Surgeon: Milus Banister, MD;  Location: [facility];  Service: Endoscopy;  Laterality: N/A;  . Neurolytic celiac plexus N/A 08/05/2013    Procedure: NEUROLYTIC CELIAC PLEXUS;  Surgeon: Milus Banister, MD;  Location: [facility];  Service: Endoscopy;  Laterality: N/A;    Current Outpatient Prescriptions  Medication Sig Dispense Refill  . amLODipine (NORVASC) 10 MG tablet Take 1 tablet (10 mg  total) by mouth daily. 90 tablet 1  . baclofen (LIORESAL) 10 MG tablet Take 1 tablet (10 mg total) by mouth 3 (three) times daily with meals as needed (hiccups). 30 each 0  . dexamethasone (DECADRON) 4 MG tablet TAKE 2 TABLETS TWICE DAILY FOR 3 DAYS BEGINNING THE DAY AFTER CHEMO 24 tablet 1  . DULoxetine (CYMBALTA) 30 MG capsule TAKE 1 CAPSULE DAILY 90 capsule 0  . lidocaine-prilocaine (EMLA) cream Apply over port area 1-2 hours prior to treatment and cover with plastic wrap.   DO NOT RUB IN. 30 g PRN  . loperamide (IMODIUM) 2 MG capsule Take 1 capsule (2 mg total) by mouth as needed for diarrhea or loose stools. Maximum 8 tabs per day 30 capsule 2  . morphine (MS CONTIN) 30 MG 12 hr tablet Take 1 tablet (30 mg total) by mouth every 12 (twelve) hours. 60 tablet 0  . omeprazole (PRILOSEC) 40 MG capsule TAKE 1 CAPSULE DAILY 90 capsule 3  . oxyCODONE-acetaminophen (PERCOCET) 10-325 MG per tablet Take 1-2 tablets by mouth every 4 (four) hours as needed for pain 120 tablet 0  . Polyethyl Glycol-Propyl Glycol (SYSTANE) 0.4-0.3 % SOLN Apply 1 drop to eye 3 (three) times daily as needed (dry eyes).    . potassium chloride SA (K-DUR,KLOR-CON) 20 MEQ tablet Take 1 tablet (20 mEq total) by mouth daily. 90 tablet 2  . prochlorperazine (COMPAZINE) 10 MG tablet Take 1 tablet (10 mg total) by mouth every 6 (six) hours as needed for nausea or vomiting. 60 tablet 2  . rivaroxaban (XARELTO) 20 MG TABS tablet Take 1 tablet (20 mg total) by mouth every morning. 90 tablet 2  . sorbitol 70 % SOLN Take 15 ml - 30 ml by mouth 2 (two) times a days as needed for constipation 1000 mL 1  . VIAGRA 50 MG tablet TAKE 1 TABLET AS NEEDED 18 tablet 2   No current facility-administered medications for this visit.    Allergies as of 03/23/2014  . (No Known Allergies)    Family History  Problem Relation Age of Onset  . Colon cancer Neg Hx   . Esophageal cancer Neg Hx   . Stomach cancer Neg Hx   . Rectal cancer Neg Hx     History   Social History  . Marital Status: Married    Spouse Name: N/A  . Number of Children: 3  . Years of Education: N/A   Occupational History  . disabled [employer]   Social History Main Topics  . Smoking status: Former Smoker    Types: Cigars  . Smokeless tobacco: Never Used     Comment: one a day cigar x 6 months. Quit cigarettes -20 yrs ago.  . Alcohol Use: No     Comment: Quit heavy use 30 yrs ago.none now  . Drug Use: No  . Sexual Activity: Yes    Other Topics Concern  . Not on file   Social History Narrative   Married, wife Rolanda Lundborg w/cane      Physical Exam: BP 105/70 mmHg  Pulse 92  Ht 6' (1.829 m)  Wt 170 lb (77.111 kg)  BMI 23.05 kg/m2  SpO2 98% Constitutional: generally well-appearing Psychiatric: alert and oriented x3 Abdomen: soft, nontender, nondistended, no obvious ascites, no peritoneal signs, normal bowel sounds     Assessment and plan: 59 y.o. male with locally invasive, likely metastatic pancreatic cancer  He had great response after celiac plexus neural lysis 9 months  ago and I'm happy to repeat that for him now. I explained he might not get as dramatic response but I suspect he will at least notice some improvement in his abdominal pains. We'll get this scheduled to be done next week. He will hold his Xarelto for 1 day prior to the procedure.

## 2014-03-23 NOTE — Patient Instructions (Addendum)
Upper EUS, with celiac plexus neurolysis (next Thursday).   Hold xarelto for one day prior to the procedure (do not take it on Wednesday or Thursday AM).

## 2014-03-31 ENCOUNTER — Ambulatory Visit (HOSPITAL_COMMUNITY)
Admission: RE | Admit: 2014-03-31 | Discharge: 2014-03-31 | Disposition: A | Discharge status: Home / Self Care | Payer: Medicare Other | Source: Ambulatory Visit | Attending: Gastroenterology | Admitting: Gastroenterology

## 2014-03-31 ENCOUNTER — Ambulatory Visit (HOSPITAL_COMMUNITY): Payer: Medicare Other | Admitting: Anesthesiology

## 2014-03-31 ENCOUNTER — Encounter (HOSPITAL_COMMUNITY): Payer: Self-pay | Admitting: Gastroenterology

## 2014-03-31 ENCOUNTER — Encounter (HOSPITAL_COMMUNITY)
Admission: RE | Disposition: A | Discharge status: Home / Self Care | Payer: Self-pay | Source: Ambulatory Visit | Attending: Gastroenterology

## 2014-03-31 DIAGNOSIS — K219 Gastro-esophageal reflux disease without esophagitis: Secondary | ICD-10-CM | POA: Diagnosis not present

## 2014-03-31 DIAGNOSIS — R109 Unspecified abdominal pain: Secondary | ICD-10-CM

## 2014-03-31 DIAGNOSIS — M109 Gout, unspecified: Secondary | ICD-10-CM | POA: Insufficient documentation

## 2014-03-31 DIAGNOSIS — M13861 Other specified arthritis, right knee: Secondary | ICD-10-CM | POA: Insufficient documentation

## 2014-03-31 DIAGNOSIS — I10 Essential (primary) hypertension: Secondary | ICD-10-CM | POA: Diagnosis not present

## 2014-03-31 DIAGNOSIS — C259 Malignant neoplasm of pancreas, unspecified: Secondary | ICD-10-CM

## 2014-03-31 DIAGNOSIS — M4690 Unspecified inflammatory spondylopathy, site unspecified: Secondary | ICD-10-CM | POA: Insufficient documentation

## 2014-03-31 DIAGNOSIS — E785 Hyperlipidemia, unspecified: Secondary | ICD-10-CM | POA: Insufficient documentation

## 2014-03-31 DIAGNOSIS — Z9889 Other specified postprocedural states: Secondary | ICD-10-CM | POA: Diagnosis not present

## 2014-03-31 DIAGNOSIS — M13852 Other specified arthritis, left hip: Secondary | ICD-10-CM | POA: Diagnosis not present

## 2014-03-31 DIAGNOSIS — Z800 Family history of malignant neoplasm of digestive organs: Secondary | ICD-10-CM | POA: Diagnosis not present

## 2014-03-31 DIAGNOSIS — Z7901 Long term (current) use of anticoagulants: Secondary | ICD-10-CM | POA: Insufficient documentation

## 2014-03-31 DIAGNOSIS — M13862 Other specified arthritis, left knee: Secondary | ICD-10-CM | POA: Insufficient documentation

## 2014-03-31 DIAGNOSIS — M13851 Other specified arthritis, right hip: Secondary | ICD-10-CM | POA: Diagnosis not present

## 2014-03-31 DIAGNOSIS — Z79891 Long term (current) use of opiate analgesic: Secondary | ICD-10-CM | POA: Insufficient documentation

## 2014-03-31 DIAGNOSIS — M13811 Other specified arthritis, right shoulder: Secondary | ICD-10-CM | POA: Diagnosis not present

## 2014-03-31 DIAGNOSIS — I739 Peripheral vascular disease, unspecified: Secondary | ICD-10-CM | POA: Diagnosis not present

## 2014-03-31 DIAGNOSIS — Z86010 Personal history of colonic polyps: Secondary | ICD-10-CM | POA: Diagnosis not present

## 2014-03-31 DIAGNOSIS — G893 Neoplasm related pain (acute) (chronic): Secondary | ICD-10-CM | POA: Diagnosis not present

## 2014-03-31 DIAGNOSIS — F17200 Nicotine dependence, unspecified, uncomplicated: Secondary | ICD-10-CM | POA: Diagnosis not present

## 2014-03-31 HISTORY — PX: EUS: SHX5427

## 2014-03-31 HISTORY — PX: NEUROLYTIC CELIAC PLEXUS: SHX5435

## 2014-03-31 SURGERY — UPPER ENDOSCOPIC ULTRASOUND (EUS) LINEAR
Anesthesia: Monitor Anesthesia Care

## 2014-03-31 MED ORDER — SODIUM CHLORIDE 0.9 % IV SOLN: INTRAVENOUS | Status: DC

## 2014-03-31 MED ORDER — PROPOFOL 10 MG/ML IV BOLUS
INTRAVENOUS | Status: AC
  Filled 2014-03-31: qty 20

## 2014-03-31 MED ORDER — PROPOFOL 10 MG/ML IV BOLUS
INTRAVENOUS | Status: DC | PRN
  Administered 2014-03-31: 50 mg via INTRAVENOUS

## 2014-03-31 MED ORDER — LACTATED RINGERS IV SOLN
INTRAVENOUS | Status: DC | PRN
  Administered 2014-03-31: 09:00:00 via INTRAVENOUS

## 2014-03-31 MED ORDER — BUPIVACAINE HCL (PF) 0.25 % IJ SOLN
INTRAMUSCULAR | Status: AC
  Filled 2014-03-31: qty 30

## 2014-03-31 MED ORDER — ALCOHOL 98 % IV SOLN
20.0000 mL | Freq: Once | INTRAVENOUS | Status: AC
  Administered 2014-03-31: 20 mL
  Filled 2014-03-31: qty 20

## 2014-03-31 MED ORDER — PROPOFOL INFUSION 10 MG/ML OPTIME
INTRAVENOUS | Status: DC | PRN
  Administered 2014-03-31: 140 ug/kg/min via INTRAVENOUS

## 2014-03-31 MED ORDER — LIDOCAINE HCL (CARDIAC) 20 MG/ML IV SOLN
INTRAVENOUS | Status: AC
  Filled 2014-03-31: qty 5

## 2014-03-31 MED ORDER — LIDOCAINE HCL (PF) 2 % IJ SOLN
INTRAMUSCULAR | Status: DC | PRN
  Administered 2014-03-31: 20 mg via INTRADERMAL

## 2014-03-31 NOTE — Discharge Instructions (Signed)

## 2014-03-31 NOTE — Anesthesia Postprocedure Evaluation (Signed)
  Anesthesia Post-op Note  Patient: Ray [name]  Procedure(s) Performed: Procedure(s) (LRB): UPPER ENDOSCOPIC ULTRASOUND (EUS) LINEAR (N/A) NEUROLYTIC CELIAC PLEXUS (N/A)  Patient Location: PACU  Anesthesia Type: MAC  Level of Consciousness: awake and alert   Airway and Oxygen Therapy: Patient Spontanous Breathing  Post-op Pain: mild  Post-op Assessment: Post-op Vital signs reviewed, Patient's Cardiovascular Status Stable, Respiratory Function Stable, Patent Airway and No signs of Nausea or vomiting  Last Vitals:  Filed Vitals:   03/31/14 0930  BP: 137/90  Pulse: 82  Temp: 37 C  Resp: 18    Post-op Vital Signs: stable   Complications: No apparent anesthesia complications

## 2014-03-31 NOTE — Anesthesia Preprocedure Evaluation (Signed)
Anesthesia Evaluation  Patient identified by MRN, date of birth, ID band Patient awake    Reviewed: Allergy & Precautions, H&P , NPO status , Patient's Chart, lab work & pertinent test results  Airway Mallampati: II  TM Distance: >3 FB Neck ROM: Full    Dental no notable dental hx. (+) Teeth Intact, Dental Advisory Given   Pulmonary neg pulmonary ROS, Current Smoker,  breath sounds clear to auscultation  Pulmonary exam normal       Cardiovascular hypertension, Pt. on medications + Peripheral Vascular Disease Rhythm:Regular Rate:Normal     Neuro/Psych Raynaud's. Cervical fusion.  Neuromuscular disease negative psych ROS   GI/Hepatic Neg liver ROS, hiatal hernia, GERD-  Medicated and Poorly Controlled,Pancreatic cancer and chronic pancreatitis    Endo/Other  negative endocrine ROS  Renal/GU negative Renal ROS  negative genitourinary   Musculoskeletal negative musculoskeletal ROS (+)   Abdominal   Peds negative pediatric ROS (+)  Hematology negative hematology ROS (+) anemia ,   Anesthesia Other Findings   Reproductive/Obstetrics negative OB ROS                             Anesthesia Physical Anesthesia Plan  ASA: III  Anesthesia Plan: MAC   Post-op Pain Management:    Induction:   Airway Management Planned:   Additional Equipment:   Intra-op Plan:   Post-operative Plan:   Informed Consent:   Plan Discussed with: Surgeon  Anesthesia Plan Comments:         Anesthesia Quick Evaluation

## 2014-03-31 NOTE — Interval H&P Note (Signed)
History and Physical Interval Note:  03/31/2014 8:19 AM  [name]  has presented today for surgery, with the diagnosis of pancreatic cancer  The various methods of treatment have been discussed with the patient and family. After consideration of risks, benefits and other options for treatment, the patient has consented to  Procedure(s) with comments: UPPER ENDOSCOPIC ULTRASOUND (EUS) LINEAR (N/A) -  celiac plexus neurolysis NEUROLYTIC CELIAC PLEXUS (N/A) as a surgical intervention .  The patient's history has been reviewed, patient examined, no change in status, stable for surgery.  I have reviewed the patient's chart and labs.  Questions were answered to the patient's satisfaction.     Ray [name]

## 2014-03-31 NOTE — Transfer of Care (Signed)
Immediate Anesthesia Transfer of Care Note  Patient: Ray [name]  Procedure(s) Performed: Procedure(s) (LRB): UPPER ENDOSCOPIC ULTRASOUND (EUS) LINEAR (N/A) NEUROLYTIC CELIAC PLEXUS (N/A)  Patient Location: PACU  Anesthesia Type: MAC  Level of Consciousness: sedated, patient cooperative and responds to stimulation  Airway & Oxygen Therapy: Patient Spontanous Breathing and Patient connected to face mask oxgen  Post-op Assessment: Report given to PACU RN and Post -op Vital signs reviewed and stable  Post vital signs: Reviewed and stable  Complications: No apparent anesthesia complications

## 2014-03-31 NOTE — Op Note (Signed)
[facility] [place], [id number]   ENDOSCOPIC ULTRASOUND PROCEDURE REPORT  PATIENT: Ray [name], Ray [name]  MR#: [id number] BIRTHDATE: [birth year]  GENDER: male ENDOSCOPIST: Milus Banister, MD PROCEDURE DATE:  03/31/2014 PROCEDURE:   Upper EUS and Celiac plexus block ASA CLASS:      Class III INDICATIONS:   1.  pancreatic cancer related abdominal pain, CPN 2015 gave significant relief, here for repeat CPN because abd pain worsening again. MEDICATIONS: Monitored anesthesia care  DESCRIPTION OF PROCEDURE:   After the risks benefits and alternatives of the procedure were  explained, informed consent was obtained. The patient was then placed in the left, lateral, decubitus postion and IV sedation was administered. Throughout the procedure, the patients blood pressure, pulse and oxygen saturations were monitored continuously.  Under direct visualization, the Pentax Radial EUS [id number]  endoscope was introduced through the mouth  and advanced to the stomach.  Water was used as necessary to provide an acoustic interface.  Upon completion of the imaging, water was removed and the patient was sent to the recovery room in satisfactory condition.  Endoscopic findings (limited views with linear echoendoscope): Normal UGI tract  EUS findings: The celiac trunk takeoff was clearly identified and celiac plexus neurolysis was performed using 15cc of .25% bupivicane followed by 20cc of 98% dehyrated alcohol injected at one site.  Sterile saline was injected at the site prior to the neurolysis and after the neurolysis while needle being pulled out.  ENDOSCOPIC IMPRESSION: Pancreatic cancer related abdominal pain, s/p repeat celiac plexus neurolysis  RECOMMENDATIONS: Follow clinically.  _______________________________ eSignedMilus Banister, MD 03/31/2014 9:30 AM   CC: Julieanne Manson, MD

## 2014-03-31 NOTE — H&P (View-Only) (Signed)
Review of pertinent gastrointestinal problems: 1. Pancreas cancer 07/06/2013 ,CA 19-9- 2067.   CT abdomen/pelvis 07/09/2013 with interval development of amorphous soft tissue circumferentially encasing the celiac axis and tracking caudally down toward the SMA. Associated stable irregular mild distention of the main pancreatic duct in the body and tail of the pancreas with no discrete mass lesion seen. 2.2 x 1.4 cm portacaval lymph node in the hepatoduodenal ligament had increased in size from 1.3 x 1.1 cm previously. Other scattered small lymph nodes seen in the hepatoduodenal ligament.   Endoscopic ultrasound 07/15/2013 with a very vague masslike lesion in the region of the uncinate pancreas. Fine needle aspiration showed atypical cells consistent with adenocarcinoma.   PET scan 07/29/2013 with hypermetabolic soft tissue posterior to the pancreas with an adjacent hypermetabolic lymph node, no hypermetabolic pancreas mass, no evidence of distant metastatic disease  EUS guided CPN 08/2013 Dr. Ardis Hughs  HPI: This is a   very pleasant 59 year old man with pancreatic cancer, locally invasive, likely metastatic as well. I have not seen him in 9-10 months. Our last visit I performed a celiac plexus neural lysis and he tells me he got "a lot of relief" out of that. He is gradually having worsening abdominal pain. I reviewed his CT scan and there really isn't any huge difference in the encasement of the celiac trunk.  He is requiring multiple Percocets daily and this leaves him groggy and fairly constipated. He is hoping to get a repeat celiac plexus neural lysis    Past Medical History  Diagnosis Date  . Blood transfusion   . GERD (gastroesophageal reflux disease)   . Hyperlipidemia   . Hypertension   . H/O hiatal hernia   . Arthritis     in his back, hips, knees, R shoulder   . Colon polyps     TUBULAR ADENOMA  . Splenic vein thrombosis   . Liver cyst   . Hx of pancreatitis   . Gout   .  Pancreatic cancer     Past Surgical History  Procedure Laterality Date  . Hip surgery      x2 right hip- retained hardware ambulates with cane  . Shoulder surgery      right  . Hand surgery      left thumb tendon repair  . Femur hardware removal  2011    R femur- prothesis removed & replaced  . Posterior cervical fusion/foraminotomy N/A 04/20/2012    Procedure: POSTERIOR CERVICAL FUSION/FORAMINOTOMY LEVEL 1;  Surgeon: Ophelia Charter, MD;  Location: [facility];  Service: Neurosurgery;  Laterality: N/A;  C1/2 laminectomies with posterior screw fixation  . Colonoscopy w/ biopsies  04/15/2011  . Eus N/A 06/10/2013    Procedure: UPPER ENDOSCOPIC ULTRASOUND (EUS) LINEAR;  Surgeon: Milus Banister, MD;  Location: [facility];  Service: Endoscopy;  Laterality: N/A;  . Eus Bilateral 07/15/2013    Procedure: ESOPHAGEAL ENDOSCOPIC ULTRASOUND (EUS) RADIAL;  Surgeon: Milus Banister, MD;  Location: [facility];  Service: Endoscopy;  Laterality: Bilateral;  . Eus N/A 08/05/2013    Procedure: ESOPHAGEAL ENDOSCOPIC ULTRASOUND (EUS) RADIAL;  Surgeon: Milus Banister, MD;  Location: [facility];  Service: Endoscopy;  Laterality: N/A;  . Neurolytic celiac plexus N/A 08/05/2013    Procedure: NEUROLYTIC CELIAC PLEXUS;  Surgeon: Milus Banister, MD;  Location: [facility];  Service: Endoscopy;  Laterality: N/A;    Current Outpatient Prescriptions  Medication Sig Dispense Refill  . amLODipine (NORVASC) 10 MG tablet Take 1 tablet (10 mg  total) by mouth daily. 90 tablet 1  . baclofen (LIORESAL) 10 MG tablet Take 1 tablet (10 mg total) by mouth 3 (three) times daily with meals as needed (hiccups). 30 each 0  . dexamethasone (DECADRON) 4 MG tablet TAKE 2 TABLETS TWICE DAILY FOR 3 DAYS BEGINNING THE DAY AFTER CHEMO 24 tablet 1  . DULoxetine (CYMBALTA) 30 MG capsule TAKE 1 CAPSULE DAILY 90 capsule 0  . lidocaine-prilocaine (EMLA) cream Apply over port area 1-2 hours prior to treatment and cover with plastic wrap.   DO NOT RUB IN. 30 g PRN  . loperamide (IMODIUM) 2 MG capsule Take 1 capsule (2 mg total) by mouth as needed for diarrhea or loose stools. Maximum 8 tabs per day 30 capsule 2  . morphine (MS CONTIN) 30 MG 12 hr tablet Take 1 tablet (30 mg total) by mouth every 12 (twelve) hours. 60 tablet 0  . omeprazole (PRILOSEC) 40 MG capsule TAKE 1 CAPSULE DAILY 90 capsule 3  . oxyCODONE-acetaminophen (PERCOCET) 10-325 MG per tablet Take 1-2 tablets by mouth every 4 (four) hours as needed for pain 120 tablet 0  . Polyethyl Glycol-Propyl Glycol (SYSTANE) 0.4-0.3 % SOLN Apply 1 drop to eye 3 (three) times daily as needed (dry eyes).    . potassium chloride SA (K-DUR,KLOR-CON) 20 MEQ tablet Take 1 tablet (20 mEq total) by mouth daily. 90 tablet 2  . prochlorperazine (COMPAZINE) 10 MG tablet Take 1 tablet (10 mg total) by mouth every 6 (six) hours as needed for nausea or vomiting. 60 tablet 2  . rivaroxaban (XARELTO) 20 MG TABS tablet Take 1 tablet (20 mg total) by mouth every morning. 90 tablet 2  . sorbitol 70 % SOLN Take 15 ml - 30 ml by mouth 2 (two) times a days as needed for constipation 1000 mL 1  . VIAGRA 50 MG tablet TAKE 1 TABLET AS NEEDED 18 tablet 2   No current facility-administered medications for this visit.    Allergies as of 03/23/2014  . (No Known Allergies)    Family History  Problem Relation Age of Onset  . Colon cancer Neg Hx   . Esophageal cancer Neg Hx   . Stomach cancer Neg Hx   . Rectal cancer Neg Hx     History   Social History  . Marital Status: Married    Spouse Name: N/A  . Number of Children: 3  . Years of Education: N/A   Occupational History  . disabled [employer]   Social History Main Topics  . Smoking status: Former Smoker    Types: Cigars  . Smokeless tobacco: Never Used     Comment: one a day cigar x 6 months. Quit cigarettes -20 yrs ago.  . Alcohol Use: No     Comment: Quit heavy use 30 yrs ago.none now  . Drug Use: No  . Sexual Activity: Yes    Other Topics Concern  . Not on file   Social History Narrative   Married, wife Rolanda Lundborg w/cane      Physical Exam: BP 105/70 mmHg  Pulse 92  Ht 6' (1.829 m)  Wt 170 lb (77.111 kg)  BMI 23.05 kg/m2  SpO2 98% Constitutional: generally well-appearing Psychiatric: alert and oriented x3 Abdomen: soft, nontender, nondistended, no obvious ascites, no peritoneal signs, normal bowel sounds     Assessment and plan: 59 y.o. male with locally invasive, likely metastatic pancreatic cancer  He had great response after celiac plexus neural lysis 9 months  ago and I'm happy to repeat that for him now. I explained he might not get as dramatic response but I suspect he will at least notice some improvement in his abdominal pains. We'll get this scheduled to be done next week. He will hold his Xarelto for 1 day prior to the procedure.

## 2014-04-01 ENCOUNTER — Encounter (HOSPITAL_COMMUNITY): Payer: Self-pay | Admitting: Gastroenterology

## 2014-04-04 ENCOUNTER — Telehealth: Payer: Self-pay | Admitting: Oncology

## 2014-04-04 ENCOUNTER — Other Ambulatory Visit (HOSPITAL_BASED_OUTPATIENT_CLINIC_OR_DEPARTMENT_OTHER): Payer: Medicare Other

## 2014-04-04 ENCOUNTER — Ambulatory Visit: Payer: Medicare Other

## 2014-04-04 ENCOUNTER — Encounter (HOSPITAL_COMMUNITY): Payer: Self-pay | Admitting: Emergency Medicine

## 2014-04-04 ENCOUNTER — Ambulatory Visit (HOSPITAL_BASED_OUTPATIENT_CLINIC_OR_DEPARTMENT_OTHER): Payer: Medicare Other | Admitting: Nurse Practitioner

## 2014-04-04 ENCOUNTER — Inpatient Hospital Stay (HOSPITAL_COMMUNITY)
Admission: EM | Admit: 2014-04-04 | Discharge: 2014-04-08 | DRG: 435 | Disposition: A | Discharge status: Home / Self Care | Payer: Medicare Other | Attending: Internal Medicine | Admitting: Internal Medicine

## 2014-04-04 VITALS — BP 135/89 | HR 100 | Temp 98.9°F | Resp 18 | Ht 72.0 in | Wt 167.8 lb

## 2014-04-04 DIAGNOSIS — C251 Malignant neoplasm of body of pancreas: Secondary | ICD-10-CM

## 2014-04-04 DIAGNOSIS — E785 Hyperlipidemia, unspecified: Secondary | ICD-10-CM | POA: Diagnosis present

## 2014-04-04 DIAGNOSIS — C252 Malignant neoplasm of tail of pancreas: Secondary | ICD-10-CM

## 2014-04-04 DIAGNOSIS — D735 Infarction of spleen: Secondary | ICD-10-CM | POA: Diagnosis present

## 2014-04-04 DIAGNOSIS — K219 Gastro-esophageal reflux disease without esophagitis: Secondary | ICD-10-CM | POA: Diagnosis present

## 2014-04-04 DIAGNOSIS — Z515 Encounter for palliative care: Secondary | ICD-10-CM

## 2014-04-04 DIAGNOSIS — F17290 Nicotine dependence, other tobacco product, uncomplicated: Secondary | ICD-10-CM | POA: Diagnosis present

## 2014-04-04 DIAGNOSIS — Z9221 Personal history of antineoplastic chemotherapy: Secondary | ICD-10-CM

## 2014-04-04 DIAGNOSIS — C259 Malignant neoplasm of pancreas, unspecified: Secondary | ICD-10-CM

## 2014-04-04 DIAGNOSIS — Z7901 Long term (current) use of anticoagulants: Secondary | ICD-10-CM

## 2014-04-04 DIAGNOSIS — I10 Essential (primary) hypertension: Secondary | ICD-10-CM | POA: Diagnosis present

## 2014-04-04 DIAGNOSIS — C787 Secondary malignant neoplasm of liver and intrahepatic bile duct: Secondary | ICD-10-CM

## 2014-04-04 DIAGNOSIS — Z6822 Body mass index (BMI) 22.0-22.9, adult: Secondary | ICD-10-CM

## 2014-04-04 DIAGNOSIS — G893 Neoplasm related pain (acute) (chronic): Secondary | ICD-10-CM | POA: Diagnosis present

## 2014-04-04 DIAGNOSIS — E43 Unspecified severe protein-calorie malnutrition: Secondary | ICD-10-CM | POA: Diagnosis present

## 2014-04-04 DIAGNOSIS — C250 Malignant neoplasm of head of pancreas: Secondary | ICD-10-CM | POA: Diagnosis present

## 2014-04-04 DIAGNOSIS — I82890 Acute embolism and thrombosis of other specified veins: Secondary | ICD-10-CM | POA: Diagnosis present

## 2014-04-04 DIAGNOSIS — R112 Nausea with vomiting, unspecified: Secondary | ICD-10-CM

## 2014-04-04 DIAGNOSIS — Z79891 Long term (current) use of opiate analgesic: Secondary | ICD-10-CM

## 2014-04-04 DIAGNOSIS — M1990 Unspecified osteoarthritis, unspecified site: Secondary | ICD-10-CM | POA: Diagnosis present

## 2014-04-04 DIAGNOSIS — R109 Unspecified abdominal pain: Secondary | ICD-10-CM

## 2014-04-04 DIAGNOSIS — I8290 Acute embolism and thrombosis of unspecified vein: Secondary | ICD-10-CM

## 2014-04-04 LAB — COMPREHENSIVE METABOLIC PANEL (CC13)
ALK PHOS: 117 U/L (ref 40–150)
ALT: <6 U/L (ref 0–55)
AST: 10 U/L (ref 5–34)
Albumin: 3.8 g/dL (ref 3.5–5.0)
Anion Gap: 12 mEq/L — ABNORMAL HIGH (ref 3–11)
BUN: 10.8 mg/dL (ref 7.0–26.0)
CALCIUM: 9.2 mg/dL (ref 8.4–10.4)
CO2: 24 meq/L (ref 22–29)
CREATININE: 1 mg/dL (ref 0.7–1.3)
Chloride: 104 mEq/L (ref 98–109)
Glucose: 111 mg/dl (ref 70–140)
Potassium: 4.2 mEq/L (ref 3.5–5.1)
SODIUM: 140 meq/L (ref 136–145)
Total Bilirubin: 0.4 mg/dL (ref 0.20–1.20)
Total Protein: 6.4 g/dL (ref 6.4–8.3)

## 2014-04-04 LAB — CBC WITH DIFFERENTIAL/PLATELET
BASO%: 0.1 % (ref 0.0–2.0)
Basophils Absolute: 0 10*3/uL (ref 0.0–0.1)
EOS ABS: 0.1 10*3/uL (ref 0.0–0.5)
EOS%: 0.9 % (ref 0.0–7.0)
HCT: 40.5 % (ref 38.4–49.9)
HGB: 12.9 g/dL — ABNORMAL LOW (ref 13.0–17.1)
LYMPH%: 24.6 % (ref 14.0–49.0)
MCH: 27.5 pg (ref 27.2–33.4)
MCHC: 31.8 g/dL — ABNORMAL LOW (ref 32.0–36.0)
MCV: 86.6 fL (ref 79.3–98.0)
MONO#: 0.8 10*3/uL (ref 0.1–0.9)
MONO%: 9.2 % (ref 0.0–14.0)
NEUT%: 65.2 % (ref 39.0–75.0)
NEUTROS ABS: 5.6 10*3/uL (ref 1.5–6.5)
Platelets: 156 10*3/uL (ref 140–400)
RBC: 4.68 10*6/uL (ref 4.20–5.82)
RDW: 20.4 % — ABNORMAL HIGH (ref 11.0–14.6)
WBC: 8.6 10*3/uL (ref 4.0–10.3)
lymph#: 2.1 10*3/uL (ref 0.9–3.3)

## 2014-04-04 LAB — CANCER ANTIGEN 19-9: CA 19-9: 1205.9 U/mL — ABNORMAL HIGH (ref <35.0)

## 2014-04-04 MED ORDER — HYDROMORPHONE HCL 4 MG PO TABS: 4.0000 mg | ORAL_TABLET | ORAL | Status: DC | PRN

## 2014-04-04 NOTE — ED Notes (Signed)
Pt  Has hx of pancreatic cancer and is having LUQ abdominal pain that radiates into his back. Pt normally takes oxycodone for pain but it hasn't been working. Pt saw oncologist today and they prescribed dilaudid and that has not helped either. Pt called after hours nurse and nurse told him to come to ED. Pt c/o nausea, no vomiting. Pt had chemo two weeks ago.

## 2014-04-04 NOTE — Progress Notes (Addendum)
[place] OFFICE PROGRESS NOTE   Diagnosis:  Pancreas cancer  INTERVAL HISTORY:   Ray [name] returns for follow-up. He was last treated with FOLFIRI on 03/07/2014. He was unable to come in for treatment on 03/21/2014 due to the weather. On 03/31/2014 he underwent celiac plexus neurolysis for increased abdominal pain. He notes no improvement in the left-sided abdominal pain. Over the past several days the pain has been radiating to his back. He reports minimal relief with Percocet. He is taking 2 tablets every 4 hours. He continues MS Contin 30 mg every 12 hours. He denies nausea/vomiting. He has noted mild constipation since increasing the pain medication.  Objective:  Vital signs in last 24 hours:  Blood pressure 135/89, pulse 100, temperature 98.9 F (37.2 C), temperature source Oral, resp. rate 18, height 6' (1.829 m), weight 167 lb 12.8 oz (76.114 kg), SpO2 99 %.    HEENT: No thrush or ulcers. Resp: Lungs clear bilaterally. Cardio: Regular rate and rhythm. GI: Abdomen is soft and nontender. No mass. No hepatomegaly. Vascular: No leg edema. Port-A-Cath without erythema.   Lab Results:  Lab Results  Component Value Date   WBC 8.6 04/04/2014   HGB 12.9* 04/04/2014   HCT 40.5 04/04/2014   MCV 86.6 04/04/2014   PLT 156 04/04/2014   NEUTROABS 5.6 04/04/2014    Imaging:  No results found.  Medications: I have reviewed the patient's current medications.  Assessment/Plan: 1. Pancreas cancer.  07/06/2013 ,CA 19-9- 2067.   CT abdomen/pelvis 07/09/2013 with interval development of amorphous soft tissue circumferentially encasing the celiac axis and tracking caudally down toward the SMA. Associated stable irregular mild distention of the main pancreatic duct in the body and tail of the pancreas with no discrete mass lesion seen. 2.2 x 1.4 cm portacaval lymph node in the hepatoduodenal ligament had increased in size from 1.3 x 1.1 cm previously. Other  scattered small lymph nodes seen in the hepatoduodenal ligament.   Endoscopic ultrasound 07/15/2013 with a very vague masslike lesion in the region of the uncinate pancreas. Fine needle aspiration showed atypical cells consistent with adenocarcinoma.   PET scan 07/29/2013 with hypermetabolic soft tissue posterior to the pancreas with an adjacent hypermetabolic lymph node, no hypermetabolic pancreas mass, no evidence of distant metastatic disease   Cycle 1 FOLFIRINOX 08/02/2013.   Cycle 2 FOLFIRINOX 08/16/2013.   Cycle 3 FOLFIRINOX 09/06/2013.   Cycle 4 FOLFIRINOX 09/21/2013.   CA 19-9 improved on 10/04/2013.   Cycle 5 FOLFIRINOX 10/04/2013.   Cycle 6 FOLFIRINOX 10/18/2013   Restaging CT 10/22/2013 with no change in the soft tissue surrounding the pancreas/celiac axis, scattered indeterminate low-attenuation liver lesions are more conspicuous.  Cycle 7 FOLFIRINOX 11/08/2013  Cycle 8 FOLFIRINOX 11/22/2013  Cycle 9 FOLFIRINOX 12/06/2013  Cycle 10 FOLFIRI (oxaliplatin eliminated) 12/20/2013.  Cycle 11 FOLFIRI 01/03/2014  Restaging CT 01/12/2014-no change in the soft tissue posterior to the pancreas or an 8 mm hepatic metastasis  Cycle 12 FOLFIRI 01/17/2014.  Cycle 13 FOLFIRI 02/07/2014  Cycle 14 FOLFIRI 02/21/2014  Cycle 15 FOLFIRI 03/07/2014 2. Abdominal pain secondary to #1.  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  Status post celiac block 03/31/2014. 3. Hypertension. 4. History of gout. 5. Nausea and vomiting following FOLFIRINOX chemotherapy.  Aloxi and Emend added with cycle 2.   Decadron added with cycle 3. 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 7. Hospitalization 08/24/2013 through 08/27/2013 with poorly controlled nausea/vomiting. Diarrhea during the hospitalization. 8. Skin hyperpigmentation and dryness secondary to 5-fluorouracil-he will  continue a moisturizer cream.     Disposition: Ray [name] has completed 15 cycles of FOLFIRI.  He is having increased abdominal pain. We are holding today's treatment and referring him for restaging CT scans. We will see him back on 04/07/2014 to review the results. He will try Dilaudid 4 mg 1-2 tablets every 4 hours as needed for the pain.  Patient seen with [name].  Ned Card ANP/GNP-BC   04/04/2014  9:33 AM   This was a shared visit with Ned Card. Ray [name] was interviewed and examined.  We will follow-up on the CA 19-9 from today and schedule a restaging CT. I am concerned the increased pain represents disease progression. He will return for an office visit on 04/07/2014. We prescribed Dilaudid for breakthrough pain.  Julieanne Manson, M.D.

## 2014-04-04 NOTE — Telephone Encounter (Signed)
gv adnprinted appt sched adna vs for pt for March.....gv barium

## 2014-04-05 ENCOUNTER — Encounter (HOSPITAL_COMMUNITY): Payer: Self-pay

## 2014-04-05 ENCOUNTER — Emergency Department (HOSPITAL_COMMUNITY): Payer: Medicare Other

## 2014-04-05 ENCOUNTER — Telehealth: Payer: Self-pay | Admitting: *Deleted

## 2014-04-05 DIAGNOSIS — C252 Malignant neoplasm of tail of pancreas: Secondary | ICD-10-CM | POA: Diagnosis not present

## 2014-04-05 DIAGNOSIS — C787 Secondary malignant neoplasm of liver and intrahepatic bile duct: Secondary | ICD-10-CM | POA: Diagnosis not present

## 2014-04-05 DIAGNOSIS — K219 Gastro-esophageal reflux disease without esophagitis: Secondary | ICD-10-CM

## 2014-04-05 DIAGNOSIS — I82890 Acute embolism and thrombosis of other specified veins: Secondary | ICD-10-CM

## 2014-04-05 DIAGNOSIS — G893 Neoplasm related pain (acute) (chronic): Secondary | ICD-10-CM | POA: Diagnosis not present

## 2014-04-05 DIAGNOSIS — R110 Nausea: Secondary | ICD-10-CM

## 2014-04-05 DIAGNOSIS — I10 Essential (primary) hypertension: Secondary | ICD-10-CM | POA: Diagnosis present

## 2014-04-05 DIAGNOSIS — R109 Unspecified abdominal pain: Secondary | ICD-10-CM | POA: Diagnosis present

## 2014-04-05 DIAGNOSIS — Z515 Encounter for palliative care: Secondary | ICD-10-CM | POA: Diagnosis not present

## 2014-04-05 DIAGNOSIS — Z7901 Long term (current) use of anticoagulants: Secondary | ICD-10-CM | POA: Diagnosis not present

## 2014-04-05 DIAGNOSIS — Z79891 Long term (current) use of opiate analgesic: Secondary | ICD-10-CM | POA: Diagnosis not present

## 2014-04-05 DIAGNOSIS — R1013 Epigastric pain: Secondary | ICD-10-CM

## 2014-04-05 DIAGNOSIS — F17290 Nicotine dependence, other tobacco product, uncomplicated: Secondary | ICD-10-CM | POA: Diagnosis present

## 2014-04-05 DIAGNOSIS — Z9221 Personal history of antineoplastic chemotherapy: Secondary | ICD-10-CM | POA: Diagnosis not present

## 2014-04-05 DIAGNOSIS — C259 Malignant neoplasm of pancreas, unspecified: Secondary | ICD-10-CM | POA: Diagnosis not present

## 2014-04-05 DIAGNOSIS — R1012 Left upper quadrant pain: Secondary | ICD-10-CM

## 2014-04-05 DIAGNOSIS — D735 Infarction of spleen: Secondary | ICD-10-CM | POA: Diagnosis present

## 2014-04-05 DIAGNOSIS — E43 Unspecified severe protein-calorie malnutrition: Secondary | ICD-10-CM

## 2014-04-05 DIAGNOSIS — M1990 Unspecified osteoarthritis, unspecified site: Secondary | ICD-10-CM | POA: Diagnosis present

## 2014-04-05 DIAGNOSIS — C251 Malignant neoplasm of body of pancreas: Secondary | ICD-10-CM

## 2014-04-05 DIAGNOSIS — E785 Hyperlipidemia, unspecified: Secondary | ICD-10-CM | POA: Diagnosis present

## 2014-04-05 DIAGNOSIS — Z6822 Body mass index (BMI) 22.0-22.9, adult: Secondary | ICD-10-CM | POA: Diagnosis not present

## 2014-04-05 LAB — CBC WITH DIFFERENTIAL/PLATELET
BASOS ABS: 0 10*3/uL (ref 0.0–0.1)
BASOS PCT: 0 % (ref 0–1)
Basophils Absolute: 0 10*3/uL (ref 0.0–0.1)
Basophils Relative: 0 % (ref 0–1)
EOS PCT: 0 % (ref 0–5)
EOS PCT: 1 % (ref 0–5)
Eosinophils Absolute: 0 10*3/uL (ref 0.0–0.7)
Eosinophils Absolute: 0.1 10*3/uL (ref 0.0–0.7)
HEMATOCRIT: 36 % — AB (ref 39.0–52.0)
HEMATOCRIT: 38.8 % — AB (ref 39.0–52.0)
HEMOGLOBIN: 12.3 g/dL — AB (ref 13.0–17.0)
Hemoglobin: 11.6 g/dL — ABNORMAL LOW (ref 13.0–17.0)
LYMPHS ABS: 1.7 10*3/uL (ref 0.7–4.0)
LYMPHS PCT: 18 % (ref 12–46)
Lymphocytes Relative: 13 % (ref 12–46)
Lymphs Abs: 1.1 10*3/uL (ref 0.7–4.0)
MCH: 27.3 pg (ref 26.0–34.0)
MCH: 27.6 pg (ref 26.0–34.0)
MCHC: 31.7 g/dL (ref 30.0–36.0)
MCHC: 32.2 g/dL (ref 30.0–36.0)
MCV: 85.7 fL (ref 78.0–100.0)
MCV: 86.2 fL (ref 78.0–100.0)
MONO ABS: 0.7 10*3/uL (ref 0.1–1.0)
MONOS PCT: 5 % (ref 3–12)
Monocytes Absolute: 0.4 10*3/uL (ref 0.1–1.0)
Monocytes Relative: 8 % (ref 3–12)
NEUTROS ABS: 6.6 10*3/uL (ref 1.7–7.7)
Neutro Abs: 6.6 10*3/uL (ref 1.7–7.7)
Neutrophils Relative %: 82 % — ABNORMAL HIGH (ref 43–77)
Neutrophils Relative %: 73 % (ref 43–77)
PLATELETS: 154 10*3/uL (ref 150–400)
Platelets: 171 10*3/uL (ref 150–400)
RBC: 4.5 MIL/uL (ref 4.22–5.81)
RBC: 4.2 MIL/uL — AB (ref 4.22–5.81)
RDW: 19.7 % — ABNORMAL HIGH (ref 11.5–15.5)
RDW: 19.5 % — ABNORMAL HIGH (ref 11.5–15.5)
WBC: 8.1 10*3/uL (ref 4.0–10.5)
WBC: 9 10*3/uL (ref 4.0–10.5)

## 2014-04-05 LAB — COMPREHENSIVE METABOLIC PANEL
ALK PHOS: 94 U/L (ref 39–117)
ALT: 8 U/L (ref 0–53)
ALT: 7 U/L (ref 0–53)
ANION GAP: 8 (ref 5–15)
ANION GAP: 9 (ref 5–15)
AST: 13 U/L (ref 0–37)
AST: 10 U/L (ref 0–37)
Albumin: 4.5 g/dL (ref 3.5–5.2)
Albumin: 3.9 g/dL (ref 3.5–5.2)
Alkaline Phosphatase: 107 U/L (ref 39–117)
BILIRUBIN TOTAL: 0.6 mg/dL (ref 0.3–1.2)
BILIRUBIN TOTAL: 0.7 mg/dL (ref 0.3–1.2)
BUN: 10 mg/dL (ref 6–23)
BUN: 9 mg/dL (ref 6–23)
CHLORIDE: 100 mmol/L (ref 96–112)
CO2: 28 mmol/L (ref 19–32)
CO2: 29 mmol/L (ref 19–32)
CREATININE: 0.75 mg/dL (ref 0.50–1.35)
Calcium: 9.4 mg/dL (ref 8.4–10.5)
Calcium: 8.8 mg/dL (ref 8.4–10.5)
Chloride: 100 mmol/L (ref 96–112)
Creatinine, Ser: 0.63 mg/dL (ref 0.50–1.35)
GFR calc non Af Amer: >90 mL/min (ref >90)
Glucose, Bld: 114 mg/dL — ABNORMAL HIGH (ref 70–99)
Glucose, Bld: 97 mg/dL (ref 70–99)
POTASSIUM: 3.9 mmol/L (ref 3.5–5.1)
Potassium: 3.9 mmol/L (ref 3.5–5.1)
SODIUM: 137 mmol/L (ref 135–145)
Sodium: 137 mmol/L (ref 135–145)
TOTAL PROTEIN: 6.9 g/dL (ref 6.0–8.3)
Total Protein: 6.2 g/dL (ref 6.0–8.3)

## 2014-04-05 LAB — I-STAT CHEM 8, ED
BUN: 8 mg/dL (ref 6–23)
CALCIUM ION: 1.13 mmol/L (ref 1.12–1.23)
CHLORIDE: 97 mmol/L (ref 96–112)
Creatinine, Ser: 0.7 mg/dL (ref 0.50–1.35)
GLUCOSE: 109 mg/dL — AB (ref 70–99)
HCT: 44 % (ref 39.0–52.0)
Hemoglobin: 15 g/dL (ref 13.0–17.0)
Potassium: 3.9 mmol/L (ref 3.5–5.1)
Sodium: 138 mmol/L (ref 135–145)
TCO2: 25 mmol/L (ref 0–100)

## 2014-04-05 LAB — LIPASE, BLOOD: LIPASE: 15 U/L (ref 11–59)

## 2014-04-05 MED ORDER — RIVAROXABAN 20 MG PO TABS
20.0000 mg | ORAL_TABLET | Freq: Every day | ORAL | Status: DC
  Administered 2014-04-05 – 2014-04-08 (×4): 20 mg via ORAL
  Filled 2014-04-05 (×6): qty 1

## 2014-04-05 MED ORDER — ACETAMINOPHEN 650 MG RE SUPP: 650.0000 mg | Freq: Four times a day (QID) | RECTAL | Status: DC | PRN

## 2014-04-05 MED ORDER — ONDANSETRON HCL 4 MG/2ML IJ SOLN: 4.0000 mg | Freq: Four times a day (QID) | INTRAMUSCULAR | Status: DC | PRN

## 2014-04-05 MED ORDER — SODIUM CHLORIDE 0.9 % IV SOLN
INTRAVENOUS | Status: AC
  Administered 2014-04-05 (×3): via INTRAVENOUS

## 2014-04-05 MED ORDER — IOHEXOL 300 MG/ML  SOLN
100.0000 mL | Freq: Once | INTRAMUSCULAR | Status: AC | PRN
  Administered 2014-04-05: 100 mL via INTRAVENOUS

## 2014-04-05 MED ORDER — HYDROMORPHONE HCL 2 MG/ML IJ SOLN
2.0000 mg | Freq: Once | INTRAMUSCULAR | Status: AC
  Administered 2014-04-05: 2 mg via INTRAVENOUS
  Filled 2014-04-05: qty 1

## 2014-04-05 MED ORDER — SODIUM CHLORIDE 0.9 % IV BOLUS (SEPSIS)
1000.0000 mL | Freq: Once | INTRAVENOUS | Status: AC
  Administered 2014-04-05: 1000 mL via INTRAVENOUS

## 2014-04-05 MED ORDER — PROCHLORPERAZINE MALEATE 10 MG PO TABS: 10.0000 mg | ORAL_TABLET | Freq: Four times a day (QID) | ORAL | Status: DC | PRN

## 2014-04-05 MED ORDER — HYDROMORPHONE HCL 1 MG/ML IJ SOLN
1.0000 mg | Freq: Once | INTRAMUSCULAR | Status: AC
  Administered 2014-04-05: 1 mg via INTRAVENOUS
  Filled 2014-04-05: qty 1

## 2014-04-05 MED ORDER — LIDOCAINE-PRILOCAINE 2.5-2.5 % EX CREA: TOPICAL_CREAM | Freq: Once | CUTANEOUS | Status: DC

## 2014-04-05 MED ORDER — ACETAMINOPHEN 325 MG PO TABS: 650.0000 mg | ORAL_TABLET | Freq: Four times a day (QID) | ORAL | Status: DC | PRN

## 2014-04-05 MED ORDER — ONDANSETRON HCL 4 MG PO TABS: 4.0000 mg | ORAL_TABLET | Freq: Four times a day (QID) | ORAL | Status: DC | PRN

## 2014-04-05 MED ORDER — MORPHINE SULFATE ER 30 MG PO TBCR: 30.0000 mg | EXTENDED_RELEASE_TABLET | Freq: Two times a day (BID) | ORAL | Status: DC

## 2014-04-05 MED ORDER — PANTOPRAZOLE SODIUM 40 MG PO TBEC
40.0000 mg | DELAYED_RELEASE_TABLET | Freq: Every day | ORAL | Status: DC
  Administered 2014-04-05 – 2014-04-08 (×4): 40 mg via ORAL
  Filled 2014-04-05 (×5): qty 1

## 2014-04-05 MED ORDER — SORBITOL 70 % SOLN: 30.0000 mL | Freq: Every day | Status: DC | PRN

## 2014-04-05 MED ORDER — HYDROMORPHONE HCL 1 MG/ML IJ SOLN
1.0000 mg | INTRAMUSCULAR | Status: DC | PRN
Start: 2014-04-05 — End: 2014-04-05
  Administered 2014-04-05 (×2): 1 mg via INTRAVENOUS
  Filled 2014-04-05 (×2): qty 1

## 2014-04-05 MED ORDER — POTASSIUM CHLORIDE CRYS ER 20 MEQ PO TBCR
20.0000 meq | EXTENDED_RELEASE_TABLET | Freq: Every day | ORAL | Status: DC
  Administered 2014-04-05 – 2014-04-08 (×4): 20 meq via ORAL
  Filled 2014-04-05 (×5): qty 1

## 2014-04-05 MED ORDER — HYDRALAZINE HCL 20 MG/ML IJ SOLN
10.0000 mg | INTRAMUSCULAR | Status: DC | PRN
Start: 2014-04-05 — End: 2014-04-08

## 2014-04-05 MED ORDER — HYDROMORPHONE HCL 2 MG/ML IJ SOLN
2.0000 mg | INTRAMUSCULAR | Status: DC | PRN
  Administered 2014-04-05 (×2): 4 mg via INTRAVENOUS
  Administered 2014-04-05: 2 mg via INTRAVENOUS
  Administered 2014-04-05 – 2014-04-06 (×6): 4 mg via INTRAVENOUS
  Filled 2014-04-05: qty 1
  Filled 2014-04-05 (×8): qty 2

## 2014-04-05 MED ORDER — AMLODIPINE BESYLATE 10 MG PO TABS
10.0000 mg | ORAL_TABLET | Freq: Every day | ORAL | Status: DC
Start: 2014-04-05 — End: 2014-04-08
  Administered 2014-04-05 – 2014-04-08 (×4): 10 mg via ORAL
  Filled 2014-04-05 (×4): qty 1

## 2014-04-05 MED ORDER — MORPHINE SULFATE ER 30 MG PO TBCR
60.0000 mg | EXTENDED_RELEASE_TABLET | Freq: Two times a day (BID) | ORAL | Status: DC
  Administered 2014-04-05 – 2014-04-06 (×3): 60 mg via ORAL
  Filled 2014-04-05 (×3): qty 2

## 2014-04-05 MED ORDER — ONDANSETRON HCL 4 MG/2ML IJ SOLN
4.0000 mg | Freq: Once | INTRAMUSCULAR | Status: AC
  Administered 2014-04-05: 4 mg via INTRAVENOUS
  Filled 2014-04-05: qty 2

## 2014-04-05 MED ORDER — IOHEXOL 300 MG/ML  SOLN
50.0000 mL | Freq: Once | INTRAMUSCULAR | Status: AC | PRN
  Administered 2014-04-05: 50 mL via ORAL

## 2014-04-05 MED ORDER — DULOXETINE HCL 30 MG PO CPEP
30.0000 mg | ORAL_CAPSULE | Freq: Every day | ORAL | Status: DC
  Administered 2014-04-05 – 2014-04-08 (×4): 30 mg via ORAL
  Filled 2014-04-05 (×4): qty 1

## 2014-04-05 MED ORDER — HYDROMORPHONE HCL 2 MG/ML IJ SOLN
2.0000 mg | INTRAMUSCULAR | Status: DC | PRN
  Administered 2014-04-05 (×2): 2 mg via INTRAVENOUS
  Filled 2014-04-05 (×2): qty 1

## 2014-04-05 NOTE — H&P (Signed)
[facility] History and Physical  Ray [name] JJH:[id number] DOB: 06/12/1955 DOA: 04/04/2014  Referring physician: ER physician. PCP: Chrisandra Netters, MD patient's primary care physician is [facility], [facility] but patient wants to be in [facility].  Chief Complaint: Abdominal pain.  HPI: Ray [name] is a 59 y.o. male with known history of pancreatic cancer on chemotherapy, last chemotherapy was last month, had cervical plexus neurolysis last week by Dr. Ardis Hughs presents to the ER because of worsening pain over the last 5 days. Patient's pain is mostly in the epigastric and left upper quadrant. Denies any nausea vomiting or diarrhea and has been able to eat his regular food. Patient rates the pain as 8-9 out of 10. Patient had a CT abdomen and pelvis done in the ER which shows the following features -   Abnormal soft tissue density tracking along the posterior aspect of the pancreatic body, encasing the celiac trunk. This is somewhat more diffuse than on the prior study, and reflects the patient's known pancreatic cancer. Mildly worsened soft tissue inflammation extending along the left diaphragmatic crus, with a few scattered foci of soft tissue air at the medial left upper quadrant. On correlation with recent history, this reflects the patient's recent celiac ganglion block procedure. Mildly worsened prominence of the intrahepatic biliary ducts and common bile duct noted, concerning for mildly worsened obstruction.  Patient is admitted for further pain management.     Review of Systems: As presented in the history of presenting illness, rest negative.  Past Medical History  Diagnosis Date  . Blood transfusion   . GERD (gastroesophageal reflux disease)   . Hyperlipidemia   . Hypertension   . H/O hiatal hernia   . Arthritis     in his back, hips, knees, R shoulder   . Colon polyps     TUBULAR ADENOMA  . Splenic vein thrombosis   . Liver cyst    . Hx of pancreatitis   . Gout   . Pancreatic cancer    Past Surgical History  Procedure Laterality Date  . Hip surgery      x2 right hip- retained hardware ambulates with cane  . Shoulder surgery      right  . Hand surgery      left thumb tendon repair  . Femur hardware removal  2011    R femur- prothesis removed & replaced  . Posterior cervical fusion/foraminotomy N/A 04/20/2012    Procedure: POSTERIOR CERVICAL FUSION/FORAMINOTOMY LEVEL 1;  Surgeon: Ophelia Charter, MD;  Location: [facility];  Service: Neurosurgery;  Laterality: N/A;  C1/2 laminectomies with posterior screw fixation  . Colonoscopy w/ biopsies  04/15/2011  . Eus N/A 06/10/2013    Procedure: UPPER ENDOSCOPIC ULTRASOUND (EUS) LINEAR;  Surgeon: Milus Banister, MD;  Location: [facility];  Service: Endoscopy;  Laterality: N/A;  . Eus Bilateral 07/15/2013    Procedure: ESOPHAGEAL ENDOSCOPIC ULTRASOUND (EUS) RADIAL;  Surgeon: Milus Banister, MD;  Location: [facility];  Service: Endoscopy;  Laterality: Bilateral;  . Eus N/A 08/05/2013    Procedure: ESOPHAGEAL ENDOSCOPIC ULTRASOUND (EUS) RADIAL;  Surgeon: Milus Banister, MD;  Location: [facility];  Service: Endoscopy;  Laterality: N/A;  . Neurolytic celiac plexus N/A 08/05/2013    Procedure: NEUROLYTIC CELIAC PLEXUS;  Surgeon: Milus Banister, MD;  Location: [facility];  Service: Endoscopy;  Laterality: N/A;  . Eus N/A 03/31/2014    Procedure: UPPER ENDOSCOPIC ULTRASOUND (EUS) LINEAR;  Surgeon: Milus Banister, MD;  Location: [facility];  Service: Endoscopy;  Laterality: N/A;   celiac plexus neurolysis  . Neurolytic celiac plexus N/A 03/31/2014    Procedure: NEUROLYTIC CELIAC PLEXUS;  Surgeon: Milus Banister, MD;  Location: [facility];  Service: Endoscopy;  Laterality: N/A;   Social History:  reports that he has been smoking Cigars.  He has never used smokeless tobacco. He reports that he does not drink alcohol or use illicit drugs. Where does patient live home. Can  patient participate in ADLs? Yes.  No Known Allergies  Family History:  Family History  Problem Relation Age of Onset  . Colon cancer Neg Hx   . Esophageal cancer Neg Hx   . Stomach cancer Neg Hx   . Rectal cancer Neg Hx       Prior to Admission medications   Medication Sig Start Date End Date Taking? Authorizing Provider  amLODipine (NORVASC) 10 MG tablet Take 1 tablet (10 mg total) by mouth daily. 11/23/13  Yes Leeanne Rio, MD  DULoxetine (CYMBALTA) 30 MG capsule TAKE 1 CAPSULE DAILY 02/02/14  Yes Leeanne Rio, MD  HYDROmorphone (DILAUDID) 4 MG tablet Take 1-2 tablets (4-8 mg total) by mouth every 4 (four) hours as needed for severe pain. 04/04/14  Yes Owens Shark, NP  lidocaine-prilocaine (EMLA) cream Apply over port area 1-2 hours prior to treatment and cover with plastic wrap.  DO NOT RUB IN. 01/17/14  Yes Ladell Pier, MD  morphine (MS CONTIN) 30 MG 12 hr tablet Take 1 tablet (30 mg total) by mouth every 12 (twelve) hours. 03/07/14  Yes Owens Shark, NP  omeprazole (PRILOSEC) 40 MG capsule TAKE 1 CAPSULE DAILY 12/21/13  Yes Leeanne Rio, MD  oxyCODONE-acetaminophen (PERCOCET) 10-325 MG per tablet Take 1-2 tablets by mouth every 4 (four) hours as needed for pain 03/07/14  Yes Owens Shark, NP  potassium chloride SA (K-DUR,KLOR-CON) 20 MEQ tablet Take 1 tablet (20 mEq total) by mouth daily. 11/22/13  Yes Ladell Pier, MD  prochlorperazine (COMPAZINE) 10 MG tablet Take 1 tablet (10 mg total) by mouth every 6 (six) hours as needed for nausea or vomiting. 01/03/14  Yes Owens Shark, NP  rivaroxaban (XARELTO) 20 MG TABS tablet Take 1 tablet (20 mg total) by mouth every morning. 08/16/13  Yes Ladell Pier, MD  dexamethasone (DECADRON) 4 MG tablet TAKE 2 TABLETS TWICE DAILY FOR 3 DAYS BEGINNING THE DAY AFTER CHEMO 03/21/14   Owens Shark, NP  loperamide (IMODIUM) 2 MG capsule Take 1 capsule (2 mg total) by mouth as needed for diarrhea or loose stools. Maximum 8  tabs per day 01/03/14   Owens Shark, NP  Polyethyl Glycol-Propyl Glycol (SYSTANE) 0.4-0.3 % SOLN Apply 1 drop to eye 3 (three) times daily as needed (dry eyes).    Historical Provider, MD  sorbitol 70 % SOLN Take 15 ml - 30 ml by mouth 2 (two) times a days as needed for constipation 07/30/13   Ladell Pier, MD  VIAGRA 50 MG tablet TAKE 1 TABLET AS NEEDED 10/21/13   Leeanne Rio, MD    Physical Exam: Filed Vitals:   04/05/14 0114 04/05/14 0154 04/05/14 0236 04/05/14 0334  BP: 148/94 144/90 149/92 150/87  Pulse: 75 82 71 63  Temp:    98.4 F (36.9 C)  TempSrc:    Oral  Resp: 18 18 18 18   Height:    6' (1.829 m)  Weight:    74.4 kg (164  lb 0.4 oz)  SpO2: 100% 97% 98% 99%     General:  Well-developed and nourished.  Eyes: Anicteric no pallor.  ENT: No discharge from the ears eyes nose and mouth.  Neck: No mass felt.  Cardiovascular: S1 and S2 heard.  Respiratory: No rhonchi or crepitations.  Abdomen: Soft mild tenderness in the epigastric area and left upper quadrant. No guarding or rigidity.  Skin: No rash.  Musculoskeletal: No edema.  Psychiatric: Appears normal.  Neurologic: Alert awake oriented to time place and person. Moves all extremities.  Labs on Admission:  Basic Metabolic Panel:  Recent Labs Lab 04/04/14 0904 04/05/14 0004 04/05/14 0105  NA 140 137 138  K 4.2 3.9 3.9  CL  --  100 97  CO2 24 28  --   GLUCOSE 111 114* 109*  BUN 10.8 10 8   CREATININE 1.0 0.75 0.70  CALCIUM 9.2 9.4  --    Liver Function Tests:  Recent Labs Lab 04/04/14 0904 04/05/14 0004  AST 10 13  ALT <6 8  ALKPHOS 117 107  BILITOT 0.40 0.7  PROT 6.4 6.9  ALBUMIN 3.8 4.5    Recent Labs Lab 04/05/14 0004  LIPASE 15   No results for input(s): AMMONIA in the last 168 hours. CBC:  Recent Labs Lab 04/04/14 0903 04/05/14 0004 04/05/14 0105  WBC 8.6 8.1  --   NEUTROABS 5.6 6.6  --   HGB 12.9* 12.3* 15.0  HCT 40.5 38.8* 44.0  MCV 86.6 86.2  --   PLT  156 171  --    Cardiac Enzymes: No results for input(s): CKTOTAL, CKMB, CKMBINDEX, TROPONINI in the last 168 hours.  BNP (last 3 results) No results for input(s): BNP in the last 8760 hours.  ProBNP (last 3 results) No results for input(s): PROBNP in the last 8760 hours.  CBG: No results for input(s): GLUCAP in the last 168 hours.  Radiological Exams on Admission: Ct Abdomen Pelvis W Contrast  04/05/2014   CLINICAL DATA:  Acute onset of left upper quadrant abdominal pain. Known pancreatic cancer, status post chemotherapy. Assess for splenic vein thrombosis. Subsequent encounter.  EXAM: CT ABDOMEN AND PELVIS WITH CONTRAST  TECHNIQUE: Multidetector CT imaging of the abdomen and pelvis was performed using the standard protocol following bolus administration of intravenous contrast.  CONTRAST:  100 mL of Omnipaque 300 IV contrast  COMPARISON:  CT of the abdomen and pelvis performed 01/12/2014  FINDINGS: Minimal bibasilar atelectasis is noted.  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 Prominent collateral vessels are noted about the hepatic hilum and tail of the pancreas, draining the splenic vein and superior mesenteric venous branches.  The appearance is unchanged from the prior study. No new portal venous thrombosis is seen.  Abnormal soft tissue density is again noted tracking along the posterior aspect of the pancreatic body, encasing the celiac trunk. This is somewhat more diffuse than on the prior study, and reflects the patient's known pancreatic cancer.  There is mildly increased soft tissue inflammation extending along the left diaphragmatic crus, with a few scattered foci of soft tissue air at the medial left upper quadrant. On correlation with the patient's recent history, this reflects the patient's recent celiac ganglion block procedure.  Mildly worsened prominence of the intrahepatic  biliary ducts and common bile duct is noted. As previously suggested, most of the hypodensities within the liver likely reflect cysts, aside from a 9 mm mildly  hypoattenuating focus near the hepatic dome that may reflect a gradually enlarging metastasis.  The spleen is unremarkable in appearance. The adrenal glands are grossly unremarkable.  The kidneys are unremarkable in appearance. Minimal left-sided pelvicaliectasis remains within normal limits. Mild prominence of the ureters is relatively stable and likely reflects the patient's baseline. There is no evidence of hydronephrosis. No renal or ureteral stones are seen. No perinephric stranding is appreciated.  No free fluid is identified. The small bowel is unremarkable in appearance. The stomach is within normal limits. No acute vascular abnormalities are seen.  The appendix is normal in caliber, without evidence for appendicitis. The colon is largely filled with stool and is unremarkable in appearance. The sigmoid colon is somewhat redundant.  The bladder is mildly distended and grossly unremarkable. The prostate is normal in size. No inguinal lymphadenopathy is seen.  No acute osseous abnormalities are identified. There is chronic deformity involving the proximal right femur, with associated hardware.  IMPRESSION: 1. 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 Prominent collateral vessels about the hepatic hilum and at the tail of the pancreas drain the splenic vein and superior mesenteric venous branches. This is stable from the prior study. No evidence of new portal venous thrombosis. 2. Abnormal soft tissue density tracking along the posterior aspect of the pancreatic body, encasing the celiac trunk. This is somewhat more diffuse than on the prior study, and reflects the patient's known pancreatic cancer. 3. Mildly worsened soft tissue  inflammation extending along the left diaphragmatic crus, with a few scattered foci of soft tissue air at the medial left upper quadrant. On correlation with recent history, this reflects the patient's recent celiac ganglion block procedure. 4. Mildly worsened prominence of the intrahepatic biliary ducts and common bile duct noted, concerning for mildly worsened obstruction. 5. Scattered hepatic cysts; 9 mm mildly hypoattenuating focus near the hepatic dome may reflect a gradually enlarging metastasis.   Electronically Signed   By: Garald Balding M.D.   On: 04/05/2014 02:22     Assessment/Plan Principal Problem:   Abdominal pain Active Problems:   Hypertension   Malignant neoplasm of pancreas   1. Abdominal pain with history of pancreatic cancer - CT scan done in the ER shows mildly worsened soft tissue inflammation extending along the left diaphragmatic crus with a few scattered foci of soft tissue air at the medial left upper quadrant which made to patient's recent ganglion block procedure. At this time I have placed patient on Dilaudid 2 mg every 2 hourly and if patient's pain is under control then may have to start patient on PCA pump. Please consult patient's gastroenterologist Dr. Ardis Hughs in a.m. and also Dr. Learta Codding patient's oncologist for further recommendations. Gently hydrate. 2. Hypertension - continue home medication and when necessary IV hydralazine. 3. 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   DVT Prophylaxis on xarelto.  Code Status: Full code.  Family Communication: None.  Disposition Plan: Admit to inpatient.    Marcos Ruelas N. [facility] Pager [phone].  If 7PM-7AM, please contact night-coverage [email or web address] Password [facility] 04/05/2014, 5:36 AM

## 2014-04-05 NOTE — Progress Notes (Signed)
IP PROGRESS NOTE  Subjective:   Mr. Hochmuth has a history of advanced stage pancreas cancer. He is currently being treated with FOLFIRI. He has a history of increased pain over the past few weeks and underwent a repeat celiac block by Dr. Ardis Hughs on 03/31/2014. The pain has progressed despite this procedure. He was seen in the office yesterday and we added Dilaudid. He was to be scheduled for a restaging CT and follow-up visit later this week. Mr. Legler presented to the emergency room last night with severe abdominal pain. The pain was not relieved with oral Dilaudid. He continues to have pain this morning. The pain is chiefly in the left upper abdomen with less prominent mid back pain. He rates the pain at 7-8 at present. No nausea.  Objective: Vital signs in last 24 hours: Blood pressure 150/87, pulse 63, temperature 98.4 F (36.9 C), temperature source Oral, resp. rate 18, height 6' (1.829 m), weight 164 lb 0.4 oz (74.4 kg), SpO2 99 %.  Intake/Output from previous day:    Physical Exam:  Lungs: Lungs clear bilaterally Cardiac: Regular rate and rhythm Abdomen: No hepatomegaly, nontender, no mass, no apparent ascites Extremities: No leg edema   Portacath/PICC-without erythema  Lab Results:  Recent Labs  04/05/14 0004 04/05/14 0105 04/05/14 0715  WBC 8.1  --  9.0  HGB 12.3* 15.0 11.6*  HCT 38.8* 44.0 36.0*  PLT 171  --  154    BMET  Recent Labs  04/05/14 0004 04/05/14 0105 04/05/14 0715  NA 137 138 137  K 3.9 3.9 3.9  CL 100 97 100  CO2 28  --  29  GLUCOSE 114* 109* 97  BUN 10 8 9   CREATININE 0.75 0.70 0.63  CALCIUM 9.4  --  8.8    Studies/Results: Ct Abdomen Pelvis W Contrast  04/05/2014   CLINICAL DATA:  Acute onset of left upper quadrant abdominal pain. Known pancreatic cancer, status post chemotherapy. Assess for splenic vein thrombosis. Subsequent encounter.  EXAM: CT ABDOMEN AND PELVIS WITH CONTRAST  TECHNIQUE: Multidetector CT imaging of the abdomen and  pelvis was performed using the standard protocol following bolus administration of intravenous contrast.  CONTRAST:  100 mL of Omnipaque 300 IV contrast  COMPARISON:  CT of the abdomen and pelvis performed 01/12/2014  FINDINGS: Minimal bibasilar atelectasis is noted.  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 Prominent collateral vessels are noted about the hepatic hilum and tail of the pancreas, draining the splenic vein and superior mesenteric venous branches.  The appearance is unchanged from the prior study. No new portal venous thrombosis is seen.  Abnormal soft tissue density is again noted tracking along the posterior aspect of the pancreatic body, encasing the celiac trunk. This is somewhat more diffuse than on the prior study, and reflects the patient's known pancreatic cancer.  There is mildly increased soft tissue inflammation extending along the left diaphragmatic crus, with a few scattered foci of soft tissue air at the medial left upper quadrant. On correlation with the patient's recent history, this reflects the patient's recent celiac ganglion block procedure.  Mildly worsened prominence of the intrahepatic biliary ducts and common bile duct is noted. As previously suggested, most of the hypodensities within the liver likely reflect cysts, aside from a 9 mm mildly hypoattenuating focus near the hepatic dome that may reflect a gradually enlarging metastasis.  The spleen is unremarkable in appearance. The adrenal glands are  grossly unremarkable.  The kidneys are unremarkable in appearance. Minimal left-sided pelvicaliectasis remains within normal limits. Mild prominence of the ureters is relatively stable and likely reflects the patient's baseline. There is no evidence of hydronephrosis. No renal or ureteral stones are seen. No perinephric stranding is appreciated.  No free fluid is identified. The  small bowel is unremarkable in appearance. The stomach is within normal limits. No acute vascular abnormalities are seen.  The appendix is normal in caliber, without evidence for appendicitis. The colon is largely filled with stool and is unremarkable in appearance. The sigmoid colon is somewhat redundant.  The bladder is mildly distended and grossly unremarkable. The prostate is normal in size. No inguinal lymphadenopathy is seen.  No acute osseous abnormalities are identified. There is chronic deformity involving the proximal right femur, with associated hardware.  IMPRESSION: 1. 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 Prominent collateral vessels about the hepatic hilum and at the tail of the pancreas drain the splenic vein and superior mesenteric venous branches. This is stable from the prior study. No evidence of new portal venous thrombosis. 2. Abnormal soft tissue density tracking along the posterior aspect of the pancreatic body, encasing the celiac trunk. This is somewhat more diffuse than on the prior study, and reflects the patient's known pancreatic cancer. 3. Mildly worsened soft tissue inflammation extending along the left diaphragmatic crus, with a few scattered foci of soft tissue air at the medial left upper quadrant. On correlation with recent history, this reflects the patient's recent celiac ganglion block procedure. 4. Mildly worsened prominence of the intrahepatic biliary ducts and common bile duct noted, concerning for mildly worsened obstruction. 5. Scattered hepatic cysts; 9 mm mildly hypoattenuating focus near the hepatic dome may reflect a gradually enlarging metastasis.   Electronically Signed   By: Garald Balding M.D.   On: 04/05/2014 02:22    Medications: I have reviewed the patient's current medications.  Assessment/Plan: 1. Pancreas cancer.  07/06/2013  ,CA 19-9- 2067.   CT abdomen/pelvis 07/09/2013 with interval development of amorphous soft tissue circumferentially encasing the celiac axis and tracking caudally down toward the SMA. Associated stable irregular mild distention of the main pancreatic duct in the body and tail of the pancreas with no discrete mass lesion seen. 2.2 x 1.4 cm portacaval lymph node in the hepatoduodenal ligament had increased in size from 1.3 x 1.1 cm previously. Other scattered small lymph nodes seen in the hepatoduodenal ligament.   Endoscopic ultrasound 07/15/2013 with a very vague masslike lesion in the region of the uncinate pancreas. Fine needle aspiration showed atypical cells consistent with adenocarcinoma.   PET scan 07/29/2013 with hypermetabolic soft tissue posterior to the pancreas with an adjacent hypermetabolic lymph node, no hypermetabolic pancreas mass, no evidence of distant metastatic disease   Cycle 1 FOLFIRINOX 08/02/2013.   Cycle 2 FOLFIRINOX 08/16/2013.   Cycle 3 FOLFIRINOX 09/06/2013.   Cycle 4 FOLFIRINOX 09/21/2013.   CA 19-9 improved on 10/04/2013.   Cycle 5 FOLFIRINOX 10/04/2013.   Cycle 6 FOLFIRINOX 10/18/2013   Restaging CT 10/22/2013 with no change in the soft tissue surrounding the pancreas/celiac axis, scattered indeterminate low-attenuation liver lesions are more conspicuous.  Cycle 7 FOLFIRINOX 11/08/2013  Cycle 8 FOLFIRINOX 11/22/2013  Cycle 9 FOLFIRINOX 12/06/2013  Cycle 10 FOLFIRI (oxaliplatin eliminated) 12/20/2013.  Cycle 11 FOLFIRI 01/03/2014  Restaging CT 01/12/2014-no change in the soft tissue posterior to the pancreas or an  8 mm hepatic metastasis  Cycle 12 FOLFIRI 01/17/2014.  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, soft tissue density encasing the celiac axis, a possible 9 mm hepatic metastasis 2. Abdominal pain  secondary to #1. Progressive.  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  Status post celiac block 03/31/2014. 3. Hypertension. 4. History of gout. 5. Nausea and vomiting following FOLFIRINOX chemotherapy.  Aloxi and Emend added with cycle 2.   Decadron added with cycle 3. 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 7. Hospitalization 08/24/2013 through 08/27/2013 with poorly controlled nausea/vomiting. Diarrhea during the hospitalization. 8. Skin hyperpigmentation and dryness secondary to 5-fluorouracil  Mr. Harkins presents with increased abdominal pain. The pain is most likely secondary to pancreas cancer, though the CT shows no overt evidence of disease progression and the CA 19-9 was lower yesterday.  Recommendations: 1. Increase MS Contin 2. Increase the IV Dilaudid dose for breakthrough pain 3. I will follow him closely with the medicine service over the next few days to try and get his pain under better control. 4. We will decide on resuming systemic therapy as an outpatient.    LOS: 0 days   Avianah Pellman, Dominica Severin  04/05/2014, 8:54 AM

## 2014-04-05 NOTE — Progress Notes (Signed)
Progress Note   Ray [name] YEM:[id number] DOB: 14-Feb-1955 DOA: 04/04/2014 PCP: Chrisandra Netters, MD   Brief Narrative:   Ray [name] is an 59 y.o. male with a PMH of pancreatic cancer, status post 15 cycles of chemotherapy, last treated with FOLFIRI 03/07/14, status post celiac plexus neurolysis for increased abdominal pain 03/31/14, scheduled to have restaging CT scans due to ongoing abdominal pain, who was admitted 04/04/14 with a chief complaint of worsening abdominal pain in the epigastric and left upper quadrant area. CT scan of the abdomen and pelvis done on admission shows diffuse soft tissue density tracking along the posterior aspect of the pancreatic body encasing the celiac trunk, worsened soft tissue inflammation extending along the left diaphragmatic crus, and mildly worsened prominence of the intrahepatic biliary ducts/common bile duct concerning for mildly worsened obstruction.  Assessment/Plan:   Principal Problem:   Malignant neoplasm of pancreas with intractable abdominal pain/cancer related pain - CT scan findings as noted below. - [name] following. - No elevation of LFTs despite findings on CT scan concerning for intrahepatic biliary duct/common bile duct obstruction. - Continue pain management efforts.  Continue Cymbalta, MS Contin, and IV Dilaudid for pain control.  Pain medications adjusted by oncologist. - Consider palliative care consultation for symptom control.   Active Problems:   Hypertension - Continue Norvasc and as needed hydralazine.    GERD (gastroesophageal reflux disease) - Continue Protonix.    Splenic vein thrombosis - Continue Xarelto.    Protein-calorie malnutrition, severe  - Dietitian consultation requested.    DVT Prophylaxis - On chronic anticoagulation with Xarelto.  Code Status: Full. Family Communication: Wife updated at the bedside. Disposition Plan: Home when stable.   IV Access:    Peripheral  IV   Procedures and diagnostic studies:   Ct Abdomen Pelvis W Contrast 04/05/2014: 1. 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 Prominent collateral vessels about the hepatic hilum and at the tail of the pancreas drain the splenic vein and superior mesenteric venous branches. This is stable from the prior study. No evidence of new portal venous thrombosis. 2. Abnormal soft tissue density tracking along the posterior aspect of the pancreatic body, encasing the celiac trunk. This is somewhat more diffuse than on the prior study, and reflects the patient's known pancreatic cancer. 3. Mildly worsened soft tissue inflammation extending along the left diaphragmatic crus, with a few scattered foci of soft tissue air at the medial left upper quadrant. On correlation with recent history, this reflects the patient's recent celiac ganglion block procedure. 4. Mildly worsened prominence of the intrahepatic biliary ducts and common bile duct noted, concerning for mildly worsened obstruction. 5. Scattered hepatic cysts; 9 mm mildly hypoattenuating focus near the hepatic dome may reflect a gradually enlarging metastasis.     Medical Consultants:    Dr. Julieanne Manson, Oncology.  Anti-Infectives:    None.  Subjective:   Ray [name] reports abdominal pain 7/10, improved from 10/10 with dosage adjustment of pain medication.  No nausea or vomiting.  No diarrhea.  Appetite good.    Objective:    Filed Vitals:   04/05/14 0114 04/05/14 0154 04/05/14 0236 04/05/14 0334  BP: 148/94 144/90 149/92 150/87  Pulse: 75 82 71 63  Temp:    98.4 F (36.9 C)  TempSrc:    Oral  Resp: 18 18 18 18   Height:    6' (1.829  m)  Weight:    74.4 kg (164 lb 0.4 oz)  SpO2: 100% 97% 98% 99%   No intake or output data in the 24 hours ending 04/05/14 0753  Exam: Gen:  NAD Cardiovascular:  RRR, No  M/R/G Respiratory:  Lungs CTAB Gastrointestinal:  Abdomen soft, NT/ND, + BS Extremities:  No C/E/C   Data Reviewed:    Labs: Basic Metabolic Panel:  Recent Labs Lab 04/04/14 0904 04/05/14 0004 04/05/14 0105  NA 140 137 138  K 4.2 3.9 3.9  CL  --  100 97  CO2 24 28  --   GLUCOSE 111 114* 109*  BUN 10.8 10 8   CREATININE 1.0 0.75 0.70  CALCIUM 9.2 9.4  --    GFR Estimated Creatinine Clearance: 104.6 mL/min (by C-G formula based on Cr of 0.7). Liver Function Tests:  Recent Labs Lab 04/04/14 0904 04/05/14 0004  AST 10 13  ALT <6 8  ALKPHOS 117 107  BILITOT 0.40 0.7  PROT 6.4 6.9  ALBUMIN 3.8 4.5    Recent Labs Lab 04/05/14 0004  LIPASE 15   CBC:  Recent Labs Lab 04/04/14 0903 04/05/14 0004 04/05/14 0105  WBC 8.6 8.1  --   NEUTROABS 5.6 6.6  --   HGB 12.9* 12.3* 15.0  HCT 40.5 38.8* 44.0  MCV 86.6 86.2  --   PLT 156 171  --     Microbiology No results found for this or any previous visit (from the past 240 hour(s)).   Medications:   . amLODipine  10 mg Oral Daily  . DULoxetine  30 mg Oral Daily  . lidocaine-prilocaine   Topical Once  . morphine  30 mg Oral Q12H  . pantoprazole  40 mg Oral Daily  . potassium chloride SA  20 mEq Oral Daily  . rivaroxaban  20 mg Oral Q breakfast   Continuous Infusions: . sodium chloride 75 mL/hr at 04/05/14 0634    Time spent: 25 minutes.   LOS: 0 days   Ray [name]  [facility] Pager [phone]. If unable to reach me by pager, please call my cell phone at [phone].  *Please refer to [email or web address], password [email or web address] to get updated schedule on who will round on this patient, as hospitalists switch teams weekly. If 7PM-7AM, please contact night-coverage at [email or web address], password [email or web address] for any overnight needs.  04/05/2014, 7:53 AM

## 2014-04-05 NOTE — Telephone Encounter (Signed)
Received message from pt's daughter Kenney Houseman re: had scan in hospital--does he need the other one scheduled for 3/2? And does he need to come to the MD appt 3/3.  Per [name]; notified Kenney Houseman that pt does not have to have another CT scan scheduled for 3/2; that will be cancelled and MD does want pt to keep appt 3/3 is discharged, so will keep on schedule for now.  Pt's daughter verbalized understanding and expressed appreciation for call back.

## 2014-04-05 NOTE — ED Notes (Signed)
Charge RN was called to patients room, upon my arrival the wife of the patient was yelling at [employer], I tried to speak to the wife and she begun yelling at me, Dr Claudine Mouton was present.

## 2014-04-05 NOTE — ED Notes (Signed)
Report given to [facility], pt ready for transport.

## 2014-04-05 NOTE — ED Provider Notes (Signed)
CSN: [id number]     Arrival date & time 04/04/14  2054 History   First MD Initiated Contact with Patient 04/04/14 2350     Chief Complaint  Patient presents with  . Abdominal Pain  . Nausea  . Pancreatic Cancer     (Consider location/radiation/quality/duration/timing/severity/associated sxs/prior Treatment) HPI Ray [name] is a 59 y.o. male with past medical history pancreatic cancer coming in with worsening abdominal pain. Patient states this has been going on for the past 2 days. He describes his pain as his cancer pain which radiates to his back. He has gotten 15 sessions of chemotherapy, his last session was one month ago. He states he oxycodone and Dilaudid at home without any relief. Patient has had a good appetite, he has had nausea with no episodes of emesis. He denies any fevers or recent infections. He has no changes in his urinary or bowel output. He has no further complaints.  10 Systems reviewed and are negative for acute change except as noted in the HPI.    Past Medical History  Diagnosis Date  . Blood transfusion   . GERD (gastroesophageal reflux disease)   . Hyperlipidemia   . Hypertension   . H/O hiatal hernia   . Arthritis     in his back, hips, knees, R shoulder   . Colon polyps     TUBULAR ADENOMA  . Splenic vein thrombosis   . Liver cyst   . Hx of pancreatitis   . Gout   . Pancreatic cancer    Past Surgical History  Procedure Laterality Date  . Hip surgery      x2 right hip- retained hardware ambulates with cane  . Shoulder surgery      right  . Hand surgery      left thumb tendon repair  . Femur hardware removal  2011    R femur- prothesis removed & replaced  . Posterior cervical fusion/foraminotomy N/A 04/20/2012    Procedure: POSTERIOR CERVICAL FUSION/FORAMINOTOMY LEVEL 1;  Surgeon: Ophelia Charter, MD;  Location: [facility];  Service: Neurosurgery;  Laterality: N/A;  C1/2 laminectomies with posterior screw fixation  . Colonoscopy w/  biopsies  04/15/2011  . Eus N/A 06/10/2013    Procedure: UPPER ENDOSCOPIC ULTRASOUND (EUS) LINEAR;  Surgeon: Milus Banister, MD;  Location: [facility];  Service: Endoscopy;  Laterality: N/A;  . Eus Bilateral 07/15/2013    Procedure: ESOPHAGEAL ENDOSCOPIC ULTRASOUND (EUS) RADIAL;  Surgeon: Milus Banister, MD;  Location: [facility];  Service: Endoscopy;  Laterality: Bilateral;  . Eus N/A 08/05/2013    Procedure: ESOPHAGEAL ENDOSCOPIC ULTRASOUND (EUS) RADIAL;  Surgeon: Milus Banister, MD;  Location: [facility];  Service: Endoscopy;  Laterality: N/A;  . Neurolytic celiac plexus N/A 08/05/2013    Procedure: NEUROLYTIC CELIAC PLEXUS;  Surgeon: Milus Banister, MD;  Location: [facility];  Service: Endoscopy;  Laterality: N/A;  . Eus N/A 03/31/2014    Procedure: UPPER ENDOSCOPIC ULTRASOUND (EUS) LINEAR;  Surgeon: Milus Banister, MD;  Location: [facility];  Service: Endoscopy;  Laterality: N/A;   celiac plexus neurolysis  . Neurolytic celiac plexus N/A 03/31/2014    Procedure: NEUROLYTIC CELIAC PLEXUS;  Surgeon: Milus Banister, MD;  Location: [facility];  Service: Endoscopy;  Laterality: N/A;   Family History  Problem Relation Age of Onset  . Colon cancer Neg Hx   . Esophageal cancer Neg Hx   . Stomach cancer Neg Hx   . Rectal cancer Neg Hx  History  Substance Use Topics  . Smoking status: Current Every Day Smoker    Types: Cigars  . Smokeless tobacco: Never Used     Comment: one a day cigar x 6 months. Quit cigarettes -20 yrs ago.  . Alcohol Use: No     Comment: Quit heavy use 30 yrs ago.none now    Review of Systems    Allergies  Review of patient's allergies indicates no known allergies.  Home Medications   Prior to Admission medications   Medication Sig Start Date End Date Taking? Authorizing Provider  amLODipine (NORVASC) 10 MG tablet Take 1 tablet (10 mg total) by mouth daily. 11/23/13  Yes Leeanne Rio, MD  DULoxetine (CYMBALTA) 30 MG capsule TAKE 1 CAPSULE  DAILY 02/02/14  Yes Leeanne Rio, MD  HYDROmorphone (DILAUDID) 4 MG tablet Take 1-2 tablets (4-8 mg total) by mouth every 4 (four) hours as needed for severe pain. 04/04/14  Yes Owens Shark, NP  lidocaine-prilocaine (EMLA) cream Apply over port area 1-2 hours prior to treatment and cover with plastic wrap.  DO NOT RUB IN. 01/17/14  Yes Ladell Pier, MD  morphine (MS CONTIN) 30 MG 12 hr tablet Take 1 tablet (30 mg total) by mouth every 12 (twelve) hours. 03/07/14  Yes Owens Shark, NP  omeprazole (PRILOSEC) 40 MG capsule TAKE 1 CAPSULE DAILY 12/21/13  Yes Leeanne Rio, MD  oxyCODONE-acetaminophen (PERCOCET) 10-325 MG per tablet Take 1-2 tablets by mouth every 4 (four) hours as needed for pain 03/07/14  Yes Owens Shark, NP  potassium chloride SA (K-DUR,KLOR-CON) 20 MEQ tablet Take 1 tablet (20 mEq total) by mouth daily. 11/22/13  Yes Ladell Pier, MD  prochlorperazine (COMPAZINE) 10 MG tablet Take 1 tablet (10 mg total) by mouth every 6 (six) hours as needed for nausea or vomiting. 01/03/14  Yes Owens Shark, NP  rivaroxaban (XARELTO) 20 MG TABS tablet Take 1 tablet (20 mg total) by mouth every morning. 08/16/13  Yes Ladell Pier, MD  dexamethasone (DECADRON) 4 MG tablet TAKE 2 TABLETS TWICE DAILY FOR 3 DAYS BEGINNING THE DAY AFTER CHEMO 03/21/14   Owens Shark, NP  loperamide (IMODIUM) 2 MG capsule Take 1 capsule (2 mg total) by mouth as needed for diarrhea or loose stools. Maximum 8 tabs per day 01/03/14   Owens Shark, NP  Polyethyl Glycol-Propyl Glycol (SYSTANE) 0.4-0.3 % SOLN Apply 1 drop to eye 3 (three) times daily as needed (dry eyes).    Historical Provider, MD  sorbitol 70 % SOLN Take 15 ml - 30 ml by mouth 2 (two) times a days as needed for constipation 07/30/13   Ladell Pier, MD  VIAGRA 50 MG tablet TAKE 1 TABLET AS NEEDED 10/21/13   Leeanne Rio, MD   BP 149/92 mmHg  Pulse 71  Temp(Src) 99.1 F (37.3 C) (Oral)  Resp 18  SpO2 98% Physical Exam   Constitutional: He is oriented to person, place, and time. Vital signs are normal. He appears well-developed and well-nourished.  Non-toxic appearance. He does not appear ill. No distress.  HENT:  Head: Normocephalic and atraumatic.  Nose: Nose normal.  Mouth/Throat: Oropharynx is clear and moist. No oropharyngeal exudate.  Eyes: Conjunctivae and EOM are normal. Pupils are equal, round, and reactive to light. No scleral icterus.  Neck: Normal range of motion. Neck supple. No tracheal deviation, no edema, no erythema and normal range of motion present. No thyroid mass and no thyromegaly present.  Cardiovascular: Normal rate, regular rhythm, S1 normal, S2 normal, normal heart sounds, intact distal pulses and normal pulses.  Exam reveals no gallop and no friction rub.   No murmur heard. Pulses:      Radial pulses are 2+ on the right side, and 2+ on the left side.       Dorsalis pedis pulses are 2+ on the right side, and 2+ on the left side.  Pulmonary/Chest: Effort normal and breath sounds normal. No respiratory distress. He has no wheezes. He has no rhonchi. He has no rales.  Abdominal: Soft. Normal appearance and bowel sounds are normal. He exhibits no distension, no ascites and no mass. There is no hepatosplenomegaly. There is tenderness. There is no rebound, no guarding and no CVA tenderness.  Musculoskeletal: Normal range of motion. He exhibits no edema or tenderness.  Lymphadenopathy:    He has no cervical adenopathy.  Neurological: He is alert and oriented to person, place, and time. He has normal strength. No cranial nerve deficit or sensory deficit. He exhibits normal muscle tone.  Skin: Skin is warm, dry and intact. No petechiae and no rash noted. He is not diaphoretic. No erythema. No pallor.  Psychiatric: He has a normal mood and affect. His behavior is normal. Judgment normal.  Nursing note and vitals reviewed.   ED Course  Procedures (including critical care time) Labs  Review Labs Reviewed  CBC WITH DIFFERENTIAL/PLATELET - Abnormal; Notable for the following:    Hemoglobin 12.3 (*)    HCT 38.8 (*)    RDW 19.7 (*)    Neutrophils Relative % 82 (*)    All other components within normal limits  COMPREHENSIVE METABOLIC PANEL - Abnormal; Notable for the following:    Glucose, Bld 114 (*)    All other components within normal limits  I-STAT CHEM 8, ED - Abnormal; Notable for the following:    Glucose, Bld 109 (*)    All other components within normal limits  LIPASE, BLOOD    Imaging Review Ct Abdomen Pelvis W Contrast  04/05/2014   CLINICAL DATA:  Acute onset of left upper quadrant abdominal pain. Known pancreatic cancer, status post chemotherapy. Assess for splenic vein thrombosis. Subsequent encounter.  EXAM: CT ABDOMEN AND PELVIS WITH CONTRAST  TECHNIQUE: Multidetector CT imaging of the abdomen and pelvis was performed using the standard protocol following bolus administration of intravenous contrast.  CONTRAST:  100 mL of Omnipaque 300 IV contrast  COMPARISON:  CT of the abdomen and pelvis performed 01/12/2014  FINDINGS: Minimal bibasilar atelectasis is noted.  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 Prominent collateral vessels are noted about the hepatic hilum and tail of the pancreas, draining the splenic vein and superior mesenteric venous branches.  The appearance is unchanged from the prior study. No new portal venous thrombosis is seen.  Abnormal soft tissue density is again noted tracking along the posterior aspect of the pancreatic body, encasing the celiac trunk. This is somewhat more diffuse than on the prior study, and reflects the patient's known pancreatic cancer.  There is mildly increased soft tissue inflammation extending along the left diaphragmatic crus, with a few scattered foci of soft tissue air at the medial left upper quadrant. On  correlation with the patient's recent history, this reflects the patient's recent celiac ganglion block procedure.  Mildly worsened prominence of the intrahepatic biliary ducts and common bile duct is noted. As previously suggested, most  of the hypodensities within the liver likely reflect cysts, aside from a 9 mm mildly hypoattenuating focus near the hepatic dome that may reflect a gradually enlarging metastasis.  The spleen is unremarkable in appearance. The adrenal glands are grossly unremarkable.  The kidneys are unremarkable in appearance. Minimal left-sided pelvicaliectasis remains within normal limits. Mild prominence of the ureters is relatively stable and likely reflects the patient's baseline. There is no evidence of hydronephrosis. No renal or ureteral stones are seen. No perinephric stranding is appreciated.  No free fluid is identified. The small bowel is unremarkable in appearance. The stomach is within normal limits. No acute vascular abnormalities are seen.  The appendix is normal in caliber, without evidence for appendicitis. The colon is largely filled with stool and is unremarkable in appearance. The sigmoid colon is somewhat redundant.  The bladder is mildly distended and grossly unremarkable. The prostate is normal in size. No inguinal lymphadenopathy is seen.  No acute osseous abnormalities are identified. There is chronic deformity involving the proximal right femur, with associated hardware.  IMPRESSION: 1. 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 Prominent collateral vessels about the hepatic hilum and at the tail of the pancreas drain the splenic vein and superior mesenteric venous branches. This is stable from the prior study. No evidence of new portal venous thrombosis. 2. Abnormal soft tissue density tracking along the posterior aspect of the pancreatic body,  encasing the celiac trunk. This is somewhat more diffuse than on the prior study, and reflects the patient's known pancreatic cancer. 3. Mildly worsened soft tissue inflammation extending along the left diaphragmatic crus, with a few scattered foci of soft tissue air at the medial left upper quadrant. On correlation with recent history, this reflects the patient's recent celiac ganglion block procedure. 4. Mildly worsened prominence of the intrahepatic biliary ducts and common bile duct noted, concerning for mildly worsened obstruction. 5. Scattered hepatic cysts; 9 mm mildly hypoattenuating focus near the hepatic dome may reflect a gradually enlarging metastasis.   Electronically Signed   By: Garald Balding M.D.   On: 04/05/2014 02:22     EKG Interpretation None      MDM   Final diagnoses:  Abdominal pain    Patient presents emergency department for abdominal pain, likely secondary to his malignancy. CT scan was planned for tomorrow so I obtain one tonight which shows progression of his chronic conditions in his abdomen. I do not have any other causes of his abdominal pain aside from the cancer. He's got multiple doses of Dilaudid without any relief. At this time the patient will need to be admitted for pain control. [facility] was called for admission, medsurg unit.    Everlene Balls, MD 04/05/14 [phone]

## 2014-04-05 NOTE — Progress Notes (Signed)
INITIAL NUTRITION ASSESSMENT  DOCUMENTATION CODES Per approved criteria  -Severe malnutrition in the context of chronic illness  Pt meets criteria for severe MALNUTRITION in the context of chronic illness as evidenced by severe muscle depletion and 4% weight loss x 2 weeks.  INTERVENTION: Encourage PO intake RD to continue to monitor (PO intake)  NUTRITION DIAGNOSIS: Unintentional weight loss related to pancreatic cancer as evidenced by 38 lb weight loss x 1 year.   Goal: Pt to meet >/= 90% of their estimated nutrition needs   Monitor:  PO and supplemental intake, weight, labs, I/O's  Reason for Assessment: Consult for nutritional assessment  Admitting Dx: Malignant neoplasm of pancreas  ASSESSMENT: 59 y.o. male with known history of pancreatic cancer on chemotherapy, last chemotherapy was last month, had cervical plexus neurolysis last week by Dr. Ardis Hughs presents to the ER because of worsening pain over the last 5 days. Patient's pain is mostly in the epigastric and left upper quadrant. Denies any nausea vomiting or diarrhea and has been able to eat his regular food.  Pt reports he was eating "voraciously" before his pain episodes began. States that chemo has not affected his appetite so far.  Pt eating 50% of meals at this time.  Per weight history, pt has lost 6 lb since 2/17 (4% weight loss x 2 weeks, significant for time frame). Over the past year, pt has lost a total of 38 lb.   Pt not interested in nutritional supplements at this time. RD encouraged frequent meals and snacks. Informed pt that supplements are available on the unit if needed.  Nutrition Focused Physical Exam:  Subcutaneous Fat:  Orbital Region: mild depletion Upper Arm Region: moderate depletion Thoracic and Lumbar Region: NA  Muscle:  Temple Region: moderate depletion Clavicle Bone Region: severe depletion Clavicle and Acromion Bone Region: severe depletion Scapular Bone Region: moderate  depletion Dorsal Hand: moderate depletion Patellar Region: moderate depletion Anterior Thigh Region: moderate depletion Posterior Calf Region: moderate depletion  Edema: no LE edema  Labs reviewed: WNL   Height: Ht Readings from Last 1 Encounters:  04/05/14 6' (1.829 m)    Weight: Wt Readings from Last 1 Encounters:  04/05/14 164 lb 0.4 oz (74.4 kg)    Ideal Body Weight: 178 lb  % Ideal Body Weight: 92%  Wt Readings from Last 10 Encounters:  04/05/14 164 lb 0.4 oz (74.4 kg)  04/04/14 167 lb 12.8 oz (76.114 kg)  03/23/14 170 lb (77.111 kg)  03/07/14 169 lb (76.658 kg)  02/21/14 166 lb 8 oz (75.524 kg)  02/07/14 168 lb 11.2 oz (76.522 kg)  01/14/14 166 lb 1.6 oz (75.342 kg)  01/03/14 163 lb 4.8 oz (74.072 kg)  12/20/13 165 lb 1.6 oz (74.889 kg)  12/06/13 164 lb (74.39 kg)    Usual Body Weight: 220 lb  % Usual Body Weight: 75%  BMI:  Body mass index is 22.24 kg/(m^2).  Estimated Nutritional Needs: Kcal: 2100-2300 Protein: 105-115g Fluid: 2.1L/day  Skin: intact  Diet Order: Diet Heart  EDUCATION NEEDS: -No education needs identified at this time   Intake/Output Summary (Last 24 hours) at 04/05/14 0940 Last data filed at 04/05/14 0830  Gross per 24 hour  Intake      0 ml  Output    350 ml  Net   -350 ml    Last BM: 2/29  Labs:   Recent Labs Lab 04/04/14 0904 04/05/14 0004 04/05/14 0105 04/05/14 0715  NA 140 137 138 137  K 4.2 3.9  3.9 3.9  CL  --  100 97 100  CO2 24 28  --  29  BUN 10.8 10 8 9   CREATININE 1.0 0.75 0.70 0.63  CALCIUM 9.2 9.4  --  8.8  GLUCOSE 111 114* 109* 97    CBG (last 3)  No results for input(s): GLUCAP in the last 72 hours.  Scheduled Meds: . amLODipine  10 mg Oral Daily  . DULoxetine  30 mg Oral Daily  . lidocaine-prilocaine   Topical Once  . morphine  60 mg Oral Q12H  . pantoprazole  40 mg Oral Daily  . potassium chloride SA  20 mEq Oral Daily  . rivaroxaban  20 mg Oral Q breakfast    Continuous  Infusions: . sodium chloride 75 mL/hr at 04/05/14 0830    Past Medical History  Diagnosis Date  . Blood transfusion   . GERD (gastroesophageal reflux disease)   . Hyperlipidemia   . Hypertension   . H/O hiatal hernia   . Arthritis     in his back, hips, knees, R shoulder   . Colon polyps     TUBULAR ADENOMA  . Splenic vein thrombosis   . Liver cyst   . Hx of pancreatitis   . Gout   . Pancreatic cancer     Past Surgical History  Procedure Laterality Date  . Hip surgery      x2 right hip- retained hardware ambulates with cane  . Shoulder surgery      right  . Hand surgery      left thumb tendon repair  . Femur hardware removal  2011    R femur- prothesis removed & replaced  . Posterior cervical fusion/foraminotomy N/A 04/20/2012    Procedure: POSTERIOR CERVICAL FUSION/FORAMINOTOMY LEVEL 1;  Surgeon: Ophelia Charter, MD;  Location: [place] [facility];  Service: Neurosurgery;  Laterality: N/A;  C1/2 laminectomies with posterior screw fixation  . Colonoscopy w/ biopsies  04/15/2011  . Eus N/A 06/10/2013    Procedure: UPPER ENDOSCOPIC ULTRASOUND (EUS) LINEAR;  Surgeon: Milus Banister, MD;  Location: [facility];  Service: Endoscopy;  Laterality: N/A;  . Eus Bilateral 07/15/2013    Procedure: ESOPHAGEAL ENDOSCOPIC ULTRASOUND (EUS) RADIAL;  Surgeon: Milus Banister, MD;  Location: [facility];  Service: Endoscopy;  Laterality: Bilateral;  . Eus N/A 08/05/2013    Procedure: ESOPHAGEAL ENDOSCOPIC ULTRASOUND (EUS) RADIAL;  Surgeon: Milus Banister, MD;  Location: [facility];  Service: Endoscopy;  Laterality: N/A;  . Neurolytic celiac plexus N/A 08/05/2013    Procedure: NEUROLYTIC CELIAC PLEXUS;  Surgeon: Milus Banister, MD;  Location: [facility];  Service: Endoscopy;  Laterality: N/A;  . Eus N/A 03/31/2014    Procedure: UPPER ENDOSCOPIC ULTRASOUND (EUS) LINEAR;  Surgeon: Milus Banister, MD;  Location: [facility];  Service: Endoscopy;  Laterality: N/A;   celiac plexus neurolysis  .  Neurolytic celiac plexus N/A 03/31/2014    Procedure: NEUROLYTIC CELIAC PLEXUS;  Surgeon: Milus Banister, MD;  Location: [facility];  Service: Endoscopy;  Laterality: N/A;    Clayton Bibles, MS, RD, LDN Pager: [phone] After Hours Pager: [phone]

## 2014-04-05 NOTE — ED Notes (Addendum)
Pt w/ hx of pancreatic cancer, admits to acute onset of left-sided abd pain x2-3 days - pt has rx for 4mg  dilaudid and minimal improvement in pain. Pt denies n/v/d. Last dose of chemotherapy x3 weeks ago.

## 2014-04-06 ENCOUNTER — Ambulatory Visit (HOSPITAL_COMMUNITY): Payer: Medicare Other

## 2014-04-06 ENCOUNTER — Ambulatory Visit

## 2014-04-06 ENCOUNTER — Encounter

## 2014-04-06 ENCOUNTER — Other Ambulatory Visit: Payer: Self-pay | Admitting: Oncology

## 2014-04-06 DIAGNOSIS — Z515 Encounter for palliative care: Secondary | ICD-10-CM

## 2014-04-06 DIAGNOSIS — R109 Unspecified abdominal pain: Secondary | ICD-10-CM

## 2014-04-06 MED ORDER — MORPHINE SULFATE 15 MG PO TABS
15.0000 mg | ORAL_TABLET | ORAL | Status: DC | PRN
  Administered 2014-04-06: 30 mg via ORAL
  Filled 2014-04-06: qty 2

## 2014-04-06 MED ORDER — MORPHINE SULFATE ER 30 MG PO TBCR: 75.0000 mg | EXTENDED_RELEASE_TABLET | Freq: Two times a day (BID) | ORAL | Status: DC

## 2014-04-06 MED ORDER — MORPHINE SULFATE 30 MG PO TABS
30.0000 mg | ORAL_TABLET | ORAL | Status: DC | PRN
  Administered 2014-04-06 – 2014-04-08 (×10): 30 mg via ORAL
  Filled 2014-04-06 (×10): qty 1

## 2014-04-06 MED ORDER — HYDROMORPHONE HCL 2 MG/ML IJ SOLN
4.0000 mg | INTRAMUSCULAR | Status: DC | PRN
  Administered 2014-04-06 – 2014-04-07 (×2): 4 mg via INTRAVENOUS
  Filled 2014-04-06 (×2): qty 2

## 2014-04-06 MED ORDER — MORPHINE SULFATE ER 30 MG PO TBCR
60.0000 mg | EXTENDED_RELEASE_TABLET | Freq: Three times a day (TID) | ORAL | Status: DC
  Administered 2014-04-06 – 2014-04-07 (×3): 60 mg via ORAL
  Filled 2014-04-06 (×4): qty 2

## 2014-04-06 MED ORDER — SENNOSIDES-DOCUSATE SODIUM 8.6-50 MG PO TABS: 2.0000 | ORAL_TABLET | Freq: Every evening | ORAL | Status: DC | PRN

## 2014-04-06 NOTE — Progress Notes (Signed)
[facility] PROGRESS NOTE  JKWON TREPTOW TWK:[id number] DOB: [birth year] DOA: 04/04/2014 PCP: Chrisandra Netters, MD  Assessment/Plan: 1. Intractable abdominal pain likely secondary to pancreatic cancer -Patient having a CT scan of abdomen and pelvis which did not reveal evidence of common bile duct obstruction or acute intra-abdominal infectious process. Findings appear to be chronic. -Pain likely related to underlying pancreatic malignancy. -Patient was started on MS Contin 60 mg by mouth twice a day with immediate release morphine 15-30 mg every 4 hours as needed for breakthrough pain. -Patient stating that his pain symptoms are still not controlled on oral narcotic analgesics -Palliative care consulted for assistance with pain management  2.  Hypertension. -Patient's systolic blood pressures fluctuated between 02/24/1999 40 -Continue amlodipine 10 mg by mouth daily  3.  Splenic vein thrombosis -CT scan showing stable findings -Continue anticoagulation therapy with Xarelto  Code Status: Full code Family Communication:  Disposition Plan: Palliative care consultation for pain management, anticipate discharge in the next 24 hours   Consultants:  Medical oncology  Palliative care    HPI/Subjective: Patient is a pleasant 59 year old gentleman with a past medical history of pancreatic cancer, status post chemotherapy, admitted to the medicine service on 04/05/2014 presenting with increasing abdominal pain located in epigastric and left upper quadrant region. Initial workup included a CT scan of abdomen and pelvis with IV contrast performed on 04/05/2014 that showed splenic vein and superior mesenteric vein thrombosis stable from prior study, soft tissue density tracking along the posterior aspect of the pancreatic body encasing the celiac trunk appear to be somewhat more diffuse than on the prior study and consistent with patient's known history of pancreatic cancer. Patient  was seen and evaluated by Dr Learta Codding of medical oncology.  Objective: Filed Vitals:   04/06/14 0603  BP: 145/93  Pulse: 71  Temp: 98.3 F (36.8 C)  Resp: 16    Intake/Output Summary (Last 24 hours) at 04/06/14 1318 Last data filed at 04/06/14 1044  Gross per 24 hour  Intake   2172 ml  Output   1950 ml  Net    222 ml   Filed Weights   04/05/14 0334  Weight: 74.4 kg (164 lb 0.4 oz)    Exam:   General:  Patient reports ongoing pain located to epigastric and left upper quadrant region appears somewhat uncomfortable  Cardiovascular: Regular rate and rhythm normal S1-S2  Respiratory: Normal respiratory effort lungs overall clear to auscultation  Abdomen: Patient reporting pain over epigastric and left upper quadrant region, abdomen otherwise is nondistended no rebound tenderness or guarding  Musculoskeletal: No edema  Data Reviewed: Basic Metabolic Panel:  Recent Labs Lab 04/04/14 0904 04/05/14 0004 04/05/14 0105 04/05/14 0715  NA 140 137 138 137  K 4.2 3.9 3.9 3.9  CL  --  100 97 100  CO2 24 28  --  29  GLUCOSE 111 114* 109* 97  BUN 10.8 10 8 9   CREATININE 1.0 0.75 0.70 0.63  CALCIUM 9.2 9.4  --  8.8   Liver Function Tests:  Recent Labs Lab 04/04/14 0904 04/05/14 0004 04/05/14 0715  AST 10 13 10   ALT 6 8 7   ALKPHOS 117 107 94  BILITOT 0.40 0.7 0.6  PROT 6.4 6.9 6.2  ALBUMIN 3.8 4.5 3.9    Recent Labs Lab 04/05/14 0004  LIPASE 15   No results for input(s): AMMONIA in the last 168 hours. CBC:  Recent Labs Lab 04/04/14 0903 04/05/14 0004 04/05/14 0105 04/05/14 0715  WBC 8.6  8.1  --  9.0  NEUTROABS 5.6 6.6  --  6.6  HGB 12.9* 12.3* 15.0 11.6*  HCT 40.5 38.8* 44.0 36.0*  MCV 86.6 86.2  --  85.7  PLT 156 171  --  154   Cardiac Enzymes: No results for input(s): CKTOTAL, CKMB, CKMBINDEX, TROPONINI in the last 168 hours. BNP (last 3 results) No results for input(s): BNP in the last 8760 hours.  ProBNP (last 3 results) No results for  input(s): PROBNP in the last 8760 hours.  CBG: No results for input(s): GLUCAP in the last 168 hours.  No results found for this or any previous visit (from the past 240 hour(s)).   Studies: Ct Abdomen Pelvis W Contrast  04/05/2014   CLINICAL DATA:  Acute onset of left upper quadrant abdominal pain. Known pancreatic cancer, status post chemotherapy. Assess for splenic vein thrombosis. Subsequent encounter.  EXAM: CT ABDOMEN AND PELVIS WITH CONTRAST  TECHNIQUE: Multidetector CT imaging of the abdomen and pelvis was performed using the standard protocol following bolus administration of intravenous contrast.  CONTRAST:  100 mL of Omnipaque 300 IV contrast  COMPARISON:  CT of the abdomen and pelvis performed 01/12/2014  FINDINGS: Minimal bibasilar atelectasis is noted.  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 Prominent collateral vessels are noted about the hepatic hilum and tail of the pancreas, draining the splenic vein and superior mesenteric venous branches.  The appearance is unchanged from the prior study. No new portal venous thrombosis is seen.  Abnormal soft tissue density is again noted tracking along the posterior aspect of the pancreatic body, encasing the celiac trunk. This is somewhat more diffuse than on the prior study, and reflects the patient's known pancreatic cancer.  There is mildly increased soft tissue inflammation extending along the left diaphragmatic crus, with a few scattered foci of soft tissue air at the medial left upper quadrant. On correlation with the patient's recent history, this reflects the patient's recent celiac ganglion block procedure.  Mildly worsened prominence of the intrahepatic biliary ducts and common bile duct is noted. As previously suggested, most of the hypodensities within the liver likely reflect cysts, aside from a 9 mm mildly hypoattenuating focus near  the hepatic dome that may reflect a gradually enlarging metastasis.  The spleen is unremarkable in appearance. The adrenal glands are grossly unremarkable.  The kidneys are unremarkable in appearance. Minimal left-sided pelvicaliectasis remains within normal limits. Mild prominence of the ureters is relatively stable and likely reflects the patient's baseline. There is no evidence of hydronephrosis. No renal or ureteral stones are seen. No perinephric stranding is appreciated.  No free fluid is identified. The small bowel is unremarkable in appearance. The stomach is within normal limits. No acute vascular abnormalities are seen.  The appendix is normal in caliber, without evidence for appendicitis. The colon is largely filled with stool and is unremarkable in appearance. The sigmoid colon is somewhat redundant.  The bladder is mildly distended and grossly unremarkable. The prostate is normal in size. No inguinal lymphadenopathy is seen.  No acute osseous abnormalities are identified. There is chronic deformity involving the proximal right femur, with associated hardware.  IMPRESSION: 1. 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 Prominent collateral vessels about the hepatic hilum and at the tail of the pancreas drain the splenic vein and superior mesenteric venous branches.  This is stable from the prior study. No evidence of new portal venous thrombosis. 2. Abnormal soft tissue density tracking along the posterior aspect of the pancreatic body, encasing the celiac trunk. This is somewhat more diffuse than on the prior study, and reflects the patient's known pancreatic cancer. 3. Mildly worsened soft tissue inflammation extending along the left diaphragmatic crus, with a few scattered foci of soft tissue air at the medial left upper quadrant. On correlation with recent history, this reflects  the patient's recent celiac ganglion block procedure. 4. Mildly worsened prominence of the intrahepatic biliary ducts and common bile duct noted, concerning for mildly worsened obstruction. 5. Scattered hepatic cysts; 9 mm mildly hypoattenuating focus near the hepatic dome may reflect a gradually enlarging metastasis.   Electronically Signed   By: Garald Balding M.D.   On: 04/05/2014 02:22    Scheduled Meds: . amLODipine  10 mg Oral Daily  . DULoxetine  30 mg Oral Daily  . lidocaine-prilocaine   Topical Once  . morphine  60 mg Oral Q12H  . pantoprazole  40 mg Oral Daily  . potassium chloride SA  20 mEq Oral Daily  . rivaroxaban  20 mg Oral Q breakfast   Continuous Infusions:   Principal Problem:   Malignant neoplasm of pancreas Active Problems:   Hypertension   GERD (gastroesophageal reflux disease)   Splenic vein thrombosis   Intractable abdominal pain   Protein-calorie malnutrition, severe   Cancer related pain    Time spent: 25 minutes    Kelvin Cellar  [facility] Pager [phone]. If 7PM-7AM, please contact night-coverage at [email or web address], password [facility] 04/06/2014, 1:18 PM  LOS: 1 day

## 2014-04-06 NOTE — Consult Note (Signed)
Patient XB:MWUXLKG [name]      DOB: 1955/07/31      MWN:[id number]     Consult Note from the Palliative Medicine Team at [place] Requested by: Ray [name]     PCP: Ray Netters, MD Reason for Consultation:  Symptom managmetn     Phone Number:[phone]  Assessment of patients Current state:   59 yo male with h/o pancreatic cancer admitted with uncontrolled pain.  Under the care of Ray [name]/Oncologist for current cancer care/chemotherapy.  Had cervical plexus block one week ago without relief.     Consult is for symptom recommendation.  This NP Ray [name] reviewed medical records, received report from team, assessed the patient and then meet at the patient's bedside along with his wife  to discuss diagnosis and symptom management strategies.  A detailed discussion was had today regarding advanced directives.  Concepts specific to code status, artifical feeding and hydration, continued IV antibiotics and rehospitalization was had.  The difference between a aggressive medical intervention path  and a palliative comfort care path for this patient at this time was had.  Values and goals of care important to patient and family were attempted to be elicited.  Concept of  Palliative Care was discussed  Natural trajectory and expectations at EOL were discussed.  Questions and concerns addressed.  Hard Choices booklet left for review. Family encouraged to call with questions or concerns.  PMT will continue to support holistically.   Goals of Care: 1.  Code Status:  Full code   2. Scope of Treatment:  Patient is open to offered and available medical interventions to prolong life.  3. Disposition: Home when pain is controlled   4. Symptom Management:   1. Pain:        -MS CONTIN 60  mg po every 8 hr         -MSIR 30 mg po every 4 hrs prn        -Dilaudid 4 mg IV every 2 hrs for severe breakthough pain   2. Bowel Regimen:Senns 2 tablets qhs   Brief HPI:    59 yo male with h/o pancreatic cancer currently under treatment of chemotherapy with Ray Ray [name].  Difficult to control pain a had cervical plexis block ine week ago without relief. Admitted with worsening pain   ROS:  Abdominal pain   PMH:  Past Medical History  Diagnosis Date  . Blood transfusion   . GERD (gastroesophageal reflux disease)   . Hyperlipidemia   . Hypertension   . H/O hiatal hernia   . Arthritis     in his back, hips, knees, R shoulder   . Colon polyps     TUBULAR ADENOMA  . Splenic vein thrombosis   . Liver cyst   . Hx of pancreatitis   . Gout   . Pancreatic cancer      PSH: Past Surgical History  Procedure Laterality Date  . Hip surgery      x2 right hip- retained hardware ambulates with cane  . Shoulder surgery      right  . Hand surgery      left thumb tendon repair  . Femur hardware removal  2011    R femur- prothesis removed & replaced  . Posterior cervical fusion/foraminotomy N/A 04/20/2012    Procedure: POSTERIOR CERVICAL FUSION/FORAMINOTOMY LEVEL 1;  Surgeon: Ophelia Charter, MD;  Location: [place] [facility];  Service: Neurosurgery;  Laterality: N/A;  C1/2 laminectomies with posterior screw  fixation  . Colonoscopy w/ biopsies  04/15/2011  . Eus N/A 06/10/2013    Procedure: UPPER ENDOSCOPIC ULTRASOUND (EUS) LINEAR;  Surgeon: Milus Banister, MD;  Location: [facility];  Service: Endoscopy;  Laterality: N/A;  . Eus Bilateral 07/15/2013    Procedure: ESOPHAGEAL ENDOSCOPIC ULTRASOUND (EUS) RADIAL;  Surgeon: Milus Banister, MD;  Location: [facility];  Service: Endoscopy;  Laterality: Bilateral;  . Eus N/A 08/05/2013    Procedure: ESOPHAGEAL ENDOSCOPIC ULTRASOUND (EUS) RADIAL;  Surgeon: Milus Banister, MD;  Location: [facility];  Service: Endoscopy;  Laterality: N/A;  . Neurolytic celiac plexus N/A 08/05/2013    Procedure: NEUROLYTIC CELIAC PLEXUS;  Surgeon: Milus Banister, MD;  Location: [facility];  Service: Endoscopy;  Laterality: N/A;  . Eus N/A  03/31/2014    Procedure: UPPER ENDOSCOPIC ULTRASOUND (EUS) LINEAR;  Surgeon: Milus Banister, MD;  Location: [facility];  Service: Endoscopy;  Laterality: N/A;   celiac plexus neurolysis  . Neurolytic celiac plexus N/A 03/31/2014    Procedure: NEUROLYTIC CELIAC PLEXUS;  Surgeon: Milus Banister, MD;  Location: [facility];  Service: Endoscopy;  Laterality: N/A;   I have reviewed the [place] and SH and  If appropriate update it with new information. No Known Allergies Scheduled Meds: . amLODipine  10 mg Oral Daily  . DULoxetine  30 mg Oral Daily  . lidocaine-prilocaine   Topical Once  . morphine  75 mg Oral Q12H  . pantoprazole  40 mg Oral Daily  . potassium chloride SA  20 mEq Oral Daily  . rivaroxaban  20 mg Oral Q breakfast   Continuous Infusions:  PRN Meds:.acetaminophen **OR** acetaminophen, hydrALAZINE, HYDROmorphone (DILAUDID) injection, morphine, ondansetron **OR** ondansetron (ZOFRAN) IV, prochlorperazine, sorbitol    BP 133/96 mmHg  Pulse 82  Temp(Src) 98.5 F (36.9 C) (Oral)  Resp 18  Ht 6' (1.829 m)  Wt 74.4 kg (164 lb 0.4 oz)  BMI 22.24 kg/m2  SpO2 98%     Intake/Output Summary (Last 24 hours) at 04/06/14 1607 Last data filed at 04/06/14 1443  Gross per 24 hour  Intake   2652 ml  Output   1950 ml  Net    702 ml     Physical Exam:  General: alert and oriented,  HEENT:  Moist buccal membranes, no exudate Chest:   CTA CVS: RRR Abdomen: soft NT +BS   Labs: CBC    Component Value Date/Time   WBC 9.0 04/05/2014 0715   WBC 8.6 04/04/2014 0903   RBC 4.20* 04/05/2014 0715   RBC 4.68 04/04/2014 0903   HGB 11.6* 04/05/2014 0715   HGB 12.9* 04/04/2014 0903   HGB 13.5* 06/08/2013 1445   HCT 36.0* 04/05/2014 0715   HCT 40.5 04/04/2014 0903   PLT 154 04/05/2014 0715   PLT 156 04/04/2014 0903   MCV 85.7 04/05/2014 0715   MCV 86.6 04/04/2014 0903   MCH 27.6 04/05/2014 0715   MCH 27.5 04/04/2014 0903   MCHC 32.2 04/05/2014 0715   MCHC 31.8* 04/04/2014 0903    RDW 19.5* 04/05/2014 0715   RDW 20.4* 04/04/2014 0903   LYMPHSABS 1.7 04/05/2014 0715   LYMPHSABS 2.1 04/04/2014 0903   MONOABS 0.7 04/05/2014 0715   MONOABS 0.8 04/04/2014 0903   EOSABS 0.1 04/05/2014 0715   EOSABS 0.1 04/04/2014 0903   BASOSABS 0.0 04/05/2014 0715   BASOSABS 0.0 04/04/2014 0903    BMET    Component Value Date/Time   NA 137 04/05/2014 0715   NA 140 04/04/2014 0904  K 3.9 04/05/2014 0715   K 4.2 04/04/2014 0904   CL 100 04/05/2014 0715   CO2 29 04/05/2014 0715   CO2 24 04/04/2014 0904   GLUCOSE 97 04/05/2014 0715   GLUCOSE 111 04/04/2014 0904   BUN 9 04/05/2014 0715   BUN 10.8 04/04/2014 0904   CREATININE 0.63 04/05/2014 0715   CREATININE 1.0 04/04/2014 0904   CREATININE 1.09 01/21/2011 1042   CALCIUM 8.8 04/05/2014 0715   CALCIUM 9.2 04/04/2014 0904   GFRNONAA >90 04/05/2014 0715   GFRAA >90 04/05/2014 0715    CMP     Component Value Date/Time   NA 137 04/05/2014 0715   NA 140 04/04/2014 0904   K 3.9 04/05/2014 0715   K 4.2 04/04/2014 0904   CL 100 04/05/2014 0715   CO2 29 04/05/2014 0715   CO2 24 04/04/2014 0904   GLUCOSE 97 04/05/2014 0715   GLUCOSE 111 04/04/2014 0904   BUN 9 04/05/2014 0715   BUN 10.8 04/04/2014 0904   CREATININE 0.63 04/05/2014 0715   CREATININE 1.0 04/04/2014 0904   CREATININE 1.09 01/21/2011 1042   CALCIUM 8.8 04/05/2014 0715   CALCIUM 9.2 04/04/2014 0904   PROT 6.2 04/05/2014 0715   PROT 6.4 04/04/2014 0904   ALBUMIN 3.9 04/05/2014 0715   ALBUMIN 3.8 04/04/2014 0904   AST 10 04/05/2014 0715   AST 10 04/04/2014 0904   ALT 7 04/05/2014 0715   ALT <6 04/04/2014 0904   ALKPHOS 94 04/05/2014 0715   ALKPHOS 117 04/04/2014 0904   BILITOT 0.6 04/05/2014 0715   BILITOT 0.40 04/04/2014 0904   GFRNONAA >90 04/05/2014 0715   GFRAA >90 04/05/2014 0715     Time In Time Out Total Time Spent with Patient Total Overall Time  1600 1730 80 min 90    Greater than 50%  of this time was spent counseling and  coordinating care related to the above assessment and plan.   Ray Lessen NP  Palliative Medicine Team Team Phone # [phone] Pager [phone]  Discussed with Ray Celesta Aver and Ray Ray [name]

## 2014-04-06 NOTE — Progress Notes (Signed)
IP PROGRESS NOTE  Subjective:   Mr. Shorten reports good control of the abdominal pain with the current MS Contin/IV Dilaudid regimen. He is eating breakfast. No new complaint.  Objective: Vital signs in last 24 hours: Blood pressure 145/93, pulse 71, temperature 98.3 F (36.8 C), temperature source Oral, resp. rate 16, height 6' (1.829 m), weight 164 lb 0.4 oz (74.4 kg), SpO2 100 %.  Intake/Output from previous day: 03/01 0701 - 03/02 0700 In: 2772 [P.O.:960; I.V.:1812] Out: 2200 [Urine:2200]  Physical Exam:  Abdomen: No hepatomegaly, nontender, no mass, no apparent ascites Extremities: No leg edema   Portacath/PICC-without erythema  Lab Results:  Recent Labs  04/05/14 0004 04/05/14 0105 04/05/14 0715  WBC 8.1  --  9.0  HGB 12.3* 15.0 11.6*  HCT 38.8* 44.0 36.0*  PLT 171  --  154    BMET  Recent Labs  04/05/14 0004 04/05/14 0105 04/05/14 0715  NA 137 138 137  K 3.9 3.9 3.9  CL 100 97 100  CO2 28  --  29  GLUCOSE 114* 109* 97  BUN 10 8 9   CREATININE 0.75 0.70 0.63  CALCIUM 9.4  --  8.8    Studies/Results: Ct Abdomen Pelvis W Contrast  04/05/2014   CLINICAL DATA:  Acute onset of left upper quadrant abdominal pain. Known pancreatic cancer, status post chemotherapy. Assess for splenic vein thrombosis. Subsequent encounter.  EXAM: CT ABDOMEN AND PELVIS WITH CONTRAST  TECHNIQUE: Multidetector CT imaging of the abdomen and pelvis was performed using the standard protocol following bolus administration of intravenous contrast.  CONTRAST:  100 mL of Omnipaque 300 IV contrast  COMPARISON:  CT of the abdomen and pelvis performed 01/12/2014  FINDINGS: Minimal bibasilar atelectasis is noted.  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 Prominent collateral vessels are noted about the hepatic hilum and tail of the pancreas, draining the splenic vein and superior mesenteric  venous branches.  The appearance is unchanged from the prior study. No new portal venous thrombosis is seen.  Abnormal soft tissue density is again noted tracking along the posterior aspect of the pancreatic body, encasing the celiac trunk. This is somewhat more diffuse than on the prior study, and reflects the patient's known pancreatic cancer.  There is mildly increased soft tissue inflammation extending along the left diaphragmatic crus, with a few scattered foci of soft tissue air at the medial left upper quadrant. On correlation with the patient's recent history, this reflects the patient's recent celiac ganglion block procedure.  Mildly worsened prominence of the intrahepatic biliary ducts and common bile duct is noted. As previously suggested, most of the hypodensities within the liver likely reflect cysts, aside from a 9 mm mildly hypoattenuating focus near the hepatic dome that may reflect a gradually enlarging metastasis.  The spleen is unremarkable in appearance. The adrenal glands are grossly unremarkable.  The kidneys are unremarkable in appearance. Minimal left-sided pelvicaliectasis remains within normal limits. Mild prominence of the ureters is relatively stable and likely reflects the patient's baseline. There is no evidence of hydronephrosis. No renal or ureteral stones are seen. No perinephric stranding is appreciated.  No free fluid is identified. The small bowel is unremarkable in appearance. The stomach is within normal limits. No acute vascular abnormalities are seen.  The appendix is normal in caliber, without evidence for appendicitis. The colon is largely filled with stool and is unremarkable in appearance. The sigmoid colon is somewhat redundant.  The bladder is mildly distended and grossly unremarkable. The prostate is normal in size. No inguinal lymphadenopathy is seen.  No acute osseous abnormalities are identified. There is chronic deformity involving the proximal right femur, with  associated hardware.  IMPRESSION: 1. 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 Prominent collateral vessels about the hepatic hilum and at the tail of the pancreas drain the splenic vein and superior mesenteric venous branches. This is stable from the prior study. No evidence of new portal venous thrombosis. 2. Abnormal soft tissue density tracking along the posterior aspect of the pancreatic body, encasing the celiac trunk. This is somewhat more diffuse than on the prior study, and reflects the patient's known pancreatic cancer. 3. Mildly worsened soft tissue inflammation extending along the left diaphragmatic crus, with a few scattered foci of soft tissue air at the medial left upper quadrant. On correlation with recent history, this reflects the patient's recent celiac ganglion block procedure. 4. Mildly worsened prominence of the intrahepatic biliary ducts and common bile duct noted, concerning for mildly worsened obstruction. 5. Scattered hepatic cysts; 9 mm mildly hypoattenuating focus near the hepatic dome may reflect a gradually enlarging metastasis.   Electronically Signed   By: Garald Balding M.D.   On: 04/05/2014 02:22    Medications: I have reviewed the patient's current medications.  Assessment/Plan: 1. Pancreas cancer.  07/06/2013 ,CA 19-9- 2067.   CT abdomen/pelvis 07/09/2013 with interval development of amorphous soft tissue circumferentially encasing the celiac axis and tracking caudally down toward the SMA. Associated stable irregular mild distention of the main pancreatic duct in the body and tail of the pancreas with no discrete mass lesion seen. 2.2 x 1.4 cm portacaval lymph node in the hepatoduodenal ligament had increased in size from 1.3 x 1.1 cm previously. Other scattered small lymph nodes seen in the hepatoduodenal ligament.   Endoscopic ultrasound  07/15/2013 with a very vague masslike lesion in the region of the uncinate pancreas. Fine needle aspiration showed atypical cells consistent with adenocarcinoma.   PET scan 07/29/2013 with hypermetabolic soft tissue posterior to the pancreas with an adjacent hypermetabolic lymph node, no hypermetabolic pancreas mass, no evidence of distant metastatic disease   Cycle 1 FOLFIRINOX 08/02/2013.   Cycle 2 FOLFIRINOX 08/16/2013.   Cycle 3 FOLFIRINOX 09/06/2013.   Cycle 4 FOLFIRINOX 09/21/2013.   CA 19-9 improved on 10/04/2013.   Cycle 5 FOLFIRINOX 10/04/2013.   Cycle 6 FOLFIRINOX 10/18/2013   Restaging CT 10/22/2013 with no change in the soft tissue surrounding the pancreas/celiac axis, scattered indeterminate low-attenuation liver lesions are more conspicuous.  Cycle 7 FOLFIRINOX 11/08/2013  Cycle 8 FOLFIRINOX 11/22/2013  Cycle 9 FOLFIRINOX 12/06/2013  Cycle 10 FOLFIRI (oxaliplatin eliminated) 12/20/2013.  Cycle 11 FOLFIRI 01/03/2014  Restaging CT 01/12/2014-no change in the soft tissue posterior to the pancreas or an 8 mm hepatic metastasis  Cycle 12 FOLFIRI 01/17/2014.  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, soft tissue density encasing the celiac axis, a possible 9 mm hepatic metastasis 2. Abdominal pain secondary to #1. Progressive.  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  Status post celiac block 03/31/2014. 3. Hypertension. 4. History of gout. 5. Nausea and vomiting following FOLFIRINOX chemotherapy.  Aloxi and Emend added with cycle 2.   Decadron added with cycle 3. 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 7.  Hospitalization 08/24/2013 through 08/27/2013 with poorly controlled nausea/vomiting. Diarrhea during the hospitalization. 8. Skin hyperpigmentation and dryness secondary to 5-fluorouracil  Ray [name] appears  stable this morning. He should be okay for discharge later today if the breakthrough pain is controlled with an oral narcotic.  Recommendations: 1. Continue MS Contin 2. Try MSIR for breakthrough pain 3. Discharge to home later today if his pain is controlled with oral narcotics 4. Follow-up as scheduled at the Cancer center 04/07/2014    LOS: 1 day   [facility], Lakena Sparlin  04/06/2014, 8:46 AM

## 2014-04-07 ENCOUNTER — Telehealth: Payer: Self-pay | Admitting: Oncology

## 2014-04-07 ENCOUNTER — Other Ambulatory Visit: Payer: Self-pay | Admitting: Oncology

## 2014-04-07 ENCOUNTER — Ambulatory Visit: Payer: Medicare Other | Admitting: Nurse Practitioner

## 2014-04-07 ENCOUNTER — Other Ambulatory Visit: Payer: Self-pay | Admitting: Nurse Practitioner

## 2014-04-07 DIAGNOSIS — C259 Malignant neoplasm of pancreas, unspecified: Secondary | ICD-10-CM

## 2014-04-07 LAB — BASIC METABOLIC PANEL
ANION GAP: 7 (ref 5–15)
BUN: 10 mg/dL (ref 6–23)
CHLORIDE: 102 mmol/L (ref 96–112)
CO2: 31 mmol/L (ref 19–32)
Calcium: 8.7 mg/dL (ref 8.4–10.5)
Creatinine, Ser: 0.76 mg/dL (ref 0.50–1.35)
GFR calc Af Amer: >90 mL/min (ref >90)
GFR calc non Af Amer: >90 mL/min (ref >90)
Glucose, Bld: 99 mg/dL (ref 70–99)
POTASSIUM: 4.1 mmol/L (ref 3.5–5.1)
Sodium: 140 mmol/L (ref 135–145)

## 2014-04-07 LAB — CBC
HEMATOCRIT: 34.9 % — AB (ref 39.0–52.0)
HEMOGLOBIN: 11 g/dL — AB (ref 13.0–17.0)
MCH: 27.2 pg (ref 26.0–34.0)
MCHC: 31.5 g/dL (ref 30.0–36.0)
MCV: 86.4 fL (ref 78.0–100.0)
Platelets: 148 10*3/uL — ABNORMAL LOW (ref 150–400)
RBC: 4.04 MIL/uL — ABNORMAL LOW (ref 4.22–5.81)
RDW: 18.8 % — ABNORMAL HIGH (ref 11.5–15.5)
WBC: 6.4 10*3/uL (ref 4.0–10.5)

## 2014-04-07 MED ORDER — MORPHINE SULFATE ER 30 MG PO TBCR
90.0000 mg | EXTENDED_RELEASE_TABLET | Freq: Three times a day (TID) | ORAL | Status: DC
Start: 2014-04-07 — End: 2014-04-07

## 2014-04-07 MED ORDER — MORPHINE SULFATE ER 30 MG PO TBCR
30.0000 mg | EXTENDED_RELEASE_TABLET | Freq: Once | ORAL | Status: AC
  Administered 2014-04-07: 30 mg via ORAL

## 2014-04-07 MED ORDER — DEXAMETHASONE 4 MG PO TABS
4.0000 mg | ORAL_TABLET | Freq: Every day | ORAL | Status: DC
  Administered 2014-04-07 – 2014-04-08 (×2): 4 mg via ORAL
  Filled 2014-04-07 (×2): qty 1

## 2014-04-07 MED ORDER — MORPHINE SULFATE ER 30 MG PO TBCR
90.0000 mg | EXTENDED_RELEASE_TABLET | Freq: Three times a day (TID) | ORAL | Status: DC
  Administered 2014-04-07 – 2014-04-08 (×3): 90 mg via ORAL
  Filled 2014-04-07 (×3): qty 3

## 2014-04-07 NOTE — Care Management Note (Signed)
CARE MANAGEMENT NOTE 04/07/2014  Patient:  Ray [name], Ray [name]   Account Number:  [id number]  Date Initiated:  04/07/2014  Documentation initiated by:  Marney Doctor  Subjective/Objective Assessment:   59 yo admitted with Malignant neoplasm of the pancreas. history of pancreatic cancer on chemotherapy     Action/Plan:   From home with spouse   Anticipated DC Date:  04/09/2014   Anticipated DC Plan:  [place]  CM consult      Choice offered to / List presented to:             Status of service:  In process, will continue to follow Medicare Important Message given?   (If response is "NO", the following Medicare IM given date fields will be blank) Date Medicare IM given:   Medicare IM given by:   Date Additional Medicare IM given:   Additional Medicare IM given by:    Discharge Disposition:    Per UR Regulation:  Reviewed for med. necessity/level of care/duration of stay  If discussed at [place] of Stay Meetings, dates discussed:    Comments:  04/07/14 Marney Doctor RN,BSN,NCM [phone] Chart reviewed and CM following for DC needs.

## 2014-04-07 NOTE — Telephone Encounter (Signed)
Labs/ov/inj scheduled per 03/02, sent msg to add chemo and to give me a time for 03/07 and then I would add labs to it and contact pt confirming D/T for his 03/07 visit..... KJ

## 2014-04-07 NOTE — Progress Notes (Signed)
[facility] PROGRESS NOTE  Ray [name] JQB:[id number] DOB: 21-Jun-1955 DOA: 04/04/2014 PCP: Chrisandra Netters, MD  Assessment/Plan: 1. Intractable abdominal pain likely secondary to pancreatic cancer -Patient having a CT scan of abdomen and pelvis which did not reveal evidence of common bile duct obstruction or acute intra-abdominal infectious process. Findings appear to be chronic. -Pain likely related to underlying pancreatic malignancy. -Patient was initially started on MS Contin 60 mg by mouth twice a day with immediate release morphine 15-30 mg every 4 hours as needed for breakthrough pain. -Patient stating that his pain symptoms are still not controlled on oral narcotic analgesics -Palliative care consulted for assistance with pain management -MS Contin was increased to 90 mg PO q 8 hours -Will monitor over the next 24 hours adjusting pain meds, anticipate discharge in there next 24 hours  2.  Hypertension. -Patient's systolic blood pressures in the 120's to 130's  -Continue amlodipine 10 mg by mouth daily  3.  Splenic vein thrombosis -CT scan showing stable findings -Continue anticoagulation therapy with Xarelto  Code Status: Full code Family Communication:  Disposition Plan: Palliative care consultation for pain management, anticipate discharge in the next 24 hours   Consultants:  Medical oncology  Palliative care    HPI/Subjective: Patient is a pleasant 59 year old gentleman with a past medical history of pancreatic cancer, status post chemotherapy, admitted to the medicine service on 04/05/2014 presenting with increasing abdominal pain located in epigastric and left upper quadrant region. Initial workup included a CT scan of abdomen and pelvis with IV contrast performed on 04/05/2014 that showed splenic vein and superior mesenteric vein thrombosis stable from prior study, soft tissue density tracking along the posterior aspect of the pancreatic body encasing  the celiac trunk appear to be somewhat more diffuse than on the prior study and consistent with patient's known history of pancreatic cancer. Patient was seen and evaluated by Dr Learta Codding of medical oncology.  Objective: Filed Vitals:   04/07/14 1329  BP: 134/96  Pulse: 76  Temp: 98.5 F (36.9 C)  Resp: 18    Intake/Output Summary (Last 24 hours) at 04/07/14 1742 Last data filed at 04/07/14 1344  Gross per 24 hour  Intake   1200 ml  Output   1475 ml  Net   -275 ml   Filed Weights   04/05/14 0334 04/07/14 0601  Weight: 74.4 kg (164 lb 0.4 oz) 74.2 kg (163 lb 9.3 oz)    Exam:   General:  Patient reports ongoing pain located to epigastric and left upper quadrant region appears somewhat uncomfortable  Cardiovascular: Regular rate and rhythm normal S1-S2  Respiratory: Normal respiratory effort lungs overall clear to auscultation  Abdomen: Patient reporting pain over epigastric and left upper quadrant region, abdomen otherwise is nondistended no rebound tenderness or guarding  Musculoskeletal: No edema  Data Reviewed: Basic Metabolic Panel:  Recent Labs Lab 04/04/14 0904 04/05/14 0004 04/05/14 0105 04/05/14 0715 04/07/14 0400  NA 140 137 138 137 140  K 4.2 3.9 3.9 3.9 4.1  CL  --  100 97 100 102  CO2 24 28  --  29 31  GLUCOSE 111 114* 109* 97 99  BUN 10.8 10 8 9 10   CREATININE 1.0 0.75 0.70 0.63 0.76  CALCIUM 9.2 9.4  --  8.8 8.7   Liver Function Tests:  Recent Labs Lab 04/04/14 0904 04/05/14 0004 04/05/14 0715  AST 10 13 10   ALT 6 8 7   ALKPHOS 117 107 94  BILITOT 0.40 0.7 0.6  PROT 6.4 6.9 6.2  ALBUMIN 3.8 4.5 3.9    Recent Labs Lab 04/05/14 0004  LIPASE 15   No results for input(s): AMMONIA in the last 168 hours. CBC:  Recent Labs Lab 04/04/14 0903 04/05/14 0004 04/05/14 0105 04/05/14 0715 04/07/14 0400  WBC 8.6 8.1  --  9.0 6.4  NEUTROABS 5.6 6.6  --  6.6  --   HGB 12.9* 12.3* 15.0 11.6* 11.0*  HCT 40.5 38.8* 44.0 36.0* 34.9*  MCV  86.6 86.2  --  85.7 86.4  PLT 156 171  --  154 148*   Cardiac Enzymes: No results for input(s): CKTOTAL, CKMB, CKMBINDEX, TROPONINI in the last 168 hours. BNP (last 3 results) No results for input(s): BNP in the last 8760 hours.  ProBNP (last 3 results) No results for input(s): PROBNP in the last 8760 hours.  CBG: No results for input(s): GLUCAP in the last 168 hours.  No results found for this or any previous visit (from the past 240 hour(s)).   Studies: No results found.  Scheduled Meds: . amLODipine  10 mg Oral Daily  . dexamethasone  4 mg Oral Daily  . DULoxetine  30 mg Oral Daily  . lidocaine-prilocaine   Topical Once  . morphine  90 mg Oral Q8H  . pantoprazole  40 mg Oral Daily  . potassium chloride SA  20 mEq Oral Daily  . rivaroxaban  20 mg Oral Q breakfast   Continuous Infusions:   Principal Problem:   Malignant neoplasm of pancreas Active Problems:   Hypertension   GERD (gastroesophageal reflux disease)   Splenic vein thrombosis   Intractable abdominal pain   Protein-calorie malnutrition, severe   Cancer related pain    Time spent: 20 minutes    Kelvin Cellar  [facility] Pager [phone]. If 7PM-7AM, please contact night-coverage at [email or web address], password [facility] 04/07/2014, 5:42 PM  LOS: 2 days

## 2014-04-07 NOTE — Progress Notes (Signed)
IP PROGRESS NOTE  Subjective:   [name] reports the pain is better with the increased dose of MS Contin. He has required IV Dilaudid in frequently overnight.   Objective: Vital signs in last 24 hours: Blood pressure 134/96, pulse 76, temperature 98.5 F (36.9 C), temperature source Oral, resp. rate 18, height 6' (1.829 m), weight 163 lb 9.3 oz (74.2 kg), SpO2 100 %.  Intake/Output from previous day: 03/02 0701 - 03/03 0700 In: 1080 [P.O.:1080] Out: 1900 [Urine:1900]  Physical Exam: Lungs: Clear bilaterally Cardiac: Regular rate and rhythm Abdomen: No hepatomegaly, nontender, no mass, no apparent ascites Extremities: No leg edema   Portacath/PICC-without erythema  Lab Results:  Recent Labs  04/05/14 0715 04/07/14 0400  WBC 9.0 6.4  HGB 11.6* 11.0*  HCT 36.0* 34.9*  PLT 154 148*    BMET  Recent Labs  04/05/14 0715 04/07/14 0400  NA 137 140  K 3.9 4.1  CL 100 102  CO2 29 31  GLUCOSE 97 99  BUN 9 10  CREATININE 0.63 0.76  CALCIUM 8.8 8.7    Studies/Results: No results found.  Medications: I have reviewed the patient's current medications.  Assessment/Plan: 1. Pancreas cancer.  07/06/2013 ,CA 19-9- 2067.   CT abdomen/pelvis 07/09/2013 with interval development of amorphous soft tissue circumferentially encasing the celiac axis and tracking caudally down toward the SMA. Associated stable irregular mild distention of the main pancreatic duct in the body and tail of the pancreas with no discrete mass lesion seen. 2.2 x 1.4 cm portacaval lymph node in the hepatoduodenal ligament had increased in size from 1.3 x 1.1 cm previously. Other scattered small lymph nodes seen in the hepatoduodenal ligament.   Endoscopic ultrasound 07/15/2013 with a very vague masslike lesion in the region of the uncinate pancreas. Fine needle aspiration showed atypical cells consistent with adenocarcinoma.   PET scan 07/29/2013 with hypermetabolic soft tissue posterior to the  pancreas with an adjacent hypermetabolic lymph node, no hypermetabolic pancreas mass, no evidence of distant metastatic disease   Cycle 1 FOLFIRINOX 08/02/2013.   Cycle 2 FOLFIRINOX 08/16/2013.   Cycle 3 FOLFIRINOX 09/06/2013.   Cycle 4 FOLFIRINOX 09/21/2013.   CA 19-9 improved on 10/04/2013.   Cycle 5 FOLFIRINOX 10/04/2013.   Cycle 6 FOLFIRINOX 10/18/2013   Restaging CT 10/22/2013 with no change in the soft tissue surrounding the pancreas/celiac axis, scattered indeterminate low-attenuation liver lesions are more conspicuous.  Cycle 7 FOLFIRINOX 11/08/2013  Cycle 8 FOLFIRINOX 11/22/2013  Cycle 9 FOLFIRINOX 12/06/2013  Cycle 10 FOLFIRI (oxaliplatin eliminated) 12/20/2013.  Cycle 11 FOLFIRI 01/03/2014  Restaging CT 01/12/2014-no change in the soft tissue posterior to the pancreas or an 8 mm hepatic metastasis  Cycle 12 FOLFIRI 01/17/2014.  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, soft tissue density encasing the celiac axis, a possible 9 mm hepatic metastasis 2. Abdominal pain secondary to #1. Progressive.  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  Status post celiac block 03/31/2014. 3. Hypertension. 4. History of gout. 5. Nausea and vomiting following FOLFIRINOX chemotherapy.  Aloxi and Emend added with cycle 2.   Decadron added with cycle 3. 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 7. Hospitalization 08/24/2013 through 08/27/2013 with poorly controlled nausea/vomiting. Diarrhea during the hospitalization. 8. Skin hyperpigmentation and dryness secondary to 5-fluorouracil  [name] has improved pain control this morning. I suspect the pain is secondary to local progression of pancreas cancer. I reviewed  the CT images in radiology. The infiltrative process surrounding the celiac axis appears progressive compared to the previous  CT. We discussed treatment options. I recommend switching to a different chemotherapy regimen with gemcitabine/Abraxane. We reviewed the potential toxicities associated with this regimen including the chance for hematologic toxicity, nausea, rash, fever, and allergic reaction, and pneumonitis. We also discussed the chance of developing progressive neuropathy. He agrees to proceed.  Recommendations: 1. Continue MS Contin/MSIR for pain 2. Okay for discharge to home when pain controlled with an oral narcotic regimen 3. Follow-up at the [facility] 04/11/2014 for gemcitabine/Abraxane chemotherapy     LOS: 2 days   [place]  04/07/2014, 1:59 PM

## 2014-04-08 ENCOUNTER — Telehealth: Payer: Self-pay | Admitting: *Deleted

## 2014-04-08 ENCOUNTER — Telehealth: Payer: Self-pay | Admitting: Oncology

## 2014-04-08 ENCOUNTER — Other Ambulatory Visit: Payer: Self-pay | Admitting: Nurse Practitioner

## 2014-04-08 MED ORDER — MORPHINE SULFATE 30 MG PO TABS: 30.0000 mg | ORAL_TABLET | ORAL | Status: DC | PRN

## 2014-04-08 MED ORDER — MORPHINE SULFATE ER 30 MG PO TBCR: 90.0000 mg | EXTENDED_RELEASE_TABLET | Freq: Three times a day (TID) | ORAL | Status: DC

## 2014-04-08 NOTE — Telephone Encounter (Signed)
S/w pt confirming treatment for 03/07 per 03/04 POF and advised pt to p/u schedule at his next visit... KJ

## 2014-04-08 NOTE — Telephone Encounter (Signed)
Per staff message and POF I have scheduled appts. Advised scheduler of appts. JMW  

## 2014-04-08 NOTE — Telephone Encounter (Signed)
Call from pt requesting appts for next week's chemo treatment. Scheduler will follow up. Pt also reports insurance will not approve MS Contin refill early despite dose change. Pt will call office with pill count from his last fill. He was able to pick up MSIR today. Spoke with Marylyn Ishihara at [facility]. They were able to get approval. Will have Rx ready today.  Called pt with appt for 3/7 and informed him he can pick up his MS Contin refill. He voiced appreciation for call. Instructed him to pick up schedule after treatment 3/7.

## 2014-04-08 NOTE — Discharge Summary (Signed)
Physician Discharge Summary  Ray [name] YHC:[id number] DOB: October 22, 1955 DOA: 04/04/2014  PCP: Chrisandra Netters, MD  Admit date: 04/04/2014 Discharge date: 04/08/2014  Time spent: 35 minutes  Recommendations for Outpatient Follow-up:  1. Please follow up on patient's pain symptoms   Discharge Diagnoses:  Principal Problem:   Malignant neoplasm of pancreas Active Problems:   Hypertension   GERD (gastroesophageal reflux disease)   Splenic vein thrombosis   Intractable abdominal pain   Protein-calorie malnutrition, severe   Cancer related pain   Discharge Condition: Stable/Improved  Diet recommendation: Regular Diet  Filed Weights   04/05/14 0334 04/07/14 0601 04/08/14 0443  Weight: 74.4 kg (164 lb 0.4 oz) 74.2 kg (163 lb 9.3 oz) 74.481 kg (164 lb 3.2 oz)    History of present illness:  Ray [name] is a 59 y.o. male with known history of pancreatic cancer on chemotherapy, last chemotherapy was last month, had cervical plexus neurolysis last week by Dr. Ardis Hughs presents to the ER because of worsening pain over the last 5 days. Patient's pain is mostly in the epigastric and left upper quadrant. Denies any nausea vomiting or diarrhea and has been able to eat his regular food. Patient rates the pain as 8-9 out of 10.  Hospital Course:  Patient is a pleasant 59 year old gentleman with a past medical history of pancreatic cancer, status post chemotherapy, admitted to the medicine service on 04/05/2014 presenting with increasing abdominal pain located in epigastric and left upper quadrant region. 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 Patient was seen and evaluated by Dr Learta Codding of medical oncology.   Intractable abdominal pain likely secondary to pancreatic cancer -Patient having a CT scan of abdomen and pelvis which did not reveal evidence of common bile duct obstruction or acute intra-abdominal infectious process. Findings appear to be chronic. -Pain likely related to underlying pancreatic malignancy. -Palliative care consulted for assistance with pain management -His pain was controlled with MS Contin 90 mg PO q 8 hours and Morphine IR 30 mg PO q 4 hours as needed for severe breakthrough pain  2. Hypertension. -Patient's systolic blood pressures in the 120's to 130's  -Continue amlodipine 10 mg by mouth daily  3. Splenic vein thrombosis -CT scan showing stable findings -Continue anticoagulation therapy with Xarelto   Consultations:  Palliative Care  Med/Onc  Discharge Exam: Filed Vitals:   04/08/14 0443  BP: 133/83  Pulse: 99  Temp: 99 F (37.2 C)  Resp: 18    General: Patient reports ongoing pain located to epigastric and left upper quadrant region appears somewhat uncomfortable  Cardiovascular: Regular rate and rhythm normal S1-S2  Respiratory: Normal respiratory effort lungs overall clear to auscultation  Abdomen: Patient reporting improved pain over epigastric and left upper quadrant region, abdomen otherwise is nondistended no rebound tenderness or guarding  Musculoskeletal: No edema  Discharge Instructions   Discharge Instructions    Call MD for:  difficulty breathing, headache or visual disturbances    Complete by:  As directed      Call MD for:  extreme fatigue    Complete by:  As directed      Call MD for:  hives    Complete by:  As directed      Call MD for:  persistant dizziness or light-headedness    Complete by:  As directed      Call MD for:  persistant nausea and vomiting    Complete by:  As directed      Call MD for:  redness, tenderness, or  signs of infection (pain, swelling, redness, odor or green/yellow discharge around incision site)    Complete by:  As directed      Call MD for:  severe uncontrolled pain    Complete by:  As directed      Call MD for:  temperature >100.4    Complete by:  As directed      Diet - low sodium heart healthy    Complete by:  As directed      Increase activity slowly    Complete by:  As directed           Current Discharge Medication List    START taking these medications   Details  morphine (MSIR) 30 MG tablet Take 1 tablet (30 mg total) by mouth every 4 (four) hours as needed for moderate pain. Qty: 30 tablet, Refills: 0   Associated Diagnoses: Malignant neoplasm of pancreas, unspecified location of malignancy; Intractable abdominal pain      CONTINUE these medications which have CHANGED   Details  morphine (MS CONTIN) 30 MG 12 hr tablet Take 3 tablets (90 mg total) by mouth every 8 (eight) hours. Qty: 90 tablet, Refills: 0   Associated Diagnoses: Intractable abdominal pain      CONTINUE these medications which have NOT CHANGED   Details  amLODipine (NORVASC) 10 MG tablet Take 1 tablet (10 mg total) by mouth daily. Qty: 90 tablet, Refills: 1    DULoxetine (CYMBALTA) 30 MG capsule TAKE 1 CAPSULE DAILY Qty: 90 capsule, Refills: 0    lidocaine-prilocaine (EMLA) cream Apply over port area 1-2 hours prior to treatment and cover with plastic wrap.  DO NOT RUB IN. Qty: 30 g, Refills: PRN   Associated Diagnoses: Malignant neoplasm of colon    omeprazole (PRILOSEC) 40 MG capsule TAKE 1 CAPSULE DAILY Qty: 90 capsule, Refills: 3    potassium chloride SA (K-DUR,KLOR-CON) 20 MEQ tablet Take 1 tablet (20 mEq total) by mouth daily. Qty: 90 tablet, Refills: 2    prochlorperazine (COMPAZINE) 10 MG tablet Take 1 tablet (10 mg total) by mouth every 6 (six) hours as needed for nausea or vomiting. Qty: 60 tablet, Refills: 2   Associated Diagnoses: Malignant neoplasm of pancreas, unspecified  location of malignancy    rivaroxaban (XARELTO) 20 MG TABS tablet Take 1 tablet (20 mg total) by mouth every morning. Qty: 90 tablet, Refills: 2    dexamethasone (DECADRON) 4 MG tablet TAKE 2 TABLETS TWICE DAILY FOR 3 DAYS BEGINNING THE DAY AFTER CHEMO Qty: 24 tablet, Refills: 1    loperamide (IMODIUM) 2 MG capsule Take 1 capsule (2 mg total) by mouth as needed for diarrhea or loose stools. Maximum 8 tabs per day Qty: 30 capsule, Refills: 2   Associated Diagnoses: Malignant neoplasm of pancreas, unspecified location of malignancy    Polyethyl Glycol-Propyl Glycol (SYSTANE) 0.4-0.3 % SOLN Apply 1 drop to eye 3 (three) times daily as needed (dry eyes).    sorbitol 70 % SOLN Take 15 ml - 30 ml by mouth 2 (two) times a days as needed for constipation Qty: 1000 mL, Refills: 1    VIAGRA 50  MG tablet TAKE 1 TABLET AS NEEDED Qty: 18 tablet, Refills: 2      STOP taking these medications     HYDROmorphone (DILAUDID) 4 MG tablet      oxyCODONE-acetaminophen (PERCOCET) 10-325 MG per tablet        No Known Allergies Follow-up Information    Follow up with Chrisandra Netters, MD In 1 week.   Specialty:  Family Medicine   Contact information:   [place] [address] [phone]       Follow up with Betsy Coder, MD In 1 week.   Specialty:  Oncology   Contact information:   [place] [id number] [phone]        The results of significant diagnostics from this hospitalization (including imaging, microbiology, ancillary and laboratory) are listed below for reference.    Significant Diagnostic Studies: Ct Abdomen Pelvis W Contrast  04/05/2014   CLINICAL DATA:  Acute onset of left upper quadrant abdominal pain. Known pancreatic cancer, status post chemotherapy. Assess for splenic vein thrombosis. Subsequent encounter.  EXAM: CT ABDOMEN AND PELVIS WITH CONTRAST  TECHNIQUE: Multidetector CT imaging of the abdomen and pelvis was  performed using the standard protocol following bolus administration of intravenous contrast.  CONTRAST:  100 mL of Omnipaque 300 IV contrast  COMPARISON:  CT of the abdomen and pelvis performed 01/12/2014  FINDINGS: Minimal bibasilar atelectasis is noted.  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 Prominent collateral vessels are noted about the hepatic hilum and tail of the pancreas, draining the splenic vein and superior mesenteric venous branches.  The appearance is unchanged from the prior study. No new portal venous thrombosis is seen.  Abnormal soft tissue density is again noted tracking along the posterior aspect of the pancreatic body, encasing the celiac trunk. This is somewhat more diffuse than on the prior study, and reflects the patient's known pancreatic cancer.  There is mildly increased soft tissue inflammation extending along the left diaphragmatic crus, with a few scattered foci of soft tissue air at the medial left upper quadrant. On correlation with the patient's recent history, this reflects the patient's recent celiac ganglion block procedure.  Mildly worsened prominence of the intrahepatic biliary ducts and common bile duct is noted. As previously suggested, most of the hypodensities within the liver likely reflect cysts, aside from a 9 mm mildly hypoattenuating focus near the hepatic dome that may reflect a gradually enlarging metastasis.  The spleen is unremarkable in appearance. The adrenal glands are grossly unremarkable.  The kidneys are unremarkable in appearance. Minimal left-sided pelvicaliectasis remains within normal limits. Mild prominence of the ureters is relatively stable and likely reflects the patient's baseline. There is no evidence of hydronephrosis. No renal or ureteral stones are seen. No perinephric stranding is appreciated.  No free fluid is identified. The small bowel  is unremarkable in appearance. The stomach is within normal limits. No acute vascular abnormalities are seen.  The appendix is normal in caliber, without evidence for appendicitis. The colon is largely filled with stool and is unremarkable in appearance. The sigmoid colon is somewhat redundant.  The bladder is mildly distended and grossly unremarkable. The prostate is normal in size. No inguinal lymphadenopathy is seen.  No acute osseous abnormalities are identified. There is chronic deformity involving the proximal right femur, with associated hardware.  IMPRESSION: 1. Changes of remote splenic vein and superior mesenteric vein thrombosis again  noted, with subsequent collateralization and reconstitution of the portal venous system. Prominent collateral vessels about the hepatic hilum and at the tail of the pancreas drain the splenic vein and superior mesenteric venous branches. This is stable from the prior study. No evidence of new portal venous thrombosis. 2. Abnormal soft tissue density tracking along the posterior aspect of the pancreatic body, encasing the celiac trunk. This is somewhat more diffuse than on the prior study, and reflects the patient's known pancreatic cancer. 3. Mildly worsened soft tissue inflammation extending along the left diaphragmatic crus, with a few scattered foci of soft tissue air at the medial left upper quadrant. On correlation with recent history, this reflects the patient's recent celiac ganglion block procedure. 4. Mildly worsened prominence of the intrahepatic biliary ducts and common bile duct noted, concerning for mildly worsened obstruction. 5. Scattered hepatic cysts; 9 mm mildly hypoattenuating focus near the hepatic dome may reflect a gradually enlarging metastasis.   Electronically Signed   By: Garald Balding M.D.   On: 04/05/2014 02:22    Microbiology: No results found for this or any previous visit (from the past 240 hour(s)).   Labs: Basic Metabolic  Panel:  Recent Labs Lab 04/04/14 0904 04/05/14 0004 04/05/14 0105 04/05/14 0715 04/07/14 0400  NA 140 137 138 137 140  K 4.2 3.9 3.9 3.9 4.1  CL  --  100 97 100 102  CO2 24 28  --  29 31  GLUCOSE 111 114* 109* 97 99  BUN 10.8 10 8 9 10   CREATININE 1.0 0.75 0.70 0.63 0.76  CALCIUM 9.2 9.4  --  8.8 8.7   Liver Function Tests:  Recent Labs Lab 04/04/14 0904 04/05/14 0004 04/05/14 0715  AST 10 13 10   ALT 6 8 7   ALKPHOS 117 107 94  BILITOT 0.40 0.7 0.6  PROT 6.4 6.9 6.2  ALBUMIN 3.8 4.5 3.9    Recent Labs Lab 04/05/14 0004  LIPASE 15   No results for input(s): AMMONIA in the last 168 hours. CBC:  Recent Labs Lab 04/04/14 0903 04/05/14 0004 04/05/14 0105 04/05/14 0715 04/07/14 0400  WBC 8.6 8.1  --  9.0 6.4  NEUTROABS 5.6 6.6  --  6.6  --   HGB 12.9* 12.3* 15.0 11.6* 11.0*  HCT 40.5 38.8* 44.0 36.0* 34.9*  MCV 86.6 86.2  --  85.7 86.4  PLT 156 171  --  154 148*   Cardiac Enzymes: No results for input(s): CKTOTAL, CKMB, CKMBINDEX, TROPONINI in the last 168 hours. BNP: BNP (last 3 results) No results for input(s): BNP in the last 8760 hours.  ProBNP (last 3 results) No results for input(s): PROBNP in the last 8760 hours.  CBG: No results for input(s): GLUCAP in the last 168 hours.     SignedKelvin Cellar  [facility] 04/08/2014, 8:57 AM

## 2014-04-08 NOTE — Care Management Note (Signed)
Medicare Important Message given?  YES (If response is "NO", the following Medicare IM given date fields will be blank) Date Medicare IM given:  04/08/2014 Medicare IM given by:  Marney Doctor

## 2014-04-10 ENCOUNTER — Other Ambulatory Visit: Payer: Self-pay | Admitting: Oncology

## 2014-04-11 ENCOUNTER — Ambulatory Visit (HOSPITAL_BASED_OUTPATIENT_CLINIC_OR_DEPARTMENT_OTHER): Payer: Medicare Other

## 2014-04-11 DIAGNOSIS — Z5111 Encounter for antineoplastic chemotherapy: Secondary | ICD-10-CM

## 2014-04-11 DIAGNOSIS — C787 Secondary malignant neoplasm of liver and intrahepatic bile duct: Secondary | ICD-10-CM | POA: Diagnosis not present

## 2014-04-11 DIAGNOSIS — C251 Malignant neoplasm of body of pancreas: Secondary | ICD-10-CM

## 2014-04-11 DIAGNOSIS — R109 Unspecified abdominal pain: Secondary | ICD-10-CM

## 2014-04-11 DIAGNOSIS — Z8719 Personal history of other diseases of the digestive system: Secondary | ICD-10-CM

## 2014-04-11 DIAGNOSIS — C252 Malignant neoplasm of tail of pancreas: Secondary | ICD-10-CM

## 2014-04-11 DIAGNOSIS — C259 Malignant neoplasm of pancreas, unspecified: Secondary | ICD-10-CM

## 2014-04-11 MED ORDER — SODIUM CHLORIDE 0.9 % IV SOLN
Freq: Once | INTRAVENOUS | Status: AC
  Administered 2014-04-11: 15:00:00 via INTRAVENOUS
  Filled 2014-04-11: qty 4

## 2014-04-11 MED ORDER — SODIUM CHLORIDE 0.9 % IV SOLN
Freq: Once | INTRAVENOUS | Status: AC
  Administered 2014-04-11: 15:00:00 via INTRAVENOUS

## 2014-04-11 MED ORDER — ONDANSETRON 8 MG/50ML IVPB (CHCC): 8.0000 mg | Freq: Once | INTRAVENOUS | Status: DC

## 2014-04-11 MED ORDER — MORPHINE SULFATE 30 MG PO TABS: 30.0000 mg | ORAL_TABLET | ORAL | Status: DC | PRN

## 2014-04-11 MED ORDER — HEPARIN SOD (PORK) LOCK FLUSH 100 UNIT/ML IV SOLN
500.0000 [IU] | Freq: Once | INTRAVENOUS | Status: AC | PRN
  Administered 2014-04-11: 500 [IU]
  Filled 2014-04-11: qty 5

## 2014-04-11 MED ORDER — SODIUM CHLORIDE 0.9 % IJ SOLN
10.0000 mL | INTRAMUSCULAR | Status: DC | PRN
  Administered 2014-04-11: 10 mL
  Filled 2014-04-11: qty 10

## 2014-04-11 MED ORDER — PACLITAXEL PROTEIN-BOUND CHEMO INJECTION 100 MG
100.0000 mg/m2 | Freq: Once | INTRAVENOUS | Status: AC
  Administered 2014-04-11: 200 mg via INTRAVENOUS
  Filled 2014-04-11: qty 40

## 2014-04-11 MED ORDER — GEMCITABINE HCL CHEMO INJECTION 1 GM/26.3ML
800.0000 mg/m2 | Freq: Once | INTRAVENOUS | Status: AC
  Administered 2014-04-11: 1558 mg via INTRAVENOUS
  Filled 2014-04-11: qty 40.98

## 2014-04-11 MED ORDER — DEXAMETHASONE SODIUM PHOSPHATE 10 MG/ML IJ SOLN: 10.0000 mg | Freq: Once | INTRAMUSCULAR | Status: DC

## 2014-04-11 NOTE — Progress Notes (Signed)
Per [name] ok to use labs from 04/08/2014 hospital admission.  Pt does not need to be seen today to proceed with chemo

## 2014-04-11 NOTE — Patient Instructions (Signed)
Gemcitabine injection What is this medicine? GEMCITABINE (jem SIT a been) is a chemotherapy drug. This medicine is used to treat many types of cancer like breast cancer, lung cancer, pancreatic cancer, and ovarian cancer. This medicine may be used for other purposes; ask your health care provider or pharmacist if you have questions. COMMON BRAND NAME(S): Gemzar What should I tell my health care provider before I take this medicine? They need to know if you have any of these conditions: -blood disorders -infection -kidney disease -liver disease -recent or ongoing radiation therapy -an unusual or allergic reaction to gemcitabine, other chemotherapy, other medicines, foods, dyes, or preservatives -pregnant or trying to get pregnant -breast-feeding How should I use this medicine? This drug is given as an infusion into a vein. It is administered in a hospital or clinic by a specially trained health care professional. Talk to your pediatrician regarding the use of this medicine in children. Special care may be needed. Overdosage: If you think you have taken too much of this medicine contact a poison control center or emergency room at once. NOTE: This medicine is only for you. Do not share this medicine with others. What if I miss a dose? It is important not to miss your dose. Call your doctor or health care professional if you are unable to keep an appointment. What may interact with this medicine? -medicines to increase blood counts like filgrastim, pegfilgrastim, sargramostim -some other chemotherapy drugs like cisplatin -vaccines Talk to your doctor or health care professional before taking any of these medicines: -acetaminophen -aspirin -ibuprofen -ketoprofen -naproxen This list may not describe all possible interactions. Give your health care provider a list of all the medicines, herbs, non-prescription drugs, or dietary supplements you use. Also tell them if you smoke, drink alcohol,  or use illegal drugs. Some items may interact with your medicine. What should I watch for while using this medicine? Visit your doctor for checks on your progress. This drug may make you feel generally unwell. This is not uncommon, as chemotherapy can affect healthy cells as well as cancer cells. Report any side effects. Continue your course of treatment even though you feel ill unless your doctor tells you to stop. In some cases, you may be given additional medicines to help with side effects. Follow all directions for their use. Call your doctor or health care professional for advice if you get a fever, chills or sore throat, or other symptoms of a cold or flu. Do not treat yourself. This drug decreases your body's ability to fight infections. Try to avoid being around people who are sick. This medicine may increase your risk to bruise or bleed. Call your doctor or health care professional if you notice any unusual bleeding. Be careful brushing and flossing your teeth or using a toothpick because you may get an infection or bleed more easily. If you have any dental work done, tell your dentist you are receiving this medicine. Avoid taking products that contain aspirin, acetaminophen, ibuprofen, naproxen, or ketoprofen unless instructed by your doctor. These medicines may hide a fever. Women should inform their doctor if they wish to become pregnant or think they might be pregnant. There is a potential for serious side effects to an unborn child. Talk to your health care professional or pharmacist for more information. Do not breast-feed an infant while taking this medicine. What side effects may I notice from receiving this medicine? Side effects that you should report to your doctor or health care professional as   soon as possible: -allergic reactions like skin rash, itching or hives, swelling of the face, lips, or tongue -low blood counts - this medicine may decrease the number of white blood cells,  red blood cells and platelets. You may be at increased risk for infections and bleeding. -signs of infection - fever or chills, cough, sore throat, pain or difficulty passing urine -signs of decreased platelets or bleeding - bruising, pinpoint red spots on the skin, black, tarry stools, blood in the urine -signs of decreased red blood cells - unusually weak or tired, fainting spells, lightheadedness -breathing problems -chest pain -mouth sores -nausea and vomiting -pain, swelling, redness at site where injected -pain, tingling, numbness in the hands or feet -stomach pain -swelling of ankles, feet, hands -unusual bleeding Side effects that usually do not require medical attention (report to your doctor or health care professional if they continue or are bothersome): -constipation -diarrhea -hair loss -loss of appetite -stomach upset This list may not describe all possible side effects. Call your doctor for medical advice about side effects. You may report side effects to FDA at 1-800-FDA-1088. Where should I keep my medicine? This drug is given in a hospital or clinic and will not be stored at home. NOTE: This sheet is a summary. It may not cover all possible information. If you have questions about this medicine, talk to your doctor, pharmacist, or health care provider.  2015, Elsevier/Gold Standard. (2007-06-02 18:45:54) Nanoparticle Albumin-Bound Paclitaxel injection What is this medicine? NANOPARTICLE ALBUMIN-BOUND PACLITAXEL (Na no PAHR ti kuhl al BYOO muhn-bound PAK li TAX el) is a chemotherapy drug. It targets fast dividing cells, like cancer cells, and causes these cells to die. This medicine is used to treat advanced breast cancer and advanced lung cancer. This medicine may be used for other purposes; ask your health care provider or pharmacist if you have questions. COMMON BRAND NAME(S): Abraxane What should I tell my health care provider before I take this medicine? They need  to know if you have any of these conditions: -kidney disease -liver disease -low blood counts, like low platelets, red blood cells, or white blood cells -recent or ongoing radiation therapy -an unusual or allergic reaction to paclitaxel, albumin, other chemotherapy, other medicines, foods, dyes, or preservatives -pregnant or trying to get pregnant -breast-feeding How should I use this medicine? This drug is given as an infusion into a vein. It is administered in a hospital or clinic by a specially trained health care professional. Talk to your pediatrician regarding the use of this medicine in children. Special care may be needed. Overdosage: If you think you have taken too much of this medicine contact a poison control center or emergency room at once. NOTE: This medicine is only for you. Do not share this medicine with others. What if I miss a dose? It is important not to miss your dose. Call your doctor or health care professional if you are unable to keep an appointment. What may interact with this medicine? -cyclosporine -diazepam -ketoconazole -medicines to increase blood counts like filgrastim, pegfilgrastim, sargramostim -other chemotherapy drugs like cisplatin, doxorubicin, epirubicin, etoposide, teniposide, vincristine -quinidine -testosterone -vaccines -verapamil Talk to your doctor or health care professional before taking any of these medicines: -acetaminophen -aspirin -ibuprofen -ketoprofen -naproxen This list may not describe all possible interactions. Give your health care provider a list of all the medicines, herbs, non-prescription drugs, or dietary supplements you use. Also tell them if you smoke, drink alcohol, or use illegal drugs. Some items may  interact with your medicine. What should I watch for while using this medicine? Your condition will be monitored carefully while you are receiving this medicine. You will need important blood work done while you are  taking this medicine. This drug may make you feel generally unwell. This is not uncommon, as chemotherapy can affect healthy cells as well as cancer cells. Report any side effects. Continue your course of treatment even though you feel ill unless your doctor tells you to stop. In some cases, you may be given additional medicines to help with side effects. Follow all directions for their use. Call your doctor or health care professional for advice if you get a fever, chills or sore throat, or other symptoms of a cold or flu. Do not treat yourself. This drug decreases your body's ability to fight infections. Try to avoid being around people who are sick. This medicine may increase your risk to bruise or bleed. Call your doctor or health care professional if you notice any unusual bleeding. Be careful brushing and flossing your teeth or using a toothpick because you may get an infection or bleed more easily. If you have any dental work done, tell your dentist you are receiving this medicine. Avoid taking products that contain aspirin, acetaminophen, ibuprofen, naproxen, or ketoprofen unless instructed by your doctor. These medicines may hide a fever. Do not become pregnant while taking this medicine. Women should inform their doctor if they wish to become pregnant or think they might be pregnant. There is a potential for serious side effects to an unborn child. Talk to your health care professional or pharmacist for more information. Do not breast-feed an infant while taking this medicine. Men are advised not to father a child while receiving this medicine. What side effects may I notice from receiving this medicine? Side effects that you should report to your doctor or health care professional as soon as possible: -allergic reactions like skin rash, itching or hives, swelling of the face, lips, or tongue -low blood counts - This drug may decrease the number of white blood cells, red blood cells and  platelets. You may be at increased risk for infections and bleeding. -signs of infection - fever or chills, cough, sore throat, pain or difficulty passing urine -signs of decreased platelets or bleeding - bruising, pinpoint red spots on the skin, black, tarry stools, nosebleeds -signs of decreased red blood cells - unusually weak or tired, fainting spells, lightheadedness -breathing problems -changes in vision -chest pain -high or low blood pressure -mouth sores -nausea and vomiting -pain, swelling, redness or irritation at the injection site -pain, tingling, numbness in the hands or feet -slow or irregular heartbeat -swelling of the ankle, feet, hands Side effects that usually do not require medical attention (report to your doctor or health care professional if they continue or are bothersome): -aches, pains -changes in the color of fingernails -diarrhea -hair loss -loss of appetite This list may not describe all possible side effects. Call your doctor for medical advice about side effects. You may report side effects to FDA at 1-800-FDA-1088. Where should I keep my medicine? This drug is given in a hospital or clinic and will not be stored at home. NOTE: This sheet is a summary. It may not cover all possible information. If you have questions about this medicine, talk to your doctor, pharmacist, or health care provider.  2015, Elsevier/Gold Standard. (2012-03-16 16:48:50)

## 2014-04-12 ENCOUNTER — Telehealth: Payer: Self-pay | Admitting: *Deleted

## 2014-04-12 NOTE — Telephone Encounter (Signed)
Called patient to follow up on 1st Gemzar. Pt states he did well. Denies N/V or diarrhea. Confirmed next appts

## 2014-04-12 NOTE — Telephone Encounter (Signed)
-----   Message from Doroteo Bradford, RN sent at 04/11/2014  2:38 PM EST ----- Regarding: chemo followup [phone]

## 2014-04-13 ENCOUNTER — Other Ambulatory Visit: Payer: Self-pay | Admitting: Family Medicine

## 2014-04-18 ENCOUNTER — Telehealth: Payer: Self-pay | Admitting: *Deleted

## 2014-04-18 ENCOUNTER — Ambulatory Visit: Admitting: Oncology

## 2014-04-18 ENCOUNTER — Inpatient Hospital Stay

## 2014-04-18 ENCOUNTER — Other Ambulatory Visit (HOSPITAL_BASED_OUTPATIENT_CLINIC_OR_DEPARTMENT_OTHER): Payer: Medicare Other

## 2014-04-18 ENCOUNTER — Ambulatory Visit (HOSPITAL_BASED_OUTPATIENT_CLINIC_OR_DEPARTMENT_OTHER): Payer: Medicare Other

## 2014-04-18 ENCOUNTER — Ambulatory Visit (HOSPITAL_BASED_OUTPATIENT_CLINIC_OR_DEPARTMENT_OTHER): Payer: Medicare Other | Admitting: Oncology

## 2014-04-18 ENCOUNTER — Other Ambulatory Visit: Payer: Self-pay | Admitting: *Deleted

## 2014-04-18 ENCOUNTER — Telehealth: Payer: Self-pay | Admitting: Oncology

## 2014-04-18 ENCOUNTER — Other Ambulatory Visit

## 2014-04-18 VITALS — BP 122/82 | HR 79 | Temp 98.7°F | Resp 18 | Ht 72.0 in | Wt 167.6 lb

## 2014-04-18 DIAGNOSIS — C189 Malignant neoplasm of colon, unspecified: Secondary | ICD-10-CM

## 2014-04-18 DIAGNOSIS — I82890 Acute embolism and thrombosis of other specified veins: Secondary | ICD-10-CM

## 2014-04-18 DIAGNOSIS — C252 Malignant neoplasm of tail of pancreas: Secondary | ICD-10-CM

## 2014-04-18 DIAGNOSIS — Z5111 Encounter for antineoplastic chemotherapy: Secondary | ICD-10-CM | POA: Diagnosis not present

## 2014-04-18 DIAGNOSIS — C257 Malignant neoplasm of other parts of pancreas: Secondary | ICD-10-CM

## 2014-04-18 DIAGNOSIS — C251 Malignant neoplasm of body of pancreas: Secondary | ICD-10-CM

## 2014-04-18 DIAGNOSIS — C259 Malignant neoplasm of pancreas, unspecified: Secondary | ICD-10-CM

## 2014-04-18 DIAGNOSIS — Z8719 Personal history of other diseases of the digestive system: Secondary | ICD-10-CM

## 2014-04-18 DIAGNOSIS — R109 Unspecified abdominal pain: Secondary | ICD-10-CM

## 2014-04-18 LAB — CBC WITH DIFFERENTIAL/PLATELET
BASO%: 0.2 % (ref 0.0–2.0)
Basophils Absolute: 0 10*3/uL (ref 0.0–0.1)
EOS%: 0 % (ref 0.0–7.0)
Eosinophils Absolute: 0 10*3/uL (ref 0.0–0.5)
HCT: 35.1 % — ABNORMAL LOW (ref 38.4–49.9)
HGB: 11.3 g/dL — ABNORMAL LOW (ref 13.0–17.1)
LYMPH#: 1.7 10*3/uL (ref 0.9–3.3)
LYMPH%: 33.1 % (ref 14.0–49.0)
MCH: 27.8 pg (ref 27.2–33.4)
MCHC: 32.2 g/dL (ref 32.0–36.0)
MCV: 86.2 fL (ref 79.3–98.0)
MONO#: 0.3 10*3/uL (ref 0.1–0.9)
MONO%: 6.1 % (ref 0.0–14.0)
NEUT#: 3.2 10*3/uL (ref 1.5–6.5)
NEUT%: 60.6 % (ref 39.0–75.0)
Platelets: 96 10*3/uL — ABNORMAL LOW (ref 140–400)
RBC: 4.07 10*6/uL — ABNORMAL LOW (ref 4.20–5.82)
RDW: 17.8 % — ABNORMAL HIGH (ref 11.0–14.6)
WBC: 5.2 10*3/uL (ref 4.0–10.3)

## 2014-04-18 LAB — COMPREHENSIVE METABOLIC PANEL (CC13)
ALT: 11 U/L (ref 0–55)
ANION GAP: 10 meq/L (ref 3–11)
AST: 8 U/L (ref 5–34)
Albumin: 3.6 g/dL (ref 3.5–5.0)
Alkaline Phosphatase: 86 U/L (ref 40–150)
BUN: 9.7 mg/dL (ref 7.0–26.0)
CO2: 25 meq/L (ref 22–29)
CREATININE: 0.8 mg/dL (ref 0.7–1.3)
Calcium: 8.8 mg/dL (ref 8.4–10.4)
Chloride: 104 mEq/L (ref 98–109)
EGFR: >90 mL/min/{1.73_m2} (ref >90)
Glucose: 122 mg/dl (ref 70–140)
Potassium: 4 mEq/L (ref 3.5–5.1)
Sodium: 139 mEq/L (ref 136–145)
Total Bilirubin: 0.33 mg/dL (ref 0.20–1.20)
Total Protein: 6.3 g/dL — ABNORMAL LOW (ref 6.4–8.3)

## 2014-04-18 LAB — CANCER ANTIGEN 19-9: CA 19-9: 1730.5 U/mL — ABNORMAL HIGH (ref <35.0)

## 2014-04-18 MED ORDER — SODIUM CHLORIDE 0.9 % IV SOLN
Freq: Once | INTRAVENOUS | Status: AC
  Administered 2014-04-18: 11:00:00 via INTRAVENOUS

## 2014-04-18 MED ORDER — SODIUM CHLORIDE 0.9 % IJ SOLN
10.0000 mL | INTRAMUSCULAR | Status: DC | PRN
  Administered 2014-04-18: 10 mL
  Filled 2014-04-18: qty 10

## 2014-04-18 MED ORDER — SODIUM CHLORIDE 0.9 % IV SOLN
Freq: Once | INTRAVENOUS | Status: AC
  Administered 2014-04-18: 11:00:00 via INTRAVENOUS
  Filled 2014-04-18: qty 4

## 2014-04-18 MED ORDER — PACLITAXEL PROTEIN-BOUND CHEMO INJECTION 100 MG
100.0000 mg/m2 | Freq: Once | INTRAVENOUS | Status: AC
  Administered 2014-04-18: 200 mg via INTRAVENOUS
  Filled 2014-04-18: qty 40

## 2014-04-18 MED ORDER — MORPHINE SULFATE ER 30 MG PO TBCR: 90.0000 mg | EXTENDED_RELEASE_TABLET | Freq: Three times a day (TID) | ORAL | Status: DC

## 2014-04-18 MED ORDER — HEPARIN SOD (PORK) LOCK FLUSH 100 UNIT/ML IV SOLN
500.0000 [IU] | Freq: Once | INTRAVENOUS | Status: AC | PRN
  Administered 2014-04-18: 500 [IU]
  Filled 2014-04-18: qty 5

## 2014-04-18 MED ORDER — SODIUM CHLORIDE 0.9 % IV SOLN
800.0000 mg/m2 | Freq: Once | INTRAVENOUS | Status: AC
  Administered 2014-04-18: 1558 mg via INTRAVENOUS
  Filled 2014-04-18: qty 40.98

## 2014-04-18 MED ORDER — LIDOCAINE-PRILOCAINE 2.5-2.5 % EX CREA: TOPICAL_CREAM | CUTANEOUS | Status: DC

## 2014-04-18 NOTE — Progress Notes (Signed)
[facility] OFFICE PROGRESS NOTE   Diagnosis: Pancreas cancer  INTERVAL HISTORY:   [facility] [name] returns as scheduled. He reports good pain control with the current narcotic regimen. He completed a first treatment with gemcitabine/Abraxane on 04/11/2014. He tolerated the chemotherapy well. No new complaint.  Objective:  Vital signs in last 24 hours:  Blood pressure 122/82, pulse 79, temperature 98.7 F (37.1 C), temperature source Oral, resp. rate 18, height 6' (1.829 m), weight 167 lb 9.6 oz (76.023 kg), SpO2 100 %.    HEENT: No thrush or ulcers Resp: Lungs clear bilaterally Cardio: Regular rate and rhythm GI: No hepatomegaly, nontender, no mass Vascular: No leg edema  Portacath/PICC-without erythema  Lab Results:  Lab Results  Component Value Date   WBC 5.2 04/18/2014   HGB 11.3* 04/18/2014   HCT 35.1* 04/18/2014   MCV 86.2 04/18/2014   PLT 96* 04/18/2014   NEUTROABS 3.2 04/18/2014     Medications: I have reviewed the patient's current medications.  Assessment/Plan: 1. Pancreas cancer.  07/06/2013 ,CA 19-9- 2067.   CT abdomen/pelvis 07/09/2013 with interval development of amorphous soft tissue circumferentially encasing the celiac axis and tracking caudally down toward the SMA. Associated stable irregular mild distention of the main pancreatic duct in the body and tail of the pancreas with no discrete mass lesion seen. 2.2 x 1.4 cm portacaval lymph node in the hepatoduodenal ligament had increased in size from 1.3 x 1.1 cm previously. Other scattered small lymph nodes seen in the hepatoduodenal ligament.   Endoscopic ultrasound 07/15/2013 with a very vague masslike lesion in the region of the uncinate pancreas. Fine needle aspiration showed atypical cells consistent with adenocarcinoma.   PET scan 07/29/2013 with hypermetabolic soft tissue posterior to the pancreas with an adjacent hypermetabolic lymph node, no hypermetabolic pancreas mass, no  evidence of distant metastatic disease   Cycle 1 FOLFIRINOX 08/02/2013.   Cycle 2 FOLFIRINOX 08/16/2013.   Cycle 3 FOLFIRINOX 09/06/2013.   Cycle 4 FOLFIRINOX 09/21/2013.   CA 19-9 improved on 10/04/2013.   Cycle 5 FOLFIRINOX 10/04/2013.   Cycle 6 FOLFIRINOX 10/18/2013   Restaging CT 10/22/2013 with no change in the soft tissue surrounding the pancreas/celiac axis, scattered indeterminate low-attenuation liver lesions are more conspicuous.  Cycle 7 FOLFIRINOX 11/08/2013  Cycle 8 FOLFIRINOX 11/22/2013  Cycle 9 FOLFIRINOX 12/06/2013  Cycle 10 FOLFIRI (oxaliplatin eliminated) 12/20/2013.  Cycle 11 FOLFIRI 01/03/2014  Restaging CT 01/12/2014-no change in the soft tissue posterior to the pancreas or an 8 mm hepatic metastasis  Cycle 12 FOLFIRI 01/17/2014.  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 Abdominal pain secondary to #1. Progressive.  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  Status post celiac block 03/31/2014. 3. Hypertension. 4. History of gout. 5. Nausea and vomiting following FOLFIRINOX chemotherapy.  Aloxi and Emend added with cycle 2.   Decadron added with cycle 3. 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 7. Hospitalization 08/24/2013 through 08/27/2013 with poorly controlled nausea/vomiting. Diarrhea during the hospitalization. 8. Skin hyperpigmentation and dryness secondary to 5-fluorouracil    Disposition:  [facility] [name] tolerated the first treatment with gemcitabine/Abraxane well. He will complete day 8 chemotherapy today. He will contact us for spontaneous bleeding or bruising. His pain is under good control with the current narcotic  regimen. He will  continue MS Contin/MSIR.  He will return for an office visit and chemotherapy on 05/02/2014.  Betsy Coder, MD  04/18/2014  2:35 PM

## 2014-04-18 NOTE — Telephone Encounter (Signed)
Pt confirmed labs/ov per 03/14 POF, gave pt AVS.... KJ, sent msg to move chemo up and add chemo to sch.Marland KitchenMarland Kitchen

## 2014-04-18 NOTE — Telephone Encounter (Signed)
I have adjusted 3/28 appt

## 2014-04-18 NOTE — Patient Instructions (Signed)
[facility] Discharge Instructions for Patients Receiving Chemotherapy  Today you received the following chemotherapy agents abraxane/gemzar   To help prevent nausea and vomiting after your treatment, we encourage you to take your nausea medication as directed.   If you develop nausea and vomiting that is not controlled by your nausea medication, call the clinic.   BELOW ARE SYMPTOMS THAT SHOULD BE REPORTED IMMEDIATELY:  *FEVER GREATER THAN 100.5 F  *CHILLS WITH OR WITHOUT FEVER  NAUSEA AND VOMITING THAT IS NOT CONTROLLED WITH YOUR NAUSEA MEDICATION  *UNUSUAL SHORTNESS OF BREATH  *UNUSUAL BRUISING OR BLEEDING  TENDERNESS IN MOUTH AND THROAT WITH OR WITHOUT PRESENCE OF ULCERS  *URINARY PROBLEMS  *BOWEL PROBLEMS  UNUSUAL RASH Items with * indicate a potential emergency and should be followed up as soon as possible.  Feel free to call the clinic you have any questions or concerns. The clinic phone number is [phone].  

## 2014-04-18 NOTE — Progress Notes (Signed)
Ok to treat with platelets of 96 per Dr [name].

## 2014-04-18 NOTE — Progress Notes (Signed)
Per [name]; Faythe Ghee to treat with Platelets 96.

## 2014-04-19 DIAGNOSIS — Z515 Encounter for palliative care: Secondary | ICD-10-CM

## 2014-04-19 DIAGNOSIS — G893 Neoplasm related pain (acute) (chronic): Secondary | ICD-10-CM

## 2014-04-19 DIAGNOSIS — C259 Malignant neoplasm of pancreas, unspecified: Secondary | ICD-10-CM | POA: Insufficient documentation

## 2014-04-20 ENCOUNTER — Encounter

## 2014-04-20 ENCOUNTER — Ambulatory Visit: Payer: Medicare Other

## 2014-05-02 ENCOUNTER — Ambulatory Visit: Payer: Medicare Other | Admitting: Oncology

## 2014-05-02 ENCOUNTER — Ambulatory Visit (HOSPITAL_BASED_OUTPATIENT_CLINIC_OR_DEPARTMENT_OTHER): Payer: Medicare Other | Admitting: Oncology

## 2014-05-02 ENCOUNTER — Other Ambulatory Visit: Payer: Self-pay | Admitting: Oncology

## 2014-05-02 ENCOUNTER — Other Ambulatory Visit: Payer: Medicare Other

## 2014-05-02 ENCOUNTER — Ambulatory Visit (HOSPITAL_BASED_OUTPATIENT_CLINIC_OR_DEPARTMENT_OTHER): Payer: Medicare Other

## 2014-05-02 ENCOUNTER — Other Ambulatory Visit (HOSPITAL_BASED_OUTPATIENT_CLINIC_OR_DEPARTMENT_OTHER): Payer: Medicare Other

## 2014-05-02 ENCOUNTER — Encounter: Payer: Self-pay | Admitting: Oncology

## 2014-05-02 VITALS — BP 144/71 | HR 84 | Temp 98.4°F | Resp 18 | Ht 72.0 in | Wt 169.9 lb

## 2014-05-02 DIAGNOSIS — C787 Secondary malignant neoplasm of liver and intrahepatic bile duct: Secondary | ICD-10-CM | POA: Diagnosis not present

## 2014-05-02 DIAGNOSIS — C259 Malignant neoplasm of pancreas, unspecified: Secondary | ICD-10-CM

## 2014-05-02 DIAGNOSIS — Z8719 Personal history of other diseases of the digestive system: Secondary | ICD-10-CM

## 2014-05-02 DIAGNOSIS — R109 Unspecified abdominal pain: Secondary | ICD-10-CM | POA: Diagnosis not present

## 2014-05-02 DIAGNOSIS — Z5111 Encounter for antineoplastic chemotherapy: Secondary | ICD-10-CM

## 2014-05-02 LAB — COMPREHENSIVE METABOLIC PANEL (CC13)
ALBUMIN: 3.2 g/dL — AB (ref 3.5–5.0)
ALT: 8 U/L (ref 0–55)
ANION GAP: 12 meq/L — AB (ref 3–11)
AST: 9 U/L (ref 5–34)
Alkaline Phosphatase: 99 U/L (ref 40–150)
BUN: 5.6 mg/dL — AB (ref 7.0–26.0)
CALCIUM: 8.3 mg/dL — AB (ref 8.4–10.4)
CHLORIDE: 106 meq/L (ref 98–109)
CO2: 24 mEq/L (ref 22–29)
Creatinine: 0.8 mg/dL (ref 0.7–1.3)
GLUCOSE: 135 mg/dL (ref 70–140)
Potassium: 3.4 mEq/L — ABNORMAL LOW (ref 3.5–5.1)
Sodium: 142 mEq/L (ref 136–145)
Total Bilirubin: 0.21 mg/dL (ref 0.20–1.20)
Total Protein: 6.1 g/dL — ABNORMAL LOW (ref 6.4–8.3)

## 2014-05-02 LAB — CBC WITH DIFFERENTIAL/PLATELET
BASO%: 0 % (ref 0.0–2.0)
Basophils Absolute: 0 10*3/uL (ref 0.0–0.1)
EOS%: 0.5 % (ref 0.0–7.0)
Eosinophils Absolute: 0 10*3/uL (ref 0.0–0.5)
HCT: 30.6 % — ABNORMAL LOW (ref 38.4–49.9)
HGB: 10 g/dL — ABNORMAL LOW (ref 13.0–17.1)
LYMPH#: 1.3 10*3/uL (ref 0.9–3.3)
LYMPH%: 30.5 % (ref 14.0–49.0)
MCH: 27.6 pg (ref 27.2–33.4)
MCHC: 32.7 g/dL (ref 32.0–36.0)
MCV: 84.5 fL (ref 79.3–98.0)
MONO#: 0.6 10*3/uL (ref 0.1–0.9)
MONO%: 14.6 % — ABNORMAL HIGH (ref 0.0–14.0)
NEUT%: 54.4 % (ref 39.0–75.0)
NEUTROS ABS: 2.3 10*3/uL (ref 1.5–6.5)
Platelets: 160 10*3/uL (ref 140–400)
RBC: 3.62 10*6/uL — ABNORMAL LOW (ref 4.20–5.82)
RDW: 17.2 % — AB (ref 11.0–14.6)
WBC: 4.2 10*3/uL (ref 4.0–10.3)

## 2014-05-02 MED ORDER — SODIUM CHLORIDE 0.9 % IV SOLN
800.0000 mg/m2 | Freq: Once | INTRAVENOUS | Status: AC
  Administered 2014-05-02: 1558 mg via INTRAVENOUS
  Filled 2014-05-02: qty 40.98

## 2014-05-02 MED ORDER — SODIUM CHLORIDE 0.9 % IJ SOLN
10.0000 mL | INTRAMUSCULAR | Status: DC | PRN
  Administered 2014-05-02: 10 mL
  Filled 2014-05-02: qty 10

## 2014-05-02 MED ORDER — SODIUM CHLORIDE 0.9 % IV SOLN
Freq: Once | INTRAVENOUS | Status: AC
  Administered 2014-05-02: 11:00:00 via INTRAVENOUS

## 2014-05-02 MED ORDER — HEPARIN SOD (PORK) LOCK FLUSH 100 UNIT/ML IV SOLN
500.0000 [IU] | Freq: Once | INTRAVENOUS | Status: AC | PRN
  Administered 2014-05-02: 500 [IU]
  Filled 2014-05-02: qty 5

## 2014-05-02 MED ORDER — SODIUM CHLORIDE 0.9 % IV SOLN
Freq: Once | INTRAVENOUS | Status: AC
  Administered 2014-05-02: 12:00:00 via INTRAVENOUS
  Filled 2014-05-02: qty 4

## 2014-05-02 MED ORDER — PACLITAXEL PROTEIN-BOUND CHEMO INJECTION 100 MG
100.0000 mg/m2 | Freq: Once | INTRAVENOUS | Status: AC
  Administered 2014-05-02: 200 mg via INTRAVENOUS
  Filled 2014-05-02: qty 40

## 2014-05-02 MED ORDER — MORPHINE SULFATE ER 30 MG PO TBCR: 90.0000 mg | EXTENDED_RELEASE_TABLET | Freq: Three times a day (TID) | ORAL | Status: DC

## 2014-05-02 NOTE — Progress Notes (Signed)
[facility] OFFICE PROGRESS NOTE   Diagnosis: Pancreas cancer  INTERVAL HISTORY:   [facility] [name] returns as scheduled. He reports good pain control with the current narcotic regimen. He completed a first cycle with gemcitabine/Abraxane on 04/18/2014. He tolerated the chemotherapy well. No new complaint. Request refill on his MS Contin. Received only 90 tabs with last refill, but takes 3 tabs every 8 hours or 9 tabs per day. Uses breakthrough pain only intermittently.  Objective:  Vital signs in last 24 hours:  Blood pressure 144/71, pulse 84, temperature 98.4 F (36.9 C), temperature source Oral, resp. rate 18, height 6' (1.829 m), weight 169 lb 14.4 oz (77.066 kg), SpO2 100 %.    HEENT: No thrush or ulcers Resp: Lungs clear bilaterally Cardio: Regular rate and rhythm GI: No hepatomegaly, nontender, no mass Vascular: No leg edema  Portacath/PICC-without erythema  Lab Results:  Lab Results  Component Value Date   WBC 4.2 05/02/2014   HGB 10.0* 05/02/2014   HCT 30.6* 05/02/2014   MCV 84.5 05/02/2014   PLT 160 05/02/2014   NEUTROABS 2.3 05/02/2014     Medications: I have reviewed the patient's current medications.  Assessment/Plan: 1. Pancreas cancer.  07/06/2013 ,CA 19-9- 2067.   CT abdomen/pelvis 07/09/2013 with interval development of amorphous soft tissue circumferentially encasing the celiac axis and tracking caudally down toward the SMA. Associated stable irregular mild distention of the main pancreatic duct in the body and tail of the pancreas with no discrete mass lesion seen. 2.2 x 1.4 cm portacaval lymph node in the hepatoduodenal ligament had increased in size from 1.3 x 1.1 cm previously. Other scattered small lymph nodes seen in the hepatoduodenal ligament.   Endoscopic ultrasound 07/15/2013 with a very vague masslike lesion in the region of the uncinate pancreas. Fine needle aspiration showed atypical cells consistent with adenocarcinoma.    PET scan 07/29/2013 with hypermetabolic soft tissue posterior to the pancreas with an adjacent hypermetabolic lymph node, no hypermetabolic pancreas mass, no evidence of distant metastatic disease   Cycle 1 FOLFIRINOX 08/02/2013.   Cycle 2 FOLFIRINOX 08/16/2013.   Cycle 3 FOLFIRINOX 09/06/2013.   Cycle 4 FOLFIRINOX 09/21/2013.   CA 19-9 improved on 10/04/2013.   Cycle 5 FOLFIRINOX 10/04/2013.   Cycle 6 FOLFIRINOX 10/18/2013   Restaging CT 10/22/2013 with no change in the soft tissue surrounding the pancreas/celiac axis, scattered indeterminate low-attenuation liver lesions are more conspicuous.  Cycle 7 FOLFIRINOX 11/08/2013  Cycle 8 FOLFIRINOX 11/22/2013  Cycle 9 FOLFIRINOX 12/06/2013  Cycle 10 FOLFIRI (oxaliplatin eliminated) 12/20/2013.  Cycle 11 FOLFIRI 01/03/2014  Restaging CT 01/12/2014-no change in the soft tissue posterior to the pancreas or an 8 mm hepatic metastasis  Cycle 12 FOLFIRI 01/17/2014.  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 Abdominal pain secondary to #1. Progressive.  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  Status post celiac block 03/31/2014. 3. Hypertension. 4. History of gout. 5. Nausea and vomiting following FOLFIRINOX chemotherapy.  Aloxi and Emend added with cycle 2.   Decadron added with cycle 3. 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 7. Hospitalization 08/24/2013 through 08/27/2013 with poorly controlled nausea/vomiting. Diarrhea during the hospitalization. 8. Skin hyperpigmentation and dryness secondary to 5-fluorouracil    Disposition:  [facility] [name] tolerated the first  treatment with gemcitabine/Abraxane well. He will proceed  with cycle 2 day 1 today. His pain is under good control with the current narcotic regimen. He will continue MS Contin/MSIR. I have refilled hi MSContin 30 mg tabs; take 3 tabs every 8 hours, #270 with no refills.  He will return on 05/09/14 for lab and cycle 2 day 8 and for an office visit and chemotherapy on 05/23/2014.  Mikey Bussing, DNP, AGPCNP-BC, AOCNP  05/02/2014  2:41 PM

## 2014-05-02 NOTE — Patient Instructions (Signed)
[facility] Discharge Instructions for Patients Receiving Chemotherapy  Today you received the following chemotherapy agents Abraxane, Gemzar  To help prevent nausea and vomiting after your treatment, we encourage you to take your nausea medication as directed/prescribed If you develop nausea and vomiting that is not controlled by your nausea medication, call the clinic.   BELOW ARE SYMPTOMS THAT SHOULD BE REPORTED IMMEDIATELY:  *FEVER GREATER THAN 100.5 F  *CHILLS WITH OR WITHOUT FEVER  NAUSEA AND VOMITING THAT IS NOT CONTROLLED WITH YOUR NAUSEA MEDICATION  *UNUSUAL SHORTNESS OF BREATH  *UNUSUAL BRUISING OR BLEEDING  TENDERNESS IN MOUTH AND THROAT WITH OR WITHOUT PRESENCE OF ULCERS  *URINARY PROBLEMS  *BOWEL PROBLEMS  UNUSUAL RASH Items with * indicate a potential emergency and should be followed up as soon as possible.  Feel free to call the clinic you have any questions or concerns. The clinic phone number is [phone].  Please show the [facility] at check-in to the Emergency Department and triage nurse.

## 2014-05-03 LAB — CANCER ANTIGEN 19-9: CA 19-9: 1331.7 U/mL — ABNORMAL HIGH (ref <35.0)

## 2014-05-04 ENCOUNTER — Ambulatory Visit: Payer: Medicare Other

## 2014-05-08 ENCOUNTER — Other Ambulatory Visit: Payer: Self-pay | Admitting: Nurse Practitioner

## 2014-05-08 ENCOUNTER — Other Ambulatory Visit: Payer: Self-pay | Admitting: Oncology

## 2014-05-09 ENCOUNTER — Ambulatory Visit (HOSPITAL_BASED_OUTPATIENT_CLINIC_OR_DEPARTMENT_OTHER): Payer: Medicare Other

## 2014-05-09 ENCOUNTER — Other Ambulatory Visit (HOSPITAL_BASED_OUTPATIENT_CLINIC_OR_DEPARTMENT_OTHER): Payer: Medicare Other

## 2014-05-09 DIAGNOSIS — C259 Malignant neoplasm of pancreas, unspecified: Secondary | ICD-10-CM

## 2014-05-09 DIAGNOSIS — C787 Secondary malignant neoplasm of liver and intrahepatic bile duct: Secondary | ICD-10-CM | POA: Diagnosis not present

## 2014-05-09 DIAGNOSIS — Z5111 Encounter for antineoplastic chemotherapy: Secondary | ICD-10-CM | POA: Diagnosis not present

## 2014-05-09 DIAGNOSIS — Z8719 Personal history of other diseases of the digestive system: Secondary | ICD-10-CM

## 2014-05-09 LAB — CBC WITH DIFFERENTIAL/PLATELET
BASO%: 0.3 % (ref 0.0–2.0)
Basophils Absolute: 0 10*3/uL (ref 0.0–0.1)
EOS ABS: 0 10*3/uL (ref 0.0–0.5)
EOS%: 0.1 % (ref 0.0–7.0)
HCT: 31.7 % — ABNORMAL LOW (ref 38.4–49.9)
HEMOGLOBIN: 10 g/dL — AB (ref 13.0–17.1)
LYMPH%: 41.3 % (ref 14.0–49.0)
MCH: 26.4 pg — ABNORMAL LOW (ref 27.2–33.4)
MCHC: 31.5 g/dL — ABNORMAL LOW (ref 32.0–36.0)
MCV: 83.8 fL (ref 79.3–98.0)
MONO#: 0.5 10*3/uL (ref 0.1–0.9)
MONO%: 10.2 % (ref 0.0–14.0)
NEUT%: 48.1 % (ref 39.0–75.0)
NEUTROS ABS: 2.3 10*3/uL (ref 1.5–6.5)
Platelets: 306 10*3/uL (ref 140–400)
RBC: 3.78 10*6/uL — ABNORMAL LOW (ref 4.20–5.82)
RDW: 17.9 % — AB (ref 11.0–14.6)
WBC: 4.9 10*3/uL (ref 4.0–10.3)
lymph#: 2 10*3/uL (ref 0.9–3.3)

## 2014-05-09 LAB — COMPREHENSIVE METABOLIC PANEL (CC13)
ALBUMIN: 3.3 g/dL — AB (ref 3.5–5.0)
ALK PHOS: 95 U/L (ref 40–150)
ALT: 8 U/L (ref 0–55)
AST: 11 U/L (ref 5–34)
Anion Gap: 11 mEq/L (ref 3–11)
BILIRUBIN TOTAL: 0.21 mg/dL (ref 0.20–1.20)
BUN: 4.9 mg/dL — AB (ref 7.0–26.0)
CO2: 27 mEq/L (ref 22–29)
Calcium: 8.7 mg/dL (ref 8.4–10.4)
Chloride: 104 mEq/L (ref 98–109)
Creatinine: 0.8 mg/dL (ref 0.7–1.3)
EGFR: >90 mL/min/{1.73_m2} (ref >90)
Glucose: 128 mg/dl (ref 70–140)
POTASSIUM: 4.2 meq/L (ref 3.5–5.1)
Sodium: 142 mEq/L (ref 136–145)
Total Protein: 6.2 g/dL — ABNORMAL LOW (ref 6.4–8.3)

## 2014-05-09 LAB — TECHNOLOGIST REVIEW

## 2014-05-09 MED ORDER — HEPARIN SOD (PORK) LOCK FLUSH 100 UNIT/ML IV SOLN
500.0000 [IU] | Freq: Once | INTRAVENOUS | Status: AC | PRN
  Administered 2014-05-09: 500 [IU]
  Filled 2014-05-09: qty 5

## 2014-05-09 MED ORDER — PACLITAXEL PROTEIN-BOUND CHEMO INJECTION 100 MG
100.0000 mg/m2 | Freq: Once | INTRAVENOUS | Status: AC
  Administered 2014-05-09: 200 mg via INTRAVENOUS
  Filled 2014-05-09: qty 40

## 2014-05-09 MED ORDER — SODIUM CHLORIDE 0.9 % IJ SOLN
10.0000 mL | INTRAMUSCULAR | Status: DC | PRN
  Administered 2014-05-09: 10 mL
  Filled 2014-05-09: qty 10

## 2014-05-09 MED ORDER — SODIUM CHLORIDE 0.9 % IV SOLN
Freq: Once | INTRAVENOUS | Status: AC
  Administered 2014-05-09: 09:00:00 via INTRAVENOUS

## 2014-05-09 MED ORDER — SODIUM CHLORIDE 0.9 % IV SOLN
800.0000 mg/m2 | Freq: Once | INTRAVENOUS | Status: AC
  Administered 2014-05-09: 1558 mg via INTRAVENOUS
  Filled 2014-05-09: qty 40.98

## 2014-05-09 MED ORDER — SODIUM CHLORIDE 0.9 % IV SOLN
Freq: Once | INTRAVENOUS | Status: AC
  Administered 2014-05-09: 10:00:00 via INTRAVENOUS
  Filled 2014-05-09: qty 4

## 2014-05-09 NOTE — Patient Instructions (Signed)
[facility] Discharge Instructions for Patients Receiving Chemotherapy  Today you received the following chemotherapy agents: Abraxane and Gemzar   To help prevent nausea and vomiting after your treatment, we encourage you to take your nausea medication as directed.    If you develop nausea and vomiting that is not controlled by your nausea medication, call the clinic.   BELOW ARE SYMPTOMS THAT SHOULD BE REPORTED IMMEDIATELY:  *FEVER GREATER THAN 100.5 F  *CHILLS WITH OR WITHOUT FEVER  NAUSEA AND VOMITING THAT IS NOT CONTROLLED WITH YOUR NAUSEA MEDICATION  *UNUSUAL SHORTNESS OF BREATH  *UNUSUAL BRUISING OR BLEEDING  TENDERNESS IN MOUTH AND THROAT WITH OR WITHOUT PRESENCE OF ULCERS  *URINARY PROBLEMS  *BOWEL PROBLEMS  UNUSUAL RASH Items with * indicate a potential emergency and should be followed up as soon as possible.  Feel free to call the clinic you have any questions or concerns. The clinic phone number is [phone].  Please show the CHEMO ALERT CARD at check-in to the Emergency Department and triage nurse.   

## 2014-05-11 ENCOUNTER — Ambulatory Visit: Payer: Medicare Other

## 2014-05-22 ENCOUNTER — Other Ambulatory Visit: Payer: Self-pay | Admitting: Oncology

## 2014-05-23 ENCOUNTER — Ambulatory Visit: Payer: Medicare Other

## 2014-05-23 ENCOUNTER — Other Ambulatory Visit: Payer: Self-pay | Admitting: *Deleted

## 2014-05-23 ENCOUNTER — Other Ambulatory Visit: Payer: Medicare Other

## 2014-05-23 ENCOUNTER — Ambulatory Visit: Payer: Medicare Other | Admitting: Oncology

## 2014-05-23 ENCOUNTER — Telehealth: Payer: Self-pay | Admitting: Oncology

## 2014-05-23 NOTE — Telephone Encounter (Signed)
Left message to confirm appointment for 04/25.

## 2014-05-26 ENCOUNTER — Telehealth: Payer: Self-pay

## 2014-05-26 ENCOUNTER — Telehealth: Payer: Self-pay | Admitting: *Deleted

## 2014-05-26 ENCOUNTER — Ambulatory Visit

## 2014-05-26 ENCOUNTER — Other Ambulatory Visit: Payer: Self-pay | Admitting: *Deleted

## 2014-05-26 NOTE — Telephone Encounter (Signed)
Attempted to call pt in response to message from wife. No answer. Left message requesting return call. Also informed him appt times for 4/25 may change due to missed office visit this week.

## 2014-05-26 NOTE — Telephone Encounter (Signed)
Wife returned call, attempted to transfer to [place]. lvm with [name] that wife returned call.

## 2014-05-26 NOTE — Progress Notes (Signed)
Presents today chief complaint of painful elongated toenails.  Objective: Pulses are palpable bilateral nails are thick, yellow dystrophic onychomycosis and painful palpation.   Assessment: Onychomycosis with pain in limb.  Plan: Treatment of nails in thickness and length as covered service secondary to pain.  

## 2014-05-26 NOTE — Telephone Encounter (Signed)
Received call from wife [name] re:  Pt has been eating canned fruit, and drinking lots of fluids, and taking Morphine both long acting and short acting as prescribed.  Bowel and bladder function fine.  However, pt still complained of abdominal pain.  [name] wanted to know if there is anything else [name] can prescribe to help with pt's abdominal pain. [name] stated pt missed appt with [name] on 05/23/14 due to pt not feeling up to coming in for treatment.   Messaage sent to [name] and [name], desk nurse today. Ray [name]'s  Phone    [phone].

## 2014-05-26 NOTE — Telephone Encounter (Signed)
Spoke with pt's wife, offered office visit for 4/22 to evaluate worsening pain. She discussed with pt, he declined. Requests office visit prior to infusion on 4/25. Order to schedulers to add office visit.

## 2014-05-29 ENCOUNTER — Other Ambulatory Visit: Payer: Self-pay | Admitting: Oncology

## 2014-05-29 ENCOUNTER — Other Ambulatory Visit: Payer: Self-pay | Admitting: Family Medicine

## 2014-05-30 ENCOUNTER — Ambulatory Visit (HOSPITAL_BASED_OUTPATIENT_CLINIC_OR_DEPARTMENT_OTHER): Payer: Medicare Other

## 2014-05-30 ENCOUNTER — Ambulatory Visit: Payer: Medicare Other

## 2014-05-30 ENCOUNTER — Ambulatory Visit (HOSPITAL_BASED_OUTPATIENT_CLINIC_OR_DEPARTMENT_OTHER): Payer: Medicare Other | Admitting: Nurse Practitioner

## 2014-05-30 ENCOUNTER — Other Ambulatory Visit: Payer: Medicare Other

## 2014-05-30 ENCOUNTER — Other Ambulatory Visit (HOSPITAL_BASED_OUTPATIENT_CLINIC_OR_DEPARTMENT_OTHER): Payer: Medicare Other

## 2014-05-30 ENCOUNTER — Telehealth: Payer: Self-pay | Admitting: Nurse Practitioner

## 2014-05-30 VITALS — BP 132/79 | HR 76 | Temp 98.3°F | Resp 18 | Ht 72.0 in | Wt 170.9 lb

## 2014-05-30 DIAGNOSIS — Z5111 Encounter for antineoplastic chemotherapy: Secondary | ICD-10-CM | POA: Diagnosis present

## 2014-05-30 DIAGNOSIS — C787 Secondary malignant neoplasm of liver and intrahepatic bile duct: Secondary | ICD-10-CM

## 2014-05-30 DIAGNOSIS — R109 Unspecified abdominal pain: Secondary | ICD-10-CM

## 2014-05-30 DIAGNOSIS — C259 Malignant neoplasm of pancreas, unspecified: Secondary | ICD-10-CM

## 2014-05-30 DIAGNOSIS — Z8719 Personal history of other diseases of the digestive system: Secondary | ICD-10-CM

## 2014-05-30 DIAGNOSIS — Z86718 Personal history of other venous thrombosis and embolism: Secondary | ICD-10-CM | POA: Diagnosis not present

## 2014-05-30 LAB — CBC WITH DIFFERENTIAL/PLATELET
BASO%: 0.2 % (ref 0.0–2.0)
Basophils Absolute: 0 10*3/uL (ref 0.0–0.1)
EOS%: 1.2 % (ref 0.0–7.0)
Eosinophils Absolute: 0.1 10*3/uL (ref 0.0–0.5)
HEMATOCRIT: 31.8 % — AB (ref 38.4–49.9)
HGB: 10 g/dL — ABNORMAL LOW (ref 13.0–17.1)
LYMPH%: 22.5 % (ref 14.0–49.0)
MCH: 26.5 pg — ABNORMAL LOW (ref 27.2–33.4)
MCHC: 31.4 g/dL — ABNORMAL LOW (ref 32.0–36.0)
MCV: 84.1 fL (ref 79.3–98.0)
MONO#: 1.1 10*3/uL — ABNORMAL HIGH (ref 0.1–0.9)
MONO%: 11.6 % (ref 0.0–14.0)
NEUT#: 6.4 10*3/uL (ref 1.5–6.5)
NEUT%: 64.5 % (ref 39.0–75.0)
PLATELETS: 360 10*3/uL (ref 140–400)
RBC: 3.78 10*6/uL — AB (ref 4.20–5.82)
RDW: 18.7 % — AB (ref 11.0–14.6)
WBC: 9.9 10*3/uL (ref 4.0–10.3)
lymph#: 2.2 10*3/uL (ref 0.9–3.3)

## 2014-05-30 LAB — COMPREHENSIVE METABOLIC PANEL (CC13)
ALBUMIN: 3.5 g/dL (ref 3.5–5.0)
ALT: 6 U/L (ref 0–55)
AST: 10 U/L (ref 5–34)
Alkaline Phosphatase: 89 U/L (ref 40–150)
Anion Gap: 13 mEq/L — ABNORMAL HIGH (ref 3–11)
BUN: 13.2 mg/dL (ref 7.0–26.0)
CALCIUM: 8.7 mg/dL (ref 8.4–10.4)
CHLORIDE: 105 meq/L (ref 98–109)
CO2: 23 meq/L (ref 22–29)
CREATININE: 0.9 mg/dL (ref 0.7–1.3)
EGFR: >90 mL/min/{1.73_m2} (ref >90)
Glucose: 89 mg/dl (ref 70–140)
Potassium: 4.4 mEq/L (ref 3.5–5.1)
SODIUM: 142 meq/L (ref 136–145)
Total Bilirubin: <0.2 mg/dL (ref 0.20–1.20)
Total Protein: 6.4 g/dL (ref 6.4–8.3)

## 2014-05-30 LAB — CANCER ANTIGEN 19-9: CA 19 9: 1023.6 U/mL — AB (ref <35.0)

## 2014-05-30 MED ORDER — SODIUM CHLORIDE 0.9 % IV SOLN
Freq: Once | INTRAVENOUS | Status: AC
  Administered 2014-05-30: 10:00:00 via INTRAVENOUS

## 2014-05-30 MED ORDER — PACLITAXEL PROTEIN-BOUND CHEMO INJECTION 100 MG
100.0000 mg/m2 | Freq: Once | INTRAVENOUS | Status: AC
  Administered 2014-05-30: 200 mg via INTRAVENOUS
  Filled 2014-05-30: qty 40

## 2014-05-30 MED ORDER — SODIUM CHLORIDE 0.9 % IV SOLN
Freq: Once | INTRAVENOUS | Status: AC
  Administered 2014-05-30: 10:00:00 via INTRAVENOUS
  Filled 2014-05-30: qty 4

## 2014-05-30 MED ORDER — ONDANSETRON HCL 8 MG PO TABS
8.0000 mg | ORAL_TABLET | Freq: Three times a day (TID) | ORAL | Status: DC | PRN
Start: 2014-05-30 — End: 2014-11-25

## 2014-05-30 MED ORDER — MORPHINE SULFATE ER 30 MG PO TBCR: 90.0000 mg | EXTENDED_RELEASE_TABLET | Freq: Three times a day (TID) | ORAL | Status: DC

## 2014-05-30 MED ORDER — HEPARIN SOD (PORK) LOCK FLUSH 100 UNIT/ML IV SOLN
500.0000 [IU] | Freq: Once | INTRAVENOUS | Status: AC | PRN
  Administered 2014-05-30: 500 [IU]
  Filled 2014-05-30: qty 5

## 2014-05-30 MED ORDER — SODIUM CHLORIDE 0.9 % IJ SOLN
10.0000 mL | INTRAMUSCULAR | Status: DC | PRN
  Administered 2014-05-30: 10 mL
  Filled 2014-05-30: qty 10

## 2014-05-30 MED ORDER — SODIUM CHLORIDE 0.9 % IV SOLN
800.0000 mg/m2 | Freq: Once | INTRAVENOUS | Status: AC
  Administered 2014-05-30: 1558 mg via INTRAVENOUS
  Filled 2014-05-30: qty 40.98

## 2014-05-30 MED ORDER — SORBITOL 70 % SOLN: Status: DC

## 2014-05-30 NOTE — Patient Instructions (Signed)
[facility] Discharge Instructions for Patients Receiving Chemotherapy  Today you received the following chemotherapy agents: Abraxane, gemzar  To help prevent nausea and vomiting after your treatment, we encourage you to take your nausea medication as prescribed.   If you develop nausea and vomiting that is not controlled by your nausea medication, call the clinic.   BELOW ARE SYMPTOMS THAT SHOULD BE REPORTED IMMEDIATELY:  *FEVER GREATER THAN 100.5 F  *CHILLS WITH OR WITHOUT FEVER  NAUSEA AND VOMITING THAT IS NOT CONTROLLED WITH YOUR NAUSEA MEDICATION  *UNUSUAL SHORTNESS OF BREATH  *UNUSUAL BRUISING OR BLEEDING  TENDERNESS IN MOUTH AND THROAT WITH OR WITHOUT PRESENCE OF ULCERS  *URINARY PROBLEMS  *BOWEL PROBLEMS  UNUSUAL RASH Items with * indicate a potential emergency and should be followed up as soon as possible.  Feel free to call the clinic you have any questions or concerns. The clinic phone number is [phone].  Please show the [place] at check-in to the Emergency Department and triage nurse.

## 2014-05-30 NOTE — Telephone Encounter (Signed)
Pt confirmed labs/ov per 04/25 POF, gave pt AVS and calendar..... KJ, sent msg to add chemo °

## 2014-05-30 NOTE — Progress Notes (Signed)
[facility] OFFICE PROGRESS NOTE   Diagnosis:  Pancreas cance  INTERVAL HISTORY:   Ray [name] returns after canceling a follow-up visit last week. He completed cycle 2 day 8 gemcitabine/Abraxane 05/09/2014. He tends to have nausea for one to one and a half days following chemotherapy. He takes Compazine as needed. He also notes mild intermittent nausea. No change in abdominal pain. He feels his pain is well-controlled with MS Contin 90 mg every 8 hours. No mouth sores. No diarrhea or constipation. No rash. No shortness of breath or cough. No change in baseline neuropathy symptoms. Continued good appetite.  Objective:  Vital signs in last 24 hours:  Blood pressure 132/79, pulse 76, temperature 98.3 F (36.8 C), temperature source Oral, resp. rate 18, height 6' (1.829 m), weight 170 lb 14.4 oz (77.52 kg), SpO2 100 %.    HEENT: No thrush or ulcers. Resp: Lungs clear bilaterally. Cardio: Regular rate and rhythm. GI: Abdomen soft and nontender. No hepatomegaly. No mass. Vascular: No leg edema.  Portacath without erythema.    Lab Results:  Lab Results  Component Value Date   WBC 9.9 05/30/2014   HGB 10.0* 05/30/2014   HCT 31.8* 05/30/2014   MCV 84.1 05/30/2014   PLT 360 05/30/2014   NEUTROABS 6.4 05/30/2014    Imaging:  No results found.  Medications: I have reviewed the patient's current medications.  Assessment/Plan: 1. Pancreas cancer.  07/06/2013 ,CA 19-9- 2067.   CT abdomen/pelvis 07/09/2013 with interval development of amorphous soft tissue circumferentially encasing the celiac axis and tracking caudally down toward the SMA. Associated stable irregular mild distention of the main pancreatic duct in the body and tail of the pancreas with no discrete mass lesion seen. 2.2 x 1.4 cm portacaval lymph node in the hepatoduodenal ligament had increased in size from 1.3 x 1.1 cm previously. Other scattered small lymph nodes seen in the hepatoduodenal ligament.    Endoscopic ultrasound 07/15/2013 with a very vague masslike lesion in the region of the uncinate pancreas. Fine needle aspiration showed atypical cells consistent with adenocarcinoma.   PET scan 07/29/2013 with hypermetabolic soft tissue posterior to the pancreas with an adjacent hypermetabolic lymph node, no hypermetabolic pancreas mass, no evidence of distant metastatic disease   Cycle 1 FOLFIRINOX 08/02/2013.   Cycle 2 FOLFIRINOX 08/16/2013.   Cycle 3 FOLFIRINOX 09/06/2013.   Cycle 4 FOLFIRINOX 09/21/2013.   CA 19-9 improved on 10/04/2013.   Cycle 5 FOLFIRINOX 10/04/2013.   Cycle 6 FOLFIRINOX 10/18/2013   Restaging CT 10/22/2013 with no change in the soft tissue surrounding the pancreas/celiac axis, scattered indeterminate low-attenuation liver lesions are more conspicuous.  Cycle 7 FOLFIRINOX 11/08/2013  Cycle 8 FOLFIRINOX 11/22/2013  Cycle 9 FOLFIRINOX 12/06/2013  Cycle 10 FOLFIRI (oxaliplatin eliminated) 12/20/2013.  Cycle 11 FOLFIRI 01/03/2014  Restaging CT 01/12/2014-no change in the soft tissue posterior to the pancreas or an 8 mm hepatic metastasis  Cycle 12 FOLFIRI 01/17/2014.  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 Abdominal pain secondary to #1. Progressive.  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  Status post celiac block 03/31/2014. 3. Hypertension. 4. History of gout. 5. Nausea and vomiting following FOLFIRINOX chemotherapy.  Aloxi and Emend added with cycle 2.  Decadron added with cycle 3. 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 7. Hospitalization 08/24/2013 through 08/27/2013 with poorly  controlled nausea/vomiting. Diarrhea during the hospitalization. 8. Skin hyperpigmentation and dryness secondary to 5-fluorouracil     Disposition: Mr. Hakim appears stable. He has completed 2 cycles of gemcitabine/Abraxane on a day one/day 8 schedule. We will follow-up on the CA-19-9 from today. Plan to proceed with cycle 3 today as scheduled. He will return for the day 8 treatment 06/06/2014. We will see him prior to proceeding with cycle 4 on 06/20/2014. He will contact the office in the interim with any problems.  Plan reviewed with [name].    Ned Card ANP/GNP-BC   05/30/2014  9:53 AM

## 2014-06-06 ENCOUNTER — Ambulatory Visit (HOSPITAL_BASED_OUTPATIENT_CLINIC_OR_DEPARTMENT_OTHER): Payer: Medicare Other

## 2014-06-06 ENCOUNTER — Other Ambulatory Visit (HOSPITAL_BASED_OUTPATIENT_CLINIC_OR_DEPARTMENT_OTHER): Payer: Medicare Other

## 2014-06-06 VITALS — BP 122/80 | HR 64 | Temp 98.3°F

## 2014-06-06 DIAGNOSIS — Z5111 Encounter for antineoplastic chemotherapy: Secondary | ICD-10-CM | POA: Diagnosis present

## 2014-06-06 DIAGNOSIS — C259 Malignant neoplasm of pancreas, unspecified: Secondary | ICD-10-CM

## 2014-06-06 DIAGNOSIS — C787 Secondary malignant neoplasm of liver and intrahepatic bile duct: Secondary | ICD-10-CM

## 2014-06-06 DIAGNOSIS — Z8719 Personal history of other diseases of the digestive system: Secondary | ICD-10-CM

## 2014-06-06 LAB — CBC WITH DIFFERENTIAL/PLATELET
BASO%: 0.2 % (ref 0.0–2.0)
Basophils Absolute: 0 10*3/uL (ref 0.0–0.1)
EOS%: 0.2 % (ref 0.0–7.0)
Eosinophils Absolute: 0 10*3/uL (ref 0.0–0.5)
HEMATOCRIT: 29 % — AB (ref 38.4–49.9)
HGB: 9.4 g/dL — ABNORMAL LOW (ref 13.0–17.1)
LYMPH#: 2.1 10*3/uL (ref 0.9–3.3)
LYMPH%: 47.3 % (ref 14.0–49.0)
MCH: 26.6 pg — ABNORMAL LOW (ref 27.2–33.4)
MCHC: 32.4 g/dL (ref 32.0–36.0)
MCV: 82.2 fL (ref 79.3–98.0)
MONO#: 0.4 10*3/uL (ref 0.1–0.9)
MONO%: 7.9 % (ref 0.0–14.0)
NEUT#: 2 10*3/uL (ref 1.5–6.5)
NEUT%: 44.4 % (ref 39.0–75.0)
NRBC: 0 % (ref 0–0)
Platelets: 127 10*3/uL — ABNORMAL LOW (ref 140–400)
RBC: 3.53 10*6/uL — AB (ref 4.20–5.82)
RDW: 18.5 % — ABNORMAL HIGH (ref 11.0–14.6)
WBC: 4.4 10*3/uL (ref 4.0–10.3)

## 2014-06-06 LAB — COMPREHENSIVE METABOLIC PANEL (CC13)
ALT: 7 U/L (ref 0–55)
AST: 10 U/L (ref 5–34)
Albumin: 3.5 g/dL (ref 3.5–5.0)
Alkaline Phosphatase: 80 U/L (ref 40–150)
Anion Gap: 9 mEq/L (ref 3–11)
BILIRUBIN TOTAL: 0.25 mg/dL (ref 0.20–1.20)
BUN: 8.4 mg/dL (ref 7.0–26.0)
CALCIUM: 9 mg/dL (ref 8.4–10.4)
CHLORIDE: 105 meq/L (ref 98–109)
CO2: 26 mEq/L (ref 22–29)
Creatinine: 0.8 mg/dL (ref 0.7–1.3)
EGFR: >90 mL/min/{1.73_m2} (ref >90)
Glucose: 104 mg/dl (ref 70–140)
Potassium: 4.6 mEq/L (ref 3.5–5.1)
Sodium: 141 mEq/L (ref 136–145)
Total Protein: 6.2 g/dL — ABNORMAL LOW (ref 6.4–8.3)

## 2014-06-06 MED ORDER — SODIUM CHLORIDE 0.9 % IJ SOLN
10.0000 mL | INTRAMUSCULAR | Status: DC | PRN
  Administered 2014-06-06: 10 mL
  Filled 2014-06-06: qty 10

## 2014-06-06 MED ORDER — SODIUM CHLORIDE 0.9 % IV SOLN
Freq: Once | INTRAVENOUS | Status: AC
  Administered 2014-06-06: 15:00:00 via INTRAVENOUS

## 2014-06-06 MED ORDER — PACLITAXEL PROTEIN-BOUND CHEMO INJECTION 100 MG
100.0000 mg/m2 | Freq: Once | INTRAVENOUS | Status: AC
  Administered 2014-06-06: 200 mg via INTRAVENOUS
  Filled 2014-06-06: qty 40

## 2014-06-06 MED ORDER — SODIUM CHLORIDE 0.9 % IV SOLN
800.0000 mg/m2 | Freq: Once | INTRAVENOUS | Status: AC
  Administered 2014-06-06: 1558 mg via INTRAVENOUS
  Filled 2014-06-06: qty 40.98

## 2014-06-06 MED ORDER — SODIUM CHLORIDE 0.9 % IV SOLN
Freq: Once | INTRAVENOUS | Status: AC
  Administered 2014-06-06: 16:00:00 via INTRAVENOUS
  Filled 2014-06-06: qty 4

## 2014-06-06 MED ORDER — HEPARIN SOD (PORK) LOCK FLUSH 100 UNIT/ML IV SOLN
500.0000 [IU] | Freq: Once | INTRAVENOUS | Status: AC | PRN
  Administered 2014-06-06: 500 [IU]
  Filled 2014-06-06: qty 5

## 2014-06-06 NOTE — Patient Instructions (Signed)
[facility] Discharge Instructions for Patients Receiving Chemotherapy  Today you received the following chemotherapy agents: Abraxane and Gemzar.  To help prevent nausea and vomiting after your treatment, we encourage you to take your nausea medication: compazine 10 mg  Every 6 hours as needed and Zofran 8 mg every 8 hours as needed.   If you develop nausea and vomiting that is not controlled by your nausea medication, call the clinic.   BELOW ARE SYMPTOMS THAT SHOULD BE REPORTED IMMEDIATELY:  *FEVER GREATER THAN 100.5 F  *CHILLS WITH OR WITHOUT FEVER  NAUSEA AND VOMITING THAT IS NOT CONTROLLED WITH YOUR NAUSEA MEDICATION  *UNUSUAL SHORTNESS OF BREATH  *UNUSUAL BRUISING OR BLEEDING  TENDERNESS IN MOUTH AND THROAT WITH OR WITHOUT PRESENCE OF ULCERS  *URINARY PROBLEMS  *BOWEL PROBLEMS  UNUSUAL RASH Items with * indicate a potential emergency and should be followed up as soon as possible.  Feel free to call the clinic you have any questions or concerns. The clinic phone number is [phone].  Please show the [place] at check-in to the Emergency Department and triage nurse.

## 2014-06-13 ENCOUNTER — Other Ambulatory Visit: Payer: Self-pay | Admitting: Family Medicine

## 2014-06-20 ENCOUNTER — Ambulatory Visit (HOSPITAL_BASED_OUTPATIENT_CLINIC_OR_DEPARTMENT_OTHER): Payer: Medicare Other

## 2014-06-20 ENCOUNTER — Telehealth: Payer: Self-pay | Admitting: Nurse Practitioner

## 2014-06-20 ENCOUNTER — Telehealth: Payer: Self-pay | Admitting: *Deleted

## 2014-06-20 ENCOUNTER — Other Ambulatory Visit: Payer: Self-pay | Admitting: Oncology

## 2014-06-20 ENCOUNTER — Ambulatory Visit (HOSPITAL_BASED_OUTPATIENT_CLINIC_OR_DEPARTMENT_OTHER): Payer: Medicare Other | Admitting: Nurse Practitioner

## 2014-06-20 ENCOUNTER — Other Ambulatory Visit (HOSPITAL_BASED_OUTPATIENT_CLINIC_OR_DEPARTMENT_OTHER): Payer: Medicare Other

## 2014-06-20 VITALS — BP 146/81 | HR 85 | Temp 98.3°F | Resp 18 | Ht 72.0 in | Wt 175.1 lb

## 2014-06-20 DIAGNOSIS — C259 Malignant neoplasm of pancreas, unspecified: Secondary | ICD-10-CM

## 2014-06-20 DIAGNOSIS — Z8719 Personal history of other diseases of the digestive system: Secondary | ICD-10-CM

## 2014-06-20 DIAGNOSIS — Z5111 Encounter for antineoplastic chemotherapy: Secondary | ICD-10-CM | POA: Diagnosis present

## 2014-06-20 DIAGNOSIS — C787 Secondary malignant neoplasm of liver and intrahepatic bile duct: Secondary | ICD-10-CM

## 2014-06-20 DIAGNOSIS — R109 Unspecified abdominal pain: Secondary | ICD-10-CM | POA: Diagnosis not present

## 2014-06-20 LAB — COMPREHENSIVE METABOLIC PANEL (CC13)
ALT: 7 U/L (ref 0–55)
ANION GAP: 9 meq/L (ref 3–11)
AST: 11 U/L (ref 5–34)
Albumin: 3.5 g/dL (ref 3.5–5.0)
Alkaline Phosphatase: 83 U/L (ref 40–150)
BILIRUBIN TOTAL: 0.35 mg/dL (ref 0.20–1.20)
BUN: 7.9 mg/dL (ref 7.0–26.0)
CALCIUM: 8.7 mg/dL (ref 8.4–10.4)
CHLORIDE: 106 meq/L (ref 98–109)
CO2: 26 mEq/L (ref 22–29)
CREATININE: 0.9 mg/dL (ref 0.7–1.3)
EGFR: >90 mL/min/{1.73_m2} (ref >90)
Glucose: 160 mg/dl — ABNORMAL HIGH (ref 70–140)
Potassium: 4.2 mEq/L (ref 3.5–5.1)
SODIUM: 141 meq/L (ref 136–145)
Total Protein: 6.4 g/dL (ref 6.4–8.3)

## 2014-06-20 LAB — CBC WITH DIFFERENTIAL/PLATELET
BASO%: 0 % (ref 0.0–2.0)
Basophils Absolute: 0 10*3/uL (ref 0.0–0.1)
EOS%: 1 % (ref 0.0–7.0)
Eosinophils Absolute: 0.1 10*3/uL (ref 0.0–0.5)
HEMATOCRIT: 30.6 % — AB (ref 38.4–49.9)
HGB: 9.8 g/dL — ABNORMAL LOW (ref 13.0–17.1)
LYMPH%: 21 % (ref 14.0–49.0)
MCH: 26.4 pg — ABNORMAL LOW (ref 27.2–33.4)
MCHC: 32 g/dL (ref 32.0–36.0)
MCV: 82.5 fL (ref 79.3–98.0)
MONO#: 0.5 10*3/uL (ref 0.1–0.9)
MONO%: 7.4 % (ref 0.0–14.0)
NEUT#: 5.1 10*3/uL (ref 1.5–6.5)
NEUT%: 70.6 % (ref 39.0–75.0)
PLATELETS: 215 10*3/uL (ref 140–400)
RBC: 3.71 10*6/uL — ABNORMAL LOW (ref 4.20–5.82)
RDW: 19.3 % — ABNORMAL HIGH (ref 11.0–14.6)
WBC: 7.3 10*3/uL (ref 4.0–10.3)
lymph#: 1.5 10*3/uL (ref 0.9–3.3)

## 2014-06-20 MED ORDER — PACLITAXEL PROTEIN-BOUND CHEMO INJECTION 100 MG
100.0000 mg/m2 | Freq: Once | INTRAVENOUS | Status: AC
  Administered 2014-06-20: 200 mg via INTRAVENOUS
  Filled 2014-06-20: qty 40

## 2014-06-20 MED ORDER — MORPHINE SULFATE 30 MG PO TABS: 30.0000 mg | ORAL_TABLET | ORAL | Status: DC | PRN

## 2014-06-20 MED ORDER — GEMCITABINE HCL CHEMO INJECTION 1 GM/26.3ML
800.0000 mg/m2 | Freq: Once | INTRAVENOUS | Status: AC
  Administered 2014-06-20: 1558 mg via INTRAVENOUS
  Filled 2014-06-20: qty 40.98

## 2014-06-20 MED ORDER — SODIUM CHLORIDE 0.9 % IV SOLN
Freq: Once | INTRAVENOUS | Status: AC
  Administered 2014-06-20: 15:00:00 via INTRAVENOUS

## 2014-06-20 MED ORDER — SODIUM CHLORIDE 0.9 % IJ SOLN
10.0000 mL | INTRAMUSCULAR | Status: DC | PRN
  Administered 2014-06-20: 10 mL
  Filled 2014-06-20: qty 10

## 2014-06-20 MED ORDER — SODIUM CHLORIDE 0.9 % IV SOLN
Freq: Once | INTRAVENOUS | Status: AC
  Administered 2014-06-20: 15:00:00 via INTRAVENOUS
  Filled 2014-06-20: qty 4

## 2014-06-20 MED ORDER — HEPARIN SOD (PORK) LOCK FLUSH 100 UNIT/ML IV SOLN
500.0000 [IU] | Freq: Once | INTRAVENOUS | Status: AC | PRN
  Administered 2014-06-20: 500 [IU]
  Filled 2014-06-20: qty 5

## 2014-06-20 MED ORDER — PROCHLORPERAZINE MALEATE 10 MG PO TABS: 10.0000 mg | ORAL_TABLET | Freq: Four times a day (QID) | ORAL | Status: DC | PRN

## 2014-06-20 NOTE — Telephone Encounter (Signed)
Per staff message and POF I have scheduled appts. Advised scheduler of appts. JMW  

## 2014-06-20 NOTE — Progress Notes (Signed)
[facility] OFFICE PROGRESS NOTE   Diagnosis:  Pancreas cancer  INTERVAL HISTORY:   Mr. Gucciardo returns as scheduled. He completed cycle 3 day 8 gemcitabine/Abraxane on 06/06/2014. He reports minimal nausea. No mouth sores. No diarrhea or constipation. No rash. No fever. Stable numbness in the hands and feet that predated the start of this regimen. He noted a decrease in his energy level over the weekend. Energy has been better thus far today. He reports his pain is well-controlled. He continues MS Contin every 8 hours. He estimates taking MSIR 1 time a day. He notes his skin is dry.  Objective:  Vital signs in last 24 hours:  Blood pressure 146/81, pulse 85, temperature 98.3 F (36.8 C), temperature source Oral, resp. rate 18, height 6' (1.829 m), weight 175 lb 1.6 oz (79.425 kg), SpO2 100 %.    HEENT: No thrush or ulcers. Resp: Lungs clear bilaterally. Cardio: Regular rate and rhythm. GI: Abdomen soft and nontender. No hepatomegaly. No mass. Vascular: No leg edema. Calves soft and nontender. Skin: General dryness.    Lab Results:  Lab Results  Component Value Date   WBC 7.3 06/20/2014   HGB 9.8* 06/20/2014   HCT 30.6* 06/20/2014   MCV 82.5 06/20/2014   PLT 215 06/20/2014   NEUTROABS 5.1 06/20/2014    Imaging:  No results found.  Medications: I have reviewed the patient's current medications.  Assessment/Plan: 1. Pancreas cancer.  07/06/2013 ,CA 19-9- 2067.   CT abdomen/pelvis 07/09/2013 with interval development of amorphous soft tissue circumferentially encasing the celiac axis and tracking caudally down toward the SMA. Associated stable irregular mild distention of the main pancreatic duct in the body and tail of the pancreas with no discrete mass lesion seen. 2.2 x 1.4 cm portacaval lymph node in the hepatoduodenal ligament had increased in size from 1.3 x 1.1 cm previously. Other scattered small lymph nodes seen in the hepatoduodenal ligament.    Endoscopic ultrasound 07/15/2013 with a very vague masslike lesion in the region of the uncinate pancreas. Fine needle aspiration showed atypical cells consistent with adenocarcinoma.   PET scan 07/29/2013 with hypermetabolic soft tissue posterior to the pancreas with an adjacent hypermetabolic lymph node, no hypermetabolic pancreas mass, no evidence of distant metastatic disease   Cycle 1 FOLFIRINOX 08/02/2013.   Cycle 2 FOLFIRINOX 08/16/2013.   Cycle 3 FOLFIRINOX 09/06/2013.   Cycle 4 FOLFIRINOX 09/21/2013.   CA 19-9 improved on 10/04/2013.   Cycle 5 FOLFIRINOX 10/04/2013.   Cycle 6 FOLFIRINOX 10/18/2013   Restaging CT 10/22/2013 with no change in the soft tissue surrounding the pancreas/celiac axis, scattered indeterminate low-attenuation liver lesions are more conspicuous.  Cycle 7 FOLFIRINOX 11/08/2013  Cycle 8 FOLFIRINOX 11/22/2013  Cycle 9 FOLFIRINOX 12/06/2013  Cycle 10 FOLFIRI (oxaliplatin eliminated) 12/20/2013.  Cycle 11 FOLFIRI 01/03/2014  Restaging CT 01/12/2014-no change in the soft tissue posterior to the pancreas or an 8 mm hepatic metastasis  Cycle 12 FOLFIRI 01/17/2014.  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 Abdominal pain secondary to #1. Progressive.  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  Status post celiac block 03/31/2014. 3. Hypertension. 4. History of gout. 5. Nausea  and vomiting following FOLFIRINOX chemotherapy.  Aloxi and Emend added with cycle 2.   Decadron added with cycle 3. 6. Splenic vein  thrombosis-maintained on xarelto. 7. Hospitalization 08/24/2013 through 08/27/2013 with poorly controlled nausea/vomiting. Diarrhea during the hospitalization. 8. Skin hyperpigmentation and dryness secondary to 5-fluorouracil   Disposition: Ray [name] appears stable. He has completed 3 cycles of gemcitabine/Abraxane. The CA-19-9 is better. Plan to proceed with cycle 4 today as scheduled. He will return for a follow-up visit and cycle 5 in 3 weeks. He will contact the office in the interim with any problems.  Plan reviewed with [name].    Ned Card ANP/GNP-BC   06/20/2014  1:49 PM

## 2014-06-20 NOTE — Telephone Encounter (Signed)
per pof to sch pt appt-gave pt copy of sch °

## 2014-06-20 NOTE — Patient Instructions (Signed)
[facility] Discharge Instructions for Patients Receiving Chemotherapy  Today you received the following chemotherapy agents Abraxane/Gemzar.  To help prevent nausea and vomiting after your treatment, we encourage you to take your nausea medication as directed.   If you develop nausea and vomiting that is not controlled by your nausea medication, call the clinic.   BELOW ARE SYMPTOMS THAT SHOULD BE REPORTED IMMEDIATELY:  *FEVER GREATER THAN 100.5 F  *CHILLS WITH OR WITHOUT FEVER  NAUSEA AND VOMITING THAT IS NOT CONTROLLED WITH YOUR NAUSEA MEDICATION  *UNUSUAL SHORTNESS OF BREATH  *UNUSUAL BRUISING OR BLEEDING  TENDERNESS IN MOUTH AND THROAT WITH OR WITHOUT PRESENCE OF ULCERS  *URINARY PROBLEMS  *BOWEL PROBLEMS  UNUSUAL RASH Items with * indicate a potential emergency and should be followed up as soon as possible.  Feel free to call the clinic you have any questions or concerns. The clinic phone number is [phone].  Please show the [facility] at check-in to the Emergency Department and triage nurse.

## 2014-06-21 ENCOUNTER — Telehealth: Payer: Self-pay | Admitting: *Deleted

## 2014-06-21 LAB — CANCER ANTIGEN 19-9: CA 19-9: 481.3 U/mL — ABNORMAL HIGH (ref <35.0)

## 2014-06-21 NOTE — Telephone Encounter (Signed)
Returned patient's phone call and informed that tumor marker is better.  Per Elby Showers. Marcello Moores, NP.  Patient verbalized understanding.

## 2014-06-21 NOTE — Telephone Encounter (Signed)
Left message for patient to call back regarding lab results.

## 2014-06-21 NOTE — Telephone Encounter (Signed)
-----   Message from Owens Shark, NP sent at 06/21/2014  9:09 AM EDT ----- Please let him know the tumor marker is better.

## 2014-06-27 ENCOUNTER — Ambulatory Visit (HOSPITAL_BASED_OUTPATIENT_CLINIC_OR_DEPARTMENT_OTHER): Payer: Medicare Other

## 2014-06-27 ENCOUNTER — Other Ambulatory Visit (HOSPITAL_BASED_OUTPATIENT_CLINIC_OR_DEPARTMENT_OTHER): Payer: Medicare Other

## 2014-06-27 VITALS — BP 130/77 | HR 72 | Temp 98.8°F | Resp 18

## 2014-06-27 DIAGNOSIS — C259 Malignant neoplasm of pancreas, unspecified: Secondary | ICD-10-CM

## 2014-06-27 DIAGNOSIS — Z8719 Personal history of other diseases of the digestive system: Secondary | ICD-10-CM

## 2014-06-27 DIAGNOSIS — Z5111 Encounter for antineoplastic chemotherapy: Secondary | ICD-10-CM | POA: Diagnosis present

## 2014-06-27 DIAGNOSIS — C787 Secondary malignant neoplasm of liver and intrahepatic bile duct: Secondary | ICD-10-CM | POA: Diagnosis not present

## 2014-06-27 LAB — CBC WITH DIFFERENTIAL/PLATELET
BASO%: 0.8 % (ref 0.0–2.0)
Basophils Absolute: 0 10*3/uL (ref 0.0–0.1)
EOS ABS: 0 10*3/uL (ref 0.0–0.5)
EOS%: 0.1 % (ref 0.0–7.0)
HEMATOCRIT: 30.4 % — AB (ref 38.4–49.9)
HEMOGLOBIN: 9.7 g/dL — AB (ref 13.0–17.1)
LYMPH%: 47.3 % (ref 14.0–49.0)
MCH: 25.9 pg — ABNORMAL LOW (ref 27.2–33.4)
MCHC: 31.7 g/dL — ABNORMAL LOW (ref 32.0–36.0)
MCV: 81.8 fL (ref 79.3–98.0)
MONO#: 0.3 10*3/uL (ref 0.1–0.9)
MONO%: 7.4 % (ref 0.0–14.0)
NEUT#: 1.5 10*3/uL (ref 1.5–6.5)
NEUT%: 44.4 % (ref 39.0–75.0)
PLATELETS: 263 10*3/uL (ref 140–400)
RBC: 3.72 10*6/uL — AB (ref 4.20–5.82)
RDW: 20 % — ABNORMAL HIGH (ref 11.0–14.6)
WBC: 3.5 10*3/uL — AB (ref 4.0–10.3)
lymph#: 1.6 10*3/uL (ref 0.9–3.3)

## 2014-06-27 LAB — TECHNOLOGIST REVIEW: Technologist Review: 2

## 2014-06-27 MED ORDER — PACLITAXEL PROTEIN-BOUND CHEMO INJECTION 100 MG
100.0000 mg/m2 | Freq: Once | INTRAVENOUS | Status: AC
  Administered 2014-06-27: 200 mg via INTRAVENOUS
  Filled 2014-06-27: qty 40

## 2014-06-27 MED ORDER — HEPARIN SOD (PORK) LOCK FLUSH 100 UNIT/ML IV SOLN
500.0000 [IU] | Freq: Once | INTRAVENOUS | Status: AC | PRN
  Administered 2014-06-27: 500 [IU]
  Filled 2014-06-27: qty 5

## 2014-06-27 MED ORDER — SODIUM CHLORIDE 0.9 % IV SOLN
Freq: Once | INTRAVENOUS | Status: AC
  Administered 2014-06-27: 12:00:00 via INTRAVENOUS

## 2014-06-27 MED ORDER — SODIUM CHLORIDE 0.9 % IJ SOLN
10.0000 mL | INTRAMUSCULAR | Status: DC | PRN
  Administered 2014-06-27: 10 mL
  Filled 2014-06-27: qty 10

## 2014-06-27 MED ORDER — SODIUM CHLORIDE 0.9 % IV SOLN
Freq: Once | INTRAVENOUS | Status: AC
  Administered 2014-06-27: 12:00:00 via INTRAVENOUS
  Filled 2014-06-27: qty 4

## 2014-06-27 MED ORDER — SODIUM CHLORIDE 0.9 % IV SOLN
800.0000 mg/m2 | Freq: Once | INTRAVENOUS | Status: AC
  Administered 2014-06-27: 1558 mg via INTRAVENOUS
  Filled 2014-06-27: qty 40.98

## 2014-06-27 NOTE — Progress Notes (Signed)
Per [name], okay to treat today with ANC of 1.5 and using CMET results from 06/20/14.

## 2014-06-27 NOTE — Patient Instructions (Signed)
[facility] Discharge Instructions for Patients Receiving Chemotherapy  Today you received the following chemotherapy agents: Abraxane and Gemzar   To help prevent nausea and vomiting after your treatment, we encourage you to take your nausea medication as directed.    If you develop nausea and vomiting that is not controlled by your nausea medication, call the clinic.   BELOW ARE SYMPTOMS THAT SHOULD BE REPORTED IMMEDIATELY:  *FEVER GREATER THAN 100.5 F  *CHILLS WITH OR WITHOUT FEVER  NAUSEA AND VOMITING THAT IS NOT CONTROLLED WITH YOUR NAUSEA MEDICATION  *UNUSUAL SHORTNESS OF BREATH  *UNUSUAL BRUISING OR BLEEDING  TENDERNESS IN MOUTH AND THROAT WITH OR WITHOUT PRESENCE OF ULCERS  *URINARY PROBLEMS  *BOWEL PROBLEMS  UNUSUAL RASH Items with * indicate a potential emergency and should be followed up as soon as possible.  Feel free to call the clinic you have any questions or concerns. The clinic phone number is [phone].  Please show the CHEMO ALERT CARD at check-in to the Emergency Department and triage nurse.   

## 2014-06-28 ENCOUNTER — Ambulatory Visit (INDEPENDENT_AMBULATORY_CARE_PROVIDER_SITE_OTHER): Payer: Medicare Other

## 2014-06-28 DIAGNOSIS — M79606 Pain in leg, unspecified: Secondary | ICD-10-CM

## 2014-06-28 DIAGNOSIS — B351 Tinea unguium: Secondary | ICD-10-CM

## 2014-06-28 DIAGNOSIS — G5790 Unspecified mononeuropathy of unspecified lower limb: Secondary | ICD-10-CM

## 2014-06-28 NOTE — Progress Notes (Signed)
Patient ID: Ray [name], male   DOB: 10-06-1955, 59 y.o.   MRN: [id number] Complaint:  Visit Type: Patient returns to my office for continued preventative foot care services. Complaint: Patient states" my nails have grown long and thick and become painful to walk and wear shoes" Patient has been diagnosed with cancer and has neuropathy. He presents for preventative foot care services. No changes to ROS.  Podiatric Exam: Vascular: dorsalis pedis and posterior tibial pulses are palpable bilateral. Capillary return is immediate. Temperature gradient is WNL. Skin turgor WNL  Sensorium: Normal Semmes Weinstein monofilament test. Normal tactile sensation bilaterally. Nail Exam: Pt has thick disfigured discolored nails with subungual debris noted bilateral entire nail hallux through fifth toenails Ulcer Exam: There is no evidence of ulcer or pre-ulcerative changes or infection. Orthopedic Exam: Muscle tone and strength are WNL. No limitations in general ROM. No crepitus or effusions noted. Foot type and digits show no abnormalities. Bony prominences are unremarkable. Skin: No Porokeratosis. No infection or ulcers  Diagnosis:  Tinea unguium, Pain in right toe, pain in left toes  Treatment & Plan Procedures and Treatment: Consent by patient was obtained for treatment procedures. The patient understood the discussion of treatment and procedures well. All questions were answered thoroughly reviewed. Debridement of mycotic and hypertrophic toenails, 1 through 5 bilateral and clearing of subungual debris. No ulceration, no infection noted.  Return Visit-Office Procedure: Patient instructed to return to the office for a follow up visit 3 months for continued evaluation and treatment.

## 2014-07-04 ENCOUNTER — Other Ambulatory Visit: Payer: Self-pay | Admitting: Oncology

## 2014-07-05 ENCOUNTER — Ambulatory Visit: Payer: Medicare Other

## 2014-07-11 ENCOUNTER — Telehealth: Payer: Self-pay | Admitting: Oncology

## 2014-07-11 ENCOUNTER — Other Ambulatory Visit: Payer: Self-pay | Admitting: *Deleted

## 2014-07-11 ENCOUNTER — Ambulatory Visit (HOSPITAL_BASED_OUTPATIENT_CLINIC_OR_DEPARTMENT_OTHER): Payer: Medicare Other | Admitting: Oncology

## 2014-07-11 ENCOUNTER — Other Ambulatory Visit (HOSPITAL_BASED_OUTPATIENT_CLINIC_OR_DEPARTMENT_OTHER): Payer: Medicare Other

## 2014-07-11 ENCOUNTER — Ambulatory Visit (HOSPITAL_BASED_OUTPATIENT_CLINIC_OR_DEPARTMENT_OTHER): Payer: Medicare Other

## 2014-07-11 VITALS — BP 144/86 | HR 86 | Temp 99.0°F | Resp 17 | Ht 72.0 in | Wt 176.1 lb

## 2014-07-11 DIAGNOSIS — Z5111 Encounter for antineoplastic chemotherapy: Secondary | ICD-10-CM | POA: Diagnosis present

## 2014-07-11 DIAGNOSIS — C259 Malignant neoplasm of pancreas, unspecified: Secondary | ICD-10-CM

## 2014-07-11 DIAGNOSIS — C787 Secondary malignant neoplasm of liver and intrahepatic bile duct: Secondary | ICD-10-CM | POA: Diagnosis not present

## 2014-07-11 DIAGNOSIS — Z86718 Personal history of other venous thrombosis and embolism: Secondary | ICD-10-CM

## 2014-07-11 DIAGNOSIS — Z8719 Personal history of other diseases of the digestive system: Secondary | ICD-10-CM

## 2014-07-11 DIAGNOSIS — R109 Unspecified abdominal pain: Secondary | ICD-10-CM | POA: Diagnosis not present

## 2014-07-11 LAB — COMPREHENSIVE METABOLIC PANEL (CC13)
ALK PHOS: 83 U/L (ref 40–150)
AST: 12 U/L (ref 5–34)
Albumin: 3.3 g/dL — ABNORMAL LOW (ref 3.5–5.0)
Anion Gap: 8 mEq/L (ref 3–11)
BILIRUBIN TOTAL: 0.39 mg/dL (ref 0.20–1.20)
BUN: 8.7 mg/dL (ref 7.0–26.0)
CO2: 25 meq/L (ref 22–29)
Calcium: 8.6 mg/dL (ref 8.4–10.4)
Chloride: 107 mEq/L (ref 98–109)
Creatinine: 0.8 mg/dL (ref 0.7–1.3)
GLUCOSE: 104 mg/dL (ref 70–140)
POTASSIUM: 4.5 meq/L (ref 3.5–5.1)
SODIUM: 141 meq/L (ref 136–145)
TOTAL PROTEIN: 6.1 g/dL — AB (ref 6.4–8.3)

## 2014-07-11 LAB — CBC WITH DIFFERENTIAL/PLATELET
BASO%: 0.6 % (ref 0.0–2.0)
Basophils Absolute: 0 10*3/uL (ref 0.0–0.1)
EOS ABS: 0 10*3/uL (ref 0.0–0.5)
EOS%: 0.7 % (ref 0.0–7.0)
HEMATOCRIT: 26.2 % — AB (ref 38.4–49.9)
HEMOGLOBIN: 8.4 g/dL — AB (ref 13.0–17.1)
LYMPH%: 21.6 % (ref 14.0–49.0)
MCH: 26.1 pg — ABNORMAL LOW (ref 27.2–33.4)
MCHC: 32.2 g/dL (ref 32.0–36.0)
MCV: 81 fL (ref 79.3–98.0)
MONO#: 0.6 10*3/uL (ref 0.1–0.9)
MONO%: 11.5 % (ref 0.0–14.0)
NEUT%: 65.6 % (ref 39.0–75.0)
NEUTROS ABS: 3.6 10*3/uL (ref 1.5–6.5)
PLATELETS: 261 10*3/uL (ref 140–400)
RBC: 3.23 10*6/uL — AB (ref 4.20–5.82)
RDW: 21.5 % — AB (ref 11.0–14.6)
WBC: 5.4 10*3/uL (ref 4.0–10.3)
lymph#: 1.2 10*3/uL (ref 0.9–3.3)

## 2014-07-11 MED ORDER — SODIUM CHLORIDE 0.9 % IV SOLN
800.0000 mg/m2 | Freq: Once | INTRAVENOUS | Status: AC
  Administered 2014-07-11: 1558 mg via INTRAVENOUS
  Filled 2014-07-11: qty 40.98

## 2014-07-11 MED ORDER — SODIUM CHLORIDE 0.9 % IV SOLN
Freq: Once | INTRAVENOUS | Status: AC
  Administered 2014-07-11: 13:00:00 via INTRAVENOUS

## 2014-07-11 MED ORDER — SODIUM CHLORIDE 0.9 % IV SOLN
Freq: Once | INTRAVENOUS | Status: AC
  Administered 2014-07-11: 13:00:00 via INTRAVENOUS
  Filled 2014-07-11: qty 4

## 2014-07-11 MED ORDER — PACLITAXEL PROTEIN-BOUND CHEMO INJECTION 100 MG
100.0000 mg/m2 | Freq: Once | INTRAVENOUS | Status: AC
  Administered 2014-07-11: 200 mg via INTRAVENOUS
  Filled 2014-07-11: qty 40

## 2014-07-11 MED ORDER — SODIUM CHLORIDE 0.9 % IJ SOLN
10.0000 mL | INTRAMUSCULAR | Status: DC | PRN
  Administered 2014-07-11: 10 mL
  Filled 2014-07-11: qty 10

## 2014-07-11 MED ORDER — MORPHINE SULFATE ER 30 MG PO TBCR: 90.0000 mg | EXTENDED_RELEASE_TABLET | Freq: Three times a day (TID) | ORAL | Status: DC

## 2014-07-11 MED ORDER — HEPARIN SOD (PORK) LOCK FLUSH 100 UNIT/ML IV SOLN
500.0000 [IU] | Freq: Once | INTRAVENOUS | Status: AC | PRN
  Administered 2014-07-11: 500 [IU]
  Filled 2014-07-11: qty 5

## 2014-07-11 NOTE — Progress Notes (Signed)
[facility] OFFICE PROGRESS NOTE   Diagnosis: Pancreas cancer  INTERVAL HISTORY:   [name] returns as scheduled. He complete his cycle 4 gemcitabine/Abraxane beginning 06/20/2014. No nausea, fever, or change in baseline neuropathy symptoms. He feels well. Good appetite. The pain is under good control with MS Contin.  Objective:  Vital signs in last 24 hours:  Blood pressure 144/86, pulse 86, temperature 99 F (37.2 C), temperature source Oral, resp. rate 17, height 6' (1.829 m), weight 176 lb 1.6 oz (79.878 kg), SpO2 100 %.    HEENT: No thrush or ulcers Resp: Lungs clear bilaterally Cardio: Regular rate and rhythm GI: No hepatomegaly, nontender, no mass Vascular: No leg edema  Skin: Dry hyperpigmented skin over the trunk   Portacath/PICC-without erythema  Lab Results:  Lab Results  Component Value Date   WBC 5.4 07/11/2014   HGB 8.4* 07/11/2014   HCT 26.2* 07/11/2014   MCV 81.0 07/11/2014   PLT 261 07/11/2014   NEUTROABS 3.6 07/11/2014    CA 19-9 on 06/20/2014: 41  Medications: I have reviewed the patient's current medications.  Assessment/Plan: 1. Pancreas cancer.  07/06/2013 ,CA 19-9- 2067.   CT abdomen/pelvis 07/09/2013 with interval development of amorphous soft tissue circumferentially encasing the celiac axis and tracking caudally down toward the SMA. Associated stable irregular mild distention of the main pancreatic duct in the body and tail of the pancreas with no discrete mass lesion seen. 2.2 x 1.4 cm portacaval lymph node in the hepatoduodenal ligament had increased in size from 1.3 x 1.1 cm previously. Other scattered small lymph nodes seen in the hepatoduodenal ligament.   Endoscopic ultrasound 07/15/2013 with a very vague masslike lesion in the region of the uncinate pancreas. Fine needle aspiration showed atypical cells consistent with adenocarcinoma.   PET scan 07/29/2013 with hypermetabolic soft tissue posterior to the pancreas  with an adjacent hypermetabolic lymph node, no hypermetabolic pancreas mass, no evidence of distant metastatic disease   Cycle 1 FOLFIRINOX 08/02/2013.   Cycle 2 FOLFIRINOX 08/16/2013.   Cycle 3 FOLFIRINOX 09/06/2013.   Cycle 4 FOLFIRINOX 09/21/2013.   CA 19-9 improved on 10/04/2013.   Cycle 5 FOLFIRINOX 10/04/2013.   Cycle 6 FOLFIRINOX 10/18/2013   Restaging CT 10/22/2013 with no change in the soft tissue surrounding the pancreas/celiac axis, scattered indeterminate low-attenuation liver lesions are more conspicuous.  Cycle 7 FOLFIRINOX 11/08/2013  Cycle 8 FOLFIRINOX 11/22/2013  Cycle 9 FOLFIRINOX 12/06/2013  Cycle 10 FOLFIRI (oxaliplatin eliminated) 12/20/2013.  Cycle 11 FOLFIRI 01/03/2014  Restaging CT 01/12/2014-no change in the soft tissue posterior to the pancreas or an 8 mm hepatic metastasis  Cycle 12 FOLFIRI 01/17/2014.  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, soft tissue density encasing the celiac axis, a possible 9 mm hepatic metastasis  Initiation of gemcitabine/Abraxane on a day one/day 8 schedule 04/11/2014  Cycle 2 gemcitabine/Abraxane 05/02/2014, 05/09/2014  CA-19-9 improved 05/30/2014  Cycle 3 gemcitabine/Abraxane 05/30/2014, 06/06/2014  Cycle 4 gemcitabine/Abraxane 06/20/2014 2. Abdominal pain secondary to #1. Progressive.  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  Status post celiac block 03/31/2014. 3. Hypertension. 4. History of gout. 5. Nausea and vomiting following FOLFIRINOX chemotherapy.  Aloxi and Emend added with cycle 2.   Decadron added with cycle 3. 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 7. Hospitalization 08/24/2013 through 08/27/2013 with poorly controlled nausea/vomiting. Diarrhea during the hospitalization. 8. Skin hyperpigmentation and dryness secondary to  5-fluorouracil  Disposition:  Mr. Vanloan appears well today. He is tolerating the chemotherapy well, the CA 19-9 is lower, and his pain is under better control. The plan is to continue gemcitabine/Abraxane on a day one/day 8 schedule. He will return for an office visit in 3 weeks.  Betsy Coder, MD  07/11/2014  12:29 PM

## 2014-07-11 NOTE — Patient Instructions (Signed)
[facility] Discharge Instructions for Patients Receiving Chemotherapy  Today you received the following chemotherapy agents:  Abraxane and Gemzar.  To help prevent nausea and vomiting after your treatment, we encourage you to take your nausea medication as prescribed.   If you develop nausea and vomiting that is not controlled by your nausea medication, call the clinic.   BELOW ARE SYMPTOMS THAT SHOULD BE REPORTED IMMEDIATELY:  *FEVER GREATER THAN 100.5 F  *CHILLS WITH OR WITHOUT FEVER  NAUSEA AND VOMITING THAT IS NOT CONTROLLED WITH YOUR NAUSEA MEDICATION  *UNUSUAL SHORTNESS OF BREATH  *UNUSUAL BRUISING OR BLEEDING  TENDERNESS IN MOUTH AND THROAT WITH OR WITHOUT PRESENCE OF ULCERS  *URINARY PROBLEMS  *BOWEL PROBLEMS  UNUSUAL RASH Items with * indicate a potential emergency and should be followed up as soon as possible.  Feel free to call the clinic you have any questions or concerns. The clinic phone number is [phone].  Please show the CHEMO ALERT CARD at check-in to the Emergency Department and triage nurse.   

## 2014-07-11 NOTE — Telephone Encounter (Signed)
Gave and printed appt sched and avs for pt for June and July  °

## 2014-07-12 ENCOUNTER — Other Ambulatory Visit: Payer: Self-pay | Admitting: Family Medicine

## 2014-07-12 ENCOUNTER — Other Ambulatory Visit: Payer: Self-pay | Admitting: Oncology

## 2014-07-12 ENCOUNTER — Telehealth: Payer: Self-pay | Admitting: *Deleted

## 2014-07-12 LAB — CANCER ANTIGEN 19-9: CA 19 9: 197.2 U/mL — AB (ref <35.0)

## 2014-07-12 NOTE — Telephone Encounter (Signed)
-----   Message from Ladell Pier, MD sent at 07/12/2014  2:07 PM EDT ----- Please call patient, ca19-9 is better

## 2014-07-12 NOTE — Telephone Encounter (Signed)
Per [name]; notified pt that ca19-9 is better.  Pt verbalized understanding.

## 2014-07-13 NOTE — Telephone Encounter (Signed)
Red Team, please notify patient that I have filled rx's (cymbalta, amlodipine) for 1 month but he is overdue for follow up of depression and hypertension so needs to be seen within the next month, to be sure medication is working safely/effectively. Covering for Dr [name]. Hilton Sinclair, MD

## 2014-07-18 ENCOUNTER — Ambulatory Visit (HOSPITAL_BASED_OUTPATIENT_CLINIC_OR_DEPARTMENT_OTHER): Payer: Medicare Other

## 2014-07-18 ENCOUNTER — Other Ambulatory Visit (HOSPITAL_BASED_OUTPATIENT_CLINIC_OR_DEPARTMENT_OTHER): Payer: Medicare Other

## 2014-07-18 VITALS — BP 143/78 | HR 99 | Temp 98.4°F

## 2014-07-18 DIAGNOSIS — C787 Secondary malignant neoplasm of liver and intrahepatic bile duct: Secondary | ICD-10-CM

## 2014-07-18 DIAGNOSIS — Z5111 Encounter for antineoplastic chemotherapy: Secondary | ICD-10-CM

## 2014-07-18 DIAGNOSIS — C259 Malignant neoplasm of pancreas, unspecified: Secondary | ICD-10-CM

## 2014-07-18 DIAGNOSIS — Z8719 Personal history of other diseases of the digestive system: Secondary | ICD-10-CM

## 2014-07-18 LAB — CBC WITH DIFFERENTIAL/PLATELET
BASO%: 0.3 % (ref 0.0–2.0)
Basophils Absolute: 0 10*3/uL (ref 0.0–0.1)
EOS%: 0.1 % (ref 0.0–7.0)
Eosinophils Absolute: 0 10*3/uL (ref 0.0–0.5)
HCT: 26.8 % — ABNORMAL LOW (ref 38.4–49.9)
HGB: 8.5 g/dL — ABNORMAL LOW (ref 13.0–17.1)
LYMPH#: 1.8 10*3/uL (ref 0.9–3.3)
LYMPH%: 29 % (ref 14.0–49.0)
MCH: 25.8 pg — ABNORMAL LOW (ref 27.2–33.4)
MCHC: 31.8 g/dL — ABNORMAL LOW (ref 32.0–36.0)
MCV: 81.1 fL (ref 79.3–98.0)
MONO#: 0.5 10*3/uL (ref 0.1–0.9)
MONO%: 7.8 % (ref 0.0–14.0)
NEUT#: 4 10*3/uL (ref 1.5–6.5)
NEUT%: 62.8 % (ref 39.0–75.0)
Platelets: 265 10*3/uL (ref 140–400)
RBC: 3.31 10*6/uL — ABNORMAL LOW (ref 4.20–5.82)
RDW: 22.2 % — ABNORMAL HIGH (ref 11.0–14.6)
WBC: 6.3 10*3/uL (ref 4.0–10.3)

## 2014-07-18 LAB — TECHNOLOGIST REVIEW

## 2014-07-18 MED ORDER — SODIUM CHLORIDE 0.9 % IV SOLN
Freq: Once | INTRAVENOUS | Status: AC
  Administered 2014-07-18: 14:00:00 via INTRAVENOUS

## 2014-07-18 MED ORDER — SODIUM CHLORIDE 0.9 % IV SOLN
Freq: Once | INTRAVENOUS | Status: AC
  Administered 2014-07-18: 14:00:00 via INTRAVENOUS
  Filled 2014-07-18: qty 4

## 2014-07-18 MED ORDER — SODIUM CHLORIDE 0.9 % IV SOLN
800.0000 mg/m2 | Freq: Once | INTRAVENOUS | Status: AC
  Administered 2014-07-18: 1558 mg via INTRAVENOUS
  Filled 2014-07-18: qty 40.98

## 2014-07-18 MED ORDER — HEPARIN SOD (PORK) LOCK FLUSH 100 UNIT/ML IV SOLN
500.0000 [IU] | Freq: Once | INTRAVENOUS | Status: AC | PRN
  Administered 2014-07-18: 500 [IU]
  Filled 2014-07-18: qty 5

## 2014-07-18 MED ORDER — PACLITAXEL PROTEIN-BOUND CHEMO INJECTION 100 MG
100.0000 mg/m2 | Freq: Once | INTRAVENOUS | Status: AC
  Administered 2014-07-18: 200 mg via INTRAVENOUS
  Filled 2014-07-18: qty 40

## 2014-07-18 MED ORDER — SODIUM CHLORIDE 0.9 % IJ SOLN
10.0000 mL | INTRAMUSCULAR | Status: DC | PRN
  Administered 2014-07-18: 10 mL
  Filled 2014-07-18: qty 10

## 2014-07-18 NOTE — Patient Instructions (Signed)
[facility] Discharge Instructions for Patients Receiving Chemotherapy  Today you received the following chemotherapy agents gemzar/abraxane  To help prevent nausea and vomiting after your treatment, we encourage you to take your nausea medication as directed  If you develop nausea and vomiting that is not controlled by your nausea medication, call the clinic.   BELOW ARE SYMPTOMS THAT SHOULD BE REPORTED IMMEDIATELY:  *FEVER GREATER THAN 100.5 F  *CHILLS WITH OR WITHOUT FEVER  NAUSEA AND VOMITING THAT IS NOT CONTROLLED WITH YOUR NAUSEA MEDICATION  *UNUSUAL SHORTNESS OF BREATH  *UNUSUAL BRUISING OR BLEEDING  TENDERNESS IN MOUTH AND THROAT WITH OR WITHOUT PRESENCE OF ULCERS  *URINARY PROBLEMS  *BOWEL PROBLEMS  UNUSUAL RASH Items with * indicate a potential emergency and should be followed up as soon as possible.  Feel free to call the clinic you have any questions or concerns. The clinic phone number is [phone].  

## 2014-07-18 NOTE — Progress Notes (Signed)
Ok to proceed with treatment without CMET today per Dr [name]

## 2014-07-25 ENCOUNTER — Telehealth: Payer: Self-pay | Admitting: *Deleted

## 2014-07-25 NOTE — Telephone Encounter (Signed)
PT.'S PRIMARY CARE PHYSICIAN, DR. Marye Round MCINTYRE, TOOK PT. OFF HIS "FLUID PILL"- (LOSARTAN) SIX MONTHS AGO. PT.'S ANKLES STARTED SWELLING ONE MONTH AGO. PT.'S FEET AND ANKLES ARE SWOLLEN AND WARM TO THE TOUCH. NO DISCOLORATION. PT. IS ELEVATING HIS FEET AND LEGS WHICH DOES HELP. SHOULD HE RESTART HIS FLUID PILL? VERBAL ORDER AND READ BACK TO DR.SHERRILL- RESTARTING THE LOSARTAN WILL NOT HELP THE SWELLING. ELEVATE LEGS AND FEET HIGHER THAN PT.'S HEART WHEN HE IS LYING DOWN OR SITTING. MAY TRY SUPPORT HOSE. WILL EVALUATE AT PT.'S VISIT ON 08/01/14. IF ANY REDNESS OR CONDITION WORSENS CALL THIS OFFICE. NOTIFIED PT.'S WIFE OF THE ABOVE INSTRUCTIONS. SHE VOICES UNDERSTANDING.

## 2014-07-28 ENCOUNTER — Telehealth: Payer: Self-pay

## 2014-07-28 DIAGNOSIS — I82890 Acute embolism and thrombosis of other specified veins: Secondary | ICD-10-CM

## 2014-07-28 MED ORDER — RIVAROXABAN 20 MG PO TABS: 20.0000 mg | ORAL_TABLET | Freq: Every morning | ORAL | Status: DC

## 2014-07-28 NOTE — Telephone Encounter (Signed)
Pt called stating he had a question about his xarelto

## 2014-07-28 NOTE — Telephone Encounter (Signed)
3 month supply from [facility] ordered per protocol

## 2014-07-28 NOTE — Telephone Encounter (Signed)
Pt stated he usually uses [facility] for [facility]to but there will be a lapse in time to get from [facility]. Will fill one month at [place] and then a 3 month supply from [facility]

## 2014-07-31 ENCOUNTER — Other Ambulatory Visit: Payer: Self-pay | Admitting: Oncology

## 2014-08-01 ENCOUNTER — Ambulatory Visit (HOSPITAL_BASED_OUTPATIENT_CLINIC_OR_DEPARTMENT_OTHER): Payer: Medicare Other

## 2014-08-01 ENCOUNTER — Other Ambulatory Visit: Payer: Medicare Other

## 2014-08-01 ENCOUNTER — Ambulatory Visit: Payer: Medicare Other | Admitting: Nurse Practitioner

## 2014-08-01 ENCOUNTER — Ambulatory Visit (HOSPITAL_BASED_OUTPATIENT_CLINIC_OR_DEPARTMENT_OTHER): Payer: Medicare Other | Admitting: Nurse Practitioner

## 2014-08-01 ENCOUNTER — Telehealth: Payer: Self-pay | Admitting: Nurse Practitioner

## 2014-08-01 VITALS — BP 135/77 | HR 67 | Temp 99.0°F | Resp 18 | Ht 72.0 in | Wt 171.5 lb

## 2014-08-01 DIAGNOSIS — C787 Secondary malignant neoplasm of liver and intrahepatic bile duct: Secondary | ICD-10-CM

## 2014-08-01 DIAGNOSIS — R109 Unspecified abdominal pain: Secondary | ICD-10-CM | POA: Diagnosis not present

## 2014-08-01 DIAGNOSIS — C259 Malignant neoplasm of pancreas, unspecified: Secondary | ICD-10-CM | POA: Diagnosis present

## 2014-08-01 DIAGNOSIS — Z8719 Personal history of other diseases of the digestive system: Secondary | ICD-10-CM

## 2014-08-01 DIAGNOSIS — Z86718 Personal history of other venous thrombosis and embolism: Secondary | ICD-10-CM

## 2014-08-01 DIAGNOSIS — D6481 Anemia due to antineoplastic chemotherapy: Secondary | ICD-10-CM

## 2014-08-01 DIAGNOSIS — Z5111 Encounter for antineoplastic chemotherapy: Secondary | ICD-10-CM

## 2014-08-01 LAB — CBC WITH DIFFERENTIAL/PLATELET
BASO%: 0.3 % (ref 0.0–2.0)
BASOS ABS: 0 10*3/uL (ref 0.0–0.1)
EOS%: 1.3 % (ref 0.0–7.0)
Eosinophils Absolute: 0.1 10*3/uL (ref 0.0–0.5)
HEMATOCRIT: 24.6 % — AB (ref 38.4–49.9)
HGB: 7.7 g/dL — ABNORMAL LOW (ref 13.0–17.1)
LYMPH%: 14.9 % (ref 14.0–49.0)
MCH: 25 pg — ABNORMAL LOW (ref 27.2–33.4)
MCHC: 31.2 g/dL — ABNORMAL LOW (ref 32.0–36.0)
MCV: 80.2 fL (ref 79.3–98.0)
MONO#: 0.8 10*3/uL (ref 0.1–0.9)
MONO%: 11.4 % (ref 0.0–14.0)
NEUT#: 4.8 10*3/uL (ref 1.5–6.5)
NEUT%: 72.1 % (ref 39.0–75.0)
Platelets: 357 10*3/uL (ref 140–400)
RBC: 3.07 10*6/uL — ABNORMAL LOW (ref 4.20–5.82)
RDW: 24.4 % — ABNORMAL HIGH (ref 11.0–14.6)
WBC: 6.7 10*3/uL (ref 4.0–10.3)
lymph#: 1 10*3/uL (ref 0.9–3.3)

## 2014-08-01 LAB — COMPREHENSIVE METABOLIC PANEL (CC13)
ALT: 8 U/L (ref 0–55)
ANION GAP: 7 meq/L (ref 3–11)
AST: 13 U/L (ref 5–34)
Albumin: 3.4 g/dL — ABNORMAL LOW (ref 3.5–5.0)
Alkaline Phosphatase: 77 U/L (ref 40–150)
BUN: 6.1 mg/dL — ABNORMAL LOW (ref 7.0–26.0)
CHLORIDE: 108 meq/L (ref 98–109)
CO2: 26 meq/L (ref 22–29)
Calcium: 8.6 mg/dL (ref 8.4–10.4)
Creatinine: 1 mg/dL (ref 0.7–1.3)
EGFR: >90 mL/min/{1.73_m2} (ref >90)
Glucose: 104 mg/dl (ref 70–140)
Potassium: 4.6 mEq/L (ref 3.5–5.1)
Sodium: 141 mEq/L (ref 136–145)
TOTAL PROTEIN: 6.1 g/dL — AB (ref 6.4–8.3)
Total Bilirubin: 0.58 mg/dL (ref 0.20–1.20)

## 2014-08-01 MED ORDER — HEPARIN SOD (PORK) LOCK FLUSH 100 UNIT/ML IV SOLN
500.0000 [IU] | Freq: Once | INTRAVENOUS | Status: AC | PRN
  Administered 2014-08-01: 500 [IU]
  Filled 2014-08-01: qty 5

## 2014-08-01 MED ORDER — SODIUM CHLORIDE 0.9 % IV SOLN
800.0000 mg/m2 | Freq: Once | INTRAVENOUS | Status: AC
  Administered 2014-08-01: 1558 mg via INTRAVENOUS
  Filled 2014-08-01: qty 40.98

## 2014-08-01 MED ORDER — SODIUM CHLORIDE 0.9 % IV SOLN
Freq: Once | INTRAVENOUS | Status: AC
  Administered 2014-08-01: 12:00:00 via INTRAVENOUS
  Filled 2014-08-01: qty 4

## 2014-08-01 MED ORDER — SODIUM CHLORIDE 0.9 % IJ SOLN
10.0000 mL | INTRAMUSCULAR | Status: DC | PRN
  Administered 2014-08-01: 10 mL
  Filled 2014-08-01: qty 10

## 2014-08-01 MED ORDER — SODIUM CHLORIDE 0.9 % IV SOLN
Freq: Once | INTRAVENOUS | Status: AC
  Administered 2014-08-01: 12:00:00 via INTRAVENOUS

## 2014-08-01 MED ORDER — PACLITAXEL PROTEIN-BOUND CHEMO INJECTION 100 MG
100.0000 mg/m2 | Freq: Once | INTRAVENOUS | Status: AC
  Administered 2014-08-01: 200 mg via INTRAVENOUS
  Filled 2014-08-01: qty 40

## 2014-08-01 NOTE — Patient Instructions (Signed)
[facility] Discharge Instructions for Patients Receiving Chemotherapy  Today you received the following chemotherapy agents: Abraxane, Gemzar   To help prevent nausea and vomiting after your treatment, we encourage you to take your nausea medication as directed.    If you develop nausea and vomiting that is not controlled by your nausea medication, call the clinic.   BELOW ARE SYMPTOMS THAT SHOULD BE REPORTED IMMEDIATELY:  *FEVER GREATER THAN 100.5 F  *CHILLS WITH OR WITHOUT FEVER  NAUSEA AND VOMITING THAT IS NOT CONTROLLED WITH YOUR NAUSEA MEDICATION  *UNUSUAL SHORTNESS OF BREATH  *UNUSUAL BRUISING OR BLEEDING  TENDERNESS IN MOUTH AND THROAT WITH OR WITHOUT PRESENCE OF ULCERS  *URINARY PROBLEMS  *BOWEL PROBLEMS  UNUSUAL RASH Items with * indicate a potential emergency and should be followed up as soon as possible.  Feel free to call the clinic you have any questions or concerns. The clinic phone number is [phone].  Please show the CHEMO ALERT CARD at check-in to the Emergency Department and triage nurse.   

## 2014-08-01 NOTE — Progress Notes (Signed)
Per Lattie Haw NP okay to treat with labs 08/01/14.

## 2014-08-01 NOTE — Telephone Encounter (Signed)
Appointments made and avs printed for patient °

## 2014-08-01 NOTE — Progress Notes (Signed)
[facility] OFFICE PROGRESS NOTE   Diagnosis:  Pancreas cancer  INTERVAL HISTORY:   Ray [name] returns as scheduled. He continues gemcitabine/Abraxane on a day one/day 8 schedule. He denies nausea/vomiting. No mouth sores. No diarrhea. He has stable neuropathy symptoms. No fever. No rash. The leg swelling he was previously experiencing has resolved. He reports feeling "weak" beginning yesterday. He denies any bleeding. No shortness of breath or chest pain. Abdominal pain is well-controlled. He continues MS Contin 3 times a day. He rarely takes breakthrough pain medication.  Objective:  Vital signs in last 24 hours:  Blood pressure 135/77, pulse 67, temperature 99 F (37.2 C), temperature source Oral, resp. rate 18, height 6' (1.829 m), weight 171 lb 8 oz (77.792 kg), SpO2 100 %.    HEENT: No thrush or ulcers. Resp: Lungs clear bilaterally. Cardio: Regular rate and rhythm. GI: Abdomen soft and nontender. No hepatomegaly. Vascular: No leg edema.  Port-A-Cath without erythema.    Lab Results:  Lab Results  Component Value Date   WBC 6.7 08/01/2014   HGB 7.7* 08/01/2014   HCT 24.6* 08/01/2014   MCV 80.2 08/01/2014   PLT 357 08/01/2014   NEUTROABS 4.8 08/01/2014    Imaging:  No results found.  Medications: I have reviewed the patient's current medications.  Assessment/Plan: 1. Pancreas cancer.  07/06/2013 ,CA 19-9- 2067.   CT abdomen/pelvis 07/09/2013 with interval development of amorphous soft tissue circumferentially encasing the celiac axis and tracking caudally down toward the SMA. Associated stable irregular mild distention of the main pancreatic duct in the body and tail of the pancreas with no discrete mass lesion seen. 2.2 x 1.4 cm portacaval lymph node in the hepatoduodenal ligament had increased in size from 1.3 x 1.1 cm previously. Other scattered small lymph nodes seen in the hepatoduodenal ligament.   Endoscopic ultrasound 07/15/2013 with a  very vague masslike lesion in the region of the uncinate pancreas. Fine needle aspiration showed atypical cells consistent with adenocarcinoma.   PET scan 07/29/2013 with hypermetabolic soft tissue posterior to the pancreas with an adjacent hypermetabolic lymph node, no hypermetabolic pancreas mass, no evidence of distant metastatic disease   Cycle 1 FOLFIRINOX 08/02/2013.   Cycle 2 FOLFIRINOX 08/16/2013.   Cycle 3 FOLFIRINOX 09/06/2013.   Cycle 4 FOLFIRINOX 09/21/2013.   CA 19-9 improved on 10/04/2013.   Cycle 5 FOLFIRINOX 10/04/2013.   Cycle 6 FOLFIRINOX 10/18/2013   Restaging CT 10/22/2013 with no change in the soft tissue surrounding the pancreas/celiac axis, scattered indeterminate low-attenuation liver lesions are more conspicuous.  Cycle 7 FOLFIRINOX 11/08/2013  Cycle 8 FOLFIRINOX 11/22/2013  Cycle 9 FOLFIRINOX 12/06/2013  Cycle 10 FOLFIRI (oxaliplatin eliminated) 12/20/2013.  Cycle 11 FOLFIRI 01/03/2014  Restaging CT 01/12/2014-no change in the soft tissue posterior to the pancreas or an 8 mm hepatic metastasis  Cycle 12 FOLFIRI 01/17/2014.  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 Abdominal pain secondary to #1. Progressive.  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  Status post celiac block 03/31/2014.  Abdominal pain is markedly improved. Decrease MS Contin  to 90 mg every 12 hours  beginning 08/01/2014 (previously 90 mg every 8 hours). 3. Hypertension. 4. History of gout. 5. Nausea and vomiting following FOLFIRINOX chemotherapy.  Aloxi and Emend added with cycle 2.   Decadron added with cycle 3. 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 7. Hospitalization 08/24/2013 through 08/27/2013 with poorly controlled nausea/vomiting. Diarrhea during the hospitalization. 8. Skin hyperpigmentation and dryness secondary to 5-fluorouracil     Disposition: [name] appears well. Plan to proceed with cycle 6 gemcitabine/Abraxane beginning today as scheduled. CA-19-9 further improved on 07/11/2014.  He has progressive anemia likely secondary to chemotherapy. He is largely asymptomatic. He declines transfusion support at this time. He will have a repeat CBC next week with chemotherapy. Signs/symptoms suggestive of progressive anemia reviewed at today's visit.  Pain is very well-controlled on MS Contin. He rarely takes breakthrough pain medication. We will decrease the MS Contin to 90 mg every 12 hours.  He will return for a follow-up visit and cycle 7 gemcitabine/Abraxane in 3 weeks. He will contact the office in the interim with any problems.  Plan reviewed with [name].      Ned Card ANP/GNP-BC   08/01/2014  11:19 AM

## 2014-08-02 ENCOUNTER — Other Ambulatory Visit: Payer: Self-pay | Admitting: *Deleted

## 2014-08-02 DIAGNOSIS — C259 Malignant neoplasm of pancreas, unspecified: Secondary | ICD-10-CM

## 2014-08-02 LAB — FERRITIN: Ferritin: 574 ng/mL — ABNORMAL HIGH (ref 22–322)

## 2014-08-02 LAB — CANCER ANTIGEN 19-9: CA 19-9: 159.1 U/mL — ABNORMAL HIGH (ref <35.0)

## 2014-08-02 NOTE — Progress Notes (Signed)
Added ferritin to labs from yesterday 08/01/14.  Per Elby Showers. Marcello Moores, NP.  Orders entered.

## 2014-08-10 ENCOUNTER — Telehealth: Payer: Self-pay | Admitting: *Deleted

## 2014-08-10 ENCOUNTER — Other Ambulatory Visit: Payer: Medicare Other

## 2014-08-10 ENCOUNTER — Ambulatory Visit: Payer: Medicare Other

## 2014-08-10 NOTE — Telephone Encounter (Signed)
Called and left VM to follow up on "no show" for lab/chemo today. Requested he return call to reschedule or let us know what he needs.

## 2014-08-21 ENCOUNTER — Other Ambulatory Visit: Payer: Self-pay | Admitting: Oncology

## 2014-08-22 ENCOUNTER — Ambulatory Visit (HOSPITAL_BASED_OUTPATIENT_CLINIC_OR_DEPARTMENT_OTHER): Payer: Medicare Other | Admitting: Nurse Practitioner

## 2014-08-22 ENCOUNTER — Other Ambulatory Visit (HOSPITAL_BASED_OUTPATIENT_CLINIC_OR_DEPARTMENT_OTHER): Payer: Medicare Other

## 2014-08-22 ENCOUNTER — Telehealth: Payer: Self-pay | Admitting: Oncology

## 2014-08-22 ENCOUNTER — Ambulatory Visit (HOSPITAL_BASED_OUTPATIENT_CLINIC_OR_DEPARTMENT_OTHER): Payer: Medicare Other

## 2014-08-22 VITALS — BP 136/78 | HR 76 | Temp 98.4°F | Resp 17 | Ht 72.0 in | Wt 165.2 lb

## 2014-08-22 DIAGNOSIS — C787 Secondary malignant neoplasm of liver and intrahepatic bile duct: Secondary | ICD-10-CM

## 2014-08-22 DIAGNOSIS — Z5111 Encounter for antineoplastic chemotherapy: Secondary | ICD-10-CM

## 2014-08-22 DIAGNOSIS — R109 Unspecified abdominal pain: Secondary | ICD-10-CM | POA: Diagnosis not present

## 2014-08-22 DIAGNOSIS — C259 Malignant neoplasm of pancreas, unspecified: Secondary | ICD-10-CM

## 2014-08-22 DIAGNOSIS — Z8719 Personal history of other diseases of the digestive system: Secondary | ICD-10-CM

## 2014-08-22 LAB — COMPREHENSIVE METABOLIC PANEL (CC13)
ALBUMIN: 3.8 g/dL (ref 3.5–5.0)
ALK PHOS: 86 U/L (ref 40–150)
ALT: 6 U/L (ref 0–55)
AST: 15 U/L (ref 5–34)
Anion Gap: 8 mEq/L (ref 3–11)
BUN: 9.3 mg/dL (ref 7.0–26.0)
CO2: 26 meq/L (ref 22–29)
Calcium: 9.1 mg/dL (ref 8.4–10.4)
Chloride: 107 mEq/L (ref 98–109)
Creatinine: 1 mg/dL (ref 0.7–1.3)
EGFR: >90 mL/min/{1.73_m2} (ref >90)
Glucose: 91 mg/dl (ref 70–140)
Potassium: 4.4 mEq/L (ref 3.5–5.1)
Sodium: 142 mEq/L (ref 136–145)
Total Bilirubin: 0.51 mg/dL (ref 0.20–1.20)
Total Protein: 6.4 g/dL (ref 6.4–8.3)

## 2014-08-22 LAB — CBC WITH DIFFERENTIAL/PLATELET
BASO%: 0.4 % (ref 0.0–2.0)
Basophils Absolute: 0 10*3/uL (ref 0.0–0.1)
EOS%: 2.2 % (ref 0.0–7.0)
Eosinophils Absolute: 0.2 10*3/uL (ref 0.0–0.5)
HCT: 30.3 % — ABNORMAL LOW (ref 38.4–49.9)
HGB: 9.5 g/dL — ABNORMAL LOW (ref 13.0–17.1)
LYMPH%: 27.6 % (ref 14.0–49.0)
MCH: 26.2 pg — ABNORMAL LOW (ref 27.2–33.4)
MCHC: 31.5 g/dL — ABNORMAL LOW (ref 32.0–36.0)
MCV: 83.3 fL (ref 79.3–98.0)
MONO#: 0.9 10*3/uL (ref 0.1–0.9)
MONO%: 12.5 % (ref 0.0–14.0)
NEUT#: 4 10*3/uL (ref 1.5–6.5)
NEUT%: 57.3 % (ref 39.0–75.0)
Platelets: 239 10*3/uL (ref 140–400)
RBC: 3.64 10*6/uL — ABNORMAL LOW (ref 4.20–5.82)
RDW: 26.4 % — ABNORMAL HIGH (ref 11.0–14.6)
WBC: 7.1 10*3/uL (ref 4.0–10.3)
lymph#: 2 10*3/uL (ref 0.9–3.3)

## 2014-08-22 LAB — HOLD TUBE, BLOOD BANK

## 2014-08-22 MED ORDER — SODIUM CHLORIDE 0.9 % IV SOLN
Freq: Once | INTRAVENOUS | Status: AC
  Administered 2014-08-22: 12:00:00 via INTRAVENOUS
  Filled 2014-08-22: qty 4

## 2014-08-22 MED ORDER — SODIUM CHLORIDE 0.9 % IV SOLN
Freq: Once | INTRAVENOUS | Status: AC
  Administered 2014-08-22: 12:00:00 via INTRAVENOUS

## 2014-08-22 MED ORDER — SODIUM CHLORIDE 0.9 % IV SOLN
800.0000 mg/m2 | Freq: Once | INTRAVENOUS | Status: AC
  Administered 2014-08-22: 1558 mg via INTRAVENOUS
  Filled 2014-08-22: qty 40.98

## 2014-08-22 MED ORDER — SODIUM CHLORIDE 0.9 % IJ SOLN
10.0000 mL | INTRAMUSCULAR | Status: DC | PRN
  Administered 2014-08-22: 10 mL via INTRAVENOUS
  Filled 2014-08-22: qty 10

## 2014-08-22 MED ORDER — PACLITAXEL PROTEIN-BOUND CHEMO INJECTION 100 MG
100.0000 mg/m2 | Freq: Once | INTRAVENOUS | Status: AC
  Administered 2014-08-22: 200 mg via INTRAVENOUS
  Filled 2014-08-22: qty 40

## 2014-08-22 MED ORDER — HEPARIN SOD (PORK) LOCK FLUSH 100 UNIT/ML IV SOLN
500.0000 [IU] | Freq: Once | INTRAVENOUS | Status: AC
  Administered 2014-08-22: 500 [IU] via INTRAVENOUS
  Filled 2014-08-22: qty 5

## 2014-08-22 NOTE — Patient Instructions (Signed)
Nanoparticle Albumin-Bound Paclitaxel injection What is this medicine? NANOPARTICLE ALBUMIN-BOUND PACLITAXEL (Na no PAHR ti kuhl al BYOO muhn-bound PAK li TAX el) is a chemotherapy drug. It targets fast dividing cells, like cancer cells, and causes these cells to die. This medicine is used to treat advanced breast cancer and advanced lung cancer. This medicine may be used for other purposes; ask your health care provider or pharmacist if you have questions. COMMON BRAND NAME(S): Abraxane What should I tell my health care provider before I take this medicine? They need to know if you have any of these conditions: -kidney disease -liver disease -low blood counts, like low platelets, red blood cells, or white blood cells -recent or ongoing radiation therapy -an unusual or allergic reaction to paclitaxel, albumin, other chemotherapy, other medicines, foods, dyes, or preservatives -pregnant or trying to get pregnant -breast-feeding How should I use this medicine? This drug is given as an infusion into a vein. It is administered in a hospital or clinic by a specially trained health care professional. Talk to your pediatrician regarding the use of this medicine in children. Special care may be needed. Overdosage: If you think you have taken too much of this medicine contact a poison control center or emergency room at once. NOTE: This medicine is only for you. Do not share this medicine with others. What if I miss a dose? It is important not to miss your dose. Call your doctor or health care professional if you are unable to keep an appointment. What may interact with this medicine? -cyclosporine -diazepam -ketoconazole -medicines to increase blood counts like filgrastim, pegfilgrastim, sargramostim -other chemotherapy drugs like cisplatin, doxorubicin, epirubicin, etoposide, teniposide, vincristine -quinidine -testosterone -vaccines -verapamil Talk to your doctor or health care professional  before taking any of these medicines: -acetaminophen -aspirin -ibuprofen -ketoprofen -naproxen This list may not describe all possible interactions. Give your health care provider a list of all the medicines, herbs, non-prescription drugs, or dietary supplements you use. Also tell them if you smoke, drink alcohol, or use illegal drugs. Some items may interact with your medicine. What should I watch for while using this medicine? Your condition will be monitored carefully while you are receiving this medicine. You will need important blood work done while you are taking this medicine. This drug may make you feel generally unwell. This is not uncommon, as chemotherapy can affect healthy cells as well as cancer cells. Report any side effects. Continue your course of treatment even though you feel ill unless your doctor tells you to stop. In some cases, you may be given additional medicines to help with side effects. Follow all directions for their use. Call your doctor or health care professional for advice if you get a fever, chills or sore throat, or other symptoms of a cold or flu. Do not treat yourself. This drug decreases your body's ability to fight infections. Try to avoid being around people who are sick. This medicine may increase your risk to bruise or bleed. Call your doctor or health care professional if you notice any unusual bleeding. Be careful brushing and flossing your teeth or using a toothpick because you may get an infection or bleed more easily. If you have any dental work done, tell your dentist you are receiving this medicine. Avoid taking products that contain aspirin, acetaminophen, ibuprofen, naproxen, or ketoprofen unless instructed by your doctor. These medicines may hide a fever. Do not become pregnant while taking this medicine. Women should inform their doctor if they wish  to become pregnant or think they might be pregnant. There is a potential for serious side effects to  an unborn child. Talk to your health care professional or pharmacist for more information. Do not breast-feed an infant while taking this medicine. Men are advised not to father a child while receiving this medicine. What side effects may I notice from receiving this medicine? Side effects that you should report to your doctor or health care professional as soon as possible: -allergic reactions like skin rash, itching or hives, swelling of the face, lips, or tongue -low blood counts - This drug may decrease the number of white blood cells, red blood cells and platelets. You may be at increased risk for infections and bleeding. -signs of infection - fever or chills, cough, sore throat, pain or difficulty passing urine -signs of decreased platelets or bleeding - bruising, pinpoint red spots on the skin, black, tarry stools, nosebleeds -signs of decreased red blood cells - unusually weak or tired, fainting spells, lightheadedness -breathing problems -changes in vision -chest pain -high or low blood pressure -mouth sores -nausea and vomiting -pain, swelling, redness or irritation at the injection site -pain, tingling, numbness in the hands or feet -slow or irregular heartbeat -swelling of the ankle, feet, hands Side effects that usually do not require medical attention (report to your doctor or health care professional if they continue or are bothersome): -aches, pains -changes in the color of fingernails -diarrhea -hair loss -loss of appetite This list may not describe all possible side effects. Call your doctor for medical advice about side effects. You may report side effects to FDA at 1-800-FDA-1088. Where should I keep my medicine? This drug is given in a hospital or clinic and will not be stored at home. NOTE: This sheet is a summary. It may not cover all possible information. If you have questions about this medicine, talk to your doctor, pharmacist, or health care provider.  2015,  [employer]vier/Gold Standard. (2012-03-16 16:48:50) Gemcitabine injection What is this medicine? GEMCITABINE (jem SIT a been) is a chemotherapy drug. This medicine is used to treat many types of cancer like breast cancer, lung cancer, pancreatic cancer, and ovarian cancer. This medicine may be used for other purposes; ask your health care provider or pharmacist if you have questions. COMMON BRAND NAME(S): Gemzar What should I tell my health care provider before I take this medicine? They need to know if you have any of these conditions: -blood disorders -infection -kidney disease -liver disease -recent or ongoing radiation therapy -an unusual or allergic reaction to gemcitabine, other chemotherapy, other medicines, foods, dyes, or preservatives -pregnant or trying to get pregnant -breast-feeding How should I use this medicine? This drug is given as an infusion into a vein. It is administered in a hospital or clinic by a specially trained health care professional. Talk to your pediatrician regarding the use of this medicine in children. Special care may be needed. Overdosage: If you think you have taken too much of this medicine contact a poison control center or emergency room at once. NOTE: This medicine is only for you. Do not share this medicine with others. What if I miss a dose? It is important not to miss your dose. Call your doctor or health care professional if you are unable to keep an appointment. What may interact with this medicine? -medicines to increase blood counts like filgrastim, pegfilgrastim, sargramostim -some other chemotherapy drugs like cisplatin -vaccines Talk to your doctor or health care professional before taking any  of these medicines: -acetaminophen -aspirin -ibuprofen -ketoprofen -naproxen This list may not describe all possible interactions. Give your health care provider a list of all the medicines, herbs, non-prescription drugs, or dietary supplements you  use. Also tell them if you smoke, drink alcohol, or use illegal drugs. Some items may interact with your medicine. What should I watch for while using this medicine? Visit your doctor for checks on your progress. This drug may make you feel generally unwell. This is not uncommon, as chemotherapy can affect healthy cells as well as cancer cells. Report any side effects. Continue your course of treatment even though you feel ill unless your doctor tells you to stop. In some cases, you may be given additional medicines to help with side effects. Follow all directions for their use. Call your doctor or health care professional for advice if you get a fever, chills or sore throat, or other symptoms of a cold or flu. Do not treat yourself. This drug decreases your body's ability to fight infections. Try to avoid being around people who are sick. This medicine may increase your risk to bruise or bleed. Call your doctor or health care professional if you notice any unusual bleeding. Be careful brushing and flossing your teeth or using a toothpick because you may get an infection or bleed more easily. If you have any dental work done, tell your dentist you are receiving this medicine. Avoid taking products that contain aspirin, acetaminophen, ibuprofen, naproxen, or ketoprofen unless instructed by your doctor. These medicines may hide a fever. Women should inform their doctor if they wish to become pregnant or think they might be pregnant. There is a potential for serious side effects to an unborn child. Talk to your health care professional or pharmacist for more information. Do not breast-feed an infant while taking this medicine. What side effects may I notice from receiving this medicine? Side effects that you should report to your doctor or health care professional as soon as possible: -allergic reactions like skin rash, itching or hives, swelling of the face, lips, or tongue -low blood counts - this  medicine may decrease the number of white blood cells, red blood cells and platelets. You may be at increased risk for infections and bleeding. -signs of infection - fever or chills, cough, sore throat, pain or difficulty passing urine -signs of decreased platelets or bleeding - bruising, pinpoint red spots on the skin, black, tarry stools, blood in the urine -signs of decreased red blood cells - unusually weak or tired, fainting spells, lightheadedness -breathing problems -chest pain -mouth sores -nausea and vomiting -pain, swelling, redness at site where injected -pain, tingling, numbness in the hands or feet -stomach pain -swelling of ankles, feet, hands -unusual bleeding Side effects that usually do not require medical attention (report to your doctor or health care professional if they continue or are bothersome): -constipation -diarrhea -hair loss -loss of appetite -stomach upset This list may not describe all possible side effects. Call your doctor for medical advice about side effects. You may report side effects to FDA at 1-800-FDA-1088. Where should I keep my medicine? This drug is given in a hospital or clinic and will not be stored at home. NOTE: This sheet is a summary. It may not cover all possible information. If you have questions about this medicine, talk to your doctor, pharmacist, or health care provider.  2015, [employer]vier/Gold Standard. (2007-06-02 18:45:54)

## 2014-08-22 NOTE — Telephone Encounter (Signed)
Gave and printed appt sched and avs for pt for July and Aug °

## 2014-08-22 NOTE — Progress Notes (Signed)
[facility] OFFICE PROGRESS NOTE   Diagnosis:  Pancreas cancer  INTERVAL HISTORY:   Ray [name] returns as scheduled. He continues gemcitabine/Abraxane. He denies nausea/vomiting. No mouth sores. No diarrhea or constipation. No fever or rash following treatment. He has stable neuropathy symptoms. He continues to note excessive dry skin. He is applying lotion liberally. 2 days a week he notes that he is fatigued. Pain is well-controlled. He is currently taking MS Contin 90 mg every 8 hours. He rarely takes breakthrough pain medication.  Objective:  Vital signs in last 24 hours:  Blood pressure 136/78, pulse 76, temperature 98.4 F (36.9 C), temperature source Oral, resp. rate 17, height 6' (1.829 m), weight 165 lb 3.2 oz (74.934 kg), SpO2 100 %.    HEENT: No thrush or ulcers. Resp: Lungs clear bilaterally. Cardio: Regular rate and rhythm. GI: Abdomen soft and nontender. No hepatomegaly. Vascular: No leg edema. Skin: Skin is markedly dry appearing.  Port-A-Cath without erythema.   Lab Results:  Lab Results  Component Value Date   WBC 7.1 08/22/2014   HGB 9.5* 08/22/2014   HCT 30.3* 08/22/2014   MCV 83.3 08/22/2014   PLT 239 08/22/2014   NEUTROABS 4.0 08/22/2014    Imaging:  No results found.  Medications: I have reviewed the patient's current medications.  Assessment/Plan: 1. Pancreas cancer.  07/06/2013 ,CA 19-9- 2067.   CT abdomen/pelvis 07/09/2013 with interval development of amorphous soft tissue circumferentially encasing the celiac axis and tracking caudally down toward the SMA. Associated stable irregular mild distention of the main pancreatic duct in the body and tail of the pancreas with no discrete mass lesion seen. 2.2 x 1.4 cm portacaval lymph node in the hepatoduodenal ligament had increased in size from 1.3 x 1.1 cm previously. Other scattered small lymph nodes seen in the hepatoduodenal ligament.   Endoscopic ultrasound 07/15/2013 with  a very vague masslike lesion in the region of the uncinate pancreas. Fine needle aspiration showed atypical cells consistent with adenocarcinoma.   PET scan 07/29/2013 with hypermetabolic soft tissue posterior to the pancreas with an adjacent hypermetabolic lymph node, no hypermetabolic pancreas mass, no evidence of distant metastatic disease   Cycle 1 FOLFIRINOX 08/02/2013.   Cycle 2 FOLFIRINOX 08/16/2013.   Cycle 3 FOLFIRINOX 09/06/2013.   Cycle 4 FOLFIRINOX 09/21/2013.   CA 19-9 improved on 10/04/2013.   Cycle 5 FOLFIRINOX 10/04/2013.   Cycle 6 FOLFIRINOX 10/18/2013   Restaging CT 10/22/2013 with no change in the soft tissue surrounding the pancreas/celiac axis, scattered indeterminate low-attenuation liver lesions are more conspicuous.  Cycle 7 FOLFIRINOX 11/08/2013  Cycle 8 FOLFIRINOX 11/22/2013  Cycle 9 FOLFIRINOX 12/06/2013  Cycle 10 FOLFIRI (oxaliplatin eliminated) 12/20/2013.  Cycle 11 FOLFIRI 01/03/2014  Restaging CT 01/12/2014-no change in the soft tissue posterior to the pancreas or an 8 mm hepatic metastasis  Cycle 12 FOLFIRI 01/17/2014.  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, soft tissue density encasing the celiac axis, a possible 9 mm hepatic metastasis  Initiation of gemcitabine/Abraxane on a day one/day 8 schedule 04/11/2014  Cycle 2 gemcitabine/Abraxane 05/02/2014, 05/09/2014  CA-19-9 improved 05/30/2014  Cycle 3 gemcitabine/Abraxane 05/30/2014, 06/06/2014  Cycle 4 gemcitabine/Abraxane 06/20/2014, 06/27/2014  Cycle 5 gemcitabine/Abraxane 07/11/2014, 07/18/2014  Cycle 6 gemcitabine/Abraxane 08/01/2014 (day 1 only; patient canceled day 8)  CA-19-9 improved 08/01/2014  Cycle 7 gemcitabine/Abraxane 08/22/2014 2. Abdominal pain secondary to #1. Progressive.  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  Status post  celiac block 03/31/2014.  Abdominal pain is markedly improved. Decrease MS Contin to 90 mg every 12 hours beginning 08/01/2014 (previously 90 mg every 8 hours). He had increased pain with the dose decrease. He has resumed MS Contin 90 mg every 8 hours. Abdominal pain is well-controlled. 3. Hypertension. 4. History of gout. 5. Nausea and vomiting following FOLFIRINOX chemotherapy.  Aloxi and Emend added with cycle 2.   Decadron added with cycle 3. 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 7. Hospitalization 08/24/2013 through 08/27/2013 with poorly controlled nausea/vomiting. Diarrhea during the hospitalization. 8. Skin hyperpigmentation and dryness secondary to 5-fluorouracil    Disposition: Mr. Van appears stable. He has completed 6 cycles of gemcitabine/Abraxane. The CA-19-9 was further improved on 08/01/2014. Plan to proceed with cycle 7 today as scheduled. He will return for the day 8 treatment in one week. He will return for a follow-up visit in 3 weeks. He will contact the office in the interim with any problems.  Plan reviewed with [name].    Ned Card ANP/GNP-BC   08/22/2014  10:29 AM

## 2014-08-23 LAB — CANCER ANTIGEN 19-9: CA 19-9: 265.1 U/mL — ABNORMAL HIGH (ref <35.0)

## 2014-08-29 ENCOUNTER — Ambulatory Visit: Payer: Medicare Other

## 2014-08-29 ENCOUNTER — Telehealth: Payer: Self-pay | Admitting: *Deleted

## 2014-08-29 ENCOUNTER — Other Ambulatory Visit: Payer: Medicare Other

## 2014-08-29 NOTE — Telephone Encounter (Signed)
PT. IS VERY WEAK. NO FEVER,NAUSEA OR VOMITING, AND NO DIARRHEA. HE IS HAVING INCREASED PAIN AT A SCALE OF FIVE BUT MSIR REDUCES THE PAIN TO A ONE. PT.'S APPETITE IS NORMAL AND HE IS FORCING FLUIDS. VERBAL ORDER AND READ BACK TO DR. Charlotte SKIP TODAY'S TREATMENT. PT. NEEDS TO KEEP HIS NEXT APPOINTMENT WHICH IS ON 09/12/14. PT. TO CALL THIS OFFICE IF ANY PROBLEMS ARISE BEFORE HIS NEXT APPOINTMENT. NOTIFIED PT. OF THE ABOVE INSTRUCTIONS. HE VOICES UNDERSTANDING.

## 2014-08-30 ENCOUNTER — Telehealth: Payer: Self-pay | Admitting: *Deleted

## 2014-08-30 NOTE — Telephone Encounter (Signed)
Oncology Nurse Navigator Documentation  Oncology Nurse Navigator Flowsheets 08/30/2014  Navigator Encounter Type Telephone-F/U on pain  Treatment Phase Tx held this week  Barriers/Navigation Needs No barriers at this time  Time Spent with Patient 5  Ray [name] reports his pain is much improved today-has not required a breakthrough pain med. Appreciates call. He will follow up on 09/12/14 as scheduled.

## 2014-09-05 ENCOUNTER — Telehealth: Payer: Self-pay | Admitting: *Deleted

## 2014-09-05 NOTE — Telephone Encounter (Signed)
Received fax from [facility] for [facility]. Forms taken to managed care for completion.

## 2014-09-12 ENCOUNTER — Encounter: Payer: Self-pay | Admitting: Oncology

## 2014-09-12 ENCOUNTER — Telehealth: Payer: Self-pay | Admitting: Oncology

## 2014-09-12 ENCOUNTER — Ambulatory Visit (HOSPITAL_BASED_OUTPATIENT_CLINIC_OR_DEPARTMENT_OTHER): Payer: Medicare Other | Admitting: Oncology

## 2014-09-12 ENCOUNTER — Other Ambulatory Visit: Payer: Self-pay | Admitting: *Deleted

## 2014-09-12 ENCOUNTER — Ambulatory Visit: Payer: Medicare Other

## 2014-09-12 ENCOUNTER — Other Ambulatory Visit (HOSPITAL_BASED_OUTPATIENT_CLINIC_OR_DEPARTMENT_OTHER): Payer: Medicare Other

## 2014-09-12 VITALS — BP 141/90 | HR 100 | Temp 98.4°F | Resp 19 | Ht 72.0 in | Wt 155.4 lb

## 2014-09-12 DIAGNOSIS — C787 Secondary malignant neoplasm of liver and intrahepatic bile duct: Secondary | ICD-10-CM

## 2014-09-12 DIAGNOSIS — R634 Abnormal weight loss: Secondary | ICD-10-CM

## 2014-09-12 DIAGNOSIS — Z86718 Personal history of other venous thrombosis and embolism: Secondary | ICD-10-CM | POA: Diagnosis not present

## 2014-09-12 DIAGNOSIS — C251 Malignant neoplasm of body of pancreas: Secondary | ICD-10-CM

## 2014-09-12 DIAGNOSIS — R109 Unspecified abdominal pain: Secondary | ICD-10-CM

## 2014-09-12 DIAGNOSIS — R234 Changes in skin texture: Secondary | ICD-10-CM | POA: Diagnosis not present

## 2014-09-12 DIAGNOSIS — C259 Malignant neoplasm of pancreas, unspecified: Secondary | ICD-10-CM

## 2014-09-12 LAB — CBC WITH DIFFERENTIAL/PLATELET
BASO%: 0.3 % (ref 0.0–2.0)
BASOS ABS: 0 10*3/uL (ref 0.0–0.1)
EOS ABS: 0.1 10*3/uL (ref 0.0–0.5)
EOS%: 0.9 % (ref 0.0–7.0)
HCT: 34.1 % — ABNORMAL LOW (ref 38.4–49.9)
HGB: 10.9 g/dL — ABNORMAL LOW (ref 13.0–17.1)
LYMPH#: 1.8 10*3/uL (ref 0.9–3.3)
LYMPH%: 30.5 % (ref 14.0–49.0)
MCH: 26.3 pg — ABNORMAL LOW (ref 27.2–33.4)
MCHC: 32 g/dL (ref 32.0–36.0)
MCV: 82.2 fL (ref 79.3–98.0)
MONO#: 0.7 10*3/uL (ref 0.1–0.9)
MONO%: 12.9 % (ref 0.0–14.0)
NEUT#: 3.2 10*3/uL (ref 1.5–6.5)
NEUT%: 55.4 % (ref 39.0–75.0)
NRBC: 0 % (ref 0–0)
PLATELETS: 165 10*3/uL (ref 140–400)
RBC: 4.15 10*6/uL — ABNORMAL LOW (ref 4.20–5.82)
RDW: 21.5 % — ABNORMAL HIGH (ref 11.0–14.6)
WBC: 5.7 10*3/uL (ref 4.0–10.3)

## 2014-09-12 LAB — COMPREHENSIVE METABOLIC PANEL (CC13)
ALBUMIN: 4.3 g/dL (ref 3.5–5.0)
ALT: 8 U/L (ref 0–55)
AST: 13 U/L (ref 5–34)
Alkaline Phosphatase: 84 U/L (ref 40–150)
Anion Gap: 10 mEq/L (ref 3–11)
BILIRUBIN TOTAL: 0.54 mg/dL (ref 0.20–1.20)
BUN: 10.1 mg/dL (ref 7.0–26.0)
CO2: 26 mEq/L (ref 22–29)
Calcium: 9.4 mg/dL (ref 8.4–10.4)
Chloride: 102 mEq/L (ref 98–109)
Creatinine: 1.1 mg/dL (ref 0.7–1.3)
EGFR: 82 mL/min/{1.73_m2} — ABNORMAL LOW (ref >90)
GLUCOSE: 137 mg/dL (ref 70–140)
POTASSIUM: 4.3 meq/L (ref 3.5–5.1)
Sodium: 138 mEq/L (ref 136–145)
Total Protein: 6.9 g/dL (ref 6.4–8.3)

## 2014-09-12 LAB — TECHNOLOGIST REVIEW

## 2014-09-12 MED ORDER — MORPHINE SULFATE 30 MG PO TABS: 30.0000 mg | ORAL_TABLET | ORAL | Status: DC | PRN

## 2014-09-12 MED ORDER — MORPHINE SULFATE ER 30 MG PO TBCR: 90.0000 mg | EXTENDED_RELEASE_TABLET | Freq: Three times a day (TID) | ORAL | Status: DC

## 2014-09-12 NOTE — Progress Notes (Signed)
Wife called and left message if forms were ready. They were in box, so I called to advise her I would fax to [employer]  [phone] and leave her copy at front desk with ms. wilma

## 2014-09-12 NOTE — Telephone Encounter (Signed)
added appt per pof....pt will get sched from chemo

## 2014-09-12 NOTE — Progress Notes (Signed)
[facility] OFFICE PROGRESS NOTE   Diagnosis: Pancreas cancer  INTERVAL HISTORY:   Mr. Dino was last treated with gemcitabine/Abraxane on 08/22/2014. Beginning on day 3 he developed "burning" diffusely over his skin. Over the past week he has developed dry desquamation of the skin over the trunk and extremities. No erythema. No mouth or eye sores. He now has pain in the subxiphoid region. He stayed in bed most of last week. He has felt better for the past few days.  Objective:  Vital signs in last 24 hours:  Blood pressure 141/90, pulse 100, temperature 98.4 F (36.9 C), temperature source Oral, resp. rate 19, height 6' (1.829 m), weight 155 lb 6.4 oz (70.489 kg), SpO2 100 %.    HEENT: No thrush or ulcers Resp: Lungs clear bilaterally Cardio: Regular rate and rhythm GI: No hepatomegaly, no mass, nontender Vascular: No leg edema  Skin: Hyperpigmentation with dry desquamation over the trunk, arms, and legs diffusely. The desquamation spares the hands and face   Portacath/PICC-without erythema  Lab Results:  Lab Results  Component Value Date   WBC 5.7 09/12/2014   HGB 10.9* 09/12/2014   HCT 34.1* 09/12/2014   MCV 82.2 09/12/2014   PLT 165 09/12/2014   NEUTROABS 3.2 09/12/2014   08/22/2014: CA 19-9-265.1    Imaging:  No results found.  Medications: I have reviewed the patient's current medications.  Assessment/Plan: 1. Pancreas cancer.  07/06/2013 ,CA 19-9- 2067.   CT abdomen/pelvis 07/09/2013 with interval development of amorphous soft tissue circumferentially encasing the celiac axis and tracking caudally down toward the SMA. Associated stable irregular mild distention of the main pancreatic duct in the body and tail of the pancreas with no discrete mass lesion seen. 2.2 x 1.4 cm portacaval lymph node in the hepatoduodenal ligament had increased in size from 1.3 x 1.1 cm previously. Other scattered small lymph nodes seen in the hepatoduodenal  ligament.   Endoscopic ultrasound 07/15/2013 with a very vague masslike lesion in the region of the uncinate pancreas. Fine needle aspiration showed atypical cells consistent with adenocarcinoma.   PET scan 07/29/2013 with hypermetabolic soft tissue posterior to the pancreas with an adjacent hypermetabolic lymph node, no hypermetabolic pancreas mass, no evidence of distant metastatic disease   Cycle 1 FOLFIRINOX 08/02/2013.   Cycle 2 FOLFIRINOX 08/16/2013.   Cycle 3 FOLFIRINOX 09/06/2013.   Cycle 4 FOLFIRINOX 09/21/2013.   CA 19-9 improved on 10/04/2013.   Cycle 5 FOLFIRINOX 10/04/2013.   Cycle 6 FOLFIRINOX 10/18/2013   Restaging CT 10/22/2013 with no change in the soft tissue surrounding the pancreas/celiac axis, scattered indeterminate low-attenuation liver lesions are more conspicuous.  Cycle 7 FOLFIRINOX 11/08/2013  Cycle 8 FOLFIRINOX 11/22/2013  Cycle 9 FOLFIRINOX 12/06/2013  Cycle 10 FOLFIRI (oxaliplatin eliminated) 12/20/2013.  Cycle 11 FOLFIRI 01/03/2014  Restaging CT 01/12/2014-no change in the soft tissue posterior to the pancreas or an 8 mm hepatic metastasis  Cycle 12 FOLFIRI 01/17/2014.  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, soft tissue density encasing the celiac axis, a possible 9 mm hepatic metastasis  Initiation of gemcitabine/Abraxane on a day one/day 8 schedule 04/11/2014  Cycle 2 gemcitabine/Abraxane 05/02/2014, 05/09/2014  CA-19-9 improved 05/30/2014  Cycle 3 gemcitabine/Abraxane 05/30/2014, 06/06/2014  Cycle 4 gemcitabine/Abraxane 06/20/2014, 06/27/2014  Cycle 5 gemcitabine/Abraxane 07/11/2014, 07/18/2014  Cycle 6 gemcitabine/Abraxane 08/01/2014 (day 1 only; patient canceled day 8)  CA-19-9 improved 08/01/2014  Cycle 7  gemcitabine/Abraxane 08/22/2014 2. Abdominal pain secondary to #1.   Status post a  celiac block 08/05/2013.  Status post celiac block 03/31/2014.  Abdominal pain is markedly improved. Decrease MS Contin to 90 mg every 12 hours beginning 08/01/2014 (previously 90 mg every 8 hours). He had increased pain with the dose decrease. He has resumed MS Contin 90 mg every 8 hours. Abdominal pain is well-controlled. 3. Hypertension. 4. History of gout. 5. Nausea and vomiting following FOLFIRINOX chemotherapy.  Aloxi and Emend added with cycle 2.   Decadron added with cycle 3. 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 7. Hospitalization 08/24/2013 through 08/27/2013 with poorly controlled nausea/vomiting. Diarrhea during the hospitalization. 8. History of Skin hyperpigmentation and dryness secondary to 5-fluorouracil 9. Diffuse dry desquamation of the skin following gemcitabine/Abraxane 08/22/2014     Disposition:  Ray [name] was last treated with gemcitabine/Abraxane on 08/22/2014. He developed diffuse dry desquamation following this treatment. The etiology of the rash is unclear, potentially related to gemcitabine or Abraxane.  He has lost weight over the past month and continues to have abdominal pain. The CA 19-9 was slightly higher last month. We decided to place chemotherapy on hold. He will undergo a restaging CT evaluation prior to a follow-up visit in one week.  Betsy Coder, MD  09/12/2014  10:46 AM

## 2014-09-12 NOTE — Progress Notes (Signed)
I placed [employer] forms for wife Onalee Hua on desk of nurse for [name]

## 2014-09-13 LAB — CANCER ANTIGEN 19-9: CA 19-9: 883.8 U/mL — ABNORMAL HIGH (ref <35.0)

## 2014-09-15 ENCOUNTER — Ambulatory Visit: Payer: Medicare Other | Admitting: Podiatry

## 2014-09-16 ENCOUNTER — Ambulatory Visit (HOSPITAL_COMMUNITY)
Admission: RE | Admit: 2014-09-16 | Discharge: 2014-09-16 | Disposition: A | Discharge status: Home / Self Care | Payer: Medicare Other | Source: Ambulatory Visit | Attending: Oncology | Admitting: Oncology

## 2014-09-16 ENCOUNTER — Encounter (HOSPITAL_COMMUNITY): Payer: Self-pay

## 2014-09-16 DIAGNOSIS — C259 Malignant neoplasm of pancreas, unspecified: Secondary | ICD-10-CM | POA: Diagnosis present

## 2014-09-16 MED ORDER — IOHEXOL 300 MG/ML  SOLN
100.0000 mL | Freq: Once | INTRAMUSCULAR | Status: AC | PRN
  Administered 2014-09-16: 100 mL via INTRAVENOUS

## 2014-09-19 ENCOUNTER — Ambulatory Visit: Payer: Medicare Other

## 2014-09-19 ENCOUNTER — Other Ambulatory Visit: Payer: Medicare Other

## 2014-09-19 ENCOUNTER — Telehealth: Payer: Self-pay | Admitting: Oncology

## 2014-09-19 ENCOUNTER — Other Ambulatory Visit (HOSPITAL_BASED_OUTPATIENT_CLINIC_OR_DEPARTMENT_OTHER): Payer: Medicare Other

## 2014-09-19 ENCOUNTER — Ambulatory Visit (HOSPITAL_BASED_OUTPATIENT_CLINIC_OR_DEPARTMENT_OTHER): Payer: Medicare Other | Admitting: Nurse Practitioner

## 2014-09-19 VITALS — BP 147/92 | HR 87 | Temp 99.0°F | Resp 18 | Ht 72.0 in | Wt 153.1 lb

## 2014-09-19 DIAGNOSIS — C787 Secondary malignant neoplasm of liver and intrahepatic bile duct: Secondary | ICD-10-CM

## 2014-09-19 DIAGNOSIS — R109 Unspecified abdominal pain: Secondary | ICD-10-CM

## 2014-09-19 DIAGNOSIS — C251 Malignant neoplasm of body of pancreas: Secondary | ICD-10-CM

## 2014-09-19 DIAGNOSIS — R634 Abnormal weight loss: Secondary | ICD-10-CM | POA: Diagnosis not present

## 2014-09-19 DIAGNOSIS — Z86718 Personal history of other venous thrombosis and embolism: Secondary | ICD-10-CM

## 2014-09-19 DIAGNOSIS — R234 Changes in skin texture: Secondary | ICD-10-CM

## 2014-09-19 DIAGNOSIS — C259 Malignant neoplasm of pancreas, unspecified: Secondary | ICD-10-CM

## 2014-09-19 LAB — CBC WITH DIFFERENTIAL/PLATELET
BASO%: 0.5 % (ref 0.0–2.0)
Basophils Absolute: 0 10*3/uL (ref 0.0–0.1)
EOS ABS: 0 10*3/uL (ref 0.0–0.5)
EOS%: 0.5 % (ref 0.0–7.0)
HCT: 36.2 % — ABNORMAL LOW (ref 38.4–49.9)
HGB: 11.5 g/dL — ABNORMAL LOW (ref 13.0–17.1)
LYMPH%: 22.4 % (ref 14.0–49.0)
MCH: 26.3 pg — AB (ref 27.2–33.4)
MCHC: 31.8 g/dL — AB (ref 32.0–36.0)
MCV: 82.9 fL (ref 79.3–98.0)
MONO#: 0.5 10*3/uL (ref 0.1–0.9)
MONO%: 8.8 % (ref 0.0–14.0)
NEUT#: 3.9 10*3/uL (ref 1.5–6.5)
NEUT%: 67.8 % (ref 39.0–75.0)
Platelets: 199 10*3/uL (ref 140–400)
RBC: 4.37 10*6/uL (ref 4.20–5.82)
RDW: 22.1 % — ABNORMAL HIGH (ref 11.0–14.6)
WBC: 5.8 10*3/uL (ref 4.0–10.3)
lymph#: 1.3 10*3/uL (ref 0.9–3.3)

## 2014-09-19 NOTE — Telephone Encounter (Signed)
Gave patient avs report and appointments for September. Per patient no more tx at this time just f/u and referral to Dr. Lisbeth Renshaw.

## 2014-09-19 NOTE — Progress Notes (Addendum)
[name] OFFICE PROGRESS NOTE   Diagnosis:  Pancreas cancer  INTERVAL HISTORY:   Mr. Vanloan returns as scheduled. He overall is feeling better. He notes improvement in his skin. He is applying lotion. Appetite continues to be diminished. He notes he is taking more breakthrough pain medication.  Objective:  Vital signs in last 24 hours:  Blood pressure 147/92, pulse 87, temperature 99 F (37.2 C), temperature source Oral, resp. rate 18, height 6' (1.829 m), weight 153 lb 1.6 oz (69.446 kg), SpO2 100 %.    HEENT: No thrush or ulcers. Resp: Lungs clear bilaterally. Cardio: Regular rate and rhythm. GI: Abdomen soft and nontender. No organomegaly. No mass. Vascular: No leg edema.  Skin: Skin is diffusely dry, peeling. Port-A-Cath without erythema.    Lab Results:  Lab Results  Component Value Date   WBC 5.8 09/19/2014   HGB 11.5* 09/19/2014   HCT 36.2* 09/19/2014   MCV 82.9 09/19/2014   PLT 199 09/19/2014   NEUTROABS 3.9 09/19/2014    Imaging:  No results found.  Medications: I have reviewed the patient's current medications.  Assessment/Plan: 1. Pancreas cancer.  07/06/2013 ,CA 19-9- 2067.   CT abdomen/pelvis 07/09/2013 with interval development of amorphous soft tissue circumferentially encasing the celiac axis and tracking caudally down toward the SMA. Associated stable irregular mild distention of the main pancreatic duct in the body and tail of the pancreas with no discrete mass lesion seen. 2.2 x 1.4 cm portacaval lymph node in the hepatoduodenal ligament had increased in size from 1.3 x 1.1 cm previously. Other scattered small lymph nodes seen in the hepatoduodenal ligament.   Endoscopic ultrasound 07/15/2013 with a very vague masslike lesion in the region of the uncinate pancreas. Fine needle aspiration showed atypical cells consistent with adenocarcinoma.   PET scan 07/29/2013 with hypermetabolic soft tissue posterior to the pancreas with  an adjacent hypermetabolic lymph node, no hypermetabolic pancreas mass, no evidence of distant metastatic disease   Cycle 1 FOLFIRINOX 08/02/2013.   Cycle 2 FOLFIRINOX 08/16/2013.   Cycle 3 FOLFIRINOX 09/06/2013.   Cycle 4 FOLFIRINOX 09/21/2013.   CA 19-9 improved on 10/04/2013.   Cycle 5 FOLFIRINOX 10/04/2013.   Cycle 6 FOLFIRINOX 10/18/2013   Restaging CT 10/22/2013 with no change in the soft tissue surrounding the pancreas/celiac axis, scattered indeterminate low-attenuation liver lesions are more conspicuous.  Cycle 7 FOLFIRINOX 11/08/2013  Cycle 8 FOLFIRINOX 11/22/2013  Cycle 9 FOLFIRINOX 12/06/2013  Cycle 10 FOLFIRI (oxaliplatin eliminated) 12/20/2013.  Cycle 11 FOLFIRI 01/03/2014  Restaging CT 01/12/2014-no change in the soft tissue posterior to the pancreas or an 8 mm hepatic metastasis  Cycle 12 FOLFIRI 01/17/2014.  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, soft tissue density encasing the celiac axis, a possible 9 mm hepatic metastasis  Initiation of gemcitabine/Abraxane on a day one/day 8 schedule 04/11/2014  Cycle 2 gemcitabine/Abraxane 05/02/2014, 05/09/2014  CA-19-9 improved 05/30/2014  Cycle 3 gemcitabine/Abraxane 05/30/2014, 06/06/2014  Cycle 4 gemcitabine/Abraxane 06/20/2014, 06/27/2014  Cycle 5 gemcitabine/Abraxane 07/11/2014, 07/18/2014  Cycle 6 gemcitabine/Abraxane 08/01/2014 (day 1 only; patient canceled day 8)  CA-19-9 improved 08/01/2014  Cycle 7 gemcitabine/Abraxane 08/22/2014  CT abdomen/pelvis 09/16/2014 with no dominant pancreatic mass identified. Increase in retroperitoneal pancreatic soft tissue infiltration at the level of the celiac axis and superior mesenteric artery. 2. Abdominal pain secondary to #1.   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  Status post celiac  block 03/31/2014.  Abdominal  pain is markedly improved. Decrease MS Contin to 90 mg every 12 hours beginning 08/01/2014 (previously 90 mg every 8 hours). He had increased pain with the dose decrease. He has resumed MS Contin 90 mg every 8 hours. Abdominal pain is well-controlled. 3. Hypertension. 4. History of gout. 5. Nausea and vomiting following FOLFIRINOX chemotherapy.  Aloxi and Emend added with cycle 2.   Decadron added with cycle 3. 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 7. Hospitalization 08/24/2013 through 08/27/2013 with poorly controlled nausea/vomiting. Diarrhea during the hospitalization. 8. History of Skin hyperpigmentation and dryness secondary to 5-fluorouracil 9. Diffuse dry desquamation of the skin following gemcitabine/Abraxane 08/22/2014. Improved 09/19/2014.   Disposition: Mr. Dollar appears stable. [name] reviewed the recent restaging CT scan with Mr. Froning and his wife. They understand the scan shows evidence of local progression. [name] recommends radiation/Xeloda. We reviewed potential toxicities associated with Xeloda including myelosuppression, nausea, mouth sores, diarrhea, skin hyperpigmentation, hand-foot syndrome, sensitivity to sun. We made a referral to Dr. Lisbeth Renshaw. Mr. Anspach will return for a follow-up visit in approximately 3 weeks. He will contact the office in the interim with any problems.  Patient seen with [name].   Ned Card ANP/GNP-BC   09/19/2014  1:00 PM  This was a shared visit with Ned Card. We discussed the CT findings with Mr. Aida Puffer. The CA 19-9 is higher and there is CT evidence of disease progression. He has been treated with FOLFIRINOX and gemcitabine/Abraxane. I recommend radiation and concurrent capecitabine. We reviewed the potential toxicities associated with capecitabine and he agrees to proceed.  We will consider treatment with liposomal irinotecan or referral for a clinical trial if he progresses beyond the capecitabine and  radiation.  Julieanne Manson, M.D.

## 2014-09-21 ENCOUNTER — Encounter: Payer: Self-pay | Admitting: *Deleted

## 2014-09-21 DIAGNOSIS — C259 Malignant neoplasm of pancreas, unspecified: Secondary | ICD-10-CM

## 2014-09-21 MED ORDER — CAPECITABINE 500 MG PO TABS: 1500.0000 mg | ORAL_TABLET | Freq: Two times a day (BID) | ORAL | Status: DC

## 2014-09-21 NOTE — Progress Notes (Signed)
  Oncology Nurse Navigator Documentation    Navigator Encounter Type: Other (09/21/14 1704)  Faxed script for Xeloda to [facility] with demographic information and copy of insurance card. Due to begin treatment end of August. Will request collaborative nurse follow up on receipt and co pay.

## 2014-09-23 NOTE — Progress Notes (Addendum)
GI Location of Tu mor / Histology: Pancreas  Ray [name] presented  months ago with symptoms of: abdominal pain,nasuea ,vomiting  Biopsies of  (if applicable) revealed: Diagnosis 07/16/14: FINE NEEDLE ASPIRATION, NEEDLE ASPIRATION:NEEDLE ASPIRATION, ENDOSCOPIC,PANCREAS(SPECIMEN 1 OF 1 COLLECTED 07/15/13):ATYPICAL CELLS CONSISTENT WITH ADENOCARCINOMA,  07/06/2013 CA 19.9=2067  Past/Anticipated interventions by surgeon, if any: Dr. Clemens Catholic post celiac  Plexus block 03/31/2014., Status post a celiac block 08/05/2013.Endoscopic  U/S 07/15/13=very vague like mass like lesion in region of the ucinate pancreas,fine needle aspiration=atypicl cells consistent with adenocarcinoma   Past/Anticipated interventions by medical oncology, if any:Dr. Glori Bickers , has had Folfox, Folfiri, Gemcitabine/Abraxane, CTY 09/16/14 showed local progression,  diffuse dry desquamation of skin following gemcitabine/Abraxane 08/22/14 improved 09/19/14; recommend Xeloda with Radiation, f/u   Weight changes, if any: loss 18 lbs past 2 months  Bowel/Bladder complaints, if any: almost back to regular 4 bowel movements, bladder fine  Nausea / Vomiting, if any: none, but takes compazine daily to regulate this  Pain issues, if any:  Mid to left abdomen, dull ache 4/10     SAFETY ISSUES:  Prior radiation? NO  Pacemaker/ICD? NO  Is the patient on methotrexate? No  Complaints/details:Married,  3 children,Disabled, former smoker cigars 1 daily x 6 months,quit cigarettes 20 years ago 1pack lasted him 2-3 days, , quit alcohol 30 years ago, no illicit drug use or smokeless tobacco use  Allergies:NKA   BP 137/98 mmHg  Pulse 105  Temp(Src) 98.5 F (36.9 C) (Oral)  Resp 20  Ht 6' (1.829 m)  Wt 149 lb 4.8 oz (67.722 kg)  BMI 20.24 kg/m2  SpO2 100%  Wt Readings from Last 3 Encounters:  09/26/14 149 lb 4.8 oz (67.722 kg)  09/19/14 153 lb 1.6 oz (69.446 kg)  09/12/14 155 lb 6.4 oz (70.489 kg)

## 2014-09-25 ENCOUNTER — Other Ambulatory Visit: Payer: Self-pay | Admitting: Oncology

## 2014-09-26 ENCOUNTER — Other Ambulatory Visit: Payer: Self-pay | Admitting: *Deleted

## 2014-09-26 ENCOUNTER — Ambulatory Visit
Admission: RE | Admit: 2014-09-26 | Discharge: 2014-09-26 | Disposition: A | Discharge status: Home / Self Care | Payer: Medicare Other | Source: Ambulatory Visit | Attending: Radiation Oncology | Admitting: Radiation Oncology

## 2014-09-26 ENCOUNTER — Encounter: Payer: Self-pay | Admitting: Radiation Oncology

## 2014-09-26 ENCOUNTER — Telehealth: Payer: Self-pay | Admitting: *Deleted

## 2014-09-26 VITALS — BP 137/98 | HR 105 | Temp 98.5°F | Resp 20 | Ht 72.0 in | Wt 149.3 lb

## 2014-09-26 DIAGNOSIS — M109 Gout, unspecified: Secondary | ICD-10-CM | POA: Diagnosis not present

## 2014-09-26 DIAGNOSIS — E785 Hyperlipidemia, unspecified: Secondary | ICD-10-CM | POA: Insufficient documentation

## 2014-09-26 DIAGNOSIS — C259 Malignant neoplasm of pancreas, unspecified: Secondary | ICD-10-CM

## 2014-09-26 DIAGNOSIS — F17290 Nicotine dependence, other tobacco product, uncomplicated: Secondary | ICD-10-CM | POA: Insufficient documentation

## 2014-09-26 DIAGNOSIS — K7689 Other specified diseases of liver: Secondary | ICD-10-CM | POA: Diagnosis not present

## 2014-09-26 DIAGNOSIS — K635 Polyp of colon: Secondary | ICD-10-CM | POA: Insufficient documentation

## 2014-09-26 DIAGNOSIS — I82890 Acute embolism and thrombosis of other specified veins: Secondary | ICD-10-CM | POA: Insufficient documentation

## 2014-09-26 DIAGNOSIS — Z510 Encounter for antineoplastic radiation therapy: Secondary | ICD-10-CM | POA: Diagnosis present

## 2014-09-26 DIAGNOSIS — I10 Essential (primary) hypertension: Secondary | ICD-10-CM | POA: Insufficient documentation

## 2014-09-26 DIAGNOSIS — R109 Unspecified abdominal pain: Secondary | ICD-10-CM

## 2014-09-26 HISTORY — DX: Encounter for other specified aftercare: Z51.89

## 2014-09-26 MED ORDER — MORPHINE SULFATE 30 MG PO TABS: 30.0000 mg | ORAL_TABLET | ORAL | Status: DC | PRN

## 2014-09-26 NOTE — Telephone Encounter (Addendum)
Called Dr. Gearldine Shown nurse phone [phone] with Gar Ponto, patient just left our ofice for a consult, and patient requesting MSIR refill from [name], Amy  Asked if patient wanted rx today, answered yes, so will ask patient to wait in lobby 3:58 PM will have patient wait in lobby

## 2014-09-26 NOTE — Progress Notes (Signed)
Radiation Oncology         [phone] ________________________________  Name: [name] MRN: [id number]  Date: 09/26/2014  DOB: 1955/10/06  [id number] Ray Mems, MD  Leeanne Rio, MD     REFERRING PHYSICIAN: Leeanne Rio, MD   DIAGNOSIS: The encounter diagnosis was Malignant neoplasm of pancreas, unspecified location of malignancy.  HISTORY OF PRESENT ILLNESS::Ray [name] is a 59 y.o. male who is seen for an initial consultation visit regarding the patient's diagnosis of pancreatic cancer. Ray [name] was last treated with gemcitabine/Abraxane on 08/22/2014. Beginning on day 3 he developed "burning" diffusely over his skin. Over the past week he has developed dry desquamation of the skin over the trunk and extremities. No erythema. No mouth or eye sores. He now has pain in the subxiphoid region. He stayed in bed most of last week.  The patient has had a history of metastatic pancreatic cancer. He has been treated with multiple rounds of chemotherapy through [name]. His treatment notably also has included a celiac block which has been completed twice. This has significantly helped his pain. Unfortunately recent studies demonstrate progression of disease.  Recent CT of the abdomen/pelvis has revealed disease progression in the pancreas. [name] has suggested palliative radiation treatment based on this new development and in the management of his pain.   Currently denies diarrhea, vomiting, nausea , vision changes or headaches.  Status post celiac plexus block 03/31/2014. Status post a celiac block 08/05/2013.Endoscopic  U/S 07/15/13 showed a very vague like mass like lesion in region of the ucinate pancreas,fine needle aspiration, atypical cells consistent with adenocarcinoma.   Patient states he is claustrophobic.    PREVIOUS RADIATION THERAPY: No   PAST MEDICAL HISTORY:  has a past medical history of Blood transfusion; GERD (gastroesophageal reflux  disease); Hyperlipidemia; Hypertension; H/O hiatal hernia; Arthritis; Colon polyps; Splenic vein thrombosis; Liver cyst; pancreatitis; Gout; Pancreatic cancer (07/06/13); and Blood transfusion without reported diagnosis.     PAST SURGICAL HISTORY: Past Surgical History  Procedure Laterality Date  . Hip surgery      x2 right hip- retained hardware ambulates with cane  . Shoulder surgery      right  . Hand surgery      left thumb tendon repair  . Femur hardware removal  2011    R femur- prothesis removed & replaced  . Posterior cervical fusion/foraminotomy N/A 04/20/2012    Procedure: POSTERIOR CERVICAL FUSION/FORAMINOTOMY LEVEL 1;  Surgeon: Ophelia Charter, MD;  Location: [facility];  Service: Neurosurgery;  Laterality: N/A;  C1/2 laminectomies with posterior screw fixation  . Colonoscopy w/ biopsies  04/15/2011  . Eus N/A 06/10/2013    Procedure: UPPER ENDOSCOPIC ULTRASOUND (EUS) LINEAR;  Surgeon: Milus Banister, MD;  Location: [facility];  Service: Endoscopy;  Laterality: N/A;  . Eus Bilateral 07/15/2013    Procedure: ESOPHAGEAL ENDOSCOPIC ULTRASOUND (EUS) RADIAL;  Surgeon: Milus Banister, MD;  Location: [facility];  Service: Endoscopy;  Laterality: Bilateral;  . Eus N/A 08/05/2013    Procedure: ESOPHAGEAL ENDOSCOPIC ULTRASOUND (EUS) RADIAL;  Surgeon: Milus Banister, MD;  Location: [facility];  Service: Endoscopy;  Laterality: N/A;  . Neurolytic celiac plexus N/A 08/05/2013    Procedure: NEUROLYTIC CELIAC PLEXUS;  Surgeon: Milus Banister, MD;  Location: [facility];  Service: Endoscopy;  Laterality: N/A;  . Eus N/A 03/31/2014    Procedure: UPPER ENDOSCOPIC ULTRASOUND (EUS) LINEAR;  Surgeon: Milus Banister, MD;  Location: [facility];  Service: Endoscopy;  Laterality: N/A;   celiac plexus neurolysis  . Neurolytic celiac plexus N/A 03/31/2014    Procedure: NEUROLYTIC CELIAC PLEXUS;  Surgeon: Milus Banister, MD;  Location: [facility];  Service: Endoscopy;  Laterality: N/A;      FAMILY HISTORY: family history is negative for Colon cancer, Esophageal cancer, Stomach cancer, and Rectal cancer.   SOCIAL HISTORY:  reports that he has been smoking Cigars.  He has never used smokeless tobacco. He reports that he does not drink alcohol or use illicit drugs.   ALLERGIES: Review of patient's allergies indicates no known allergies.   MEDICATIONS:  Current Outpatient Prescriptions  Medication Sig Dispense Refill  . amLODipine (NORVASC) 10 MG tablet Take 1 tablet (10 mg total) by mouth daily. Need PCP appointment. 30 tablet 0  . DULoxetine (CYMBALTA) 30 MG capsule TAKE 1 CAPSULE DAILY (NEED PRIMARY CARE PHYSICIAN APPOINTMENT) 30 capsule 0  . KLOR-CON M20 20 MEQ tablet TAKE 1 TABLET DAILY 90 tablet 1  . lidocaine-prilocaine (EMLA) cream Apply over port area 1-2 hours prior to treatment and cover with plastic wrap.  DO NOT RUB IN. 30 g 3  . morphine (MS CONTIN) 30 MG 12 hr tablet Take 3 tablets (90 mg total) by mouth every 8 (eight) hours. 270 tablet 0  . omeprazole (PRILOSEC) 40 MG capsule TAKE 1 CAPSULE DAILY 90 capsule 3  . Polyethyl Glycol-Propyl Glycol (SYSTANE) 0.4-0.3 % SOLN Apply 1 drop to eye 3 (three) times daily as needed (dry eyes).    . rivaroxaban (XARELTO) 20 MG TABS tablet Take 1 tablet (20 mg total) by mouth every morning. 30 tablet 0  . capecitabine (XELODA) 500 MG tablet Take 3 tablets (1,500 mg total) by mouth 2 (two) times daily after a meal. Take on days of radiation therapy only (Monday-Friday) (Patient not taking: Reported on 09/26/2014) 168 tablet 0  . dexamethasone (DECADRON) 4 MG tablet TAKE 2 TABLETS TWICE DAILY FOR 3 DAYS BEGINNING THE DAY AFTER CHEMO (Patient not taking: Reported on 09/26/2014) 24 tablet 1  . loperamide (IMODIUM) 2 MG capsule Take 1 capsule (2 mg total) by mouth as needed for diarrhea or loose stools. Maximum 8 tabs per day (Patient not taking: Reported on 09/19/2014) 30 capsule 2  . morphine (MSIR) 30 MG tablet Take 1 tablet (30  mg total) by mouth every 4 (four) hours as needed for moderate pain. 75 tablet 0  . ondansetron (ZOFRAN) 8 MG tablet Take 1 tablet (8 mg total) by mouth every 8 (eight) hours as needed for nausea or vomiting. (Patient not taking: Reported on 09/26/2014) 30 tablet 2  . prochlorperazine (COMPAZINE) 10 MG tablet Take 1 tablet (10 mg total) by mouth every 6 (six) hours as needed for nausea or vomiting. (Patient not taking: Reported on 09/26/2014) 60 tablet 2  . sorbitol 70 % SOLN Take 15 ml - 30 ml by mouth 2 (two) times a days as needed for constipation (Patient not taking: Reported on 09/19/2014) 1000 mL 1  . VIAGRA 50 MG tablet TAKE 1 TABLET AS NEEDED (Patient not taking: Reported on 09/19/2014) 18 tablet 1   No current facility-administered medications for this encounter.     REVIEW OF SYSTEMS:  A 15 point review of systems is documented in the electronic medical record. This was obtained by the nursing staff. However, I reviewed this with the patient to discuss relevant findings and make appropriate changes.  Pertinent items are noted in HPI.    PHYSICAL EXAM:  height is 6' (1.829 m)  and weight is 149 lb 4.8 oz (67.722 kg). His oral temperature is 98.5 F (36.9 C). His blood pressure is 137/98 and his pulse is 105. His respiration is 20 and oxygen saturation is 100%.   General: Well-developed, in no acute distress HEENT: Normocephalic, atraumatic; oral cavity clear Neck: Supple without any lymphadenopathy Cardiovascular: Regular rate and rhythm Respiratory: Clear to auscultation bilaterally GI: Soft, nontender, normal bowel sounds Extremities: No edema present Neuro: No focal deficits  ECOG = 2  0 - Asymptomatic (Fully active, able to carry on all predisease activities without restriction)  1 - Symptomatic but completely ambulatory (Restricted in physically strenuous activity but ambulatory and able to carry out work of a light or sedentary nature. For example, light housework, office  work)  2 - Symptomatic, <50% in bed during the day (Ambulatory and capable of all self care but unable to carry out any work activities. Up and about more than 50% of waking hours)  3 - Symptomatic, >50% in bed, but not bedbound (Capable of only limited self-care, confined to bed or chair 50% or more of waking hours)  4 - Bedbound (Completely disabled. Cannot carry on any self-care. Totally confined to bed or chair)  5 - Death   Eustace Pen MM, Creech RH, Tormey DC, et al. [phone]). "Toxicity and response criteria of the [facility]". [place] Oncol. 5 (6): 649-55  _   LABORATORY DATA:  Lab Results  Component Value Date   WBC 5.8 09/19/2014   HGB 11.5* 09/19/2014   HCT 36.2* 09/19/2014   MCV 82.9 09/19/2014   PLT 199 09/19/2014   Lab Results  Component Value Date   NA 138 09/12/2014   K 4.3 09/12/2014   CL 102 04/07/2014   CO2 26 09/12/2014   Lab Results  Component Value Date   ALT 8 09/12/2014   AST 13 09/12/2014   ALKPHOS 84 09/12/2014   BILITOT 0.54 09/12/2014      RADIOGRAPHY: Ct Abdomen Pelvis W Contrast  09/16/2014   CLINICAL DATA:  Restaging of pancreatic cancer. Originally diagnosed 6/15. History gastroesophageal reflux disease. Hypertension. Hyperlipidemia. Pancreatitis.  EXAM: CT ABDOMEN AND PELVIS WITH CONTRAST  TECHNIQUE: Multidetector CT imaging of the abdomen and pelvis was performed using the standard protocol following bolus administration of intravenous contrast.  CONTRAST:  173mL OMNIPAQUE IOHEXOL 300 MG/ML  SOLN  COMPARISON:  04/05/2014  FINDINGS: Lower chest: Clear lung bases. Normal heart size without pericardial or pleural effusion.  Hepatobiliary: 7 mm high left hepatic lobe cyst. A pericholecystic right liver lobe 7 mm cyst on image 32. Normal gallbladder. Stable mild intrahepatic biliary ductal dilatation. The common duct measures 8 mm in the porta hepatis versus 7 mm on the prior. No obstructive stone identified.  Pancreas:  Pancreatic atrophy throughout the body and tail. Similar borderline pancreatic duct dilatation. No dominant pancreatic mass identified. Increase in retropancreatic soft tissue infiltration at the level of the celiac axis and superior mesenteric artery. Its most confluent portion measures on the order of 3.7 x 3.5 cm on image 31 of series 6. At the same level on the prior exam, measured 2.1 x 1.9 cm (when remeasured). Increased soft tissue anterior to the superior mesenteric artery on image 38 of series 6 compared to image 32 of series 2 on the prior exam. Example also on sagittal image 60 of series 607. extension of amorphous abnormal soft tissue into the porta hepatis. Example image 27 of series 6 today, new from image 23  of series 2 on the prior.  No acute pancreatitis identified.  Spleen: Normal  Adrenals/Urinary Tract: Normal adrenal glands. Normal kidneys, without hydronephrosis. Normal urinary bladder.  Stomach/Bowel: Normal stomach, without wall thickening. Colonic stool burden suggests constipation. Normal terminal ileum and appendix. Normal small bowel.  Vascular/Lymphatic: Aorta and branch vessels otherwise unremarkable with atherosclerosis within. 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 No acute thrombus identified. No separate well-defined retroperitoneal adenopathy. No pelvic adenopathy.  Reproductive: Normal prostate.  Other: No significant free fluid. No evidence of omental or peritoneal disease.  Musculoskeletal: Proximal right femoral fixation.  IMPRESSION: 1. Although a well-defined pancreatic primary is not identified, locally advanced disease extending posteriorly is progressive. This surrounds the celiac, superior mesenteric artery, and extends medially toward the porta hepatis. 2. The 9 mm hepatic dome low-density lesion described on the prior exam is not readily apparent. This  is indeterminate but could represent response to therapy of hepatic metastasis. 3. Similar mild intrahepatic and borderline common duct dilatation, without obstructive cause identified. 4.  Possible constipation. 5. 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   Electronically Signed   By: Abigail Miyamoto M.D.   On: 09/16/2014 14:10    IMPRESSION: The patient is a 59 year old man who has been diagnosed with metastatic pancreatic cancer with locally advanced disease. Recent CT scan has revealed disease progression as noted above. At this time, the patient would be a good candidate for palliative radiation treatment to the pancreas.   PLAN: We discussed the possible side effects and risks of treatment in addition to the possible benefits of treatment. We discussed the protocol for radiation treatment.  All of the patient's questions were answered. The patient does wish to proceed with this treatment and has signed a consent form. A simulation will be scheduled such that we can proceed with treatment planning.  I anticipate 5.5 weeks of treatment. This will be given with concurrent chemotherapy.  I spent 45 minutes face to face with the patient and more than 50% of that time was spent in counseling and/or coordination of care.    ________________________________   Jodelle Gross, MD, PhD   **Disclaimer: This note was dictated with voice recognition software. Similar sounding words can inadvertently be transcribed and this note may contain transcription errors which may not have been corrected upon publication of note.**  This document serves as a record of services personally performed by Kyung Rudd, MD. It was created on his behalf by Derek Mound, a trained medical scribe. The creation of this record is based on the scribe's personal observations and the provider's statements to them. This document has been checked and approved by the attending  provider.

## 2014-09-27 ENCOUNTER — Encounter: Payer: Self-pay | Admitting: *Deleted

## 2014-09-27 NOTE — Progress Notes (Signed)
[facility] Psychosocial Distress Screening Clinical Social Work  Clinical Social Work was referred by distress screening protocol.  The patient scored a 6 on the Psychosocial Distress Thermometer which indicates moderate distress. Clinical Social Worker contacted patient at home to assess for distress and other psychosocial needs. Patient stated he was doing "well, considering everything".  CSW informed patient of the support team and support services at [facility].  Patient is a Theme park manager of a church in [place] and stated he had support through his church and fellow pastors in the community.  CSW and patient discussed common feelings and emotions when going through treatment, and the importance of support.  CSW encouraged patient to call with any questions or concerns.      ONCBCN DISTRESS SCREENING 09/26/2014  Screening Type Initial Screening  Distress experienced in past week (1-10) 6  Emotional problem type Depression;Feeling hopeless;Adjusting to appearance changes  Spiritual/Religous concerns type Facing my mortality  Physical Problem type Pain;Sleep/insomnia;Loss of appetitie;Tingling hands/feet;Skin dry/itchy  Physician notified of physical symptoms Yes  Referral to clinical social work Yes  Referral to dietition Yes   Johnnye Lana, MSW, LCSW, OSW-C Clinical Social Worker [place] [phone]

## 2014-09-28 ENCOUNTER — Encounter: Payer: Self-pay | Admitting: *Deleted

## 2014-09-28 NOTE — [facility] Oncology Navigator Note (Signed)
Confirmed with [place] that co pay for Xeloda was 0. Picked up his script on 09/19/14. Knows to start when RT begins.

## 2014-10-03 ENCOUNTER — Ambulatory Visit
Admission: RE | Admit: 2014-10-03 | Discharge: 2014-10-03 | Disposition: A | Discharge status: Home / Self Care | Payer: Medicare Other | Source: Ambulatory Visit | Attending: Radiation Oncology | Admitting: Radiation Oncology

## 2014-10-03 DIAGNOSIS — C250 Malignant neoplasm of head of pancreas: Secondary | ICD-10-CM

## 2014-10-03 DIAGNOSIS — Z510 Encounter for antineoplastic radiation therapy: Secondary | ICD-10-CM | POA: Diagnosis not present

## 2014-10-04 ENCOUNTER — Telehealth: Payer: Self-pay | Admitting: *Deleted

## 2014-10-04 NOTE — Telephone Encounter (Signed)
CALL PT. WHEN PRESCRIPTION IS READY. 

## 2014-10-05 ENCOUNTER — Encounter: Payer: Self-pay | Admitting: Skilled Nursing Facility1

## 2014-10-05 NOTE — Telephone Encounter (Signed)
Returned call to pt, informed him Rx will be available for pick up on 9/2. He voiced understanding, appreciation for return call.

## 2014-10-05 NOTE — Progress Notes (Signed)
Subjective:     Patient ID: Ray [name], male   DOB: 28-Feb-1955, 59 y.o.   MRN: [id number]  HPI   Review of Systems     Objective:   Physical Exam To assist the pt in identifying some dietary strategies to gain some lost wt back.     Assessment:     Pt identified as being malnourished due to losing some wt. Pt was contacted via the telephone at [phone]. Pt was unavailable so a voicemail was left.    Plan:     Dietitian left a message prompting the pt to call Ernestene Kiel CSO,RD,LDN. [phone].

## 2014-10-05 NOTE — Addendum Note (Signed)
Encounter addended by: Doreen Beam, RN on: 10/05/2014  9:09 AM<BR>     Documentation filed: Charges VN

## 2014-10-07 ENCOUNTER — Other Ambulatory Visit: Payer: Self-pay | Admitting: *Deleted

## 2014-10-07 DIAGNOSIS — C259 Malignant neoplasm of pancreas, unspecified: Secondary | ICD-10-CM

## 2014-10-07 DIAGNOSIS — R109 Unspecified abdominal pain: Secondary | ICD-10-CM

## 2014-10-07 MED ORDER — MORPHINE SULFATE ER 30 MG PO TBCR: 90.0000 mg | EXTENDED_RELEASE_TABLET | Freq: Three times a day (TID) | ORAL | Status: DC

## 2014-10-12 ENCOUNTER — Ambulatory Visit (HOSPITAL_BASED_OUTPATIENT_CLINIC_OR_DEPARTMENT_OTHER): Payer: Medicare Other | Admitting: Oncology

## 2014-10-12 ENCOUNTER — Telehealth: Payer: Self-pay | Admitting: Oncology

## 2014-10-12 ENCOUNTER — Other Ambulatory Visit (HOSPITAL_BASED_OUTPATIENT_CLINIC_OR_DEPARTMENT_OTHER): Payer: Medicare Other

## 2014-10-12 VITALS — BP 132/95 | HR 108 | Temp 98.9°F | Resp 17 | Ht 72.0 in | Wt 147.3 lb

## 2014-10-12 DIAGNOSIS — C250 Malignant neoplasm of head of pancreas: Secondary | ICD-10-CM | POA: Diagnosis present

## 2014-10-12 DIAGNOSIS — Z86718 Personal history of other venous thrombosis and embolism: Secondary | ICD-10-CM

## 2014-10-12 DIAGNOSIS — C787 Secondary malignant neoplasm of liver and intrahepatic bile duct: Secondary | ICD-10-CM

## 2014-10-12 DIAGNOSIS — C259 Malignant neoplasm of pancreas, unspecified: Secondary | ICD-10-CM

## 2014-10-12 DIAGNOSIS — R109 Unspecified abdominal pain: Secondary | ICD-10-CM

## 2014-10-12 DIAGNOSIS — C251 Malignant neoplasm of body of pancreas: Secondary | ICD-10-CM

## 2014-10-12 DIAGNOSIS — R234 Changes in skin texture: Secondary | ICD-10-CM | POA: Diagnosis not present

## 2014-10-12 LAB — CBC WITH DIFFERENTIAL/PLATELET
BASO%: 0.2 % (ref 0.0–2.0)
Basophils Absolute: 0 10*3/uL (ref 0.0–0.1)
EOS%: 0.5 % (ref 0.0–7.0)
Eosinophils Absolute: 0 10*3/uL (ref 0.0–0.5)
HEMATOCRIT: 36.1 % — AB (ref 38.4–49.9)
HGB: 12 g/dL — ABNORMAL LOW (ref 13.0–17.1)
LYMPH%: 24.2 % (ref 14.0–49.0)
MCH: 26.4 pg — AB (ref 27.2–33.4)
MCHC: 33.2 g/dL (ref 32.0–36.0)
MCV: 79.3 fL (ref 79.3–98.0)
MONO#: 0.5 10*3/uL (ref 0.1–0.9)
MONO%: 7.8 % (ref 0.0–14.0)
NEUT%: 67.3 % (ref 39.0–75.0)
NEUTROS ABS: 4.2 10*3/uL (ref 1.5–6.5)
PLATELETS: 145 10*3/uL (ref 140–400)
RBC: 4.55 10*6/uL (ref 4.20–5.82)
RDW: 17.4 % — ABNORMAL HIGH (ref 11.0–14.6)
WBC: 6.2 10*3/uL (ref 4.0–10.3)
lymph#: 1.5 10*3/uL (ref 0.9–3.3)
nRBC: 0 % (ref 0–0)

## 2014-10-12 LAB — COMPREHENSIVE METABOLIC PANEL (CC13)
ALT: 10 U/L (ref 0–55)
ANION GAP: 10 meq/L (ref 3–11)
AST: 11 U/L (ref 5–34)
Albumin: 4.4 g/dL (ref 3.5–5.0)
Alkaline Phosphatase: 85 U/L (ref 40–150)
BUN: 9.8 mg/dL (ref 7.0–26.0)
CALCIUM: 9.8 mg/dL (ref 8.4–10.4)
CHLORIDE: 98 meq/L (ref 98–109)
CO2: 27 meq/L (ref 22–29)
Creatinine: 1 mg/dL (ref 0.7–1.3)
EGFR: >90 mL/min/{1.73_m2} (ref >90)
Glucose: 101 mg/dl (ref 70–140)
POTASSIUM: 4.3 meq/L (ref 3.5–5.1)
Sodium: 135 mEq/L — ABNORMAL LOW (ref 136–145)
Total Bilirubin: 0.5 mg/dL (ref 0.20–1.20)
Total Protein: 7.3 g/dL (ref 6.4–8.3)

## 2014-10-12 NOTE — Telephone Encounter (Signed)
gaev and printed appt sched and avs for pt for Sept

## 2014-10-12 NOTE — Progress Notes (Signed)
Ray [name]   Diagnosis: Pancreas cancer  INTERVAL HISTORY:   Ray [name] returns as scheduled. He reports control of the abdominal pain with morphine. He has a poor appetite. He is scheduled to begin radiation tomorrow.  Objective:  Vital signs in last 24 hours:  Blood pressure 132/95, pulse 108, temperature 98.9 F (37.2 C), temperature source Oral, resp. rate 17, height 6' (1.829 m), weight 147 lb 4.8 oz (66.815 kg), SpO2 100 %.    Resp: Lungs clear bilaterally Cardio: Regular rate and rhythm GI: No hepatosplenomegaly, no mass Vascular: No leg edema  Skin: Diffuse dry hyperpigmentation of the skin   Portacath/PICC-without erythema  Lab Results:  Lab Results  Component Value Date   WBC 6.2 10/12/2014   HGB 12.0* 10/12/2014   HCT 36.1* 10/12/2014   MCV 79.3 10/12/2014   PLT 145 10/12/2014   NEUTROABS 4.2 10/12/2014     Medications: I have reviewed the patient's current medications.  Assessment/Plan: 1. Pancreas cancer.  07/06/2013 ,CA 19-9- 2067.   CT abdomen/pelvis 07/09/2013 with interval development of amorphous soft tissue circumferentially encasing the celiac axis and tracking caudally down toward the SMA. Associated stable irregular mild distention of the main pancreatic duct in the body and tail of the pancreas with no discrete mass lesion seen. 2.2 x 1.4 cm portacaval lymph node in the hepatoduodenal ligament had increased in size from 1.3 x 1.1 cm previously. Other scattered small lymph nodes seen in the hepatoduodenal ligament.   Endoscopic ultrasound 07/15/2013 with a very vague masslike lesion in the region of the uncinate pancreas. Fine needle aspiration showed atypical cells consistent with adenocarcinoma.   PET scan 07/29/2013 with hypermetabolic soft tissue posterior to the pancreas with an adjacent hypermetabolic lymph node, no hypermetabolic pancreas mass, no evidence of distant metastatic disease   Cycle 1  FOLFIRINOX 08/02/2013.   Cycle 2 FOLFIRINOX 08/16/2013.   Cycle 3 FOLFIRINOX 09/06/2013.   Cycle 4 FOLFIRINOX 09/21/2013.   CA 19-9 improved on 10/04/2013.   Cycle 5 FOLFIRINOX 10/04/2013.   Cycle 6 FOLFIRINOX 10/18/2013   Restaging CT 10/22/2013 with no change in the soft tissue surrounding the pancreas/celiac axis, scattered indeterminate low-attenuation liver lesions are more conspicuous.  Cycle 7 FOLFIRINOX 11/08/2013  Cycle 8 FOLFIRINOX 11/22/2013  Cycle 9 FOLFIRINOX 12/06/2013  Cycle 10 FOLFIRI (oxaliplatin eliminated) 12/20/2013.  Cycle 11 FOLFIRI 01/03/2014  Restaging CT 01/12/2014-no change in the soft tissue posterior to the pancreas or an 8 mm hepatic metastasis  Cycle 12 FOLFIRI 01/17/2014.  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, soft tissue density encasing the celiac axis, a possible 9 mm hepatic metastasis  Initiation of gemcitabine/Abraxane on a day one/day 8 schedule 04/11/2014  Cycle 2 gemcitabine/Abraxane 05/02/2014, 05/09/2014  CA-19-9 improved 05/30/2014  Cycle 3 gemcitabine/Abraxane 05/30/2014, 06/06/2014  Cycle 4 gemcitabine/Abraxane 06/20/2014, 06/27/2014  Cycle 5 gemcitabine/Abraxane 07/11/2014, 07/18/2014  Cycle 6 gemcitabine/Abraxane 08/01/2014 (day 1 only; patient canceled day 8)  CA-19-9 improved 08/01/2014  Cycle 7 gemcitabine/Abraxane 08/22/2014  CT abdomen/pelvis 09/16/2014 with no dominant pancreatic mass identified. Increase in retroperitoneal pancreatic soft tissue infiltration at the level of the celiac axis and superior mesenteric artery.  Initiation of Xeloda/radiation 10/13/2014 2. Abdominal pain secondary to #1.   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  Status post celiac block 03/31/2014.  Abdominal pain is markedly improved. Decrease MS Contin to 90 mg every 12 hours beginning  08/01/2014 (previously 90 mg every 8 hours). He had increased pain with the dose decrease. He has resumed MS Contin 90 mg every 8 hours. Abdominal pain is well-controlled. 3. Hypertension. 4. History of gout. 5. Nausea and vomiting following FOLFIRINOX chemotherapy.  Aloxi and Emend added with cycle 2.   Decadron added with cycle 3. 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 7. Hospitalization 08/24/2013 through 08/27/2013 with poorly controlled nausea/vomiting. Diarrhea during the hospitalization. 8. History of Skin hyperpigmentation and dryness secondary to 5-fluorouracil 9. Diffuse dry desquamation of the skin following gemcitabine/Abraxane 08/22/2014. Improved 09/19/2014.    Disposition:  Ray [name] appears stable. He will continue MS Contin and MSIR for pain.  He will begin concurrent Xeloda and radiation 10/13/2014. He will return for an office and lab visit in 2 weeks.  Betsy Coder, MD  10/12/2014  12:50 PM

## 2014-10-13 ENCOUNTER — Ambulatory Visit
Admission: RE | Admit: 2014-10-13 | Discharge: 2014-10-13 | Disposition: A | Discharge status: Home / Self Care | Payer: Medicare Other | Source: Ambulatory Visit | Attending: Radiation Oncology | Admitting: Radiation Oncology

## 2014-10-13 ENCOUNTER — Telehealth: Payer: Self-pay | Admitting: *Deleted

## 2014-10-13 ENCOUNTER — Ambulatory Visit (INDEPENDENT_AMBULATORY_CARE_PROVIDER_SITE_OTHER): Payer: Medicare Other | Admitting: Podiatry

## 2014-10-13 ENCOUNTER — Encounter: Payer: Self-pay | Admitting: Podiatry

## 2014-10-13 ENCOUNTER — Ambulatory Visit (HOSPITAL_BASED_OUTPATIENT_CLINIC_OR_DEPARTMENT_OTHER): Payer: Medicare Other

## 2014-10-13 ENCOUNTER — Ambulatory Visit: Admission: RE | Admit: 2014-10-13 | Payer: Medicare Other | Source: Ambulatory Visit | Admitting: Radiation Oncology

## 2014-10-13 DIAGNOSIS — C250 Malignant neoplasm of head of pancreas: Secondary | ICD-10-CM

## 2014-10-13 DIAGNOSIS — Z452 Encounter for adjustment and management of vascular access device: Secondary | ICD-10-CM | POA: Diagnosis present

## 2014-10-13 DIAGNOSIS — M79606 Pain in leg, unspecified: Secondary | ICD-10-CM

## 2014-10-13 DIAGNOSIS — C787 Secondary malignant neoplasm of liver and intrahepatic bile duct: Secondary | ICD-10-CM

## 2014-10-13 DIAGNOSIS — B351 Tinea unguium: Secondary | ICD-10-CM | POA: Diagnosis not present

## 2014-10-13 DIAGNOSIS — Z95828 Presence of other vascular implants and grafts: Secondary | ICD-10-CM

## 2014-10-13 DIAGNOSIS — Z510 Encounter for antineoplastic radiation therapy: Secondary | ICD-10-CM | POA: Diagnosis not present

## 2014-10-13 MED ORDER — RADIAPLEXRX EX GEL
Freq: Once | CUTANEOUS | Status: AC
  Administered 2014-10-13: 13:00:00 via TOPICAL

## 2014-10-13 MED ORDER — LORAZEPAM 1 MG PO TABS
1.0000 mg | ORAL_TABLET | Freq: Once | ORAL | Status: AC
  Administered 2014-10-13: 1 mg via ORAL
  Filled 2014-10-13: qty 1

## 2014-10-13 MED ORDER — SODIUM CHLORIDE 0.9 % IJ SOLN
10.0000 mL | INTRAMUSCULAR | Status: DC | PRN
  Administered 2014-10-13: 10 mL via INTRAVENOUS
  Filled 2014-10-13: qty 10

## 2014-10-13 MED ORDER — HEPARIN SOD (PORK) LOCK FLUSH 100 UNIT/ML IV SOLN
500.0000 [IU] | Freq: Once | INTRAVENOUS | Status: AC
  Administered 2014-10-13: 500 [IU] via INTRAVENOUS
  Filled 2014-10-13: qty 5

## 2014-10-13 NOTE — Patient Instructions (Signed)

## 2014-10-13 NOTE — Telephone Encounter (Signed)
[facility]  Left voice message: RX Lorazepam (ativan) 1mg  oral tablet, take 1 tablet daily 1 hour prior to procedures, dispense 30 tablets, no refills per Dr. Sharlyne Pacas and written order, then called patient home and spoke the wife, can pick up rx tomorrow am, wife thanked MD and this RN, said we would try again on Monday and see how it goes 5:24 PM

## 2014-10-13 NOTE — Progress Notes (Addendum)
Pt education ,pancreas, discussed ways to manage side effects, nausea, vomiting, diarrhea, fatigue, skin irritation,pain, loss appetite,  Bladder changes, may need to have IVF'S, , gave radiation therapy and you book, , my business card, radiaplex gel skin product to use once  Skin becomes irritated, to use daily after rad txs, may need to go to 5-6 smaller meals,  For nausea, for diarrhea imodium ad, prn,no fresh fruit or fresh vegs, , increase protein in diet, drink plenty fluids,   Patient claustrophobic and  Verbal order for ativan 1mg  x 1 now per Dr. Tammi Klippel, ,patient took SL 1 mg oral  ativan, completed teaching, wife and patient gave verbal understanding, teach back given, also will in basket MD Dr. Lisbeth Renshaw patient requesting ativan before each rad txs, he was sorry but couldn't do the treatment without the ativan, will call wife this after noon at [phone]  once I speak with MD and they use [facility] 1:11 PM

## 2014-10-13 NOTE — Telephone Encounter (Signed)
Patient called and stated he still couldn't complete his treatment even after 1 hour of taking 1 mg ativan sl, he left and has been rescheduled for Monday, he asked for something stronger than 1mg  ativan,informed patient , i'm not sure Dr. Lisbeth Renshaw would increase that dosage and patient is taking Morphine as well, will speak with MD this afternoon and will call patient after 5pm 2:14 PM

## 2014-10-13 NOTE — Progress Notes (Signed)
Patient ID: Ray [name], male   DOB: 12/05/1955, 58 y.o.   MRN: [id number] Complaint:  Visit Type: Patient returns to my office for continued preventative foot care services. Complaint: Patient states" my nails have grown long and thick and become painful to walk and wear shoes" Patient has been diagnosed with cancer and has neuropathy. He presents for preventative foot care services. No changes to ROS.  Podiatric Exam: Vascular: dorsalis pedis and posterior tibial pulses are palpable bilateral. Capillary return is immediate. Temperature gradient is WNL. Skin turgor WNL  Sensorium: Normal Semmes Weinstein monofilament test. Normal tactile sensation bilaterally. Nail Exam: Pt has thick disfigured discolored nails with subungual debris noted bilateral entire nail hallux through fifth toenails Ulcer Exam: There is no evidence of ulcer or pre-ulcerative changes or infection. Orthopedic Exam: Muscle tone and strength are WNL. No limitations in general ROM. No crepitus or effusions noted. Foot type and digits show no abnormalities. Bony prominences are unremarkable. Skin: No Porokeratosis. No infection or ulcers  Diagnosis:  Tinea unguium, Pain in right toe, pain in left toes  Treatment & Plan Procedures and Treatment: Consent by patient was obtained for treatment procedures. The patient understood the discussion of treatment and procedures well. All questions were answered thoroughly reviewed. Debridement of mycotic and hypertrophic toenails, 1 through 5 bilateral and clearing of subungual debris. No ulceration, no infection noted.  Return Visit-Office Procedure: Patient instructed to return to the office for a follow up visit 3 months for continued evaluation and treatment. 

## 2014-10-14 ENCOUNTER — Ambulatory Visit: Payer: Medicare Other

## 2014-10-14 ENCOUNTER — Ambulatory Visit: Payer: Medicare Other | Admitting: Radiation Oncology

## 2014-10-14 ENCOUNTER — Ambulatory Visit
Admission: RE | Admit: 2014-10-14 | Discharge: 2014-10-14 | Disposition: A | Discharge status: Home / Self Care | Payer: Medicare Other | Source: Ambulatory Visit | Attending: Radiation Oncology | Admitting: Radiation Oncology

## 2014-10-17 ENCOUNTER — Ambulatory Visit
Admission: RE | Admit: 2014-10-17 | Discharge: 2014-10-17 | Disposition: A | Discharge status: Home / Self Care | Payer: Medicare Other | Source: Ambulatory Visit | Attending: Radiation Oncology | Admitting: Radiation Oncology

## 2014-10-17 ENCOUNTER — Ambulatory Visit: Payer: Medicare Other

## 2014-10-17 ENCOUNTER — Telehealth: Payer: Self-pay | Admitting: *Deleted

## 2014-10-17 NOTE — Telephone Encounter (Signed)
Received call from pt stating "I need earlier appt with [name] to discuss things; I'm just not able to get in the machine (Rad Onc)"  Per Ned Card, NP; returned call to pt instructing him to call Dr. Ida Rogue office to discuss other options d/t unable to get in machine.  Also informed pt that [name] is out of the office all week and his appt 9/23 would be the earliest appt available.  Pt verbalized understanding and transferred call to [place].

## 2014-10-17 NOTE — Telephone Encounter (Signed)
Called patient  Once back from lunch, spoke with him and also I had spoke with the wife earlier this am as Dr. Lisbeth Renshaw is in another office today and will not be back till tomorrow, will discuss with Dr. Lisbeth Renshaw about his not being able to lay still for the radiation treatments,patient is asking about other options,   He just can't do this  Stated, will discuss if any other options can be done after speaking with Dr. Lisbeth Renshaw, will call patient back tomorrow, just rest today, patient thanked this Rn for the return call 12:40 PM

## 2014-10-17 NOTE — Telephone Encounter (Signed)
Pt agreed to come tomorrow to speak with Dr. Lisbeth Renshaw before any more treatmnents, he will be here 15 minutes before 3pm,infomred [name], RT Therapist Lead 1:59 PM

## 2014-10-17 NOTE — Telephone Encounter (Signed)
Wife called aplogizing  stating" patient is in an uproar cannot  Take treatment even taking Ativan 1 hour before rad txs,wants to cancel all week until he can speak with [name]",, will cancel today, Dr. Lisbeth Renshaw is in another office but will inbasket him and transferred call to [place]#4, gave  Phone call to [facility] RT therapist 10:32 AM

## 2014-10-18 ENCOUNTER — Ambulatory Visit: Admission: RE | Admit: 2014-10-18 | Payer: Medicare Other | Source: Ambulatory Visit | Admitting: Radiation Oncology

## 2014-10-18 ENCOUNTER — Ambulatory Visit: Admission: RE | Admit: 2014-10-18 | Payer: Medicare Other | Source: Ambulatory Visit

## 2014-10-18 NOTE — Telephone Encounter (Signed)
What is this about?

## 2014-10-19 ENCOUNTER — Ambulatory Visit: Payer: Medicare Other

## 2014-10-19 DIAGNOSIS — Z510 Encounter for antineoplastic radiation therapy: Secondary | ICD-10-CM | POA: Diagnosis not present

## 2014-10-20 ENCOUNTER — Ambulatory Visit: Payer: Medicare Other

## 2014-10-20 ENCOUNTER — Telehealth: Payer: Self-pay | Admitting: Family Medicine

## 2014-10-20 NOTE — Telephone Encounter (Signed)
Pt called and would like a refill on his Cymbalta sent to [facility]. He is completely out. Blima Rich

## 2014-10-21 ENCOUNTER — Ambulatory Visit: Payer: Medicare Other

## 2014-10-21 MED ORDER — DULOXETINE HCL 30 MG PO CPEP: ORAL_CAPSULE | ORAL | Status: DC

## 2014-10-21 NOTE — Telephone Encounter (Signed)
Called patient to discuss since it's been a while since I've seen him. He is taking cymbalta daily. Reports good control of mood. Denies SI/HI. Advised I will send in rx for another 2 months but that he should schedule visit with me. Patient agreeable. Leeanne Rio, MD

## 2014-10-24 ENCOUNTER — Ambulatory Visit: Payer: Medicare Other

## 2014-10-24 ENCOUNTER — Ambulatory Visit
Admission: RE | Admit: 2014-10-24 | Discharge: 2014-10-24 | Disposition: A | Discharge status: Home / Self Care | Payer: Medicare Other | Source: Ambulatory Visit | Attending: Radiation Oncology | Admitting: Radiation Oncology

## 2014-10-24 DIAGNOSIS — Z510 Encounter for antineoplastic radiation therapy: Secondary | ICD-10-CM | POA: Diagnosis not present

## 2014-10-25 ENCOUNTER — Ambulatory Visit: Payer: Medicare Other

## 2014-10-25 ENCOUNTER — Ambulatory Visit
Admission: RE | Admit: 2014-10-25 | Discharge: 2014-10-25 | Disposition: A | Discharge status: Home / Self Care | Payer: Medicare Other | Source: Ambulatory Visit | Attending: Radiation Oncology | Admitting: Radiation Oncology

## 2014-10-25 DIAGNOSIS — Z510 Encounter for antineoplastic radiation therapy: Secondary | ICD-10-CM | POA: Diagnosis not present

## 2014-10-26 ENCOUNTER — Ambulatory Visit: Payer: Medicare Other

## 2014-10-26 ENCOUNTER — Ambulatory Visit
Admission: RE | Admit: 2014-10-26 | Discharge: 2014-10-26 | Disposition: A | Discharge status: Home / Self Care | Payer: Medicare Other | Source: Ambulatory Visit | Attending: Radiation Oncology | Admitting: Radiation Oncology

## 2014-10-26 DIAGNOSIS — Z510 Encounter for antineoplastic radiation therapy: Secondary | ICD-10-CM | POA: Diagnosis not present

## 2014-10-27 ENCOUNTER — Ambulatory Visit
Admission: RE | Admit: 2014-10-27 | Discharge: 2014-10-27 | Disposition: A | Discharge status: Home / Self Care | Payer: Medicare Other | Source: Ambulatory Visit | Attending: Radiation Oncology | Admitting: Radiation Oncology

## 2014-10-27 ENCOUNTER — Ambulatory Visit: Payer: Medicare Other

## 2014-10-27 DIAGNOSIS — Z510 Encounter for antineoplastic radiation therapy: Secondary | ICD-10-CM | POA: Diagnosis not present

## 2014-10-28 ENCOUNTER — Telehealth: Payer: Self-pay | Admitting: Oncology

## 2014-10-28 ENCOUNTER — Encounter: Payer: Self-pay | Admitting: Radiation Oncology

## 2014-10-28 ENCOUNTER — Ambulatory Visit
Admission: RE | Admit: 2014-10-28 | Discharge: 2014-10-28 | Disposition: A | Discharge status: Home / Self Care | Payer: Medicare Other | Source: Ambulatory Visit | Attending: Radiation Oncology | Admitting: Radiation Oncology

## 2014-10-28 ENCOUNTER — Other Ambulatory Visit (HOSPITAL_BASED_OUTPATIENT_CLINIC_OR_DEPARTMENT_OTHER): Payer: Medicare Other

## 2014-10-28 ENCOUNTER — Encounter: Payer: Self-pay | Admitting: Nutrition

## 2014-10-28 ENCOUNTER — Ambulatory Visit (HOSPITAL_BASED_OUTPATIENT_CLINIC_OR_DEPARTMENT_OTHER): Payer: Medicare Other | Admitting: Nurse Practitioner

## 2014-10-28 ENCOUNTER — Ambulatory Visit: Payer: Medicare Other

## 2014-10-28 VITALS — BP 124/86 | HR 95 | Temp 98.5°F | Resp 17 | Ht 72.0 in | Wt 141.9 lb

## 2014-10-28 VITALS — BP 116/86 | HR 99 | Temp 98.2°F | Resp 20 | Wt 140.9 lb

## 2014-10-28 DIAGNOSIS — C250 Malignant neoplasm of head of pancreas: Secondary | ICD-10-CM | POA: Diagnosis present

## 2014-10-28 DIAGNOSIS — R109 Unspecified abdominal pain: Secondary | ICD-10-CM

## 2014-10-28 DIAGNOSIS — R630 Anorexia: Secondary | ICD-10-CM | POA: Diagnosis not present

## 2014-10-28 DIAGNOSIS — C259 Malignant neoplasm of pancreas, unspecified: Secondary | ICD-10-CM | POA: Diagnosis present

## 2014-10-28 DIAGNOSIS — Z510 Encounter for antineoplastic radiation therapy: Secondary | ICD-10-CM | POA: Diagnosis not present

## 2014-10-28 LAB — CBC WITH DIFFERENTIAL/PLATELET
BASO%: 0.6 % (ref 0.0–2.0)
BASOS ABS: 0 10*3/uL (ref 0.0–0.1)
EOS ABS: 0 10*3/uL (ref 0.0–0.5)
EOS%: 0.3 % (ref 0.0–7.0)
HCT: 37.7 % — ABNORMAL LOW (ref 38.4–49.9)
HEMOGLOBIN: 12.1 g/dL — AB (ref 13.0–17.1)
LYMPH%: 13.4 % — ABNORMAL LOW (ref 14.0–49.0)
MCH: 26 pg — AB (ref 27.2–33.4)
MCHC: 32 g/dL (ref 32.0–36.0)
MCV: 81.2 fL (ref 79.3–98.0)
MONO#: 0.3 10*3/uL (ref 0.1–0.9)
MONO%: 5.1 % (ref 0.0–14.0)
NEUT#: 5.4 10*3/uL (ref 1.5–6.5)
NEUT%: 80.6 % — ABNORMAL HIGH (ref 39.0–75.0)
Platelets: 164 10*3/uL (ref 140–400)
RBC: 4.64 10*6/uL (ref 4.20–5.82)
RDW: 17.1 % — AB (ref 11.0–14.6)
WBC: 6.6 10*3/uL (ref 4.0–10.3)
lymph#: 0.9 10*3/uL (ref 0.9–3.3)

## 2014-10-28 MED ORDER — MEGESTROL ACETATE 40 MG/ML PO SUSP: 200.0000 mg | Freq: Two times a day (BID) | ORAL | Status: DC

## 2014-10-28 MED ORDER — MORPHINE SULFATE 30 MG PO TABS: 30.0000 mg | ORAL_TABLET | ORAL | Status: DC | PRN

## 2014-10-28 NOTE — Telephone Encounter (Signed)
Gave and printed appt sched and avs for pt for sept and OCT

## 2014-10-28 NOTE — Progress Notes (Addendum)
Ray [name]   Diagnosis:  Pancreas cancer  INTERVAL HISTORY:   Ray [name] returns as scheduled. He continues radiation/Xeloda. He denies nausea/vomiting. No mouth sores. No diarrhea. No hand or foot pain or redness. His pain is "tolerable". He estimates taking 4 MSIR tablets a day. Appetite is poor. He is losing weight.  Objective:  Vital signs in last 24 hours:  Blood pressure 124/86, pulse 95, temperature 98.5 F (36.9 C), temperature source Oral, resp. rate 17, height 6' (1.829 m), weight 141 lb 14.4 oz (64.365 kg), SpO2 100 %.    HEENT: No thrush or ulcers. Resp: Lungs clear bilaterally. Cardio: Regular rate and rhythm. GI: Abdomen soft. No hepatomegaly. Fullness throughout the left upper abdomen. Vascular: No leg edema. Skin: Diffusely dry. Palms with mild thickening. No erythema or skin breakdown.    Lab Results:  Lab Results  Component Value Date   WBC 6.6 10/28/2014   HGB 12.1* 10/28/2014   HCT 37.7* 10/28/2014   MCV 81.2 10/28/2014   PLT 164 10/28/2014   NEUTROABS 5.4 10/28/2014    Imaging:  No results found.  Medications: I have reviewed the patient's current medications.  Assessment/Plan: 1. Pancreas cancer.  07/06/2013 ,CA 19-9- 2067.   CT abdomen/pelvis 07/09/2013 with interval development of amorphous soft tissue circumferentially encasing the celiac axis and tracking caudally down toward the SMA. Associated stable irregular mild distention of the main pancreatic duct in the body and tail of the pancreas with no discrete mass lesion seen. 2.2 x 1.4 cm portacaval lymph node in the hepatoduodenal ligament had increased in size from 1.3 x 1.1 cm previously. Other scattered small lymph nodes seen in the hepatoduodenal ligament.   Endoscopic ultrasound 07/15/2013 with a very vague masslike lesion in the region of the uncinate pancreas. Fine needle aspiration showed atypical cells consistent with adenocarcinoma.   PET  scan 07/29/2013 with hypermetabolic soft tissue posterior to the pancreas with an adjacent hypermetabolic lymph node, no hypermetabolic pancreas mass, no evidence of distant metastatic disease   Cycle 1 FOLFIRINOX 08/02/2013.   Cycle 2 FOLFIRINOX 08/16/2013.   Cycle 3 FOLFIRINOX 09/06/2013.   Cycle 4 FOLFIRINOX 09/21/2013.   CA 19-9 improved on 10/04/2013.   Cycle 5 FOLFIRINOX 10/04/2013.   Cycle 6 FOLFIRINOX 10/18/2013   Restaging CT 10/22/2013 with no change in the soft tissue surrounding the pancreas/celiac axis, scattered indeterminate low-attenuation liver lesions are more conspicuous.  Cycle 7 FOLFIRINOX 11/08/2013  Cycle 8 FOLFIRINOX 11/22/2013  Cycle 9 FOLFIRINOX 12/06/2013  Cycle 10 FOLFIRI (oxaliplatin eliminated) 12/20/2013.  Cycle 11 FOLFIRI 01/03/2014  Restaging CT 01/12/2014-no change in the soft tissue posterior to the pancreas or an 8 mm hepatic metastasis  Cycle 12 FOLFIRI 01/17/2014.  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, soft tissue density encasing the celiac axis, a possible 9 mm hepatic metastasis  Initiation of gemcitabine/Abraxane on a day one/day 8 schedule 04/11/2014  Cycle 2 gemcitabine/Abraxane 05/02/2014, 05/09/2014  CA-19-9 improved 05/30/2014  Cycle 3 gemcitabine/Abraxane 05/30/2014, 06/06/2014  Cycle 4 gemcitabine/Abraxane 06/20/2014, 06/27/2014  Cycle 5 gemcitabine/Abraxane 07/11/2014, 07/18/2014  Cycle 6 gemcitabine/Abraxane 08/01/2014 (day 1 only; patient canceled day 8)  CA-19-9 improved 08/01/2014  Cycle 7 gemcitabine/Abraxane 08/22/2014  CT abdomen/pelvis 09/16/2014 with no dominant pancreatic mass identified. Increase in retroperitoneal pancreatic soft tissue infiltration at the level of the celiac axis and superior mesenteric artery.  Initiation of Xeloda/radiation  10/13/2014  2. Abdominal pain secondary to #1.   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  Status post celiac block 03/31/2014.  Abdominal pain is markedly improved. Decrease MS Contin to 90 mg every 12 hours beginning 08/01/2014 (previously 90 mg every 8 hours). He had increased pain with the dose decrease. He has resumed MS Contin 90 mg every 8 hours. Abdominal pain is well-controlled. 3. Hypertension. 4. History of gout. 5. Nausea and vomiting following FOLFIRINOX chemotherapy.  Aloxi and Emend added with cycle 2.   Decadron added with cycle 3. 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 7. Hospitalization 08/24/2013 through 08/27/2013 with poorly controlled nausea/vomiting. Diarrhea during the hospitalization. 8. History of Skin hyperpigmentation and dryness secondary to 5-fluorouracil 9. Diffuse dry desquamation of the skin following gemcitabine/Abraxane 08/22/2014. Improved 09/19/2014.   Disposition: Ray [name] continues palliative radiation/Xeloda. He has developed anorexia with weight loss. He will begin Megace 200 mg twice daily. We discussed the potential for blood clots with Megace. He is maintained on Xarelto. He understands to call the office with any signs of thrombosis.  He will return for a follow-up visit in approximately 2 weeks.  Patient seen with [name].    Ned Card ANP/GNP-BC   10/28/2014  2:23 PM This was a shared visit with Ned Card. Ray [name] will continue palliative radiation/Xeloda. He appears to be tolerating the Xeloda well. He will begin Megace as an appetite stimulant.  Julieanne Manson, M.D.

## 2014-10-28 NOTE — Progress Notes (Addendum)
Weekly rad tx 5/28 completed abdomen, no nause took compazine and ativan before treatment has helped a lot, encuraged to eat 5-6 smaller meals and ensure between, no skin changes, mild abdomen pain,  Bowels moving regular, is fatigued, poor appetite, drinks 2 ensures a day And drinks gatorade BP 110/85 mmHg  Pulse 91  Temp(Src) 98.2 F (36.8 C) (Oral)  Resp 20  Wt 140 lb 14.4 oz (63.912 kg) sitting   BP 116/86 mmHg  Pulse 99  Temp(Src) 98.2 F (36.8 C) (Oral)  Resp 20  Wt 140 lb 14.4 oz (63.912 kg) standing Wt Readings from Last 3 Encounters:  10/28/14 140 lb 14.4 oz (63.912 kg)  10/12/14 147 lb 4.8 oz (66.815 kg)  09/26/14 149 lb 4.8 oz (67.722 kg)

## 2014-10-28 NOTE — Progress Notes (Signed)
Provided chocolate oral nutrition supplements to patient's wife for patient. Gave information on increasing calories and protein and recipes.

## 2014-10-28 NOTE — Progress Notes (Signed)
Department of Radiation Oncology  Phone:  [phone] Fax:        [phone]  Weekly Treatment Note    Name: Ray [name] Date: 10/28/2014 MRN: [id number] DOB: December 21, 1955   Current dose: 9 Gy  Current fraction:5   MEDICATIONS: Current Outpatient Prescriptions  Medication Sig Dispense Refill  . amLODipine (NORVASC) 10 MG tablet Take 1 tablet (10 mg total) by mouth daily. Need PCP appointment. 30 tablet 0  . capecitabine (XELODA) 500 MG tablet Take 3 tablets (1,500 mg total) by mouth 2 (two) times daily after a meal. Take on days of radiation therapy only (Monday-Friday) 168 tablet 0  . DULoxetine (CYMBALTA) 30 MG capsule TAKE 1 CAPSULE DAILY 30 capsule 1  . hyaluronate sodium (RADIAPLEXRX) GEL Apply 1 application topically daily.    Marland Kitchen KLOR-CON M20 20 MEQ tablet TAKE 1 TABLET DAILY 90 tablet 1  . lidocaine-prilocaine (EMLA) cream Apply over port area 1-2 hours prior to treatment and cover with plastic wrap.  DO NOT RUB IN. (Patient not taking: Reported on 10/12/2014) 30 g 3  . loperamide (IMODIUM) 2 MG capsule Take 1 capsule (2 mg total) by mouth as needed for diarrhea or loose stools. Maximum 8 tabs per day 30 capsule 2  . megestrol (MEGACE ORAL) 40 MG/ML suspension Take 5 mLs (200 mg total) by mouth 2 (two) times daily. 240 mL 1  . morphine (MS CONTIN) 30 MG 12 hr tablet Take 3 tablets (90 mg total) by mouth every 8 (eight) hours. 270 tablet 0  . morphine (MSIR) 30 MG tablet Take 1 tablet (30 mg total) by mouth every 4 (four) hours as needed for moderate pain. 75 tablet 0  . omeprazole (PRILOSEC) 40 MG capsule TAKE 1 CAPSULE DAILY 90 capsule 3  . ondansetron (ZOFRAN) 8 MG tablet Take 1 tablet (8 mg total) by mouth every 8 (eight) hours as needed for nausea or vomiting. (Patient not taking: Reported on 09/26/2014) 30 tablet 2  . Polyethyl Glycol-Propyl Glycol (SYSTANE) 0.4-0.3 % SOLN Apply 1 drop to eye 3 (three) times daily as needed (dry eyes).    . prochlorperazine  (COMPAZINE) 10 MG tablet Take 1 tablet (10 mg total) by mouth every 6 (six) hours as needed for nausea or vomiting. 60 tablet 2  . rivaroxaban (XARELTO) 20 MG TABS tablet Take 1 tablet (20 mg total) by mouth every morning. 30 tablet 0  . sorbitol 70 % SOLN Take 15 ml - 30 ml by mouth 2 (two) times a days as needed for constipation 1000 mL 1  . VIAGRA 50 MG tablet TAKE 1 TABLET AS NEEDED 18 tablet 1   No current facility-administered medications for this encounter.     ALLERGIES: Review of patient's allergies indicates no known allergies.   LABORATORY DATA:  Lab Results  Component Value Date   WBC 6.6 10/28/2014   HGB 12.1* 10/28/2014   HCT 37.7* 10/28/2014   MCV 81.2 10/28/2014   PLT 164 10/28/2014   Lab Results  Component Value Date   NA 135* 10/12/2014   K 4.3 10/12/2014   CL 102 04/07/2014   CO2 27 10/12/2014   Lab Results  Component Value Date   ALT 10 10/12/2014   AST 11 10/12/2014   ALKPHOS 85 10/12/2014   BILITOT 0.50 10/12/2014     NARRATIVE: Ray [name] was seen today for weekly treatment management. The chart was checked and the patient's films were reviewed.  Weekly rad tx 5/28 completed abdomen, no nause took  compazine and ativan before treatment has helped a lot, encuraged to eat 5-6 smaller meals and ensure between, no skin changes, mild abdomen pain,  Bowels moving regular, is fatigued, poor appetite, drinks 2 ensures a day And drinks gatorade BP 116/86 mmHg  Pulse 99  Temp(Src) 98.2 F (36.8 C) (Oral)  Resp 20  Wt 140 lb 14.4 oz (63.912 kg) sitting   BP 116/86 mmHg  Pulse 99  Temp(Src) 98.2 F (36.8 C) (Oral)  Resp 20  Wt 140 lb 14.4 oz (63.912 kg) standing Wt Readings from Last 3 Encounters:  10/28/14 141 lb 14.4 oz (64.365 kg)  10/28/14 140 lb 14.4 oz (63.912 kg)  10/12/14 147 lb 4.8 oz (66.815 kg)    PHYSICAL EXAMINATION: weight is 140 lb 14.4 oz (63.912 kg). His oral temperature is 98.2 F (36.8 C). His blood pressure is 116/86 and  his pulse is 99. His respiration is 20.        ASSESSMENT: The patient is doing satisfactorily with treatment.  PLAN: We will continue with the patient's radiation treatment as planned. The patient will increase his use of ensure to approximately 4 cans per day and we will continue to monitor his weight

## 2014-10-31 ENCOUNTER — Ambulatory Visit
Admission: RE | Admit: 2014-10-31 | Discharge: 2014-10-31 | Disposition: A | Discharge status: Home / Self Care | Payer: Medicare Other | Source: Ambulatory Visit | Attending: Radiation Oncology | Admitting: Radiation Oncology

## 2014-10-31 ENCOUNTER — Ambulatory Visit: Payer: Medicare Other

## 2014-10-31 DIAGNOSIS — Z510 Encounter for antineoplastic radiation therapy: Secondary | ICD-10-CM | POA: Diagnosis not present

## 2014-11-01 ENCOUNTER — Ambulatory Visit
Admission: RE | Admit: 2014-11-01 | Discharge: 2014-11-01 | Disposition: A | Discharge status: Home / Self Care | Payer: Medicare Other | Source: Ambulatory Visit | Attending: Radiation Oncology | Admitting: Radiation Oncology

## 2014-11-01 ENCOUNTER — Ambulatory Visit: Payer: Medicare Other

## 2014-11-01 DIAGNOSIS — Z510 Encounter for antineoplastic radiation therapy: Secondary | ICD-10-CM | POA: Diagnosis not present

## 2014-11-02 ENCOUNTER — Ambulatory Visit: Payer: Medicare Other

## 2014-11-02 ENCOUNTER — Ambulatory Visit
Admission: RE | Admit: 2014-11-02 | Discharge: 2014-11-02 | Disposition: A | Discharge status: Home / Self Care | Payer: Medicare Other | Source: Ambulatory Visit | Attending: Radiation Oncology | Admitting: Radiation Oncology

## 2014-11-02 DIAGNOSIS — Z510 Encounter for antineoplastic radiation therapy: Secondary | ICD-10-CM | POA: Diagnosis not present

## 2014-11-03 ENCOUNTER — Ambulatory Visit
Admission: RE | Admit: 2014-11-03 | Discharge: 2014-11-03 | Disposition: A | Discharge status: Home / Self Care | Payer: Medicare Other | Source: Ambulatory Visit | Attending: Radiation Oncology | Admitting: Radiation Oncology

## 2014-11-03 ENCOUNTER — Ambulatory Visit: Payer: Medicare Other

## 2014-11-03 DIAGNOSIS — Z510 Encounter for antineoplastic radiation therapy: Secondary | ICD-10-CM | POA: Diagnosis not present

## 2014-11-04 ENCOUNTER — Ambulatory Visit
Admission: RE | Admit: 2014-11-04 | Discharge: 2014-11-04 | Disposition: A | Discharge status: Home / Self Care | Payer: Medicare Other | Source: Ambulatory Visit | Attending: Radiation Oncology | Admitting: Radiation Oncology

## 2014-11-04 ENCOUNTER — Ambulatory Visit: Payer: Medicare Other

## 2014-11-04 ENCOUNTER — Encounter: Payer: Self-pay | Admitting: Radiation Oncology

## 2014-11-04 VITALS — BP 111/84 | HR 85 | Temp 98.3°F | Resp 20 | Wt 139.6 lb

## 2014-11-04 DIAGNOSIS — C250 Malignant neoplasm of head of pancreas: Secondary | ICD-10-CM

## 2014-11-04 DIAGNOSIS — Z510 Encounter for antineoplastic radiation therapy: Secondary | ICD-10-CM | POA: Diagnosis not present

## 2014-11-04 NOTE — Progress Notes (Signed)
Department of Radiation Oncology  Phone:  [phone] Fax:        [phone]  Weekly Treatment Note    Name: Ray [name] Date: 11/04/2014 MRN: [id number] DOB: January 28, 1956   Current dose: 18 Gy  Current fraction:10   MEDICATIONS: Current Outpatient Prescriptions  Medication Sig Dispense Refill  . amLODipine (NORVASC) 10 MG tablet Take 1 tablet (10 mg total) by mouth daily. Need PCP appointment. 30 tablet 0  . capecitabine (XELODA) 500 MG tablet Take 3 tablets (1,500 mg total) by mouth 2 (two) times daily after a meal. Take on days of radiation therapy only (Monday-Friday) 168 tablet 0  . DULoxetine (CYMBALTA) 30 MG capsule TAKE 1 CAPSULE DAILY 30 capsule 1  . hyaluronate sodium (RADIAPLEXRX) GEL Apply 1 application topically daily.    Marland Kitchen KLOR-CON M20 20 MEQ tablet TAKE 1 TABLET DAILY 90 tablet 1  . lidocaine-prilocaine (EMLA) cream Apply over port area 1-2 hours prior to treatment and cover with plastic wrap.  DO NOT RUB IN. 30 g 3  . LORazepam (ATIVAN) 1 MG tablet Take 1 mg by mouth as needed.  0  . megestrol (MEGACE ORAL) 40 MG/ML suspension Take 5 mLs (200 mg total) by mouth 2 (two) times daily. 240 mL 1  . morphine (MS CONTIN) 30 MG 12 hr tablet Take 3 tablets (90 mg total) by mouth every 8 (eight) hours. 270 tablet 0  . morphine (MSIR) 30 MG tablet Take 1 tablet (30 mg total) by mouth every 4 (four) hours as needed for moderate pain. 75 tablet 0  . omeprazole (PRILOSEC) 40 MG capsule TAKE 1 CAPSULE DAILY 90 capsule 3  . Polyethyl Glycol-Propyl Glycol (SYSTANE) 0.4-0.3 % SOLN Apply 1 drop to eye 3 (three) times daily as needed (dry eyes).    . prochlorperazine (COMPAZINE) 10 MG tablet Take 1 tablet (10 mg total) by mouth every 6 (six) hours as needed for nausea or vomiting. 60 tablet 2  . rivaroxaban (XARELTO) 20 MG TABS tablet Take 1 tablet (20 mg total) by mouth every morning. 30 tablet 0  . loperamide (IMODIUM) 2 MG capsule Take 1 capsule (2 mg total) by mouth as  needed for diarrhea or loose stools. Maximum 8 tabs per day (Patient not taking: Reported on 11/04/2014) 30 capsule 2  . ondansetron (ZOFRAN) 8 MG tablet Take 1 tablet (8 mg total) by mouth every 8 (eight) hours as needed for nausea or vomiting. (Patient not taking: Reported on 11/04/2014) 30 tablet 2  . sorbitol 70 % SOLN Take 15 ml - 30 ml by mouth 2 (two) times a days as needed for constipation (Patient not taking: Reported on 11/04/2014) 1000 mL 1  . VIAGRA 50 MG tablet TAKE 1 TABLET AS NEEDED (Patient not taking: Reported on 11/04/2014) 18 tablet 1   No current facility-administered medications for this encounter.     ALLERGIES: Review of patient's allergies indicates no known allergies.   LABORATORY DATA:  Lab Results  Component Value Date   WBC 6.6 10/28/2014   HGB 12.1* 10/28/2014   HCT 37.7* 10/28/2014   MCV 81.2 10/28/2014   PLT 164 10/28/2014   Lab Results  Component Value Date   NA 135* 10/12/2014   K 4.3 10/12/2014   CL 102 04/07/2014   CO2 27 10/12/2014   Lab Results  Component Value Date   ALT 10 10/12/2014   AST 11 10/12/2014   ALKPHOS 85 10/12/2014   BILITOT 0.50 10/12/2014     NARRATIVE: Ray Crigler  [name] was seen today for weekly treatment management. The chart was checked and the patient's films were reviewed.  Weekly rad tx pancreas 10/28 competed, drinks 1 ensure daily now down form 2-3 daily, no nausea,  Some discomfort in abdomen, appetite wanes ,no skin chqnges, has radiaplex on hand BP 111/84 mmHg  Pulse 85  Temp(Src) 98.3 F (36.8 C) (Oral)  Resp 20  Wt 139 lb 9.6 oz (63.322 kg)  Wt Readings from Last 3 Encounters:  11/04/14 139 lb 9.6 oz (63.322 kg)  10/28/14 141 lb 14.4 oz (64.365 kg)  10/28/14 140 lb 14.4 oz (63.912 kg)   3:05 PM  PHYSICAL EXAMINATION: weight is 139 lb 9.6 oz (63.322 kg). His oral temperature is 98.3 F (36.8 C). His blood pressure is 111/84 and his pulse is 85. His respiration is 20.        ASSESSMENT: The patient is  doing satisfactorily with treatment.  PLAN: We will continue with the patient's radiation treatment as planned.

## 2014-11-04 NOTE — Progress Notes (Signed)
  Radiation Oncology         ([phone]) [phone] ________________________________  Name: Ray [name] MRN: [id number]  Date: 10/03/2014  DOB: 18-Aug-1955  SIMULATION AND TREATMENT PLANNING NOTE  DIAGNOSIS:  Pancreatic cancer  Site:  abdomen  NARRATIVE:  The patient was brought to the [facility].  Identity was confirmed.  All relevant records and images related to the planned course of therapy were reviewed.   Written consent to proceed with treatment was confirmed which was freely given after reviewing the details related to the planned course of therapy had been reviewed with the patient.  Then, the patient was set-up in a stable reproducible  supine position for radiation therapy.  CT images were obtained.  Surface markings were placed.    Medically necessary complex treatment device(s) for immobilization:  Customized vac lock bag.   The CT images were loaded into the planning software.  Then the target and avoidance structures were contoured.  Treatment planning then occurred.  The radiation prescription was entered and confirmed.  A total of 3 complex treatment devices were fabricated which relate to the designed radiation treatment fields. Each of these customized fields/ complex treatment devices will be used on a daily basis during the radiation course. I have requested : 3D Simulation  I have requested a DVH of the following structures: Target volume, spinal cord left kidney, right kidney.  The patient will undergo daily image guidance to ensure accurate localization of the target, and adequate minimize dose to the normal surrounding structures in close proximity to the target.   PLAN:  The patient will receive 45 Gy in 25 fractions Initially. It is anticipated that the patient will then receive a 5.4 gray boost to yield a final total dose of 50.4 gray.    Special treatment procedure The patient will also receive concurrent chemotherapy during the treatment. The patient  may therefore experience increased toxicity or side effects and the patient will be monitored for such problems. This may require extra lab work as necessary. This therefore constitutes a special treatment procedure.  ________________________________   Jodelle Gross, MD, PhD

## 2014-11-04 NOTE — Addendum Note (Signed)
Encounter addended by: Kyung Rudd, MD on: 11/04/2014  4:05 PM<BR>     Documentation filed: Notes Section

## 2014-11-04 NOTE — Progress Notes (Signed)
Weekly rad tx pancreas 10/28 competed, drinks 1 ensure daily now down form 2-3 daily, no nausea,  Some discomfort in abdomen, appetite wanes ,no skin chqnges, has radiaplex on hand BP 111/84 mmHg  Pulse 85  Temp(Src) 98.3 F (36.8 C) (Oral)  Resp 20  Wt 139 lb 9.6 oz (63.322 kg)  Wt Readings from Last 3 Encounters:  11/04/14 139 lb 9.6 oz (63.322 kg)  10/28/14 141 lb 14.4 oz (64.365 kg)  10/28/14 140 lb 14.4 oz (63.912 kg)   2:37 PM ,

## 2014-11-04 NOTE — Addendum Note (Signed)
Encounter addended by: Kyung Rudd, MD on: 11/04/2014 11:25 AM<BR>     Documentation filed: Visit Diagnoses

## 2014-11-07 ENCOUNTER — Ambulatory Visit
Admission: RE | Admit: 2014-11-07 | Discharge: 2014-11-07 | Disposition: A | Discharge status: Home / Self Care | Payer: Medicare Other | Source: Ambulatory Visit | Attending: Radiation Oncology | Admitting: Radiation Oncology

## 2014-11-07 ENCOUNTER — Ambulatory Visit: Payer: Medicare Other

## 2014-11-07 ENCOUNTER — Other Ambulatory Visit: Payer: Self-pay | Admitting: *Deleted

## 2014-11-07 DIAGNOSIS — R109 Unspecified abdominal pain: Secondary | ICD-10-CM

## 2014-11-07 DIAGNOSIS — K635 Polyp of colon: Secondary | ICD-10-CM | POA: Diagnosis not present

## 2014-11-07 DIAGNOSIS — K7689 Other specified diseases of liver: Secondary | ICD-10-CM | POA: Diagnosis not present

## 2014-11-07 DIAGNOSIS — C259 Malignant neoplasm of pancreas, unspecified: Secondary | ICD-10-CM | POA: Diagnosis present

## 2014-11-07 DIAGNOSIS — I82890 Acute embolism and thrombosis of other specified veins: Secondary | ICD-10-CM | POA: Diagnosis not present

## 2014-11-07 DIAGNOSIS — M109 Gout, unspecified: Secondary | ICD-10-CM | POA: Diagnosis not present

## 2014-11-07 DIAGNOSIS — F17290 Nicotine dependence, other tobacco product, uncomplicated: Secondary | ICD-10-CM | POA: Diagnosis not present

## 2014-11-07 DIAGNOSIS — Z510 Encounter for antineoplastic radiation therapy: Secondary | ICD-10-CM | POA: Diagnosis present

## 2014-11-07 DIAGNOSIS — E785 Hyperlipidemia, unspecified: Secondary | ICD-10-CM | POA: Diagnosis not present

## 2014-11-07 DIAGNOSIS — I10 Essential (primary) hypertension: Secondary | ICD-10-CM | POA: Diagnosis not present

## 2014-11-07 MED ORDER — MORPHINE SULFATE ER 30 MG PO TBCR: 90.0000 mg | EXTENDED_RELEASE_TABLET | Freq: Three times a day (TID) | ORAL | Status: DC

## 2014-11-08 ENCOUNTER — Ambulatory Visit
Admission: RE | Admit: 2014-11-08 | Discharge: 2014-11-08 | Disposition: A | Discharge status: Home / Self Care | Payer: Medicare Other | Source: Ambulatory Visit | Attending: Radiation Oncology | Admitting: Radiation Oncology

## 2014-11-08 ENCOUNTER — Other Ambulatory Visit: Payer: Self-pay | Admitting: Family Medicine

## 2014-11-08 ENCOUNTER — Ambulatory Visit: Payer: Medicare Other

## 2014-11-08 DIAGNOSIS — Z510 Encounter for antineoplastic radiation therapy: Secondary | ICD-10-CM | POA: Diagnosis not present

## 2014-11-09 ENCOUNTER — Ambulatory Visit
Admission: RE | Admit: 2014-11-09 | Discharge: 2014-11-09 | Disposition: A | Discharge status: Home / Self Care | Payer: Medicare Other | Source: Ambulatory Visit | Attending: Radiation Oncology | Admitting: Radiation Oncology

## 2014-11-09 ENCOUNTER — Ambulatory Visit: Payer: Medicare Other

## 2014-11-09 ENCOUNTER — Other Ambulatory Visit: Payer: Self-pay | Admitting: Nurse Practitioner

## 2014-11-09 DIAGNOSIS — Z510 Encounter for antineoplastic radiation therapy: Secondary | ICD-10-CM | POA: Diagnosis not present

## 2014-11-10 ENCOUNTER — Other Ambulatory Visit: Payer: Self-pay | Admitting: Medical Oncology

## 2014-11-10 ENCOUNTER — Ambulatory Visit
Admission: RE | Admit: 2014-11-10 | Discharge: 2014-11-10 | Disposition: A | Discharge status: Home / Self Care | Payer: Medicare Other | Source: Ambulatory Visit | Attending: Radiation Oncology | Admitting: Radiation Oncology

## 2014-11-10 ENCOUNTER — Telehealth: Payer: Self-pay | Admitting: *Deleted

## 2014-11-10 ENCOUNTER — Ambulatory Visit: Payer: Medicare Other

## 2014-11-10 DIAGNOSIS — C250 Malignant neoplasm of head of pancreas: Secondary | ICD-10-CM

## 2014-11-10 DIAGNOSIS — Z510 Encounter for antineoplastic radiation therapy: Secondary | ICD-10-CM | POA: Diagnosis not present

## 2014-11-10 NOTE — Telephone Encounter (Signed)
Spoke with patient will come in tomorrow 810pm to see MD first before tx, patient agreed 6:56 PM

## 2014-11-10 NOTE — Progress Notes (Signed)
Department of Radiation Oncology  Phone:  [phone] Fax:        [phone]  Weekly Treatment Note    Name: Ray [name] Date: 11/10/2014 MRN: [id number] DOB: 14-Sep-1955   Current dose: 25.2 Gy  Current fraction:14   MEDICATIONS: Current Outpatient Prescriptions  Medication Sig Dispense Refill  . amLODipine (NORVASC) 10 MG tablet Take 1 tablet (10 mg total) by mouth daily. Need PCP appointment. 30 tablet 0  . capecitabine (XELODA) 500 MG tablet Take 3 tablets (1,500 mg total) by mouth 2 (two) times daily after a meal. Take on days of radiation therapy only (Monday-Friday) 168 tablet 0  . DULoxetine (CYMBALTA) 30 MG capsule TAKE 1 CAPSULE DAILY 30 capsule 1  . hyaluronate sodium (RADIAPLEXRX) GEL Apply 1 application topically daily.    Marland Kitchen KLOR-CON M20 20 MEQ tablet TAKE 1 TABLET DAILY 90 tablet 1  . lidocaine-prilocaine (EMLA) cream Apply over port area 1-2 hours prior to treatment and cover with plastic wrap.  DO NOT RUB IN. 30 g 3  . loperamide (IMODIUM) 2 MG capsule Take 1 capsule (2 mg total) by mouth as needed for diarrhea or loose stools. Maximum 8 tabs per day (Patient not taking: Reported on 11/04/2014) 30 capsule 2  . LORazepam (ATIVAN) 1 MG tablet Take 1 mg by mouth as needed.  0  . megestrol (MEGACE ORAL) 40 MG/ML suspension Take 5 mLs (200 mg total) by mouth 2 (two) times daily. 240 mL 1  . morphine (MS CONTIN) 30 MG 12 hr tablet Take 3 tablets (90 mg total) by mouth every 8 (eight) hours. 270 tablet 0  . morphine (MSIR) 30 MG tablet Take 1 tablet (30 mg total) by mouth every 4 (four) hours as needed for moderate pain. 75 tablet 0  . omeprazole (PRILOSEC) 40 MG capsule TAKE 1 CAPSULE DAILY 90 capsule 3  . ondansetron (ZOFRAN) 8 MG tablet Take 1 tablet (8 mg total) by mouth every 8 (eight) hours as needed for nausea or vomiting. (Patient not taking: Reported on 11/04/2014) 30 tablet 2  . Polyethyl Glycol-Propyl Glycol (SYSTANE) 0.4-0.3 % SOLN Apply 1 drop to eye 3  (three) times daily as needed (dry eyes).    . prochlorperazine (COMPAZINE) 10 MG tablet Take 1 tablet (10 mg total) by mouth every 6 (six) hours as needed for nausea or vomiting. 60 tablet 2  . rivaroxaban (XARELTO) 20 MG TABS tablet Take 1 tablet (20 mg total) by mouth every morning. 30 tablet 0  . sorbitol 70 % SOLN Take 15 ml - 30 ml by mouth 2 (two) times a days as needed for constipation (Patient not taking: Reported on 11/04/2014) 1000 mL 1  . VIAGRA 50 MG tablet TAKE 1 TABLET AS NEEDED (Patient not taking: Reported on 11/04/2014) 18 tablet 1   No current facility-administered medications for this encounter.     ALLERGIES: Review of patient's allergies indicates no known allergies.   LABORATORY DATA:  Lab Results  Component Value Date   WBC 6.6 10/28/2014   HGB 12.1* 10/28/2014   HCT 37.7* 10/28/2014   MCV 81.2 10/28/2014   PLT 164 10/28/2014   Lab Results  Component Value Date   NA 135* 10/12/2014   K 4.3 10/12/2014   CL 102 04/07/2014   CO2 27 10/12/2014   Lab Results  Component Value Date   ALT 10 10/12/2014   AST 11 10/12/2014   ALKPHOS 85 10/12/2014   BILITOT 0.50 10/12/2014     NARRATIVE: Ray Crigler  [name] was seen today for weekly treatment management. The chart was checked and the patient's films were reviewed.  The patient states he is done very well over the last week. Good appetite and he denies any nausea.  PHYSICAL EXAMINATION: vitals were not taken for this visit.     Alert, in no acute distress. In good spirits.  ASSESSMENT: The patient is doing satisfactorily with treatment.  PLAN: We will continue with the patient's radiation treatment as planned.

## 2014-11-11 ENCOUNTER — Ambulatory Visit: Admission: RE | Admit: 2014-11-11 | Payer: Medicare Other | Source: Ambulatory Visit | Admitting: Radiation Oncology

## 2014-11-11 ENCOUNTER — Ambulatory Visit: Payer: Medicare Other

## 2014-11-11 ENCOUNTER — Ambulatory Visit
Admission: RE | Admit: 2014-11-11 | Discharge: 2014-11-11 | Disposition: A | Discharge status: Home / Self Care | Payer: Medicare Other | Source: Ambulatory Visit | Attending: Radiation Oncology | Admitting: Radiation Oncology

## 2014-11-11 ENCOUNTER — Ambulatory Visit: Admission: RE | Admit: 2014-11-11 | Payer: Medicare Other | Source: Ambulatory Visit

## 2014-11-11 DIAGNOSIS — Z510 Encounter for antineoplastic radiation therapy: Secondary | ICD-10-CM | POA: Diagnosis not present

## 2014-11-11 NOTE — Telephone Encounter (Signed)
Notified pt that MSIR refill ready for p/u.  Pt verbalized understanding and expressed appreciation for call.

## 2014-11-14 ENCOUNTER — Ambulatory Visit
Admission: RE | Admit: 2014-11-14 | Discharge: 2014-11-14 | Disposition: A | Discharge status: Home / Self Care | Payer: Medicare Other | Source: Ambulatory Visit | Attending: Radiation Oncology | Admitting: Radiation Oncology

## 2014-11-14 ENCOUNTER — Ambulatory Visit: Payer: Medicare Other

## 2014-11-14 DIAGNOSIS — Z510 Encounter for antineoplastic radiation therapy: Secondary | ICD-10-CM | POA: Diagnosis not present

## 2014-11-15 ENCOUNTER — Other Ambulatory Visit: Payer: Self-pay | Admitting: Oncology

## 2014-11-15 ENCOUNTER — Other Ambulatory Visit (HOSPITAL_BASED_OUTPATIENT_CLINIC_OR_DEPARTMENT_OTHER): Payer: Medicare Other

## 2014-11-15 ENCOUNTER — Ambulatory Visit
Admission: RE | Admit: 2014-11-15 | Discharge: 2014-11-15 | Disposition: A | Discharge status: Home / Self Care | Payer: Medicare Other | Source: Ambulatory Visit | Attending: Radiation Oncology | Admitting: Radiation Oncology

## 2014-11-15 ENCOUNTER — Ambulatory Visit: Payer: Medicare Other

## 2014-11-15 ENCOUNTER — Ambulatory Visit (HOSPITAL_BASED_OUTPATIENT_CLINIC_OR_DEPARTMENT_OTHER): Payer: Medicare Other | Admitting: Nurse Practitioner

## 2014-11-15 VITALS — BP 125/90 | HR 93 | Temp 98.5°F | Resp 18 | Ht 72.0 in | Wt 134.5 lb

## 2014-11-15 DIAGNOSIS — C259 Malignant neoplasm of pancreas, unspecified: Secondary | ICD-10-CM | POA: Diagnosis present

## 2014-11-15 DIAGNOSIS — R109 Unspecified abdominal pain: Secondary | ICD-10-CM | POA: Diagnosis not present

## 2014-11-15 DIAGNOSIS — R234 Changes in skin texture: Secondary | ICD-10-CM

## 2014-11-15 DIAGNOSIS — Z510 Encounter for antineoplastic radiation therapy: Secondary | ICD-10-CM | POA: Diagnosis not present

## 2014-11-15 DIAGNOSIS — Z86718 Personal history of other venous thrombosis and embolism: Secondary | ICD-10-CM

## 2014-11-15 DIAGNOSIS — C787 Secondary malignant neoplasm of liver and intrahepatic bile duct: Secondary | ICD-10-CM | POA: Diagnosis not present

## 2014-11-15 DIAGNOSIS — C250 Malignant neoplasm of head of pancreas: Secondary | ICD-10-CM

## 2014-11-15 DIAGNOSIS — R630 Anorexia: Secondary | ICD-10-CM

## 2014-11-15 LAB — COMPREHENSIVE METABOLIC PANEL (CC13)
ALT: <9 U/L (ref 0–55)
ANION GAP: 11 meq/L (ref 3–11)
AST: 13 U/L (ref 5–34)
Albumin: 4.3 g/dL (ref 3.5–5.0)
Alkaline Phosphatase: 85 U/L (ref 40–150)
BILIRUBIN TOTAL: 0.68 mg/dL (ref 0.20–1.20)
BUN: 9.8 mg/dL (ref 7.0–26.0)
CHLORIDE: 106 meq/L (ref 98–109)
CO2: 23 meq/L (ref 22–29)
Calcium: 9.8 mg/dL (ref 8.4–10.4)
Creatinine: 1 mg/dL (ref 0.7–1.3)
GLUCOSE: 132 mg/dL (ref 70–140)
POTASSIUM: 3.7 meq/L (ref 3.5–5.1)
SODIUM: 140 meq/L (ref 136–145)
TOTAL PROTEIN: 7.4 g/dL (ref 6.4–8.3)

## 2014-11-15 LAB — CBC WITH DIFFERENTIAL/PLATELET
BASO%: 0 % (ref 0.0–2.0)
BASOS ABS: 0 10*3/uL (ref 0.0–0.1)
EOS ABS: 0 10*3/uL (ref 0.0–0.5)
EOS%: 0.5 % (ref 0.0–7.0)
HCT: 38.8 % (ref 38.4–49.9)
HGB: 13.4 g/dL (ref 13.0–17.1)
LYMPH%: 8.1 % — AB (ref 14.0–49.0)
MCH: 27.1 pg — ABNORMAL LOW (ref 27.2–33.4)
MCHC: 34.5 g/dL (ref 32.0–36.0)
MCV: 78.5 fL — AB (ref 79.3–98.0)
MONO#: 0.4 10*3/uL (ref 0.1–0.9)
MONO%: 9.7 % (ref 0.0–14.0)
NEUT#: 3.6 10*3/uL (ref 1.5–6.5)
NEUT%: 81.7 % — ABNORMAL HIGH (ref 39.0–75.0)
NRBC: 0 % (ref 0–0)
RBC: 4.94 10*6/uL (ref 4.20–5.82)
RDW: 17.5 % — AB (ref 11.0–14.6)
WBC: 4.3 10*3/uL (ref 4.0–10.3)
lymph#: 0.4 10*3/uL — ABNORMAL LOW (ref 0.9–3.3)

## 2014-11-15 LAB — TECHNOLOGIST REVIEW

## 2014-11-15 MED ORDER — LORAZEPAM 1 MG PO TABS: ORAL_TABLET | ORAL | Status: DC

## 2014-11-15 MED ORDER — CAPECITABINE 500 MG PO TABS: 1500.0000 mg | ORAL_TABLET | Freq: Two times a day (BID) | ORAL | Status: DC

## 2014-11-15 NOTE — Progress Notes (Signed)
[facility] OFFICE PROGRESS NOTE   Diagnosis:  Pancreas cancer  INTERVAL HISTORY:   Ray [name] returns as scheduled. He continues radiation/Xeloda. He denies nausea/vomiting. No mouth sores. No diarrhea. No hand or foot pain or redness. Abdominal pain is better. He continues MS Contin 3 times a day. He rarely takes break through pain medication. Appetite varies. Energy level is poor.  Objective:  Vital signs in last 24 hours:  Blood pressure 125/90, pulse 93, temperature 98.5 F (36.9 C), temperature source Oral, resp. rate 18, height 6' (1.829 m), weight 134 lb 8 oz (61.009 kg), SpO2 100 %.    HEENT: No thrush or ulcers. Resp: Lungs clear bilaterally. Cardio: Regular rate and rhythm. GI: Abdomen soft and nontender. No mass. No hepatomegaly. Vascular: No leg edema. Skin: Dry appearance.  Port-A-Cath without erythema.  Lab Results:  Lab Results  Component Value Date   WBC 4.3 11/15/2014   HGB 13.4 11/15/2014   HCT 38.8 11/15/2014   MCV 78.5* 11/15/2014   PLT 140 Occ Large & giant platelets 11/15/2014   NEUTROABS 3.6 11/15/2014    Imaging:  No results found.  Medications: I have reviewed the patient's current medications.  Assessment/Plan: 1. Pancreas cancer.  07/06/2013 ,CA 19-9- 2067.   CT abdomen/pelvis 07/09/2013 with interval development of amorphous soft tissue circumferentially encasing the celiac axis and tracking caudally down toward the SMA. Associated stable irregular mild distention of the main pancreatic duct in the body and tail of the pancreas with no discrete mass lesion seen. 2.2 x 1.4 cm portacaval lymph node in the hepatoduodenal ligament had increased in size from 1.3 x 1.1 cm previously. Other scattered small lymph nodes seen in the hepatoduodenal ligament.   Endoscopic ultrasound 07/15/2013 with a very vague masslike lesion in the region of the uncinate pancreas. Fine needle aspiration showed atypical cells consistent with  adenocarcinoma.   PET scan 07/29/2013 with hypermetabolic soft tissue posterior to the pancreas with an adjacent hypermetabolic lymph node, no hypermetabolic pancreas mass, no evidence of distant metastatic disease   Cycle 1 FOLFIRINOX 08/02/2013.   Cycle 2 FOLFIRINOX 08/16/2013.   Cycle 3 FOLFIRINOX 09/06/2013.   Cycle 4 FOLFIRINOX 09/21/2013.   CA 19-9 improved on 10/04/2013.   Cycle 5 FOLFIRINOX 10/04/2013.   Cycle 6 FOLFIRINOX 10/18/2013   Restaging CT 10/22/2013 with no change in the soft tissue surrounding the pancreas/celiac axis, scattered indeterminate low-attenuation liver lesions are more conspicuous.  Cycle 7 FOLFIRINOX 11/08/2013  Cycle 8 FOLFIRINOX 11/22/2013  Cycle 9 FOLFIRINOX 12/06/2013  Cycle 10 FOLFIRI (oxaliplatin eliminated) 12/20/2013.  Cycle 11 FOLFIRI 01/03/2014  Restaging CT 01/12/2014-no change in the soft tissue posterior to the pancreas or an 8 mm hepatic metastasis  Cycle 12 FOLFIRI 01/17/2014.  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, soft tissue density encasing the celiac axis, a possible 9 mm hepatic metastasis  Initiation of gemcitabine/Abraxane on a day one/day 8 schedule 04/11/2014  Cycle 2 gemcitabine/Abraxane 05/02/2014, 05/09/2014  CA-19-9 improved 05/30/2014  Cycle 3 gemcitabine/Abraxane 05/30/2014, 06/06/2014  Cycle 4 gemcitabine/Abraxane 06/20/2014, 06/27/2014  Cycle 5 gemcitabine/Abraxane 07/11/2014, 07/18/2014  Cycle 6 gemcitabine/Abraxane 08/01/2014 (day 1 only; patient canceled day 8)  CA-19-9 improved 08/01/2014  Cycle 7 gemcitabine/Abraxane 08/22/2014  CT abdomen/pelvis 09/16/2014 with no dominant pancreatic mass identified. Increase in retroperitoneal pancreatic soft tissue infiltration at the level of the celiac axis and superior mesenteric artery.  Initiation of  Xeloda/radiation 10/13/2014 2. Abdominal pain secondary to #1.   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  Status post celiac block 03/31/2014.  Abdominal pain is markedly improved. Decrease MS Contin to 90 mg every 12 hours beginning 08/01/2014 (previously 90 mg every 8 hours). He had increased pain with the dose decrease. He currently takes MS Contin 90 mg every 8 hours. Abdominal pain is well-controlled. 3. Hypertension. 4. History of gout. 5. Nausea and vomiting following FOLFIRINOX chemotherapy.  Aloxi and Emend added with cycle 2.   Decadron added with cycle 3. 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 7. Hospitalization 08/24/2013 through 08/27/2013 with poorly controlled nausea/vomiting. Diarrhea during the hospitalization. 8. History of Skin hyperpigmentation and dryness secondary to 5-fluorouracil 9. Diffuse dry desquamation of the skin following gemcitabine/Abraxane 08/22/2014. Improved 09/19/2014.   Disposition: Ray [name] continues Xeloda/radiation. Abdominal pain is significantly better. He will continue MS Contin. He rarely takes breakthrough pain medication.  He is scheduled to complete the course of radiation on 11/30/2014. We will schedule a follow-up visit approximate 2 weeks post completion of radiation. He will contact the office in the interim with any problems.  Plan reviewed with [name].    Ned Card ANP/GNP-BC   11/15/2014  3:56 PM

## 2014-11-16 ENCOUNTER — Encounter: Payer: Self-pay | Admitting: Radiation Oncology

## 2014-11-16 ENCOUNTER — Encounter: Payer: Self-pay | Admitting: *Deleted

## 2014-11-16 ENCOUNTER — Telehealth: Payer: Self-pay | Admitting: Oncology

## 2014-11-16 ENCOUNTER — Ambulatory Visit: Payer: Medicare Other

## 2014-11-16 ENCOUNTER — Ambulatory Visit
Admission: RE | Admit: 2014-11-16 | Discharge: 2014-11-16 | Disposition: A | Discharge status: Home / Self Care | Payer: Medicare Other | Source: Ambulatory Visit | Attending: Radiation Oncology | Admitting: Radiation Oncology

## 2014-11-16 DIAGNOSIS — Z510 Encounter for antineoplastic radiation therapy: Secondary | ICD-10-CM | POA: Diagnosis not present

## 2014-11-16 NOTE — Progress Notes (Signed)
[place] Work  Clinical Social Work was referred by Animal nutritionist for assessment of psychosocial needs due to wife Film/video editor.  Clinical Social Worker contacted patient's wife at home to offer support and assess for needs.  CSW and wife discussed the differences in [facility] and CNA services. She has already left message for [facility] for assistance through [place]. She is aware the [place] will advise as to what agencies are in-networkThey may also be interested in PT/OT at home. They plan to discuss this with Dr [name] at his next medonc appt. Wife is also interested in the Caregiver Workshop in November. Wife reports her daughter will be here to help some next week as well and they will reach out as needed.   Clinical Social Work interventions: Resource education Supportive listening Loren Racer, [place] Worker [place]  [place] Phone: [phone] Fax: [phone]

## 2014-11-16 NOTE — Telephone Encounter (Signed)
s.w. pt and advised on NOV appt....pt ok and aware °

## 2014-11-17 ENCOUNTER — Ambulatory Visit: Payer: Medicare Other | Admitting: Radiation Oncology

## 2014-11-17 ENCOUNTER — Ambulatory Visit
Admission: RE | Admit: 2014-11-17 | Discharge: 2014-11-17 | Disposition: A | Discharge status: Home / Self Care | Payer: Medicare Other | Source: Ambulatory Visit | Attending: Radiation Oncology | Admitting: Radiation Oncology

## 2014-11-17 ENCOUNTER — Ambulatory Visit: Payer: Medicare Other

## 2014-11-17 DIAGNOSIS — Z510 Encounter for antineoplastic radiation therapy: Secondary | ICD-10-CM | POA: Diagnosis not present

## 2014-11-18 ENCOUNTER — Ambulatory Visit: Payer: Medicare Other

## 2014-11-18 ENCOUNTER — Ambulatory Visit
Admission: RE | Admit: 2014-11-18 | Discharge: 2014-11-18 | Disposition: A | Discharge status: Home / Self Care | Payer: Medicare Other | Source: Ambulatory Visit | Attending: Radiation Oncology | Admitting: Radiation Oncology

## 2014-11-18 ENCOUNTER — Encounter: Payer: Self-pay | Admitting: Radiation Oncology

## 2014-11-18 VITALS — BP 135/91 | HR 90 | Temp 99.0°F | Resp 16 | Ht 72.0 in | Wt 137.1 lb

## 2014-11-18 DIAGNOSIS — Z510 Encounter for antineoplastic radiation therapy: Secondary | ICD-10-CM | POA: Diagnosis not present

## 2014-11-18 DIAGNOSIS — C250 Malignant neoplasm of head of pancreas: Secondary | ICD-10-CM

## 2014-11-18 NOTE — Progress Notes (Signed)
  Radiation Oncology         [id number]) [id number] ________________________________  Name: Ray [name] MRN: [id number]  Date: 11/16/2014  DOB: 20-Jun-1955  COMPLEX SIMULATION  NOTE  Diagnosis: Pancreatic cancer  Narrative The patient has initially been planned to receive a course of radiation treatment to a dose of 45 gray in 25 fractions at 1.8 gray per fraction. The patient will now receive a boost to the high risk target volume for an additional 5.4 gray. This will be delivered in 3 fractions at 1.8 gray per fraction and a cone down boost technique will be utilized. To accomplish this, an additional 3 customized blocks have been designed for this purpose. A complex isodose plan is requested to ensure that the high-risk target region receives the appropriate radiation dose and that the nearby normal structures continue to be appropriately spared. The patient's final total dose therefore will be 50.4 gray.   ________________________________ ------------------------------------------------  Jodelle Gross, MD, PhD

## 2014-11-18 NOTE — Progress Notes (Signed)
Randen Kauth has completed 20 fractions to his pancreas.  He reports his pain has really improved.  He is only taking the morphine 30 mg q 12 hours and does not need breakthrough medication.  He is also taking xeloda twice a day.  He reports having some nausea and takes compazine PRN.  He reports his appetite is improving.  He reports his last bowel movement was today.  He denies having skin irritation.  He does report feeling fatigued.  BP 135/91 mmHg  Pulse 90  Temp(Src) 99 F (37.2 C) (Oral)  Resp 16  Ht 6' (1.829 m)  Wt 137 lb 1.6 oz (62.188 kg)  BMI 18.59 kg/m2   Wt Readings from Last 3 Encounters:  11/18/14 137 lb 1.6 oz (62.188 kg)  11/15/14 134 lb 8 oz (61.009 kg)  11/04/14 139 lb 9.6 oz (63.322 kg)

## 2014-11-18 NOTE — Progress Notes (Signed)
Department of Radiation Oncology  Phone:  [phone] Fax:        [phone]  Weekly Treatment Note    Name: Ray [name] Date: 11/18/2014 MRN: [id number] DOB: 1955-05-11   Current dose: 36 Gy  Current fraction:20   MEDICATIONS: Current Outpatient Prescriptions  Medication Sig Dispense Refill  . amLODipine (NORVASC) 10 MG tablet Take 1 tablet (10 mg total) by mouth daily. Need PCP appointment. 30 tablet 0  . capecitabine (XELODA) 500 MG tablet Take 3 tablets (1,500 mg total) by mouth 2 (two) times daily after a meal. Take on days of radiation therapy only (Monday-Friday) 48 tablet 0  . DULoxetine (CYMBALTA) 30 MG capsule TAKE 1 CAPSULE DAILY 30 capsule 1  . KLOR-CON M20 20 MEQ tablet TAKE 1 TABLET DAILY 90 tablet 1  . lidocaine-prilocaine (EMLA) cream Apply over port area 1-2 hours prior to treatment and cover with plastic wrap.  DO NOT RUB IN. 30 g 3  . LORazepam (ATIVAN) 1 MG tablet Take 1 tablet (1 mg) one hour prior to radiation 15 tablet 0  . morphine (MS CONTIN) 30 MG 12 hr tablet Take 3 tablets (90 mg total) by mouth every 8 (eight) hours. 270 tablet 0  . omeprazole (PRILOSEC) 40 MG capsule TAKE 1 CAPSULE DAILY 90 capsule 3  . Polyethyl Glycol-Propyl Glycol (SYSTANE) 0.4-0.3 % SOLN Apply 1 drop to eye 3 (three) times daily as needed (dry eyes).    . prochlorperazine (COMPAZINE) 10 MG tablet Take 1 tablet (10 mg total) by mouth every 6 (six) hours as needed for nausea or vomiting. 60 tablet 2  . rivaroxaban (XARELTO) 20 MG TABS tablet Take 1 tablet (20 mg total) by mouth every morning. 30 tablet 0  . hyaluronate sodium (RADIAPLEXRX) GEL Apply 1 application topically daily.    Marland Kitchen loperamide (IMODIUM) 2 MG capsule Take 1 capsule (2 mg total) by mouth as needed for diarrhea or loose stools. Maximum 8 tabs per day (Patient not taking: Reported on 11/04/2014) 30 capsule 2  . megestrol (MEGACE ORAL) 40 MG/ML suspension Take 5 mLs (200 mg total) by mouth 2 (two) times daily.  (Patient not taking: Reported on 11/18/2014) 240 mL 1  . morphine (MSIR) 30 MG tablet TAKE 1 TABLET BY MOUTH EVERY 4 HOURS AS NEEDED FOR MODERATE PAIN (Patient not taking: Reported on 11/18/2014) 75 tablet 0  . ondansetron (ZOFRAN) 8 MG tablet Take 1 tablet (8 mg total) by mouth every 8 (eight) hours as needed for nausea or vomiting. (Patient not taking: Reported on 11/04/2014) 30 tablet 2  . sorbitol 70 % SOLN Take 15 ml - 30 ml by mouth 2 (two) times a days as needed for constipation (Patient not taking: Reported on 11/04/2014) 1000 mL 1  . VIAGRA 50 MG tablet TAKE 1 TABLET AS NEEDED (Patient not taking: Reported on 11/04/2014) 18 tablet 1   No current facility-administered medications for this encounter.     ALLERGIES: Review of patient's allergies indicates no known allergies.   LABORATORY DATA:  Lab Results  Component Value Date   WBC 4.3 11/15/2014   HGB 13.4 11/15/2014   HCT 38.8 11/15/2014   MCV 78.5* 11/15/2014   PLT 140 Occ Large & giant platelets 11/15/2014   Lab Results  Component Value Date   NA 140 11/15/2014   K 3.7 11/15/2014   CL 102 04/07/2014   CO2 23 11/15/2014   Lab Results  Component Value Date   ALT <9 11/15/2014   AST 13 11/15/2014  ALKPHOS 85 11/15/2014   BILITOT 0.68 11/15/2014     NARRATIVE: MACAULAY REICHER was seen today for weekly treatment management. The chart was checked and the patient's films were reviewed.  Kendrik Mcshan has completed 20 fractions to his pancreas.  He reports his pain has really improved.  He is only taking the morphine 30 mg q 12 hours and does not need breakthrough medication.  He is also taking xeloda twice a day.  He reports having some nausea and takes compazine PRN.  He reports his appetite is improving.  He reports his last bowel movement was today.  He denies having skin irritation.  He does report feeling fatigued.  BP 135/91 mmHg  Pulse 90  Temp(Src) 99 F (37.2 C) (Oral)  Resp 16  Ht 6' (1.829 m)  Wt 137 lb  1.6 oz (62.188 kg)  BMI 18.59 kg/m2   Wt Readings from Last 3 Encounters:  11/18/14 137 lb 1.6 oz (62.188 kg)  11/15/14 134 lb 8 oz (61.009 kg)  11/04/14 139 lb 9.6 oz (63.322 kg)    PHYSICAL EXAMINATION: height is 6' (1.829 m) and weight is 137 lb 1.6 oz (62.188 kg). His oral temperature is 99 F (37.2 C). His blood pressure is 135/91 and his pulse is 90. His respiration is 16.        ASSESSMENT: The patient is doing satisfactorily with treatment.  PLAN: We will continue with the patient's radiation treatment as planned. The patient is doing excellent at this time. Pain improved.

## 2014-11-21 ENCOUNTER — Ambulatory Visit: Payer: Medicare Other

## 2014-11-21 ENCOUNTER — Ambulatory Visit: Payer: Medicare Other | Admitting: Radiation Oncology

## 2014-11-21 ENCOUNTER — Ambulatory Visit
Admission: RE | Admit: 2014-11-21 | Discharge: 2014-11-21 | Disposition: A | Discharge status: Home / Self Care | Payer: Medicare Other | Source: Ambulatory Visit | Attending: Radiation Oncology | Admitting: Radiation Oncology

## 2014-11-21 DIAGNOSIS — Z510 Encounter for antineoplastic radiation therapy: Secondary | ICD-10-CM | POA: Diagnosis not present

## 2014-11-22 ENCOUNTER — Ambulatory Visit: Payer: Medicare Other

## 2014-11-22 ENCOUNTER — Ambulatory Visit: Payer: Medicare Other | Admitting: Radiation Oncology

## 2014-11-22 ENCOUNTER — Ambulatory Visit
Admission: RE | Admit: 2014-11-22 | Discharge: 2014-11-22 | Disposition: A | Discharge status: Home / Self Care | Payer: Medicare Other | Source: Ambulatory Visit | Attending: Radiation Oncology | Admitting: Radiation Oncology

## 2014-11-22 DIAGNOSIS — Z510 Encounter for antineoplastic radiation therapy: Secondary | ICD-10-CM | POA: Diagnosis not present

## 2014-11-23 ENCOUNTER — Ambulatory Visit
Admission: RE | Admit: 2014-11-23 | Discharge: 2014-11-23 | Disposition: A | Discharge status: Home / Self Care | Payer: Medicare Other | Source: Ambulatory Visit | Attending: Radiation Oncology | Admitting: Radiation Oncology

## 2014-11-23 ENCOUNTER — Ambulatory Visit: Payer: Medicare Other

## 2014-11-23 DIAGNOSIS — Z510 Encounter for antineoplastic radiation therapy: Secondary | ICD-10-CM | POA: Diagnosis not present

## 2014-11-24 ENCOUNTER — Ambulatory Visit: Payer: Medicare Other | Admitting: Radiation Oncology

## 2014-11-24 ENCOUNTER — Ambulatory Visit: Payer: Medicare Other

## 2014-11-24 ENCOUNTER — Ambulatory Visit
Admission: RE | Admit: 2014-11-24 | Discharge: 2014-11-24 | Disposition: A | Discharge status: Home / Self Care | Payer: Medicare Other | Source: Ambulatory Visit | Attending: Radiation Oncology | Admitting: Radiation Oncology

## 2014-11-24 DIAGNOSIS — Z510 Encounter for antineoplastic radiation therapy: Secondary | ICD-10-CM | POA: Diagnosis not present

## 2014-11-25 ENCOUNTER — Encounter: Payer: Self-pay | Admitting: Radiation Oncology

## 2014-11-25 ENCOUNTER — Ambulatory Visit
Admission: RE | Admit: 2014-11-25 | Discharge: 2014-11-25 | Disposition: A | Discharge status: Home / Self Care | Payer: Medicare Other | Source: Ambulatory Visit | Attending: Radiation Oncology | Admitting: Radiation Oncology

## 2014-11-25 VITALS — BP 111/79 | HR 80 | Temp 98.3°F | Ht 72.0 in | Wt 133.5 lb

## 2014-11-25 DIAGNOSIS — C250 Malignant neoplasm of head of pancreas: Secondary | ICD-10-CM

## 2014-11-25 DIAGNOSIS — Z510 Encounter for antineoplastic radiation therapy: Secondary | ICD-10-CM | POA: Diagnosis not present

## 2014-11-25 NOTE — Progress Notes (Signed)
Department of Radiation Oncology  Phone:  [phone] Fax:        [phone]  Weekly Treatment Note    Name: Ray [name] Date: 11/25/2014 MRN: [id number] DOB: 25-Jan-1956   Current dose: 45 Gy  Current fraction: 25   MEDICATIONS: Current Outpatient Prescriptions  Medication Sig Dispense Refill  . amLODipine (NORVASC) 10 MG tablet Take 1 tablet (10 mg total) by mouth daily. Need PCP appointment. 30 tablet 0  . capecitabine (XELODA) 500 MG tablet Take 3 tablets (1,500 mg total) by mouth 2 (two) times daily after a meal. Take on days of radiation therapy only (Monday-Friday) 48 tablet 0  . DULoxetine (CYMBALTA) 30 MG capsule TAKE 1 CAPSULE DAILY 30 capsule 1  . KLOR-CON M20 20 MEQ tablet TAKE 1 TABLET DAILY 90 tablet 1  . lidocaine-prilocaine (EMLA) cream Apply over port area 1-2 hours prior to treatment and cover with plastic wrap.  DO NOT RUB IN. 30 g 3  . LORazepam (ATIVAN) 1 MG tablet Take 1 tablet (1 mg) one hour prior to radiation 15 tablet 0  . morphine (MS CONTIN) 30 MG 12 hr tablet Take 3 tablets (90 mg total) by mouth every 8 (eight) hours. 270 tablet 0  . omeprazole (PRILOSEC) 40 MG capsule TAKE 1 CAPSULE DAILY 90 capsule 3  . Polyethyl Glycol-Propyl Glycol (SYSTANE) 0.4-0.3 % SOLN Apply 1 drop to eye 3 (three) times daily as needed (dry eyes).    . prochlorperazine (COMPAZINE) 10 MG tablet Take 1 tablet (10 mg total) by mouth every 6 (six) hours as needed for nausea or vomiting. 60 tablet 2  . rivaroxaban (XARELTO) 20 MG TABS tablet Take 1 tablet (20 mg total) by mouth every morning. 30 tablet 0  . hyaluronate sodium (RADIAPLEXRX) GEL Apply 1 application topically daily.    Marland Kitchen loperamide (IMODIUM) 2 MG capsule Take 1 capsule (2 mg total) by mouth as needed for diarrhea or loose stools. Maximum 8 tabs per day (Patient not taking: Reported on 11/04/2014) 30 capsule 2  . megestrol (MEGACE ORAL) 40 MG/ML suspension Take 5 mLs (200 mg total) by mouth 2 (two) times daily.  (Patient not taking: Reported on 11/18/2014) 240 mL 1  . morphine (MSIR) 30 MG tablet TAKE 1 TABLET BY MOUTH EVERY 4 HOURS AS NEEDED FOR MODERATE PAIN (Patient not taking: Reported on 11/18/2014) 75 tablet 0  . sorbitol 70 % SOLN Take 15 ml - 30 ml by mouth 2 (two) times a days as needed for constipation (Patient not taking: Reported on 11/04/2014) 1000 mL 1  . VIAGRA 50 MG tablet TAKE 1 TABLET AS NEEDED (Patient not taking: Reported on 11/04/2014) 18 tablet 1   No current facility-administered medications for this encounter.     ALLERGIES: Review of patient's allergies indicates no known allergies.   LABORATORY DATA:  Lab Results  Component Value Date   WBC 4.3 11/15/2014   HGB 13.4 11/15/2014   HCT 38.8 11/15/2014   MCV 78.5* 11/15/2014   PLT 140 Occ Large & giant platelets 11/15/2014   Lab Results  Component Value Date   NA 140 11/15/2014   K 3.7 11/15/2014   CL 102 04/07/2014   CO2 23 11/15/2014   Lab Results  Component Value Date   ALT <9 11/15/2014   AST 13 11/15/2014   ALKPHOS 85 11/15/2014   BILITOT 0.68 11/15/2014     NARRATIVE: Ray [name] was seen today for weekly treatment management. The chart was checked and the patient's films  were reviewed.  Ray [name] is here for his 75/28 fraction of radiation to his pancreas. He does admit to fatigue, and sits a lot of the day (85% of day, per patient), mostly just walking from his chair to the bathroom, to eat, or bed.  He reports a decreased appetite, and states he feels full quickly after starting to eat and cannot eat anymore. He reports that his bladder and bowels are moving well. He does report some nausea, but states the compazine once or twice a day helps.   BP 111/79 mmHg  Pulse 80  Temp(Src) 98.3 F (36.8 C)  Ht 6' (1.829 m)  Wt 133 lb 8 oz (60.555 kg)  BMI 18.10 kg/m2  SpO2 90%   Wt Readings from Last 3 Encounters:  11/25/14 133 lb 8 oz (60.555 kg)  11/18/14 137 lb 1.6 oz (62.188 kg)  11/15/14 134  lb 8 oz (61.009 kg)    PHYSICAL EXAMINATION: height is 6' (1.829 m) and weight is 133 lb 8 oz (60.555 kg). His temperature is 98.3 F (36.8 C). His blood pressure is 111/79 and his pulse is 80. His oxygen saturation is 90%.        ASSESSMENT: The patient is doing satisfactorily with treatment.   PLAN: We will continue with the patient's radiation treatment as planned. Ray [name] will complete radiation on next Wednesday, 10/26.  This document serves as a record of services personally performed by Kyung Rudd, MD. It was created on his behalf by Arlyce Harman, a trained medical scribe. The creation of this record is based on the scribe's personal observations and the provider's statements to them. This document has been checked and approved by the attending provider. ------------------------------------------------  Jodelle Gross, MD, PhD

## 2014-11-25 NOTE — Progress Notes (Signed)
Ray [name] is here for his 60/28 fraction of radiation to his pancreas. He does admit to fatigue, and sits a lot of the day (85% of day, per patient), mostly just walking from his chair to the bathroom, to eat, or bed.  He reports a decreased appetite, and states he feels full quickly after starting to eat and cannot eat anymore. He reports that his bladder and bowels are moving well. He does report some nausea, but states the compazine once or twice a day helps.   BP 111/79 mmHg  Pulse 80  Temp(Src) 98.3 F (36.8 C)  Ht 6' (1.829 m)  Wt 133 lb 8 oz (60.555 kg)  BMI 18.10 kg/m2  SpO2 90%   Wt Readings from Last 3 Encounters:  11/25/14 133 lb 8 oz (60.555 kg)  11/18/14 137 lb 1.6 oz (62.188 kg)  11/15/14 134 lb 8 oz (61.009 kg)

## 2014-11-28 ENCOUNTER — Ambulatory Visit
Admission: RE | Admit: 2014-11-28 | Discharge: 2014-11-28 | Disposition: A | Discharge status: Home / Self Care | Payer: Medicare Other | Source: Ambulatory Visit | Attending: Radiation Oncology | Admitting: Radiation Oncology

## 2014-11-28 ENCOUNTER — Telehealth: Payer: Self-pay | Admitting: *Deleted

## 2014-11-28 ENCOUNTER — Ambulatory Visit: Payer: Medicare Other

## 2014-11-28 NOTE — Telephone Encounter (Signed)
cvalled patient home, spoke with the wife, patient hasn't eaten anything today, drank sips of gingerale, ate 1 can peaches yestardqay, no appetite, she offered dto take him to the ED or call Dr. Learta Codding  Patient refused, , asked if he is nauseated to give him compazine and try to eat something blad in 1/2 hour to 1 hour later, if not any better in the morning may need to take him to the ED or see Selena Lesser, Np , could be getting dehyrated ,she will try, thanked this Rn for calling and checking on patient 1:36 PM

## 2014-11-29 ENCOUNTER — Telehealth: Payer: Self-pay | Admitting: *Deleted

## 2014-11-29 ENCOUNTER — Ambulatory Visit: Payer: Medicare Other

## 2014-11-29 ENCOUNTER — Ambulatory Visit
Admission: RE | Admit: 2014-11-29 | Discharge: 2014-11-29 | Disposition: A | Discharge status: Home / Self Care | Payer: Medicare Other | Source: Ambulatory Visit | Attending: Radiation Oncology | Admitting: Radiation Oncology

## 2014-11-29 NOTE — Telephone Encounter (Signed)
Returned call to patient wife, She stated "Ray [name] has decided no more treatments he is done, and refusing to  Go to the ED" asked that MD call her on her phone [phone], informed her that MD was off but will e-mail him and in basket him and have MD call tomorrow,she is satisfied with that and awaits MD call' called Rt and cancelled todays treatment, she did state"When he gets to bad I'll just call the ambulance for him to go to the hospital," he is not drinking or eating very little still" 9:10 AM  9:09 AM

## 2014-11-30 ENCOUNTER — Ambulatory Visit
Admission: RE | Admit: 2014-11-30 | Discharge: 2014-11-30 | Disposition: A | Discharge status: Home / Self Care | Payer: Medicare Other | Source: Ambulatory Visit | Attending: Radiation Oncology | Admitting: Radiation Oncology

## 2014-11-30 ENCOUNTER — Ambulatory Visit: Payer: Medicare Other | Admitting: Radiation Oncology

## 2014-11-30 ENCOUNTER — Ambulatory Visit: Payer: Medicare Other

## 2014-12-01 ENCOUNTER — Ambulatory Visit: Payer: Medicare Other

## 2014-12-02 ENCOUNTER — Ambulatory Visit: Payer: Medicare Other

## 2014-12-04 ENCOUNTER — Ambulatory Visit: Admission: RE | Admit: 2014-12-04 | Payer: Medicare Other | Source: Ambulatory Visit

## 2014-12-05 ENCOUNTER — Ambulatory Visit: Payer: Medicare Other

## 2014-12-05 ENCOUNTER — Encounter: Payer: Self-pay | Admitting: Radiation Oncology

## 2014-12-06 ENCOUNTER — Ambulatory Visit (INDEPENDENT_AMBULATORY_CARE_PROVIDER_SITE_OTHER): Payer: Medicare Other | Admitting: Family Medicine

## 2014-12-06 ENCOUNTER — Ambulatory Visit: Payer: Medicare Other

## 2014-12-06 ENCOUNTER — Encounter: Payer: Self-pay | Admitting: Family Medicine

## 2014-12-06 VITALS — BP 106/84 | HR 112 | Temp 98.6°F | Ht 72.0 in | Wt 127.0 lb

## 2014-12-06 DIAGNOSIS — Z7189 Other specified counseling: Secondary | ICD-10-CM

## 2014-12-06 DIAGNOSIS — F329 Major depressive disorder, single episode, unspecified: Secondary | ICD-10-CM | POA: Diagnosis not present

## 2014-12-06 DIAGNOSIS — I82890 Acute embolism and thrombosis of other specified veins: Secondary | ICD-10-CM

## 2014-12-06 DIAGNOSIS — Z23 Encounter for immunization: Secondary | ICD-10-CM | POA: Diagnosis present

## 2014-12-06 DIAGNOSIS — I10 Essential (primary) hypertension: Secondary | ICD-10-CM

## 2014-12-06 DIAGNOSIS — D735 Infarction of spleen: Secondary | ICD-10-CM | POA: Diagnosis present

## 2014-12-06 DIAGNOSIS — F32A Depression, unspecified: Secondary | ICD-10-CM

## 2014-12-06 MED ORDER — RIVAROXABAN 20 MG PO TABS: 20.0000 mg | ORAL_TABLET | Freq: Every morning | ORAL | Status: DC

## 2014-12-06 MED ORDER — POTASSIUM CHLORIDE CRYS ER 20 MEQ PO TBCR: 20.0000 meq | EXTENDED_RELEASE_TABLET | Freq: Every day | ORAL | Status: DC

## 2014-12-06 MED ORDER — DULOXETINE HCL 60 MG PO CPEP: 60.0000 mg | ORAL_CAPSULE | Freq: Every day | ORAL | Status: DC

## 2014-12-06 MED ORDER — OMEPRAZOLE 40 MG PO CPDR: 40.0000 mg | DELAYED_RELEASE_CAPSULE | Freq: Every day | ORAL | Status: DC

## 2014-12-06 NOTE — Assessment & Plan Note (Signed)
Patient is failing to thrive, as evidenced by his depression, limited PO intake, inability to sleep, and continued weight loss. The chemo and radiation he has undergone have never been with the intention of cure, as his cancer is uncurable. These treatments, and progression of his illness, have taken a toll on Ray [name] physically and emotionally. I had a lengthy discussion with both patient and his wife about the concept of hospice versus palliative care, and on the option to focus on comfort and quality of life. They are interested in this approach and do desire a referral to [facility]. He has an upcoming appointment with his oncologist [name], at which time they will explore further options for treatment. I will copy [name] on this note so that he is aware of the referral.

## 2014-12-06 NOTE — Progress Notes (Signed)
Date of Visit: 12/06/2014   HPI:  Patient presents for routine PCP follow up. He has just completed radiation therapy for pancreatic cancer, diagnosed in June 2015. He is accompanied by his wife [name], who is also my patient.  He had to cut the course short due to not tolerating the last several treatments well. He has also completed chemotherapy. Has upcoming appointment scheduled with oncologist [name].   Mr. Dasaro is not thriving. Per recent oncology notes, he sits in a chair for most of the day. He continues to lose weight, and is down 10lb from a couple of weeks ago. Weight was around 206 lb at diagnosis, presently 127 lb. He does not eat well or sleep well. Has no appetite and feels nauseated frequently. He endorses feelings of significant sadness about not being his prior self. He does not talk much at home or to anyone else. This is unusual for him. 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 He does not endorse having much anxiety. Pain has been well controlled, hasn't actually taken morphine in a couple of days.  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 Denies having any issues with bleeding. Needs this refilled, along with omeprazole, compazine, and potassium.    ROS: See HPI.  [place]: history of pancreatic cancer, GERD, gout, hypertension, chronic musculoskeletal pain (prior to pancreatic cancer diagnosis)  PHYSICAL EXAM: BP 106/84 mmHg  Pulse 112  Temp(Src) 98.6 F (37 C) (Oral)  Ht 6' (1.829 m)  Wt 127 lb (57.607 kg)  BMI 17.22 kg/m2 Gen: NAD. Cooperative and pleasant. Quiet. Cachectic.  HEENT: temporal wasting noted Heart: mildly tachycardic. Regular rhythm. Lungs: clear to auscultation bilaterally, NWOB Neuro: alert, speech nonfocal. Psych: tearful, weeping intermittently throughout visit. Denies SI/HI. Well groomed. Some decreased eye contact. Speech normal in rate and volume.  ASSESSMENT/PLAN:  Health maintenance:   -flu shot given today   Splenic vein thrombosis Recent Hgb stable, gets labs regularly through oncology teams. No bleeding concerns. 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 Refilled xarelto.  Goals of care, counseling/discussion Patient is failing to thrive, as evidenced by his depression, limited PO intake, inability to sleep, and continued weight loss. The chemo and radiation he has undergone have never been with the intention of cure, as his cancer is uncurable. These treatments, and progression of his illness, have taken a toll on Mr. Bluitt physically and emotionally. I had a lengthy discussion with both patient and his wife about the concept of hospice versus palliative care, and on the option to focus on comfort and quality of life. They are interested in this approach and do desire a referral to [facility]. He has an upcoming appointment with his oncologist [name], at which time they will explore further options for treatment. I will copy [name] on this note so that he is aware of the referral.  Depression Poorly controlled. Patient tearful and weeping during today's visit, endorses ongoing feelings of sadness and depression. He has not found ways of expressing these feelings with others. I encouraged him to allow himself to grieve and cry, and to talk to others about his feelings. I am hopeful that hospice & palliative care teams will be able to help in managing these symptoms, through medications or counseling. We are making a referral to them today. We also elected to increase cymbalta to 60mg  daily. Follow up with me in 1  month to see how things are going.   FOLLOW UP: F/u in 1 month for depression.  [place]. [name], [place] than 25 minutes were spent during this encounter, with at least 50% of the time devoted to counseling and coordination of care.

## 2014-12-06 NOTE — Patient Instructions (Signed)
Increase cymbalta to 2 pills daily. Refilled your medicines (potassium, omeprazole, compazine, xarelto) Follow up in 1 month to see how your mood is going I will call [facility] and [facility] [facility] and have them get in touch with [name].  Be well, Dr. [name]

## 2014-12-06 NOTE — Assessment & Plan Note (Signed)
Poorly controlled. Patient tearful and weeping during today's visit, endorses ongoing feelings of sadness and depression. He has not found ways of expressing these feelings with others. I encouraged him to allow himself to grieve and cry, and to talk to others about his feelings. I am hopeful that hospice & palliative care teams will be able to help in managing these symptoms, through medications or counseling. We are making a referral to them today. We also elected to increase cymbalta to 60mg  daily. Follow up with me in 1 month to see how things are going.

## 2014-12-06 NOTE — Assessment & Plan Note (Signed)
BP noted on low end today. As patient continues to lose weight, we will stop amlodipine at this time. Monitor BP at future visits. I doubt he will need to resume this.

## 2014-12-06 NOTE — Assessment & Plan Note (Addendum)
Recent Hgb stable, gets labs regularly through oncology teams. No bleeding concerns. 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 Refilled xarelto.

## 2014-12-07 ENCOUNTER — Ambulatory Visit: Payer: Medicare Other

## 2014-12-08 ENCOUNTER — Telehealth: Payer: Self-pay | Admitting: Family Medicine

## 2014-12-08 NOTE — Telephone Encounter (Signed)
I do not see a hospice referral in chart, will forward to PCP since this was discussed at last office visit.

## 2014-12-08 NOTE — Telephone Encounter (Signed)
Daughter called and would like [place] to send [facility] the following information so that they can do a hospice evaluation/ admission. Chart notes, diagnosis codes, medication list. Please fax these to [name] at [phone]. Blima Rich

## 2014-12-09 ENCOUNTER — Telehealth: Payer: Self-pay | Admitting: Family Medicine

## 2014-12-09 NOTE — Telephone Encounter (Signed)
I called patient's wife, Ray [name], to discuss this referral.  I had gone through the steps to refer Ray [name] to [facility] and [facility] (and had placed referral documentation in to-fax box earlier today).  Discovered during this call that patient and family have had a very difficult time today. They have been calling our office trying to get set up with [facility]. A representative from [facility] came out to the house yesterday and told them that as soon as our office sends off the referral information to their agency, that they can begin the hospice intake assessment. The family would like to go with this agency. Patient had a fall today. Ray [name] is very frustrated and actually had called the clinic after-hours line earlier and spoke with Dr. Lonny Prude about coordinating the referral. I happened to call her shortly thereafter, without realizing that they had spoken. I apologized for the confusion about which agency they would be going with, and that she has had a hard time getting through to [place].  Ray [name] gave me the contact information for the [employer] representative, Ray [name].  Office number [phone] Toll free number [phone] Cell number [phone]  I called Ray [name] on his cell phone and was able to clarify exactly what needs to happen in order for patient to be evaluated and admitted to hospice. It seems patient is deteriorating rather quickly. Ray [name] was very helpful and provided me with the fax number [phone]) to send the referral to. He stated the agency would be able to do the hospice intake assessment either tomorrow, or Sunday at the latest.  I completed the referral form for [facility] and have faxed all of this information over to them, including patient's latest office visits with me, medical oncology, and radiation oncology, in addition to recent labs, scans, weights, and insurance information. I also removed Ray [name] referral  information from to-fax box.  Updated wife Ray [name] on the phone that I would take care of faxing all of this over tonight. She was very Patent attorney.  This weekend, if Ray [name] or another family member calls with any concerns about Ray [name], please page me or call my cell number (on-call residents will have this available) and I will be happy to assist in answering their questions.  Patient likely has limited prognosis and it is very important we do what we can to help him find comfort and dignity, and to support his family through this process.  Leeanne Rio, MD

## 2014-12-09 NOTE — Telephone Encounter (Signed)
[facility] After Hours Telephone Line  Person calling: Rafan Sanders Relationship to patient: Wife  Chief complaint: Hospice paperwork  Patient's wife states that she has been calling multiple times with regard to her husband receiving hospice care. She states hospice nurse came by and told her that they will need Dr. [name] to sign and return a fax. Patient's wife was very irritated on the phone and states that she feels like no one is helping her. She states she has been trying to get this done since yesterday. Because this is after hours, I discussed that I am not able to retrieve any faxes from the office and, per chart review, I am unsure of what information is needed for hospice. Tried to give alternatives, including for her to call hospice and inquire as to what is needed or to have them call me back directly to possibly receive verbal orders. Also an option for fax to be sent to the hospital. Patient's wife continued to state that she felt like she's being told that she is on her own. I tried to reassure Ms. Marohl that that was not the case. She then tried to record a statement from me implying that I was telling her there was nothing I nor the office could do for her. Again, I reassured her that that was not the case and that I am trying to help her by offering an alternative while after hours. I gave Ms. Hedding our office number for her to try and contact Hospice and forward our number. She seemed to be okay with this plan, but I am unsure if she will follow- through. Through chart review, I could not see hospice referral, however, hospice did come to see patient.  Cordelia Poche, MD PGY-3, [facility] 12/09/2014, 6:01 PM

## 2014-12-09 NOTE — Telephone Encounter (Signed)
Update - copied from phone encounter dated 11/3  I called patient's wife, Ray [name], to discuss this referral.  I had gone through the steps to refer Ray [name] to [facility] and [facility] (and had placed referral documentation in to-fax box earlier today).  Discovered during this call that patient and family have had a very difficult time today. They have been calling our office trying to get set up with [facility]. A representative from [facility] came out to the house yesterday and told them that as soon as our office sends off the referral information to their agency, that they can begin the hospice intake assessment. The family would like to go with this agency. Patient had a fall today. Ray [name] is very frustrated and actually had called the clinic after-hours line earlier and spoke with Dr. Lonny Prude about coordinating the referral. I happened to call her shortly thereafter, without realizing that they had spoken. I apologized for the confusion about which agency they would be going with, and that she has had a hard time getting through to [place].  Ray [name] gave me the contact information for the [employer] representative, Ray [name].  Office number [phone] Toll free number [phone] Cell number [phone]  I called Ray [name] on his cell phone and was able to clarify exactly what needs to happen in order for patient to be evaluated and admitted to hospice. It seems patient is deteriorating rather quickly. Ray [name] was very helpful and provided me with the fax number [phone]) to send the referral to. He stated the agency would be able to do the hospice intake assessment either tomorrow, or Sunday at the latest.  I completed the referral form for [facility] and have faxed all of this information over to them, including patient's latest office visits with me, medical oncology, and radiation oncology, in addition to recent labs, scans, weights, and insurance information. I also  removed [place] referral information from to-fax box.  Updated wife Ray [name] on the phone that I would take care of faxing all of this over tonight. She was very Patent attorney.  This weekend, if Ray [name] or another family member calls with any concerns about Ray [name], please page me or call my cell number (on-call residents will have this available) and I will be happy to assist in answering their questions.  Patient likely has limited prognosis and it is very important we do what we can to help him find comfort and dignity, and to support his family through this process.  Leeanne Rio, MD

## 2014-12-11 ENCOUNTER — Other Ambulatory Visit: Payer: Self-pay | Admitting: Family Medicine

## 2014-12-12 ENCOUNTER — Ambulatory Visit: Payer: Medicare Other

## 2014-12-13 ENCOUNTER — Ambulatory Visit: Payer: Medicare Other

## 2014-12-13 ENCOUNTER — Other Ambulatory Visit: Payer: Self-pay | Admitting: Family Medicine

## 2014-12-13 DIAGNOSIS — C259 Malignant neoplasm of pancreas, unspecified: Secondary | ICD-10-CM

## 2014-12-13 MED ORDER — PROCHLORPERAZINE MALEATE 10 MG PO TABS: 10.0000 mg | ORAL_TABLET | Freq: Four times a day (QID) | ORAL | Status: DC | PRN

## 2014-12-14 ENCOUNTER — Telehealth: Payer: Self-pay | Admitting: Oncology

## 2014-12-14 NOTE — Telephone Encounter (Signed)
Patient wife left massage on vm 11/8 asking that 11/11 appointments with BS be cxd. Per wife no need to return call just cx appointments. Appointments cxd - no return call made - desk nurse informed.

## 2014-12-16 ENCOUNTER — Other Ambulatory Visit: Payer: Medicare Other

## 2014-12-16 ENCOUNTER — Ambulatory Visit: Payer: Medicare Other | Admitting: Oncology

## 2014-12-20 ENCOUNTER — Telehealth: Payer: Self-pay | Admitting: Family Medicine

## 2014-12-20 NOTE — Progress Notes (Signed)
  Radiation Oncology         [phone] ________________________________  Name: Ray [name] MRN: [id number]  Date: 12/05/2014  DOB: [birth year]  End of Treatment Note  Diagnosis:   Pancreatic cancer      Indication for treatment::  curative       Radiation treatment dates:   10/24/2014 through 11/25/2014  Site/dose:   The patient was treated to the abdomen initially using a 3 field 3-D conformal technique. This delivered 45 gray in 25 fractions. The patient was planned to receive a 5.4 gray boost using a cone down 3 field technique to yield a final total dose of 50.4 gray.  Narrative: The patient tolerated radiation reasonably well during much of his treatment. However the patient status declined with extreme fatigue towards the end of treatment. The patient decided to forego his final 3 fractions of radiation. The patient therefore received a total of 45 gray.  Plan: The patient has completed radiation treatment. The patient will return to radiation oncology clinic for routine followup in one month. I advised the patient to call or return sooner if they have any questions or concerns related to their recovery or treatment. ________________________________  Jodelle Gross, M.D., Ph.D.

## 2014-12-20 NOTE — Telephone Encounter (Signed)
Red team, Can you call patient and obtain his verbal permission for me to fax his medical information to his daughter Jamie's employer for her FMLA paperwork?   Patient is on hospice and I would like at least a verbal authorization prior to releasing his medical information.  Thanks, Leeanne Rio, MD

## 2014-12-20 NOTE — Telephone Encounter (Signed)
Spoke with pt and he gave [place] the verbal permission to send his information to daughters employer. Ruthvik Barnaby Kennon Holter, CMA

## 2014-12-22 ENCOUNTER — Telehealth: Payer: Self-pay | Admitting: *Deleted

## 2014-12-22 NOTE — Telephone Encounter (Signed)
FMLA paperwork completed. Will place in [place] RN's office.  Leeanne Rio, MD

## 2014-12-22 NOTE — Telephone Encounter (Signed)
Late entry for 12/16/14: [name] spoke with pt and wife. We will see him when he desires. Pt has enrolled in hospice care.

## 2014-12-26 NOTE — Telephone Encounter (Signed)
FMLA paperwork faxed to [employer] [phone]) and to [employer] [phone]).  Original FMLA paperwork placed in scan box to be scanned into patient's chart.  Burna Forts, BSN, RN-BC

## 2015-01-03 ENCOUNTER — Ambulatory Visit: Payer: Medicare Other | Admitting: Family Medicine

## 2015-01-05 NOTE — Telephone Encounter (Signed)
Pt mother calling and states that the paperwork was not complete, and that [place] employer faxed the forms back with instructions on the cover sheet. Calling to check status. Thank you,Sadie Reynolds, ASA

## 2015-01-05 NOTE — Telephone Encounter (Signed)
New form completed. I called patient's daughter Roselyn Reef to let her know that I have it completed. There is a part she did not fill out that she'll need to write some info in under "Employee Information". She asked that I fax the completed forms back to her so that she can take care of that part and get the forms turned in. She was very Patent attorney. Her personal fax number is [phone].  Will turn in to [place] so that forms can be scanned into chart and faxed to the number above.   Leeanne Rio, MD

## 2015-01-06 NOTE — Telephone Encounter (Signed)
Forms faxed to patient's sister and copied for scanning in patient's record.  Derl Barrow, RN

## 2015-01-11 ENCOUNTER — Telehealth: Payer: Self-pay | Admitting: Family Medicine

## 2015-01-11 ENCOUNTER — Ambulatory Visit: Payer: Medicare Other | Admitting: Podiatry

## 2015-01-11 NOTE — Telephone Encounter (Signed)
pts daugher, Roselyn Reef is requesting a prescription for patient to get hospice care. He is currently in hospice care but is not happy with the company providing the services. Says you can call for her to explain what is going on. Patient will be moving this weekend because his house is being foreclosed on so she does not want to discontinue current services just yet. She wants to get her dad settled into his new home then will contact [facility] [place]. If a prescription can be sent to her she says to fax to [phone].

## 2015-01-13 ENCOUNTER — Encounter (HOSPITAL_COMMUNITY): Payer: Self-pay | Admitting: Radiology

## 2015-01-13 ENCOUNTER — Emergency Department (HOSPITAL_COMMUNITY): Payer: Medicare Other

## 2015-01-13 ENCOUNTER — Inpatient Hospital Stay (HOSPITAL_COMMUNITY)
Admission: EM | Admit: 2015-01-13 | Discharge: 2015-01-18 | DRG: 065 | Disposition: A | Discharge status: Hospice | Payer: Medicare Other | Attending: Family Medicine | Admitting: Family Medicine

## 2015-01-13 DIAGNOSIS — I10 Essential (primary) hypertension: Secondary | ICD-10-CM | POA: Diagnosis present

## 2015-01-13 DIAGNOSIS — R29810 Facial weakness: Secondary | ICD-10-CM | POA: Diagnosis present

## 2015-01-13 DIAGNOSIS — I63311 Cerebral infarction due to thrombosis of right middle cerebral artery: Secondary | ICD-10-CM | POA: Diagnosis present

## 2015-01-13 DIAGNOSIS — D649 Anemia, unspecified: Secondary | ICD-10-CM | POA: Diagnosis present

## 2015-01-13 DIAGNOSIS — Z79818 Long term (current) use of other agents affecting estrogen receptors and estrogen levels: Secondary | ICD-10-CM

## 2015-01-13 DIAGNOSIS — E785 Hyperlipidemia, unspecified: Secondary | ICD-10-CM | POA: Diagnosis present

## 2015-01-13 DIAGNOSIS — F17290 Nicotine dependence, other tobacco product, uncomplicated: Secondary | ICD-10-CM | POA: Diagnosis present

## 2015-01-13 DIAGNOSIS — C801 Malignant (primary) neoplasm, unspecified: Secondary | ICD-10-CM | POA: Diagnosis present

## 2015-01-13 DIAGNOSIS — Z86718 Personal history of other venous thrombosis and embolism: Secondary | ICD-10-CM | POA: Diagnosis not present

## 2015-01-13 DIAGNOSIS — F419 Anxiety disorder, unspecified: Secondary | ICD-10-CM | POA: Diagnosis present

## 2015-01-13 DIAGNOSIS — C257 Malignant neoplasm of other parts of pancreas: Secondary | ICD-10-CM

## 2015-01-13 DIAGNOSIS — R531 Weakness: Secondary | ICD-10-CM | POA: Insufficient documentation

## 2015-01-13 DIAGNOSIS — C259 Malignant neoplasm of pancreas, unspecified: Secondary | ICD-10-CM | POA: Diagnosis not present

## 2015-01-13 DIAGNOSIS — I639 Cerebral infarction, unspecified: Secondary | ICD-10-CM | POA: Diagnosis present

## 2015-01-13 DIAGNOSIS — Z9221 Personal history of antineoplastic chemotherapy: Secondary | ICD-10-CM

## 2015-01-13 DIAGNOSIS — D696 Thrombocytopenia, unspecified: Secondary | ICD-10-CM | POA: Diagnosis present

## 2015-01-13 DIAGNOSIS — E876 Hypokalemia: Secondary | ICD-10-CM | POA: Diagnosis present

## 2015-01-13 DIAGNOSIS — R109 Unspecified abdominal pain: Secondary | ICD-10-CM

## 2015-01-13 DIAGNOSIS — D6869 Other thrombophilia: Secondary | ICD-10-CM | POA: Diagnosis present

## 2015-01-13 DIAGNOSIS — Z515 Encounter for palliative care: Secondary | ICD-10-CM | POA: Insufficient documentation

## 2015-01-13 DIAGNOSIS — K219 Gastro-esophageal reflux disease without esophagitis: Secondary | ICD-10-CM | POA: Diagnosis present

## 2015-01-13 DIAGNOSIS — I6320 Cerebral infarction due to unspecified occlusion or stenosis of unspecified precerebral arteries: Secondary | ICD-10-CM

## 2015-01-13 DIAGNOSIS — R64 Cachexia: Secondary | ICD-10-CM | POA: Diagnosis present

## 2015-01-13 DIAGNOSIS — R627 Adult failure to thrive: Secondary | ICD-10-CM | POA: Diagnosis present

## 2015-01-13 DIAGNOSIS — Z7901 Long term (current) use of anticoagulants: Secondary | ICD-10-CM

## 2015-01-13 DIAGNOSIS — F329 Major depressive disorder, single episode, unspecified: Secondary | ICD-10-CM | POA: Diagnosis present

## 2015-01-13 DIAGNOSIS — I63319 Cerebral infarction due to thrombosis of unspecified middle cerebral artery: Secondary | ICD-10-CM

## 2015-01-13 DIAGNOSIS — Z681 Body mass index (BMI) 19 or less, adult: Secondary | ICD-10-CM | POA: Diagnosis not present

## 2015-01-13 DIAGNOSIS — M6289 Other specified disorders of muscle: Secondary | ICD-10-CM | POA: Diagnosis not present

## 2015-01-13 DIAGNOSIS — R29713 NIHSS score 13: Secondary | ICD-10-CM | POA: Diagnosis present

## 2015-01-13 DIAGNOSIS — R471 Dysarthria and anarthria: Secondary | ICD-10-CM | POA: Diagnosis present

## 2015-01-13 DIAGNOSIS — G893 Neoplasm related pain (acute) (chronic): Secondary | ICD-10-CM | POA: Diagnosis present

## 2015-01-13 DIAGNOSIS — Z923 Personal history of irradiation: Secondary | ICD-10-CM | POA: Diagnosis not present

## 2015-01-13 DIAGNOSIS — Z66 Do not resuscitate: Secondary | ICD-10-CM | POA: Diagnosis present

## 2015-01-13 DIAGNOSIS — C250 Malignant neoplasm of head of pancreas: Secondary | ICD-10-CM | POA: Diagnosis not present

## 2015-01-13 DIAGNOSIS — G8104 Flaccid hemiplegia affecting left nondominant side: Secondary | ICD-10-CM | POA: Diagnosis present

## 2015-01-13 DIAGNOSIS — M109 Gout, unspecified: Secondary | ICD-10-CM | POA: Diagnosis present

## 2015-01-13 DIAGNOSIS — R1310 Dysphagia, unspecified: Secondary | ICD-10-CM | POA: Diagnosis present

## 2015-01-13 LAB — COMPREHENSIVE METABOLIC PANEL
ALT: 15 U/L — ABNORMAL LOW (ref 17–63)
ANION GAP: 9 (ref 5–15)
AST: 26 U/L (ref 15–41)
Albumin: 3.1 g/dL — ABNORMAL LOW (ref 3.5–5.0)
Alkaline Phosphatase: 261 U/L — ABNORMAL HIGH (ref 38–126)
BUN: 6 mg/dL (ref 6–20)
CHLORIDE: 99 mmol/L — AB (ref 101–111)
CO2: 28 mmol/L (ref 22–32)
Calcium: 8.4 mg/dL — ABNORMAL LOW (ref 8.9–10.3)
Creatinine, Ser: 0.98 mg/dL (ref 0.61–1.24)
Glucose, Bld: 95 mg/dL (ref 65–99)
POTASSIUM: 3.6 mmol/L (ref 3.5–5.1)
Sodium: 136 mmol/L (ref 135–145)
Total Bilirubin: 1.9 mg/dL — ABNORMAL HIGH (ref 0.3–1.2)
Total Protein: 5.7 g/dL — ABNORMAL LOW (ref 6.5–8.1)

## 2015-01-13 LAB — I-STAT CHEM 8, ED
BUN: 7 mg/dL (ref 6–20)
Calcium, Ion: 1.08 mmol/L — ABNORMAL LOW (ref 1.13–1.30)
Chloride: 95 mmol/L — ABNORMAL LOW (ref 101–111)
Creatinine, Ser: 0.9 mg/dL (ref 0.61–1.24)
GLUCOSE: 88 mg/dL (ref 65–99)
HEMATOCRIT: 35 % — AB (ref 39.0–52.0)
HEMOGLOBIN: 11.9 g/dL — AB (ref 13.0–17.0)
POTASSIUM: 3.4 mmol/L — AB (ref 3.5–5.1)
Sodium: 136 mmol/L (ref 135–145)
TCO2: 29 mmol/L (ref 0–100)

## 2015-01-13 LAB — DIFFERENTIAL
BASOS ABS: 0 10*3/uL (ref 0.0–0.1)
BASOS PCT: 0 %
EOS ABS: 0.1 10*3/uL (ref 0.0–0.7)
Eosinophils Relative: 1 %
Lymphocytes Relative: 12 %
Lymphs Abs: 1.3 10*3/uL (ref 0.7–4.0)
MONOS PCT: 11 %
Monocytes Absolute: 1.2 10*3/uL — ABNORMAL HIGH (ref 0.1–1.0)
Neutro Abs: 8.2 10*3/uL — ABNORMAL HIGH (ref 1.7–7.7)
Neutrophils Relative %: 76 %

## 2015-01-13 LAB — GLUCOSE, CAPILLARY: GLUCOSE-CAPILLARY: 81 mg/dL (ref 65–99)

## 2015-01-13 LAB — I-STAT TROPONIN, ED: TROPONIN I, POC: 0.65 ng/mL — AB (ref 0.00–0.08)

## 2015-01-13 LAB — CBC
HEMATOCRIT: 31.1 % — AB (ref 39.0–52.0)
Hemoglobin: 10.4 g/dL — ABNORMAL LOW (ref 13.0–17.0)
MCH: 29 pg (ref 26.0–34.0)
MCHC: 33.4 g/dL (ref 30.0–36.0)
MCV: 86.6 fL (ref 78.0–100.0)
Platelets: 51 10*3/uL — ABNORMAL LOW (ref 150–400)
RBC: 3.59 MIL/uL — ABNORMAL LOW (ref 4.22–5.81)
RDW: 16.7 % — AB (ref 11.5–15.5)
WBC: 10.9 10*3/uL — ABNORMAL HIGH (ref 4.0–10.5)

## 2015-01-13 LAB — ETHANOL

## 2015-01-13 LAB — APTT: APTT: 29 s (ref 24–37)

## 2015-01-13 LAB — CBG MONITORING, ED: GLUCOSE-CAPILLARY: 81 mg/dL (ref 65–99)

## 2015-01-13 LAB — PROTIME-INR
INR: 1.4 (ref 0.00–1.49)
Prothrombin Time: 17.2 seconds — ABNORMAL HIGH (ref 11.6–15.2)

## 2015-01-13 MED ORDER — SODIUM CHLORIDE 0.9 % IV SOLN
INTRAVENOUS | Status: DC
  Administered 2015-01-13 – 2015-01-18 (×8): via INTRAVENOUS
  Filled 2015-01-13: qty 1000

## 2015-01-13 MED ORDER — STROKE: EARLY STAGES OF RECOVERY BOOK
Freq: Once | Status: AC
  Administered 2015-01-13: 1
  Filled 2015-01-13: qty 1

## 2015-01-13 MED ORDER — ASPIRIN 81 MG PO CHEW: 324.0000 mg | CHEWABLE_TABLET | Freq: Once | ORAL | Status: DC

## 2015-01-13 MED ORDER — PANTOPRAZOLE SODIUM 40 MG IV SOLR
40.0000 mg | Freq: Every day | INTRAVENOUS | Status: DC
  Administered 2015-01-13 – 2015-01-16 (×4): 40 mg via INTRAVENOUS
  Filled 2015-01-13 (×4): qty 40

## 2015-01-13 MED ORDER — MORPHINE SULFATE (PF) 4 MG/ML IV SOLN
4.0000 mg | INTRAVENOUS | Status: DC | PRN
  Administered 2015-01-14 – 2015-01-17 (×2): 4 mg via INTRAVENOUS
  Filled 2015-01-13 (×5): qty 1

## 2015-01-13 MED ORDER — DULOXETINE HCL 60 MG PO CPEP
60.0000 mg | ORAL_CAPSULE | Freq: Every day | ORAL | Status: DC
  Administered 2015-01-15 – 2015-01-18 (×4): 60 mg via ORAL
  Filled 2015-01-13 (×5): qty 1

## 2015-01-13 MED ORDER — ASPIRIN 300 MG RE SUPP
300.0000 mg | Freq: Once | RECTAL | Status: AC
  Administered 2015-01-13: 300 mg via RECTAL
  Filled 2015-01-13: qty 1

## 2015-01-13 MED ORDER — ASPIRIN 325 MG PO TABS
325.0000 mg | ORAL_TABLET | Freq: Every day | ORAL | Status: DC
  Administered 2015-01-15 – 2015-01-18 (×4): 325 mg via ORAL
  Filled 2015-01-13 (×5): qty 1

## 2015-01-13 MED ORDER — MORPHINE SULFATE (PF) 4 MG/ML IV SOLN
4.0000 mg | Freq: Once | INTRAVENOUS | Status: AC
  Administered 2015-01-13: 4 mg via INTRAVENOUS
  Filled 2015-01-13: qty 1

## 2015-01-13 MED ORDER — IOHEXOL 350 MG/ML SOLN
90.0000 mL | Freq: Once | INTRAVENOUS | Status: AC | PRN
  Administered 2015-01-13: 90 mL via INTRAVENOUS

## 2015-01-13 NOTE — Progress Notes (Signed)
Patient arrived from ED left side extremities are flaccid with facial droop, failed swallow in ED he is alert and was a palitive patient on high dose of pain medication but has no current complaint of pain.

## 2015-01-13 NOTE — ED Notes (Signed)
Attempted report 

## 2015-01-13 NOTE — ED Notes (Signed)
MD at bedside. 

## 2015-01-13 NOTE — Code Documentation (Signed)
59yo male arriving to [facility] via [place] at [phone].  EMS reports that patient woke up at his baseline and was LKW at [place].  Patient's wife later noticed him to have facial weakness and that he was not moving his left side.  EMS called and activated a Code Stroke for left facial droop and left sided paralysis.  Patient with h/o splenic thrombosis on Xarelto, last dose yesterday, and pancreatic cancer diagnosis with home hospice care.  Stroke team at the bedside on patient arrival.  Labs drawn and patient cleared for CT by Dr. Lita Mains.  Patient to CT.  Patient is contraindicated for tPA d/t taking Xarelto.  18G PIV placed in the [facility].  CT head completed followed by CTA head and neck and CTP per Dr. Leonel Ramsay.  Patient tolerated well.  Patient back to room.  NIHSS 13, see documentation for details and code stroke times.  Patient with left facial droop, left hemiparesis with only slight gross motor movement and mild dysarthria.  Dr. Leonel Ramsay speaking with family on plan of care.  No acute stroke treatment at this time.  Patient to be admitted.  Bedside handoff with ED RNs [place] and [place].

## 2015-01-13 NOTE — Consult Note (Signed)
Neurology Consultation Reason for Consult: stroke Referring Physician: Alfonse Spruce, E  CC: Left sided weakness  History is obtained from: Patient  HPI: Ray [name] is a 59 y.o. male with a history of pancreatic cancer who presents with left sided weakness that started after 7:15 am. He was last seen well at that time and had been up this morning. At baseline, he has to use a walker and is requiring some help throughout the day, but is able to still do most of his ADLs.   He was seen to be slumped over later, but did not recognize that he had deficits. He was transported to the ER where he was found to have a large left MCA occlusion.   I discussed IR intervention with his wife, and after discussion with Dr. Estanislado Pandy did offer this therapy, however, given the patient's recent decline, and aggressiveness of the procedure, this therapy was not pursued.   LKW: 7:15 am.  tpa given?: no, on xarelto.     ROS: A 14 point ROS was performed and is negative except as noted in the HPI.   Past Medical History  Diagnosis Date  . Blood transfusion   . GERD (gastroesophageal reflux disease)   . Hyperlipidemia   . Hypertension   . H/O hiatal hernia   . Arthritis     in his back, hips, knees, R shoulder   . Colon polyps     TUBULAR ADENOMA  . Splenic vein thrombosis   . Liver cyst   . Hx of pancreatitis   . Gout   . Pancreatic cancer ([place]) 07/06/13    adenocarcinoma  . Blood transfusion without reported diagnosis      Family History  Problem Relation Age of Onset  . Colon cancer Neg Hx   . Esophageal cancer Neg Hx   . Stomach cancer Neg Hx   . Rectal cancer Neg Hx      Social History:  reports that he has been smoking Cigars.  He has never used smokeless tobacco. He reports that he does not drink alcohol or use illicit drugs.   Exam: Current vital signs: BP 134/85 mmHg  Pulse 77  Temp(Src) 98.6 F (37 C)  Resp 15  Ht 6' (1.829 m)  Wt 60.192 kg (132 lb 11.2 oz)  BMI 17.99  kg/m2  SpO2 99% Vital signs in last 24 hours: Temp:  [98.6 F (37 C)] 98.6 F (37 C) (12/09 0910) Pulse Rate:  [77-83] 77 (12/09 0930) Resp:  [15-22] 15 (12/09 0930) BP: (134-147)/(85-93) 134/85 mmHg (12/09 0930) SpO2:  [97 %-99 %] 99 % (12/09 0930) Weight:  [60.192 kg (132 lb 11.2 oz)] 60.192 kg (132 lb 11.2 oz) (12/09 0910)   Physical Exam  Constitutional: Appears thin Psych: Affect appropriate to situation Eyes: No scleral injection HENT: No OP obstrucion Head: Normocephalic.  Cardiovascular: Normal rate and regular rhythm.  Respiratory: Effort normal  GI: Soft.  No distension. There is no tenderness.  Skin: WDI  Neuro: Mental Status: Patient is awake, alert, interactive and appropriate Patient is able to give a clear and coherent history. No signs of aphasia  He has agnosia to his deficits. He does not extinguish Cranial Nerves: II: Visual Fields are full. Pupils are equal, round, and reactive to light.   III,IV, VI: right gaze preference but does cross midline.  V: Facial sensation is decreased on left VII: Facial movement is decreased on left.  VIII: hearing is intact to voice Motor: Tone is normal. Bulk  is normal. 5/5 strength was present on the right, he has a dense flaccid paralysis throughout the left side.  Sensory: Sensation is symmetric to light touch and temperature in the arms and legs. Cerebellar: No ataxia on right.   I have reviewed labs in epic and the results pertinent to this consultation are: Cr 0.9  I have reviewed the images obtained: CT angio and perfusion - penumbra with left MCA occlusion  Impression: 59 yo M with left MCA occlusion. He is not a tpa candidate and given his underlying medical condition he will not be pursuing IA therapy. Given that he is on hospice I do not think that a full workup is needed, this is likely related to hypercoagulability given his history of splenic infarction as well.   Recommendations: 1) I would hold  anticoagulants for now, though risk benefit may need to be reconsidered given that he is forming clot even with anticoagulation.  2) Stroke team to follow.  3) Palliative care consultation.    Roland Rack, MD [facility] [phone]  If 7pm- 7am, please page neurology on call as listed in [place].

## 2015-01-13 NOTE — H&P (Signed)
[facility] Admission History and Physical Service Pager: [phone]  Patient name: Ray [name] Medical record number: [id number] Date of birth: 11-29-1955 Age: 59 y.o. Gender: male  Primary Care Provider: Chrisandra Netters, MD Consultants: Neurology Code Status: DNAR  Chief Complaint: Left sided weakness  Assessment and Plan: Ray [name] is a 59 y.o. male presenting with left sided weakness since approximately 7:15 this AM, consistent with stroke . 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 Stroke is in the setting of Xarelto use. A long discussion was held with the family after they had been seen by neurology. The decision had already been made to not start aggressive management given he is DNR. He does not wish to have any further procedures given he is DNR making further testing to ellucidate the cause of his stroke unneeded. His cancer is a strong risk factor for thrombosis. 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   - Admit to med surg - Per discussion with Neurology, will order MRI brain to establish extent of stroke, however will not pursue other stroke work up, including echocardiogram or carotid dopplers given he is DNR and does not desire further intervention - NPO until bedside swallow study or speech eval if fails swallow study - telemetry for 24 hours per stroke order set - PT/OT/SLP - Palliative care consult - SW for consideration of discharge planning - aspirin 325, will hold Xarelto per neuro and will follow up for how long it should be held - neurology following, appreciate recs  Pancreatic Cancer- Diagnosed in 05/2013, s/p chemotherapy and radiation with completion of radiation on 11/25/2014. Is oncologist is Dr [name]. He has been made DNR and is receiving home hospice care. He does have significant  chronic cancer related pain for which he takes MSIR and MS contin - As he is now NPO until swallow study, will start morphine IV 4 mg q2hr and titrate as needed - if passes swallow study will start home narcotic regimen - will restart PO bowel regimen when able to tolerate PO - Given new stroke and need for management of these new symptoms on top of his current cancer related symptoms, will consult palliative care as well as SW for inpatient comfort care management as well as discharge planning  Failure to thrive- He has had increasing fatigue and loss of appetite - When able to take PO, consider nutrition consult, supplements as tolerated. He has in past not liked ensure - restart megace when taking PO  Depression- Stable - Will continue home cymbalta 60 mg qD - consider starting ativan as needed. He has taken it in past prior to procedures  Hypertension- Stable without antihypertensives - will follow closely and treated as needed  Thrombocytopenia- Plt ct 51, he has had chemoradiation recently with most recent radiation therapy on 10/21 - Will follow plt ct  Elevated troponin- Troponin .25, EKG computer read states there is sign of ischemia, but after speaking with the ED attending and going over the EKG we could identify no sign of ischemia. His EKG was again repeated with no sign of acute ischemia. He has no symptoms of ACS - consider trend troponin ACS rule out if clinical evidence of cardiac ischemia  Hypokalemia- K 3.4 - Will follow for now and repeat CMP in AM  FEN/GI: NPO/MIVF 100 cc.hr NS, protonix IV while NPO, restart oral  as tolerated Prophylaxis: SCDs  Disposition: Admit, Attending Dr [name]  History of Present Illness: History is obtained from patient, wife and daughters    Ray [name] is a 59 y.o. male presenting with left sided weakness that started this AM at approx 7:15 AM  When he woke, per patient, he was at his baseline. However, at approximately 7:15AM   his daughter went to ask what he wanted for breakfast and noted he was slurring his speech. At that time she thought he was still sleepy and did not think much of it. His wife then went give him his breakfast and noted that he was still slurring his speech and the left side of his face was drooping. She then called EMS. Significantly he has been reporting a mild headache for the last two days treated with tylenol in addition to his chronic pain management. He reportedly has been taking his Xarelto as prescribed. He denies any dizziness, lightheadedness, chest pain, palpitations, SOB. His family deny any confusion or change in mental status associated with his symptoms.  Significantly he has pancreatic cancer and is s/p chemoradiation therapy. He is now on hospice care. His wife feels he has been more tired over the last week and has continued to not eat very much. He does have significant abdominal pain treated with morphine  In the ED he was evaluated by neurology and discussion was held with the patient and his family regarding their desire for aggressive therapy as well as the limited nature of what that would be given he has been taking Xarelto. They decided not to pursue aggressive therapy with TPA or IR procedure   Review Of Systems: Per HPI with the following additions: denies recent illness, fevers/chills, N/V/D, chest pain or SOB Otherwise the remainder of the systems were negative.  Patient Active Problem List   Diagnosis Date Noted  . CVA (cerebral infarction) 01/13/2015  . Cerebral infarction due to unspecified mechanism   . Left-sided weakness   . Malignant neoplasm of pancreas ([place])   . Goals of care, counseling/discussion 12/06/2014  . Palliative care encounter 04/19/2014  . Cancer associated pain 04/19/2014  . Cancer related pain 04/05/2014  . Depression 11/26/2013  . Hx of pancreatitis 11/05/2013  . Protein-calorie malnutrition, severe ([place]) 08/25/2013  . Intractable  abdominal pain 08/03/2013  . Dehydration 08/03/2013  . Abdominal discomfort 08/03/2013  . Malignant neoplasm of head of pancreas ([place]) 07/30/2013  . Hematuria 06/01/2013  . Anticoagulated 06/01/2013  . 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 Unspecified constipation 04/21/2013  . GERD (gastroesophageal reflux disease) 04/21/2013  . Chronic postoperative pain 04/14/2012  . Spinal stenosis in cervical region 04/14/2012  . Chronic left sacroiliac joint pain 04/14/2012  . Right lumbar radiculitis 04/14/2012  . Abnormal MRI, lumbar spine 02/17/2012  . Low back pain 12/27/2011  . 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 Overweight(278.02) 01/21/2011  . Right leg pain 10/21/2010  . HYPERGLYCEMIA, FASTING 01/22/2010  . UNSPECIFIED ANEMIA 07/05/2008  . HIP FRACTURE, RIGHT 07/05/2008  . HEMARTHROSIS 11/11/2007  . GANGLION, TENDON SHEATH 09/30/2006  . Gout 04/03/2006  . Hypertension 04/03/2006  . RAYNAUDS SYNDROME 04/03/2006  . REFLUX ESOPHAGITIS 04/03/2006    Past Medical History: Past Medical History  Diagnosis Date  . Blood transfusion   . GERD (gastroesophageal reflux disease)   . Hyperlipidemia   . Hypertension   . H/O hiatal hernia   . Arthritis     in his back, hips, knees, R shoulder   .  Colon polyps     TUBULAR ADENOMA  . Splenic vein thrombosis   . Liver cyst   . Hx of pancreatitis   . Gout   . Pancreatic cancer ([place]) 07/06/13    adenocarcinoma  . Blood transfusion without reported diagnosis     Past Surgical History: Past Surgical History  Procedure Laterality Date  . Hip surgery      x2 right hip- retained hardware ambulates with cane  . Shoulder surgery      right  . Hand surgery      left thumb tendon repair  . Femur hardware removal  2011    R femur- prothesis removed & replaced  . Posterior cervical fusion/foraminotomy N/A 04/20/2012    Procedure: POSTERIOR CERVICAL FUSION/FORAMINOTOMY LEVEL 1;  Surgeon: Ophelia Charter, MD;  Location:  [facility];  Service: Neurosurgery;  Laterality: N/A;  C1/2 laminectomies with posterior screw fixation  . Colonoscopy w/ biopsies  04/15/2011  . Eus N/A 06/10/2013    Procedure: UPPER ENDOSCOPIC ULTRASOUND (EUS) LINEAR;  Surgeon: Milus Banister, MD;  Location: [facility];  Service: Endoscopy;  Laterality: N/A;  . Eus Bilateral 07/15/2013    Procedure: ESOPHAGEAL ENDOSCOPIC ULTRASOUND (EUS) RADIAL;  Surgeon: Milus Banister, MD;  Location: [facility];  Service: Endoscopy;  Laterality: Bilateral;  . Eus N/A 08/05/2013    Procedure: ESOPHAGEAL ENDOSCOPIC ULTRASOUND (EUS) RADIAL;  Surgeon: Milus Banister, MD;  Location: [facility];  Service: Endoscopy;  Laterality: N/A;  . Neurolytic celiac plexus N/A 08/05/2013    Procedure: NEUROLYTIC CELIAC PLEXUS;  Surgeon: Milus Banister, MD;  Location: [facility];  Service: Endoscopy;  Laterality: N/A;  . Eus N/A 03/31/2014    Procedure: UPPER ENDOSCOPIC ULTRASOUND (EUS) LINEAR;  Surgeon: Milus Banister, MD;  Location: [facility];  Service: Endoscopy;  Laterality: N/A;   celiac plexus neurolysis  . Neurolytic celiac plexus N/A 03/31/2014    Procedure: NEUROLYTIC CELIAC PLEXUS;  Surgeon: Milus Banister, MD;  Location: [facility];  Service: Endoscopy;  Laterality: N/A;    Social History: Social History  Substance Use Topics  . Smoking status: Current Every Day Smoker    Types: Cigars  . Smokeless tobacco: Never Used     Comment: one a day cigar x 6 months. Quit cigarettes -20 yrs ago.  . Alcohol Use: No     Comment: Quit heavy use 30 yrs ago.none now   Additional social history: Is getting hospice care for pancreatic cancer, lives with wife, smokes 1 cigar per day, denies etoh, drug use Please also refer to relevant sections of EMR.  Family History: Family History  Problem Relation Age of Onset  . Colon cancer Neg Hx   . Esophageal cancer Neg Hx   . Stomach cancer Neg Hx   . Rectal cancer Neg Hx     Allergies and Medications: No Known  Allergies No current facility-administered medications on file prior to encounter.   Current Outpatient Prescriptions on File Prior to Encounter  Medication Sig Dispense Refill  . capecitabine (XELODA) 500 MG tablet Take 3 tablets (1,500 mg total) by mouth 2 (two) times daily after a meal. Take on days of radiation therapy only (Monday-Friday) 48 tablet 0  . DULoxetine (CYMBALTA) 60 MG capsule Take 1 capsule (60 mg total) by mouth daily. TAKE 1 CAPSULE DAILY 90 capsule 0  . hyaluronate sodium (RADIAPLEXRX) GEL Apply 1 application topically daily.    Marland Kitchen lidocaine-prilocaine (EMLA) cream Apply over port area 1-2 hours prior to  treatment and cover with plastic wrap.  DO NOT RUB IN. 30 g 3  . loperamide (IMODIUM) 2 MG capsule Take 1 capsule (2 mg total) by mouth as needed for diarrhea or loose stools. Maximum 8 tabs per day (Patient not taking: Reported on 11/04/2014) 30 capsule 2  . LORazepam (ATIVAN) 1 MG tablet Take 1 tablet (1 mg) one hour prior to radiation 15 tablet 0  . megestrol (MEGACE ORAL) 40 MG/ML suspension Take 5 mLs (200 mg total) by mouth 2 (two) times daily. (Patient not taking: Reported on 11/18/2014) 240 mL 1  . morphine (MS CONTIN) 30 MG 12 hr tablet Take 3 tablets (90 mg total) by mouth every 8 (eight) hours. 270 tablet 0  . morphine (MSIR) 30 MG tablet TAKE 1 TABLET BY MOUTH EVERY 4 HOURS AS NEEDED FOR MODERATE PAIN (Patient not taking: Reported on 11/18/2014) 75 tablet 0  . omeprazole (PRILOSEC) 40 MG capsule Take 1 capsule (40 mg total) by mouth daily. 90 capsule 3  . Polyethyl Glycol-Propyl Glycol (SYSTANE) 0.4-0.3 % SOLN Apply 1 drop to eye 3 (three) times daily as needed (dry eyes).    . potassium chloride SA (KLOR-CON M20) 20 MEQ tablet Take 1 tablet (20 mEq total) by mouth daily. 90 tablet 1  . prochlorperazine (COMPAZINE) 10 MG tablet Take 1 tablet (10 mg total) by mouth every 6 (six) hours as needed for nausea or vomiting. 60 tablet 3  . rivaroxaban (XARELTO) 20 MG TABS  tablet Take 1 tablet (20 mg total) by mouth every morning. 90 tablet 1  . sorbitol 70 % SOLN Take 15 ml - 30 ml by mouth 2 (two) times a days as needed for constipation (Patient not taking: Reported on 11/04/2014) 1000 mL 1  . VIAGRA 50 MG tablet TAKE 1 TABLET AS NEEDED 18 tablet 0    Objective: BP 147/94 mmHg  Pulse 72  Temp(Src) 98.6 F (37 C)  Resp 10  Ht 6' (1.829 m)  Wt 132 lb 11.2 oz (60.192 kg)  BMI 17.99 kg/m2  SpO2 96% Exam: General: NAD, cachectic, lying comfortably in bed HEENT: PERRL, normal oropharynx, MMM, Cardiovascular: RRR, no murmurs auscultated Respiratory: CTAB, normal WOB Abdomen: soft, non tender, + BS MSK: decreased muscle bulk diffusely Skin: diffuse dry skin with flaking Psych: normal mood and affect  Cranial Nerves II - XII - II - Visual field intact III, IV, VI - Extraocular movements intact. V - Facial sensation intact bilaterally. VII - able to raise eyebrows, and close eyelids bilaterally, unable to raise mouth in smile on left side, right smile normal VII- hearing intact to voice XI - Chin turning intact bilaterally, unable to shrug left shoulder XII - Tongue deviation to the left  Motor Strength - The patient's strength was 5/5 on right upper and lower extremities, flaccid paralysis on left upper and lower extremities, Decreased muscle bulk, no fasciculations   Sensory - Light touch, temperature/pinprick were assessed and were symmetrical.   Gait and Station - deferred due to safety concerns.   Labs and Imaging: CBC BMET   Recent Labs Lab 01/13/15 0834 01/13/15 0841  WBC 10.9*  --   HGB 10.4* 11.9*  HCT 31.1* 35.0*  PLT 51*  --     Recent Labs Lab 01/13/15 0834 01/13/15 0841  NA 136 136  K 3.6 3.4*  CL 99* 95*  CO2 28  --   BUN 6 7  CREATININE 0.98 0.90  GLUCOSE 95 88  CALCIUM 8.4*  --  CT Cerebral Perfusion 01/13/2015  Abnormal CT perfusion scan consistent with a large ischemic penumbra in this patient with a  proximal RIGHT MCA M1 large vessel occlusion.  Veatrice Bourbon, MD 01/13/2015, 11:51 AM PGY-2, [place] [facility] pager: [phone], text pages welcome

## 2015-01-13 NOTE — ED Notes (Signed)
Minor Duval (daughter)   [phone]

## 2015-01-13 NOTE — ED Provider Notes (Signed)
CSN: [id number]     Arrival date & time    History   First MD Initiated Contact with Patient 01/13/15 0836     Chief Complaint  Patient presents with  . Code Stroke    '@EDPCLEARED'$ @ (Consider location/radiation/quality/duration/timing/severity/associated sxs/prior Treatment) HPI Comments: 59 year old make with history of pancreatic CA on hospice, GERD, hyperlipidemia presents for leftsided weakness. The patient was fine this morning when he first woke up but around 7:15 AM it was noticed by his family member that he had left facial droop and then the patient developed left arm and leg weakness.  The patient had a mild headache last night.  Denies symptoms like this before.  No nausea, vomiting, diarrhea.     Past Medical History  Diagnosis Date  . Blood transfusion   . GERD (gastroesophageal reflux disease)   . Hyperlipidemia   . Hypertension   . H/O hiatal hernia   . Arthritis     in his back, hips, knees, R shoulder   . Colon polyps     TUBULAR ADENOMA  . Splenic vein thrombosis   . Liver cyst   . Hx of pancreatitis   . Gout   . 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 Blood transfusion without reported diagnosis    Past Surgical History  Procedure Laterality Date  . Hip surgery      x2 right hip- retained hardware ambulates with cane  . Shoulder surgery      right  . Hand surgery      left thumb tendon repair  . Femur hardware removal  2011    R femur- prothesis removed & replaced  . Posterior cervical fusion/foraminotomy N/A 04/20/2012    Procedure: POSTERIOR CERVICAL FUSION/FORAMINOTOMY LEVEL 1;  Surgeon: Ophelia Charter, MD;  Location: [facility];  Service: Neurosurgery;  Laterality: N/A;  C1/2 laminectomies with posterior screw fixation  . Colonoscopy w/ biopsies  04/15/2011  . Eus N/A 06/10/2013    Procedure: UPPER ENDOSCOPIC ULTRASOUND (EUS) LINEAR;  Surgeon: Milus Banister, MD;  Location: [facility];  Service: Endoscopy;  Laterality: N/A;  . Eus  Bilateral 07/15/2013    Procedure: ESOPHAGEAL ENDOSCOPIC ULTRASOUND (EUS) RADIAL;  Surgeon: Milus Banister, MD;  Location: [facility];  Service: Endoscopy;  Laterality: Bilateral;  . Eus N/A 08/05/2013    Procedure: ESOPHAGEAL ENDOSCOPIC ULTRASOUND (EUS) RADIAL;  Surgeon: Milus Banister, MD;  Location: [facility];  Service: Endoscopy;  Laterality: N/A;  . Neurolytic celiac plexus N/A 08/05/2013    Procedure: NEUROLYTIC CELIAC PLEXUS;  Surgeon: Milus Banister, MD;  Location: [facility];  Service: Endoscopy;  Laterality: N/A;  . Eus N/A 03/31/2014    Procedure: UPPER ENDOSCOPIC ULTRASOUND (EUS) LINEAR;  Surgeon: Milus Banister, MD;  Location: [facility];  Service: Endoscopy;  Laterality: N/A;   celiac plexus neurolysis  . Neurolytic celiac plexus N/A 03/31/2014    Procedure: NEUROLYTIC CELIAC PLEXUS;  Surgeon: Milus Banister, MD;  Location: [facility];  Service: Endoscopy;  Laterality: N/A;   Family History  Problem Relation Age of Onset  . Colon cancer Neg Hx   . Esophageal cancer Neg Hx   . Stomach cancer Neg Hx   . Rectal cancer Neg Hx    Social History  Substance Use Topics  . Smoking status: Current Every Day Smoker    Types: Cigars  . Smokeless tobacco: Never Used     Comment: one a day cigar x 6 months. Quit cigarettes -  20 yrs ago.  . Alcohol Use: No     Comment: Quit heavy use 30 yrs ago.none now    Review of Systems  Constitutional: Negative for fever, chills and fatigue.  HENT: Negative for congestion, postnasal drip and rhinorrhea.   Respiratory: Negative for cough, chest tightness and shortness of breath.   Cardiovascular: Negative for chest pain and palpitations.  Gastrointestinal: Negative for nausea, vomiting, abdominal pain and diarrhea.  Genitourinary: Negative for dysuria, urgency and hematuria.  Musculoskeletal: Negative for myalgias and back pain.  Skin: Negative for rash.  Neurological: Positive for weakness (left sided), numbness and headaches.  Negative for seizures and syncope.      Allergies  Review of patient's allergies indicates no known allergies.  Home Medications   Prior to Admission medications   Medication Sig Start Date End Date Taking? Authorizing Provider  capecitabine (XELODA) 500 MG tablet Take 3 tablets (1,500 mg total) by mouth 2 (two) times daily after a meal. Take on days of radiation therapy only (Monday-Friday) 11/15/14   Ladell Pier, MD  DULoxetine (CYMBALTA) 60 MG capsule Take 1 capsule (60 mg total) by mouth daily. TAKE 1 CAPSULE DAILY 12/06/14   Leeanne Rio, MD  hyaluronate sodium (RADIAPLEXRX) GEL Apply 1 application topically daily. 10/17/14   Kyung Rudd, MD  lidocaine-prilocaine (EMLA) cream Apply over port area 1-2 hours prior to treatment and cover with plastic wrap.  DO NOT RUB IN. 04/18/14   Ladell Pier, MD  loperamide (IMODIUM) 2 MG capsule Take 1 capsule (2 mg total) by mouth as needed for diarrhea or loose stools. Maximum 8 tabs per day Patient not taking: Reported on 11/04/2014 01/03/14   Owens Shark, NP  LORazepam (ATIVAN) 1 MG tablet Take 1 tablet (1 mg) one hour prior to radiation 11/15/14   Owens Shark, NP  megestrol (MEGACE ORAL) 40 MG/ML suspension Take 5 mLs (200 mg total) by mouth 2 (two) times daily. Patient not taking: Reported on 11/18/2014 10/28/14   Owens Shark, NP  morphine (MS CONTIN) 30 MG 12 hr tablet Take 3 tablets (90 mg total) by mouth every 8 (eight) hours. 11/07/14   Ladell Pier, MD  morphine (MSIR) 30 MG tablet TAKE 1 TABLET BY MOUTH EVERY 4 HOURS AS NEEDED FOR MODERATE PAIN Patient not taking: Reported on 11/18/2014 11/11/14   Ladell Pier, MD  omeprazole (PRILOSEC) 40 MG capsule Take 1 capsule (40 mg total) by mouth daily. 12/06/14   Leeanne Rio, MD  Polyethyl Glycol-Propyl Glycol (SYSTANE) 0.4-0.3 % SOLN Apply 1 drop to eye 3 (three) times daily as needed (dry eyes).    Historical Provider, MD  potassium chloride SA (KLOR-CON M20) 20 MEQ  tablet Take 1 tablet (20 mEq total) by mouth daily. 12/06/14   Leeanne Rio, MD  prochlorperazine (COMPAZINE) 10 MG tablet Take 1 tablet (10 mg total) by mouth every 6 (six) hours as needed for nausea or vomiting. 12/13/14   Leeanne Rio, MD  rivaroxaban (XARELTO) 20 MG TABS tablet Take 1 tablet (20 mg total) by mouth every morning. 12/06/14   Leeanne Rio, MD  sorbitol 70 % SOLN Take 15 ml - 30 ml by mouth 2 (two) times a days as needed for constipation Patient not taking: Reported on 11/04/2014 05/30/14   Owens Shark, NP  VIAGRA 50 MG tablet TAKE 1 TABLET AS NEEDED 12/13/14   Leeanne Rio, MD   BP 132/96 mmHg  Pulse 71  Temp(Src)  98.6 F (37 C)  Resp 8  Ht 6' (1.829 m)  Wt 132 lb 11.2 oz (60.192 kg)  BMI 17.99 kg/m2  SpO2 100% Physical Exam  Constitutional: He is oriented to person, place, and time. He appears cachectic. No distress.  HENT:  Head: Normocephalic and atraumatic.  Right Ear: External ear normal.  Left Ear: External ear normal.  Mouth/Throat: Oropharynx is clear and moist. No oropharyngeal exudate.  Eyes: EOM are normal. Pupils are equal, round, and reactive to light.  Neck: Normal range of motion. Neck supple.  Cardiovascular: Normal rate, regular rhythm, normal heart sounds and intact distal pulses.   No murmur heard. Pulmonary/Chest: Effort normal. No respiratory distress. He has no wheezes. He has no rales.  Abdominal: Soft. He exhibits no distension. There is no tenderness.  Musculoskeletal: He exhibits no edema.  Neurological: He is alert and oriented to person, place, and time. A cranial nerve deficit (left facial droop) is present. He exhibits abnormal muscle tone (weakness of the left arm and left leg).  Skin: Skin is warm and dry. No rash noted. He is not diaphoretic.  Flaking dry skin over the entire body  Vitals reviewed.   ED Course  Procedures (including critical care time) Labs Review Labs Reviewed  PROTIME-INR - Abnormal;  Notable for the following:    Prothrombin Time 17.2 (*)    All other components within normal limits  CBC - Abnormal; Notable for the following:    WBC 10.9 (*)    RBC 3.59 (*)    Hemoglobin 10.4 (*)    HCT 31.1 (*)    RDW 16.7 (*)    Platelets 51 (*)    All other components within normal limits  DIFFERENTIAL - Abnormal; Notable for the following:    Neutro Abs 8.2 (*)    Monocytes Absolute 1.2 (*)    All other components within normal limits  COMPREHENSIVE METABOLIC PANEL - Abnormal; Notable for the following:    Chloride 99 (*)    Calcium 8.4 (*)    Total Protein 5.7 (*)    Albumin 3.1 (*)    ALT 15 (*)    Alkaline Phosphatase 261 (*)    Total Bilirubin 1.9 (*)    All other components within normal limits  I-STAT CHEM 8, ED - Abnormal; Notable for the following:    Potassium 3.4 (*)    Chloride 95 (*)    Calcium, Ion 1.08 (*)    Hemoglobin 11.9 (*)    HCT 35.0 (*)    All other components within normal limits  I-STAT TROPOININ, ED - Abnormal; Notable for the following:    Troponin i, poc 0.65 (*)    All other components within normal limits  ETHANOL  APTT  URINE RAPID DRUG SCREEN, HOSP PERFORMED  URINALYSIS, ROUTINE W REFLEX MICROSCOPIC (NOT AT [facility])  CBG MONITORING, ED    Imaging Review Ct Head Wo Contrast  01/13/2015  CLINICAL DATA:  Code stroke.  Left-sided weakness. EXAM: CT HEAD WITHOUT CONTRAST TECHNIQUE: Contiguous axial images were obtained from the base of the skull through the vertex without intravenous contrast. COMPARISON:  None FINDINGS: The brainstem and cerebellum appear normal. Cerebral hemispheres show areas of low-density throughout the deep white matter consistent with chronic small vessel disease. No sign of acute cortical infarction. No mass lesion, hemorrhage, hydrocephalus or extra-axial collection. No calvarial abnormality. Sinuses are clear. IMPRESSION: Low-density in the cerebral hemispheric white matter bilaterally consistent with chronic small  vessel disease, premature for age.  No identifiable acute infarction. Acute infarction could be hidden amongst the extensive chronic changes. I am attempting to call  report. Electronically Signed   By: Nelson Chimes M.D.   On: 01/13/2015 08:55   I have personally reviewed and evaluated these images and lab results as part of my medical decision-making.   EKG Interpretation   Date/Time:  Friday January 13 2015 09:25:13 EST Ventricular Rate:  79 PR Interval:  164 QRS Duration: 86 QT Interval:  390 QTC Calculation: 447 R Axis:   -68 Text Interpretation:  Normal sinus rhythm Left axis deviation Pulmonary  disease pattern Septal infarct , age undetermined Abnormal ECG Artifact  Morphology change in anterior leads, no sichemic change Confirmed by  NGUYEN, EMILY ([id number]) on 01/13/2015 9:56:49 AM      MDM  Patient was seen and evaluated at bedside.  Patient had been called out for CVA by EMS prior to presentation.  Neurology at bedside immediately following CT and had met the patient there.  CT perfusion showed an MCA occlusion.  Patient not a TPA candidate secondary to being on Xarelto.  IR intervention had been discussed by Dr. Leonel Ramsay with family and patient was deemed not an intervention candidate.  The patient has had some headache in the ER.  Troponin noted to be elevated which is likely secondary to large CVA.   ASA given. Patient was admitted for continued medical management, neuro checks with neurology following in consultation, and PT/OT and transition to out of patient care with the new findings and deficits.  Case was discussed with family medicine resident who was in agreement with admission and patient was admitted under the care of Dr. [name]. Final diagnoses:  Cerebral infarction due to unspecified mechanism    1. CVA  2. Left sided weakness    Harvel Quale, MD 01/13/15 1020

## 2015-01-13 NOTE — ED Notes (Signed)
[name] Coming from home with left-sided facial droop and left side paralysis. Hospice pt. Because of pancreatic cancer discovered in may. Pt. Taking xerelto for splenic thrombosis also discovered in  May. Pt. sts he had a headache yesterday. Unknown DNR status. Pt. Vitals en route 152/34mmHg, 80bpm, and 78 CBG.

## 2015-01-13 NOTE — Progress Notes (Signed)
Palliative Medicine Team consult was received.   I met briefly with Mr. Millikan and his wife. We are planing to have a family meeting to discuss goals moving forward tomorrow at 4 PM. He denied any complaints and states that his pain is well controlled currently. Did discuss with his wife his pain regimen at home and she states he has been taking 90 mg of MS Contin 3 times per day. As he currently states his pain is well controlled, I would recommend continuing with the current regimen at this time.  It should be noted that this is a decrease in opioid equivalent dosing from his home regimen. If his pain becomes uncontrolled, please feel free to contact our team if we can be of any assistance and development of pain regimen. If his pain does become an issue, he may be best served by initiation of PCA until we are able to determine his new baseline for pain control.  If there are urgent needs or questions please call [phone]. Thank you for consulting out team to assist with this patients care.  Micheline Rough, MD [place] Team [phone]

## 2015-01-13 NOTE — ED Notes (Signed)
Family Medicine at bedside 

## 2015-01-14 ENCOUNTER — Encounter (HOSPITAL_COMMUNITY): Payer: Self-pay | Admitting: General Practice

## 2015-01-14 DIAGNOSIS — C259 Malignant neoplasm of pancreas, unspecified: Secondary | ICD-10-CM

## 2015-01-14 DIAGNOSIS — Z515 Encounter for palliative care: Secondary | ICD-10-CM | POA: Insufficient documentation

## 2015-01-14 DIAGNOSIS — G893 Neoplasm related pain (acute) (chronic): Secondary | ICD-10-CM

## 2015-01-14 LAB — COMPREHENSIVE METABOLIC PANEL
ALT: 14 U/L — AB (ref 17–63)
AST: 28 U/L (ref 15–41)
Albumin: 2.9 g/dL — ABNORMAL LOW (ref 3.5–5.0)
Alkaline Phosphatase: 267 U/L — ABNORMAL HIGH (ref 38–126)
Anion gap: 11 (ref 5–15)
BILIRUBIN TOTAL: 2 mg/dL — AB (ref 0.3–1.2)
BUN: 8 mg/dL (ref 6–20)
CO2: 26 mmol/L (ref 22–32)
CREATININE: 0.85 mg/dL (ref 0.61–1.24)
Calcium: 8.3 mg/dL — ABNORMAL LOW (ref 8.9–10.3)
Chloride: 102 mmol/L (ref 101–111)
Glucose, Bld: 93 mg/dL (ref 65–99)
Potassium: 3.3 mmol/L — ABNORMAL LOW (ref 3.5–5.1)
Sodium: 139 mmol/L (ref 135–145)
TOTAL PROTEIN: 5.5 g/dL — AB (ref 6.5–8.1)

## 2015-01-14 LAB — CBC
HEMATOCRIT: 26.9 % — AB (ref 39.0–52.0)
Hemoglobin: 9 g/dL — ABNORMAL LOW (ref 13.0–17.0)
MCH: 28.5 pg (ref 26.0–34.0)
MCHC: 33.5 g/dL (ref 30.0–36.0)
MCV: 85.1 fL (ref 78.0–100.0)
Platelets: 51 10*3/uL — ABNORMAL LOW (ref 150–400)
RBC: 3.16 MIL/uL — AB (ref 4.22–5.81)
RDW: 16.4 % — AB (ref 11.5–15.5)
WBC: 10.5 10*3/uL (ref 4.0–10.5)

## 2015-01-14 LAB — LIPID PANEL
CHOLESTEROL: 143 mg/dL (ref 0–200)
HDL: 35 mg/dL — ABNORMAL LOW (ref >40)
LDL Cholesterol: 78 mg/dL (ref 0–99)
Total CHOL/HDL Ratio: 4.1 RATIO
Triglycerides: 150 mg/dL — ABNORMAL HIGH (ref <150)
VLDL: 30 mg/dL (ref 0–40)

## 2015-01-14 MED ORDER — MORPHINE SULFATE ER 30 MG PO TBCR
30.0000 mg | EXTENDED_RELEASE_TABLET | Freq: Three times a day (TID) | ORAL | Status: DC
  Administered 2015-01-14 – 2015-01-18 (×13): 30 mg via ORAL
  Filled 2015-01-14 (×13): qty 1

## 2015-01-14 MED ORDER — POLYETHYLENE GLYCOL 3350 17 G PO PACK
17.0000 g | PACK | Freq: Every day | ORAL | Status: DC
  Administered 2015-01-15 – 2015-01-18 (×4): 17 g via ORAL
  Filled 2015-01-14 (×4): qty 1

## 2015-01-14 NOTE — Evaluation (Signed)
Physical Therapy Evaluation Patient Details Name: Ray [name] MRN: [id number] DOB: 1955/03/21 Today's Date: 01/14/2015   History of Present Illness  Patient has end stage pancreatic cancer and has now sustained Rt CVA with dense Lt HP.  Clinical Impression  Patient appears highly motivated.  Positioned with pillows in recliner for postural re-education with lt arm propped on elevated pillows.  Unclear whether there is alternative access to his home and how much assist wife/family can provide as she works and was not present during examination.  Will continue to follow patient while on this venue of care to progress mobility. Therapy will continue to follow to assist with discharge planning and follow up recommendations.    Follow Up Recommendations CIR;Supervision/Assistance - 24 hour    Equipment Recommendations  Wheelchair (measurements PT)    Recommendations for Other Services Rehab consult     Precautions / Restrictions Precautions Precautions: Fall Precaution Comments: poor proprioception Lt Restrictions Weight Bearing Restrictions: No Other Position/Activity Restrictions: poor trunk control      Mobility  Bed Mobility Overal bed mobility: Needs Assistance Bed Mobility: Supine to Sit     Supine to sit: Max assist     General bed mobility comments: with transition to Lt sidelying and Lt EOB  Transfers Overall transfer level: Needs assistance Equipment used: 1 person hand held assist Transfers: Sit to/from [facility] Sit to Stand: Total assist;From elevated surface Stand pivot transfers: Total assist;From elevated surface       General transfer comment: left knee blocked into extension to prevent buckling, able to take small step with Rt foot during pivot transfer. Standing weight shifts max assist  Ambulation/Gait                Stairs            Wheelchair Mobility    Modified Rankin (Stroke Patients Only) Modified  Rankin (Stroke Patients Only) Pre-Morbid Rankin Score: No symptoms Modified Rankin: Severe disability     Balance Overall balance assessment: Needs assistance Sitting-balance support: Single extremity supported;Feet supported Sitting balance-Leahy Scale: Zero Sitting balance - Comments: falls to Lt, static sitting max assist, able to side sit on Rt elbow supervision Postural control: Left lateral lean Standing balance support: Single extremity supported Standing balance-Leahy Scale: Zero Standing balance comment: left knee buckles unless blocked into extension                             Pertinent Vitals/Pain Pain Assessment: No/denies pain    Home Living Family/patient expects to be discharged to:: Private residence Living Arrangements: Spouse/significant other Available Help at Discharge: Family;Available PRN/intermittently;Other (Comment) (wife works during day`) Type of Home: House Home Access: Stairs to enter Entrance Stairs-Rails: Right Entrance Stairs-Number of Steps: [place]: [place] - single point;Tub bench Additional Comments: enters via garage to primary level with 20 steps, another flight of stairs to bedroom.  He reports they are planning to move to one-level residence in near future.    Prior Function Level of Independence: Independent         Comments: retired Journalist, newspaper   Dominant Hand: Right    Extremity/Trunk Assessment   Upper Extremity Assessment: Defer to OT evaluation (flaccid Lt UE, instructed pt how to reposition and elevate)           Lower Extremity Assessment: LLE deficits/detail   LLE Deficits / Details: 2/5 hip flexor,  0 knee ext, 0 ankle DF/PF  Cervical / Trunk Assessment: Kyphotic  Communication   Communication: Expressive difficulties  Cognition Arousal/Alertness: Awake/alert Behavior During Therapy: WFL for tasks assessed/performed Overall Cognitive Status: Within  Functional Limits for tasks assessed                      General Comments General comments (skin integrity, edema, etc.): very dry skin, shedding in bed, flaccid left side of face [name] speech slurred    Exercises        Assessment/Plan    PT Assessment Patient needs continued PT services  PT Diagnosis Difficulty walking;Generalized weakness;Hemiplegia non-dominant side   PT Problem List Decreased strength;Decreased activity tolerance;Decreased balance;Decreased mobility;Decreased knowledge of use of DME;Decreased safety awareness;Decreased knowledge of precautions  PT Treatment Interventions DME instruction;Gait training;Functional mobility training;Therapeutic activities;Therapeutic exercise;Balance training;Neuromuscular re-education;Patient/family education;Wheelchair mobility training   PT Goals (Current goals can be found in the Care Plan section) Acute Rehab PT Goals Patient Stated Goal: return home as soon as able PT Goal Formulation: With patient Time For Goal Achievement: 01/28/15 Potential to Achieve Goals: Good    Frequency Min 3X/week   Barriers to discharge Inaccessible home environment;Decreased caregiver support unclear whether alternative access into home    Co-evaluation               End of Session Equipment Utilized During Treatment: Gait belt Activity Tolerance: Patient tolerated treatment well Patient left: in chair;with call bell/phone within reach;with family/visitor present;Other (comment) (SLP in room) Nurse Communication: Mobility status;Need for lift equipment;Precautions         Time: [id number] PT Time Calculation (min) (ACUTE ONLY): 35 min   Charges:   PT Evaluation $Initial PT Evaluation Tier I: 1 Procedure PT Treatments $Therapeutic Activity: 8-22 mins   PT G CodesMalka [name], PT [phone]  Ray [name] 01/14/2015, 1:15 PM

## 2015-01-14 NOTE — Progress Notes (Signed)
Family Medicine Teaching Service Daily Progress Note Intern Pager: [phone]  Patient name: Ray [name] Medical record number: [id number] Date of birth: 1955/06/01 Age: 59 y.o. Gender: male  Primary Care Provider: Chrisandra Netters, MD Consultants: neurology, palliative care Code Status: DNR  Pt Overview and Major Events to Date:  12/9 - admitted with R-MCA stroke  Assessment and Plan: Ray [name] is a 59 y.o. male presenting with left sided weakness since approximately 7:15 AM, consistent with stroke . 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 Stroke in the setting of Xarelto use. Will not pursue aggressive work-up or treatment as pt is on hospice related to his end-stage pancreatic cancer  - NPO until speech eval if fails swallow study - telemetry for 24 hours per stroke order set - PT/OT/SLP - Palliative care consulted - family mtg planned for 4pm - SW for consideration of discharge planning - aspirin 325, will hold Xarelto per neuro and will follow up for how long it should be held - neurology following, appreciate recs  Pancreatic Cancer- Diagnosed in 05/2013, s/p chemotherapy and radiation with completion of radiation on 11/25/2014. Oncologist is Dr [name]. He has been made DNR and is receiving home hospice care. He does have significant chronic cancer related pain for which he takes MSIR and MS contin - As he is now NPO until swallow study, will start morphine IV 4 mg q2hr and titrate as needed - if passes swallow study will start home narcotic regimen - will restart PO bowel regimen when able to tolerate PO - Will also discuss comfort feeds despite possible aspiration given his GOC. - Family would like to transition him to hosp[facility] of [facility] as they are unhappy with his current hospice provider, will attempt to facilitate this  Failure to thrive- He has had  increasing fatigue and loss of appetite - When able to take PO, consider nutrition consult, supplements as tolerated. He has in past not liked ensure - restart megace when taking PO  Depression- Stable - Will continue home cymbalta 60 mg qD - consider starting ativan as needed. He has taken it in past prior to procedures  Hypertension- Stable without antihypertensives - will follow closely and treat as needed  Thrombocytopenia- Plt ct 51, he has had chemoradiation recently with most recent radiation therapy on 10/21 - Will follow plt ct  Hypokalemia- K 3.4 - Will follow for now and repeat CMP in AM  FEN/GI: NPO/MIVF 100 cc/hr NS, protonix IV while NPO, restart oral as tolerated Prophylaxis: SCDs  Disposition: home hospice vs [facility]  Subjective:  Patient was very restless last night, daughter thinks he is in considerable pain despite him saying he is not. She reports he is stoic but she can tell he is uncomfortable.  Objective: Temp:  [98.6 F (37 C)] 98.6 F (37 C) (12/09 0910) Pulse Rate:  [50-83] 65 (12/10 0442) Resp:  [8-22] 18 (12/10 0442) BP: (129-192)/(78-105) 159/90 mmHg (12/10 0442) SpO2:  [95 %-100 %] 100 % (12/10 0442) Weight:  [132 lb 11.2 oz (60.192 kg)] 132 lb 11.2 oz (60.192 kg) (12/09 0910) Physical Exam: General: NAD, cachectic, lying in bed HEENT: PERRL, normal oropharynx, MMM, Cardiovascular: RRR, no murmurs auscultated Respiratory: CTAB, normal WOB Abdomen: soft, non tender, + BS MSK: decreased muscle bulk diffusely Skin: diffuse dry skin with flaking Psych: normal mood and affect  Laboratory:  Recent Labs Lab 01/13/15 0834 01/13/15 0841  01/14/15 0523  WBC 10.9*  --  10.5  HGB 10.4* 11.9* 9.0*  HCT 31.1* 35.0* 26.9*  PLT 51*  --  51*    Recent Labs Lab 01/13/15 0834 01/13/15 0841 01/14/15 0523  NA 136 136 139  K 3.6 3.4* 3.3*  CL 99* 95* 102  CO2 28  --  26  BUN 6 7 8   CREATININE 0.98 0.90 0.85  CALCIUM 8.4*  --  8.3*  PROT  5.7*  --  5.5*  BILITOT 1.9*  --  2.0*  ALKPHOS 261*  --  267*  ALT 15*  --  14*  AST 26  --  28  GLUCOSE 95 88 93    Imaging/Diagnostic Tests: Ct Angio Head W/cm &/or Wo Cm  01/13/2015  CLINICAL DATA:  Sudden onset of left-sided weakness. Last seen normal at 7:15 a.m. 01/13/2015. History of pancreatic cancer. EXAM: CT ANGIOGRAPHY HEAD AND NECK TECHNIQUE: Multidetector CT imaging of the head and neck was performed using the standard protocol during bolus administration of intravenous contrast. Multiplanar CT image reconstructions and MIPs were obtained to evaluate the vascular anatomy. Carotid stenosis measurements (when applicable) are obtained utilizing NASCET criteria, using the distal internal carotid diameter as the denominator. CONTRAST:  31mL OMNIPAQUE IOHEXOL 350 MG/ML SOLN. A total of 90 mL Omnipaque was given, with 40 mL used for perfusion. COMPARISON:  CT head earlier today. CT perfusion reported separately. FINDINGS: CT HEAD Reported separately at 0841 hours.  No hemorrhage. CTA NECK Aortic arch: Standard branching. Imaged portion shows no evidence of aneurysm or dissection. No significant stenosis of the major arch vessel origins. Right carotid system: No evidence of dissection, stenosis (50% or greater) or occlusion. Left carotid system: No evidence of dissection, stenosis (50% or greater) or occlusion. Vertebral arteries: RIGHT vertebral dominant without proximal disease. No evidence for dissection or occlusion. 50% ostial stenosis at the origin of the smaller LEFT vertebral. No subclavian disease of significance. Nonvascular soft tissues: Port-A-Cath good position. Severe spondylosis. No visible lung metastases. Considerable degenerative change at C1-C2. CTA HEAD Anterior circulation: Minor BILATERAL cavernous carotid atherosclerotic calcifications. Occluded RIGHT M1 MCA. This occlusion begins 5 mm from the RIGHT ICA terminus. There is preserved flow into the RIGHT MCA M2 and M3 branches,  perhaps around the clot or via pial collaterals. No other anterior circulation disease of significance. No significant stenosis, proximal occlusion, aneurysm, or vascular malformation. Posterior circulation: RIGHT vertebral dominant. No significant stenosis, proximal occlusion, aneurysm, or vascular malformation. Venous sinuses: As permitted by contrast timing, patent. Anatomic variants: None of significance. Delayed phase:   No abnormal intracranial enhancement. IMPRESSION: Proximal large vessel occlusion RIGHT M1 MCA. Some flow is preserved into the RIGHT MCA M2 and M3 segments. No other intracranial or extracranial stenosis or occlusion of significance. Ordering provider was paged with these results at 9:09 a.m. while the study was in progress. Electronically Signed   By: Staci Righter M.D.   On: 01/13/2015 10:42   Ct Head Wo Contrast  01/13/2015  CLINICAL DATA:  Code stroke.  Left-sided weakness. EXAM: CT HEAD WITHOUT CONTRAST TECHNIQUE: Contiguous axial images were obtained from the base of the skull through the vertex without intravenous contrast. COMPARISON:  None FINDINGS: The brainstem and cerebellum appear normal. Cerebral hemispheres show areas of low-density throughout the deep white matter consistent with chronic small vessel disease. No sign of acute cortical infarction. No mass lesion, hemorrhage, hydrocephalus or extra-axial collection. No calvarial abnormality. Sinuses are clear. IMPRESSION: Low-density in the cerebral hemispheric  white matter bilaterally consistent with chronic small vessel disease, premature for age. No identifiable acute infarction. Acute infarction could be hidden amongst the extensive chronic changes. I am attempting to call  report. Electronically Signed   By: Nelson Chimes M.D.   On: 01/13/2015 08:55   Ct Angio Neck W/cm &/or Wo/cm  01/13/2015  CLINICAL DATA:  Sudden onset of left-sided weakness. Last seen normal at 7:15 a.m. 01/13/2015. History of pancreatic cancer.  EXAM: CT ANGIOGRAPHY HEAD AND NECK TECHNIQUE: Multidetector CT imaging of the head and neck was performed using the standard protocol during bolus administration of intravenous contrast. Multiplanar CT image reconstructions and MIPs were obtained to evaluate the vascular anatomy. Carotid stenosis measurements (when applicable) are obtained utilizing NASCET criteria, using the distal internal carotid diameter as the denominator. CONTRAST:  27mL OMNIPAQUE IOHEXOL 350 MG/ML SOLN. A total of 90 mL Omnipaque was given, with 40 mL used for perfusion. COMPARISON:  CT head earlier today. CT perfusion reported separately. FINDINGS: CT HEAD Reported separately at 0841 hours.  No hemorrhage. CTA NECK Aortic arch: Standard branching. Imaged portion shows no evidence of aneurysm or dissection. No significant stenosis of the major arch vessel origins. Right carotid system: No evidence of dissection, stenosis (50% or greater) or occlusion. Left carotid system: No evidence of dissection, stenosis (50% or greater) or occlusion. Vertebral arteries: RIGHT vertebral dominant without proximal disease. No evidence for dissection or occlusion. 50% ostial stenosis at the origin of the smaller LEFT vertebral. No subclavian disease of significance. Nonvascular soft tissues: Port-A-Cath good position. Severe spondylosis. No visible lung metastases. Considerable degenerative change at C1-C2. CTA HEAD Anterior circulation: Minor BILATERAL cavernous carotid atherosclerotic calcifications. Occluded RIGHT M1 MCA. This occlusion begins 5 mm from the RIGHT ICA terminus. There is preserved flow into the RIGHT MCA M2 and M3 branches, perhaps around the clot or via pial collaterals. No other anterior circulation disease of significance. No significant stenosis, proximal occlusion, aneurysm, or vascular malformation. Posterior circulation: RIGHT vertebral dominant. No significant stenosis, proximal occlusion, aneurysm, or vascular malformation. Venous  sinuses: As permitted by contrast timing, patent. Anatomic variants: None of significance. Delayed phase:   No abnormal intracranial enhancement. IMPRESSION: Proximal large vessel occlusion RIGHT M1 MCA. Some flow is preserved into the RIGHT MCA M2 and M3 segments. No other intracranial or extracranial stenosis or occlusion of significance. Ordering provider was paged with these results at 9:09 a.m. while the study was in progress. Electronically Signed   By: Staci Righter M.D.   On: 01/13/2015 10:42   Ct Cerebral Perfusion W/cm  01/13/2015  CLINICAL DATA:  Sudden onset of left-sided weakness. EXAM: CT CEREBRAL PERFUSION WITH CONTRAST TECHNIQUE: Sixteen contiguous slices, 5 mm thick, from the middle cranial fossa through the convexity were obtained during the bolus infusion of 40 mL Isovue 370. Post processing software was used to analyze cerebral blood volume, cerebral blood flow, mean transit time, and time to peak enhancement. CONTRAST:  40mL OMNIPAQUE IOHEXOL 350 MG/ML SOLN COMPARISON:  Noncontrast CT head performed earlier in the day. CTA head neck reported separately. FINDINGS: There is a proximal M1 occlusion on the RIGHT resulting in significant reduction of CBF, prolonged MTT, and delayed TTP throughout much of the RIGHT MCA territory, including the frontal, parietal, temporal, and insular regions. There is sparing of the basal ganglia, including the lentiform nucleus. Cerebral blood volume, however, is normal throughout the affected areas indicating a significant mismatch or penumbra. IMPRESSION: Abnormal CT perfusion scan consistent with a large ischemic penumbra  in this patient with a proximal RIGHT MCA M1 large vessel occlusion. The ordering provider was paged with these results at 9:09 a.m. while the study was still in progress. Electronically Signed   By: Staci Righter M.D.   On: 01/13/2015 10:26    Frazier Richards, MD 01/14/2015, 8:31 AM PGY-3, [place] Intern pager:  [phone], text pages welcome

## 2015-01-14 NOTE — Progress Notes (Signed)
STROKE TEAM PROGRESS NOTE   HISTORY Ray [name] is a 59 y.o. male with a history of pancreatic cancer who presents with left sided weakness that started after 7:15 am. He was last seen well at that time and had been up this morning. At baseline, he has to use a walker and is requiring some help throughout the day, but is able to still do most of his ADLs.   He was seen to be slumped over later, but did not recognize that he had deficits. He was transported to the ER where he was found to have a large left MCA occlusion.   Neurology discussed IR intervention with his wife, and after discussion with Dr. Estanislado Pandy did offer this therapy, however, given the patient's recent decline, and aggressiveness of the procedure, this therapy was not pursued.    LKW: 7:15 am.  tpa given?: no, on xarelto.    SUBJECTIVE (INTERVAL HISTORY) His church members are at the bedside.  Overall he unable to discuss his condition.  By exam, his exam is unchanged.   Family has a scheduled meeting with Palliative at [place]:  [98.6 F (37 C)] 98.6 F (37 C) (12/09 0910) Pulse Rate:  [50-83] 65 (12/10 0442) Cardiac Rhythm:  [-] Normal sinus rhythm (12/09 1902) Resp:  [8-22] 18 (12/10 0442) BP: (129-192)/(78-105) 159/90 mmHg (12/10 0442) SpO2:  [95 %-100 %] 100 % (12/10 0442) Weight:  [60.192 kg (132 lb 11.2 oz)] 60.192 kg (132 lb 11.2 oz) (12/09 0910)  CBC:  Recent Labs Lab 01/13/15 0834 01/13/15 0841 01/14/15 0523  WBC 10.9*  --  10.5  NEUTROABS 8.2*  --   --   HGB 10.4* 11.9* 9.0*  HCT 31.1* 35.0* 26.9*  MCV 86.6  --  85.1  PLT 51*  --  51*    Basic Metabolic Panel:  Recent Labs Lab 01/13/15 0834 01/13/15 0841 01/14/15 0523  NA 136 136 139  K 3.6 3.4* 3.3*  CL 99* 95* 102  CO2 28  --  26  GLUCOSE 95 88 93  BUN 6 7 8   CREATININE 0.98 0.90 0.85  CALCIUM 8.4*  --  8.3*    Lipid Panel:    Component Value Date/Time   CHOL 143 01/14/2015 0523   TRIG 150* 01/14/2015 0523    HDL 35* 01/14/2015 0523   CHOLHDL 4.1 01/14/2015 0523   VLDL 30 01/14/2015 0523   LDLCALC 78 01/14/2015 0523   HgbA1c: No results found for: HGBA1C Urine Drug Screen: No results found for: LABOPIA, COCAINSCRNUR, LABBENZ, AMPHETMU, THCU, LABBARB    IMAGING  Ct Angio Head W/cm &/or Wo Cm 01/13/2015   Proximal large vessel occlusion RIGHT M1 MCA. Some flow is preserved into the RIGHT MCA M2 and M3 segments. No other intracranial or extracranial stenosis or occlusion of significance.   Ct Head Wo Contrast 01/13/2015   Low-density in the cerebral hemispheric white matter bilaterally consistent with chronic small vessel disease, premature for age. No identifiable acute infarction. Acute infarction could be hidden amongst the extensive chronic changes.  Ct Angio Neck W/cm &/or Wo/cm 01/13/2015   Proximal large vessel occlusion RIGHT M1 MCA. Some flow is preserved into the RIGHT MCA M2 and M3 segments. No other intracranial or extracranial stenosis or occlusion of significance.   Ct Cerebral Perfusion W/cm 01/13/2015   Abnormal CT perfusion scan consistent with a large ischemic penumbra in this patient with a proximal RIGHT MCA M1 large vessel occlusion.   PHYSICAL EXAM  Physical Exam General -thin, in NAD   Cardiovascular - Regular rate and rhythm Pulmonary: CTA Abdomen: NT, ND, normal bowel sounds Extremities: No C/C/E  Neurological Exam Mental Status: Does not vocalize; however, will follow commands; lack of speech appears volitional Orientation:  Unable to assess Speech:  Unable to assess Cranial Nerves:  PERRL; OCR intact; does not blink to threat.  Face grossly symmetric, hearing cannot be assessed; exam limited by LOC  Motor/Sensory: plegic on the left; does not withdraw to stimuli; follows commands on the right  Coordination:  Not tested  Gait: Deferred  ASSESSMENT/PLAN [name] is a 59 y.o. male with history of pancreatic cancer, history of splenic  vein thrombosis, hyperlipidemia, hypertension, and gout presenting with left hemiparesis. He did not receive IV t-PA due to Xarelto therapy.   Stroke:  Non-dominant infarct embolic secondary to hypercoagulable state due to malignancy.  Resultant left sided weakness  MRI - not performed  MRA - not performed  Carotid Doppler - refer to CTA of the neck  CTA head and neck - Proximal large vessel occlusion RIGHT M1 MCA.   2D Echo - not ordered  LDL - 78  HgbA1c pending  VTE prophylaxis - SCDs     Xarelto (rivaroxaban) daily prior to admission, now on aspirin 300 mg suppository daily  Patient counseled to be compliant with his antithrombotic medications  Ongoing aggressive stroke risk factor management  Therapy recommendations:  Pending  Disposition: Pending  Hypertension  BP running high  Permissive hypertension (OK if < 220/120) but gradually normalize in 5-7 days  Hyperlipidemia  Home meds: No lipid lowering medications prior to admission.  LDL 78, goal < 70  Continue statin at discharge  Other Stroke Risk Factors  Advanced age  Cigarette smoker, quit 20 years ago. Now smokes one cigar per day.   Other Active Problems  Pancreatic cancer  Hypokalemia  Anemia / thrombocytopenia  Hypocalcemia  Hospital day # 1   ATTENDING NOTE: Patient was seen and examined by me personally. Documentation accurately reflects findings. The laboratory and radiographic studies reviewed by me. ROS pertinent positives could not be fully documented due to LOC?  Willingness to participate in exam given ease at which patient opens eyes and follows commands and falls back to sleep Assessment and plan completed by me personally and fully documented above. Plans include:  Appreciate the role of Palliative Care.  Will await results of 4pm.  Family may not wish aggressive measures SIGNED BY: Dr. Elissa Hefty      To contact Stroke Continuity provider, please refer to  [email or web address] After hours, contact General Neurology

## 2015-01-14 NOTE — Consult Note (Signed)
Consultation Note Date: 01/14/2015   Patient Name: Ray [name]  DOB: [birth year]  MRN: [id number]  Age / Sex: 59 y.o., male  PCP: Leeanne Rio, MD Referring Physician: Leeanne Rio, MD  Reason for Consultation: Establishing goals of care, Hospice Evaluation, Non pain symptom management and Pain control  Clinical Assessment/Narrative: [name] is a very pleasant 59 year old gentleman with unfortunate diagnosis of pancreatic cancer. He was admitted to the hospital after suffering from a large MCA stroke with residual deficits of left-sided flaccidity and paralysis. He has been enrolled with home hospice with a [facility] but his family has been unhappy with the services he has been provided.  I met with Dr. [name], [name], his wife, his daughter Ray [name], and his daughter Ray [name] (via phone).  Mr. Spinella slept much of the encounter but did awake for the end of our conversation and was in agreement with all plans that were reviewed with him based upon prior conversation with his family.  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  They report they have a good understanding of his current clinical situation and his doctors have been doing a good job explaining things to them. We discussed options moving forward for his care including option of transitioning home with hospice support of a different hospice organization, skilled facility for rehabilitation, and possible acceptance to [facility] for inpatient rehabilitation. His family is adamant that they are planning on taking him home and that he would want to spend time at home with his family versus spending that time trying to do rehabilitation which they feel would be too taxing for him in his current state.  His confirms that he had been taking MS Contin 60-90 mg 3 times per  day. During his hospitalization, he has not been receiving his long-acting medication to this point in time due to the fact he was nothing by mouth. He had been receiving infrequent doses of IV morphine but reports that his pain has been well controlled. He has been noted by his daughter and bedside nurse to have periods of agitation and seemed to get better after receiving IV morphine. He does have a couple of episodes of yawning during our encounter.  Contacts/Participants in Discussion: Patient, his wife, and his daughters Primary Decision Maker: Patient and his wife Relationship to Patient self and spouse  SUMMARY OF RECOMMENDATIONS - [name] and his family are in agreement that his goal is to go home with hospice support. They would like to enroll with hospice and palliative care of [facility] on discharge. I'll place a consult to care management to begin setting this up. He will need equipment delivered prior to discharge  - His family has very good insight into his current condition and are very understanding of the fact that he is approaching the end of his life. - We discussed the risk versus benefit of feeding for comfort versus continue dysphagia diet. They would like to continue with SLP recommendations at this time but expressed appreciation about knowing this is something that can be changed in the future if he so desires. - We discussed risk versus benefit of continuing Xarelto. This is something primary team will discontinue. - He has had significant decrease in his pain medication since admission. As he is now able to take pills, we will begin to add back long-acting medication at reduced dose. I would have a  very low threshold to increase as needed as there is a high likelihood his needs will continue to increase in order to have adequate pain control.  Code Status/Advance Care Planning: DNR    Code Status Orders        Start     Ordered   01/13/15 1309  Do not attempt  resuscitation (DNR)   Continuous    Question Answer Comment  In the event of cardiac or respiratory ARREST Do not call a "code blue"   In the event of cardiac or respiratory ARREST Do not perform Intubation, CPR, defibrillation or ACLS   In the event of cardiac or respiratory ARREST Use medication by any route, position, wound care, and other measures to relive pain and suffering. May use oxygen, suction and manual treatment of airway obstruction as needed for comfort.      01/13/15 1308    Advance Directive Documentation        Most Recent Value   Type of Advance Directive  Healthcare Power of Attorney, Living will   Pre-existing out of facility DNR order (yellow form or pink MOST form)     "MOST" Form in Place?       Symptom Management:   Pain: [name] was on significantly higher dose of pain medication prior to admission. His long-acting oral morphine equivalent was 180-270 mg daily plus an additional 2-3 doses of 30 mg short acting oral morphine. Since his admission he has received 4 mg of IV morphine very infrequently on as-needed basis. As he is now able to tolerate by mouth, we'll begin to add back on long-acting medication as he has been showing some signs of pain.  It would also be concerned for withdrawal symptoms as he likely has developed physiologic tolerance after being on medication for an extended time period of time. He did display yawning during encounter and has been noted to have periods of being a little bit restless and agitated. No mydriasis, piloerection, nausea, vomiting, or diarrhea noted.  Will plan for addition of MS Contin 30 mg 3 times a day and continue with IV rescue medication on as-needed basis during admission.  Palliative Prophylaxis:   Aspiration, Bowel Regimen, Frequent Pain Assessment and Turn Reposition   Psycho-social/Spiritual:  Support System: Strong Desire for further Chaplaincy support:no Additional Recommendations: Grief/Bereavement  Support  Prognosis: Likely weeks to months based upon clinical condition at this time. However,  he is at high risk for acute decompensation with risk of recurrent stroke or acute aspiration event in addition to the prior complications from his advanced cancer. With pancreatic cancer complicated by new stroke, his overall prognosis would be less than 6 months and he will be well served by hospice support.  Discharge Planning: Home with Hospice. He would like to enroll with Hospice and [facility] on discharge   Chief Complaint/ Primary Diagnoses: Present on Admission:  . CVA (cerebral infarction) . Stroke (cerebrum) ([place])  I have reviewed the medical record, interviewed the patient and family, and examined the patient. The following aspects are pertinent.  Past Medical History  Diagnosis Date  . Blood transfusion   . GERD (gastroesophageal reflux disease)   . Hyperlipidemia   . Hypertension   . H/O hiatal hernia   . Arthritis     in his back, hips, knees, R shoulder   . Colon polyps     TUBULAR ADENOMA  . Splenic vein thrombosis   . Liver cyst   . Hx of  pancreatitis   . Gout   . Pancreatic cancer ([place]) 07/06/13    adenocarcinoma  . Blood transfusion without reported diagnosis    Social History   Social History  . Marital Status: Married    Spouse Name: N/A  . Number of Children: 3  . Years of Education: N/A   Occupational History  . disabled [employer]   Social History Main Topics  . Smoking status: Current Every Day Smoker    Types: Cigars  . Smokeless tobacco: Never Used     Comment: one a day cigar x 6 months. Quit cigarettes -20 yrs ago.  . Alcohol Use: No     Comment: Quit heavy use 30 yrs ago.none now  . Drug Use: No  . Sexual Activity: Yes   Other Topics Concern  . None   Social History Narrative   Married, wife [name]   Ambulates w/cane   Family History  Problem Relation Age of Onset  . Colon cancer Neg Hx   . Esophageal  cancer Neg Hx   . Stomach cancer Neg Hx   . Rectal cancer Neg Hx    Scheduled Meds: . aspirin  325 mg Oral Daily  . DULoxetine  60 mg Oral Daily  . morphine  30 mg Oral 3 times per day  . pantoprazole (PROTONIX) IV  40 mg Intravenous QHS  . [START ON 01/15/2015] polyethylene glycol  17 g Oral Daily   Continuous Infusions: . sodium chloride 0.9 % 1,000 mL infusion 100 mL/hr at 01/14/15 1034   PRN Meds:.morphine injection Medications Prior to Admission:  Prior to Admission medications   Medication Sig Start Date End Date Taking? Authorizing Provider  amLODipine (NORVASC) 10 MG tablet Take 10 mg by mouth daily.   Yes Historical Provider, MD  DULoxetine (CYMBALTA) 60 MG capsule Take 1 capsule (60 mg total) by mouth daily. TAKE 1 CAPSULE DAILY 12/06/14  Yes Leeanne Rio, MD  LORazepam (ATIVAN) 1 MG tablet Take 1 tablet (1 mg) one hour prior to radiation Patient taking differently: Take 1 mg by mouth every 8 (eight) hours as needed for anxiety or sedation. Take 1 tablet (1 mg) one hour prior to radiation 11/15/14  Yes Owens Shark, NP  morphine (MS CONTIN) 30 MG 12 hr tablet Take 3 tablets (90 mg total) by mouth every 8 (eight) hours. 11/07/14  Yes Ladell Pier, MD  omeprazole (PRILOSEC) 40 MG capsule Take 1 capsule (40 mg total) by mouth daily. 12/06/14  Yes Leeanne Rio, MD  Polyethyl Glycol-Propyl Glycol (SYSTANE) 0.4-0.3 % SOLN Apply 1 drop to eye 3 (three) times daily as needed (dry eyes).   Yes Historical Provider, MD  potassium chloride SA (KLOR-CON M20) 20 MEQ tablet Take 1 tablet (20 mEq total) by mouth daily. 12/06/14  Yes Leeanne Rio, MD  prochlorperazine (COMPAZINE) 10 MG tablet Take 1 tablet (10 mg total) by mouth every 6 (six) hours as needed for nausea or vomiting. 12/13/14  Yes Leeanne Rio, MD  rivaroxaban (XARELTO) 20 MG TABS tablet Take 1 tablet (20 mg total) by mouth every morning. 12/06/14  Yes Leeanne Rio, MD  VIAGRA 50 MG tablet TAKE 1  TABLET AS NEEDED 12/13/14  Yes Leeanne Rio, MD   No Known Allergies  Review of Systems  Constitutional: Positive for activity change, appetite change and fatigue.  Gastrointestinal: Positive for abdominal pain.  Musculoskeletal: Positive for back pain.  Neurological: Positive for weakness.  All other systems reviewed  and are negative.   Physical Exam  Constitutional: No distress.  Elderly cachectic male, in bed, Sleepy throughout encounter  Eyes: Conjunctivae are normal. No scleral icterus.  Neck: No JVD present.  Cardiovascular: Normal rate and regular rhythm.   Respiratory: No stridor. No respiratory distress. He has no wheezes.  GI: He exhibits no distension. There is no tenderness.  Musculoskeletal: He exhibits edema.  Neurological: He is alert. A cranial nerve deficit is present.  Skin: Skin is warm and dry. He is not diaphoretic.    Vital Signs: BP 149/90 mmHg  Pulse 61  Temp(Src) 100.2 F (37.9 C) (Oral)  Resp 18  Ht 6' (1.829 m)  Wt 60.192 kg (132 lb 11.2 oz)  BMI 17.99 kg/m2  SpO2 100%  SpO2: SpO2: 100 % O2 Device:SpO2: 100 % O2 Flow Rate: .   IO: Intake/output summary: No intake or output data in the 24 hours ending 01/14/15 1855  LBM: Last BM Date: 01/10/15 Baseline Weight: Weight: 60.192 kg (132 lb 11.2 oz) Most recent weight: Weight: 60.192 kg (132 lb 11.2 oz)      Palliative Assessment/Data:  Flowsheet Rows        Most Recent Value   Intake Tab    Referral Department  Hospitalist   Unit at Time of Referral  Cardiac/Telemetry Unit   Palliative Care Primary Diagnosis  Cancer   Date Notified  01/13/15   Palliative Care Type  New Palliative care   Date of Admission  01/13/15   Date first seen by Palliative Care  01/13/15   # of days Palliative referral response time  0 Day(s)   # of days IP prior to Palliative referral  0   Clinical Assessment    Palliative Performance Scale Score  50%   Pain Max last 24 hours  4   Pain Min Last 24 hours   0   Psychosocial & Spiritual Assessment    Palliative Care Outcomes    Patient/Family meeting held?  Yes   Who was at the meeting?  Patient, wife, daughter      Additional Data Reviewed:  CBC:    Component Value Date/Time   WBC 10.5 01/14/2015 0523   WBC 4.3 11/15/2014 1309   HGB 9.0* 01/14/2015 0523   HGB 13.4 11/15/2014 1309   HGB 13.5* 06/08/2013 1445   HCT 26.9* 01/14/2015 0523   HCT 38.8 11/15/2014 1309   PLT 51* 01/14/2015 0523   PLT 140 Occ Large & giant platelets 11/15/2014 1309   MCV 85.1 01/14/2015 0523   MCV 78.5* 11/15/2014 1309   NEUTROABS 8.2* 01/13/2015 0834   NEUTROABS 3.6 11/15/2014 1309   LYMPHSABS 1.3 01/13/2015 0834   LYMPHSABS 0.4* 11/15/2014 1309   MONOABS 1.2* 01/13/2015 0834   MONOABS 0.4 11/15/2014 1309   EOSABS 0.1 01/13/2015 0834   EOSABS 0.0 11/15/2014 1309   BASOSABS 0.0 01/13/2015 0834   BASOSABS 0.0 11/15/2014 1309   Comprehensive Metabolic Panel:    Component Value Date/Time   NA 139 01/14/2015 0523   NA 140 11/15/2014 1310   K 3.3* 01/14/2015 0523   K 3.7 11/15/2014 1310   CL 102 01/14/2015 0523   CO2 26 01/14/2015 0523   CO2 23 11/15/2014 1310   BUN 8 01/14/2015 0523   BUN 9.8 11/15/2014 1310   CREATININE 0.85 01/14/2015 0523   CREATININE 1.0 11/15/2014 1310   CREATININE 1.09 01/21/2011 1042   GLUCOSE 93 01/14/2015 0523   GLUCOSE 132 11/15/2014 1310   CALCIUM 8.3* 01/14/2015  0523   CALCIUM 9.8 11/15/2014 1310   AST 28 01/14/2015 0523   AST 13 11/15/2014 1310   ALT 14* 01/14/2015 0523   ALT <9 11/15/2014 1310   ALKPHOS 267* 01/14/2015 0523   ALKPHOS 85 11/15/2014 1310   BILITOT 2.0* 01/14/2015 0523   BILITOT 0.68 11/15/2014 1310   PROT 5.5* 01/14/2015 0523   PROT 7.4 11/15/2014 1310   ALBUMIN 2.9* 01/14/2015 0523   ALBUMIN 4.3 11/15/2014 1310     Time In: 1600 Time Out: 1720 Time Total: 80 Greater than 50%  of this time was spent counseling and coordinating care related to the above assessment and plan.  Signed  by: Micheline Rough, MD  Micheline Rough, MD  01/14/2015, 6:55 PM  Please contact Palliative Medicine Team phone at [phone] for questions and concerns.

## 2015-01-14 NOTE — Progress Notes (Signed)
Rehab Admissions Coordinator Note:  Patient was screened by Retta Diones for appropriateness for an Inpatient Acute Rehab Consult.  At this time, we are recommending Inpatient Rehab consult.  Jodell Cipro M 01/14/2015, 2:14 PM  I can be reached at [phone].

## 2015-01-14 NOTE — Evaluation (Signed)
Clinical/Bedside Swallow Evaluation Patient Details  Name: Ray [name] MRN: [id number] Date of Birth: Nov 28, 1955  Today's Date: 01/14/2015 Time: SLP Start Time (ACUTE ONLY): 1210 SLP Stop Time (ACUTE ONLY): 1251 SLP Time Calculation (min) (ACUTE ONLY): 41 min  Past Medical History:  Past Medical History  Diagnosis Date  . Blood transfusion   . GERD (gastroesophageal reflux disease)   . Hyperlipidemia   . Hypertension   . H/O hiatal hernia   . Arthritis     in his back, hips, knees, R shoulder   . Colon polyps     TUBULAR ADENOMA  . Splenic vein thrombosis   . Liver cyst   . Hx of pancreatitis   . Gout   . 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 Blood transfusion without reported diagnosis    Past Surgical History:  Past Surgical History  Procedure Laterality Date  . Hip surgery      x2 right hip- retained hardware ambulates with cane  . Shoulder surgery      right  . Hand surgery      left thumb tendon repair  . Femur hardware removal  2011    R femur- prothesis removed & replaced  . Posterior cervical fusion/foraminotomy N/A 04/20/2012    Procedure: POSTERIOR CERVICAL FUSION/FORAMINOTOMY LEVEL 1;  Surgeon: Ophelia Charter, MD;  Location: [facility];  Service: Neurosurgery;  Laterality: N/A;  C1/2 laminectomies with posterior screw fixation  . Colonoscopy w/ biopsies  04/15/2011  . Eus N/A 06/10/2013    Procedure: UPPER ENDOSCOPIC ULTRASOUND (EUS) LINEAR;  Surgeon: Milus Banister, MD;  Location: [facility] END[facility]COPY;  Service: Endoscopy;  Laterality: N/A;  . Eus Bilateral 07/15/2013    Procedure: ESOPHAGEAL ENDOSCOPIC ULTRASOUND (EUS) RADIAL;  Surgeon: Milus Banister, MD;  Location: [facility] ENDOSCOPY;  Service: Endoscopy;  Laterality: Bilateral;  . Eus N/A 08/05/2013    Procedure: ESOPHAGEAL ENDOSCOPIC ULTRASOUND (EUS) RADIAL;  Surgeon: Milus Banister, MD;  Location: [facility] ENDOSCOPY;  Service: Endoscopy;  Laterality: N/A;  . Neurolytic celiac plexus N/A 08/05/2013     Procedure: NEUROLYTIC CELIAC PLEXUS;  Surgeon: Milus Banister, MD;  Location: [facility] ENDOS[facility];  Service: Endoscopy;  Laterality: N/A;  . Eus N/A 03/31/2014    Procedure: UPPER ENDOSCOPIC ULTRASOUND (EUS) LINEAR;  Surgeon: Milus Banister, MD;  Location: [facility];  Service: Endoscopy;  Laterality: N/A;   celiac plexus neurolysis  . Neurolytic celiac plexus N/A 03/31/2014    Procedure: NEUROLYTIC CELIAC PLEXUS;  Surgeon: Milus Banister, MD;  Location: [facility] ENDOSCOPY;  Service: Endoscopy;  Laterality: N/A;   HPI:  Ray [name] is a 59 y.o. male presenting with left sided weakness since approximately 7:15 AM, consistent with stroke . PMH is significant for pancreatic cancer, splenic vein thrombosis. 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 No prior history of dysphagia   Assessment / Plan / Recommendation Clinical Impression  Pt presents with moderate neurogenic oropharyngeal dysphagia. 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  Noted poor management of secretions with left sided drooling present prior to PO trials. Pt with suspected delayed swallow across all consistencies. Overt coughing exhibited following thin liquids via teaspoon and cup sip along with moderate left sided anterior spillage. Pt without overt signs or sypmtoms of aspiration with nectar thick  trials though swallow initiaton continued to appear delayed. Vocal quality however remained clear. Pt with prolonged matication of solid PO. Educated regarding compensatory swallow strategies including placement of bolus on stronger right side of oral cavity. Discussed plans of care given pallative involvement. Pt in agreement with temporary diet modification until objective swallow study can confirm safest least restrictive diet.  Recommend dysphagia 2 (chopped) and nectar thick liquid diet. Plan for future objective swallow study as scheduling permits.     Aspiration Risk  Moderate aspiration risk    Diet Recommendation     Medication Administration: Whole meds with puree    Other  Recommendations Oral Care Recommendations: Oral care BID Other Recommendations: Order thickener from pharmacy   Follow up Recommendations       Frequency and Duration min 2x/week  1 week       Prognosis Prognosis for Safe Diet Advancement: Guarded Barriers to Reach Goals: Severity of deficits      Swallow Study   General Date of Onset: 01/13/15 HPI: Ray [name] is a 59 y.o. male presenting with left sided weakness since approximately 7:15 AM, consistent with stroke . PMH is significant for pancreatic cancer, splenic vein thrombosis. 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 No prior history of dysphagia Type of Study: Bedside Swallow Evaluation Previous Swallow Assessment: none  Diet Prior to this Study: NPO Temperature Spikes Noted: Yes Respiratory Status: Room air History of Recent Intubation: No Behavior/Cognition: Alert;Cooperative Oral Cavity Assessment:  (increased secretions with poor managment noted, left droolin) Oral Cavity - Dentition: Adequate natural dentition Vision: Functional for self-feeding Self-Feeding Abilities: Needs assist Patient Positioning: Upright in chair Baseline Vocal Quality: Low vocal intensity Volitional Cough: Weak Volitional Swallow: Able to elicit    Oral/Motor/Sensory Function Overall Oral Motor/Sensory Function: Moderate impairment Facial ROM: Reduced left;Suspected CN VII (facial) dysfunction Facial Symmetry: Abnormal symmetry left;Suspected CN VII (facial) dysfunction Facial Strength: Reduced left;Suspected CN VII (facial) dysfunction Facial Sensation: Suspected CN V (Trigeminal) dysfunction;Reduced left Lingual ROM: Reduced left;Suspected  CN XII (hypoglossal) dysfunction Lingual Symmetry: Abnormal symmetry left;Suspected CN XII (hypoglossal) dysfunction Lingual Strength: Suspected CN XII (hypoglossal) dysfunction;Reduced Lingual Sensation: Reduced;Suspected CN VII (facial) dysfunction-anterior 2/3 tongue   Ice Chips Ice chips: Impaired Presentation: Spoon Oral Phase Impairments: Reduced labial seal;Reduced lingual movement/coordination Oral Phase Functional Implications: Prolonged oral transit Pharyngeal Phase Impairments: Suspected delayed Swallow   Thin Liquid Thin Liquid: Impaired Presentation: Cup;Spoon Oral Phase Impairments: Reduced labial seal Oral Phase Functional Implications: Left anterior spillage;Prolonged oral transit Pharyngeal  Phase Impairments: Suspected delayed Swallow;Cough - Immediate;Multiple swallows;Wet Vocal Quality    Nectar Thick Nectar Thick Liquid: Impaired Presentation: Cup Oral Phase Impairments: Reduced lingual movement/coordination Oral phase functional implications: Prolonged oral transit Pharyngeal Phase Impairments: Multiple swallows;Suspected delayed Swallow   Honey Thick Honey Thick Liquid: Not tested   Puree Puree: Impaired Oral Phase Functional Implications: Prolonged oral transit;Oral residue Pharyngeal Phase Impairments: Suspected delayed Swallow   Solid Solid: Impaired Presentation: Self Fed Oral Phase Impairments: Impaired mastication;Reduced lingual movement/coordination Oral Phase Functional Implications: Prolonged oral transit;Left lateral sulci pocketing;Impaired mastication;Oral residue Pharyngeal Phase Impairments: Suspected delayed Swallow      Arvil Chaco MA, CCC-SLP Acute Care Speech Language Pathologist    Arvil Chaco E 01/14/2015,1:27 PM

## 2015-01-15 MED ORDER — LORAZEPAM 1 MG PO TABS
1.0000 mg | ORAL_TABLET | Freq: Four times a day (QID) | ORAL | Status: DC | PRN
  Administered 2015-01-15 – 2015-01-16 (×3): 1 mg via ORAL
  Filled 2015-01-15 (×4): qty 1

## 2015-01-15 MED ORDER — MORPHINE SULFATE 15 MG PO TABS
15.0000 mg | ORAL_TABLET | ORAL | Status: DC | PRN
Start: 2015-01-15 — End: 2015-01-18

## 2015-01-15 NOTE — Progress Notes (Signed)
NEUROLOGY ATTENDING NOTE I reviewed all notes from yesterday.  Palliative Care consult greatly appreciated.  I agree with current plan of care.  Will sign off at this time.   SIGNED BY: Dr. Elissa Hefty

## 2015-01-15 NOTE — Telephone Encounter (Signed)
Note patient admitted to hospital on Friday for acute stroke. We are facilitating transition to hospice & palliative care of [facility] while hospitalized.   Leeanne Rio, MD

## 2015-01-15 NOTE — Evaluation (Signed)
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   Clinical Impression   Pt limited this session due to level of arousal. Pt's family present at start of session and wants pt to be able to do what he can for himself. Wife states she encouraged pt to feed himself with R UE this am and he did. Educated pt briefly on positioning for L UE on pillow. Will follow for caregiver education and training and progress ADL as pt able to tolerate.    Follow Up Recommendations  Other (comment) (appears plan is home with hospice.)    Equipment Recommendations  3 in 1 bedside comode    Recommendations for Other Services       Precautions / Restrictions Precautions Precautions: Fall Precaution Comments: poor proprioception Lt Restrictions Weight Bearing Restrictions: No Other Position/Activity Restrictions: poor trunk control      Mobility Bed Mobility               General bed mobility comments: not alert enough to safely attempt OOB at this time.   Transfers                      Balance                                            ADL Overall ADL's : Needs assistance/impaired     Grooming: Wash/dry face;Set up;Bed level Grooming Details (indicate cue type and reason): increased time.                                General ADL Comments: Pt sleeping upon OT arrival. Pt's spouse and daughter initially requesting to let pt sleep but pt opened eyes briefly and stated "yes" when wife asked about working with therapy. Discussed DME before family had to leave and they would like a 3in1 BSC. explained use of 3in1 as a shower chair also versus obtaining another seat if desired and wife states the 3in1 is ok as shower chair also. Pt opened eyes to command  but tended to keep eyes closed much of session. Pt did open eyes to wash his face with setup and increased time due to sleepiness. Educated pt on positioning for L UE and placed UE on pillow. Limited eval due to pt being so lethargic/sleepy.      Vision Additional Comments: not able to assess this session due to level of arousal.    Perception     Praxis      Pertinent Vitals/Pain Pain Assessment: No/denies pain     Hand Dominance Right   Extremity/Trunk Assessment Upper Extremity Assessment Upper Extremity Assessment: LUE deficits/detail;RUE deficits/detail RUE Deficits / Details: generalized weakness. grip is fair.  LUE Deficits / Details: no active movement noted.            Communication Communication Communication: Expressive difficulties   Cognition Arousal/Alertness: Lethargic Behavior During Therapy: WFL for tasks assessed/performed Overall Cognitive Status: Difficult to assess                     General Comments       Exercises  Shoulder Instructions      Home Living Family/patient expects to be discharged to:: Private residence Living Arrangements: Spouse/significant other Available Help at Discharge: Family;Available PRN/intermittently;Other (Comment) (wife works during day`) Type of Home: House Home Access: Stairs to enter Technical brewer of Steps: 20 Entrance Stairs-Rails: Right Home Layout: Multi-level Alternate Level Stairs-Number of Steps: 15 Alternate Level Stairs-Rails: Can reach both Bathroom Shower/Tub: Tub/shower unit;Walk-in shower   Bathroom Toilet: Standard     Home Equipment: Cane - single point;Tub bench   Additional Comments: enters via garage to primary level with 20 steps, another flight of stairs to bedroom.  He reports they are planning to move to one-level residence in near future.      Prior Functioning/Environment Level of Independence: Independent;Needs assistance    ADL's / Homemaking Assistance  Needed: wife states she prepared meals ahead of time for pt. She was assisting with set up of shower, etc. Pt able to use walker to go to the bathroom during the day. SHe reports he more frequently was not using the walker like he was supposed to.    Comments: retired Theme park manager    OT Diagnosis: Generalized weakness;Hemiplegia non-dominant side   OT Problem List: Decreased strength;Decreased activity tolerance;Impaired UE functional use   OT Treatment/Interventions: Self-care/ADL training;Patient/family education;Therapeutic activities;DME and/or AE instruction    OT Goals(Current goals can be found in the care plan section) Acute Rehab OT Goals Patient Stated Goal: to do what he can for himself. OT Goal Formulation: With family Time For Goal Achievement: 01/29/15 Potential to Achieve Goals: Good  OT Frequency: Min 2X/week   Barriers to D/C:            Co-evaluation              End of Session    Activity Tolerance: Patient limited by lethargy Patient left: in bed;with bed alarm set   Time: [id number] OT Time Calculation (min): 20 min Charges:  OT General Charges $OT Visit: 1 Procedure OT Evaluation $Initial OT Evaluation Tier I: 1 Procedure G-Codes:    Jules Schick  [id number] 01/15/2015, 1:15 PM

## 2015-01-15 NOTE — Progress Notes (Signed)
FMTS Attending & PCP Daily Note:  Chrisandra Netters MD Personal pager:  [phone] FPTS Service Pager:  [phone]  Resident note pending, will cosign when available.  S: patient reports pain well controlled today. Family still planning for discharge home with [facility]. Pt would like something to help with anxiety.   O: BP 159/91 mmHg  Pulse 60  Temp(Src) 98.9 F (37.2 C) (Oral)  Resp 18  Ht 6' (1.829 m)  Wt 132 lb 11.2 oz (60.192 kg)  BMI 17.99 kg/m2  SpO2 100%  Gen: cachectic male lying in bed, NAD Neuro: prominent left facial droop present, with some slurring of words related to motor dysfunction rather than aphasia. Left sided paralysis remains. Intermittently alert, then easily falls back asleep Resp: normal work of breathing.   A/P: 59 yo M hospice patient with history of end stage pancreatic cancer, presenting with acute CVA causing left facial droop and left sided hemiparesis. - SLP to see today & make further recommendations on safest dietary options. Again reiterated concept of comfort feeds, with associated risk of aspiration. Patient may choose what he would like to eat/drink. - consider trazodone if needed for sleep aid - opioid management per palliative care - plan for today is to continue present dose of MS contin, convert IV breakthrough pain medication to oral.  - ativan as needed for anxiety/agitation - referral has been made to [facility], family waiting for contact from them - anticipate discharge to home in the next ~2 days, once hospice set up and necessary equipment arranged at home - continue to provide family and patient with emotional support. Please contact me directly if patient's status changes or I can be of assistance in caring for [name].  Chrisandra Netters, MD [facility]  01/15/2015

## 2015-01-15 NOTE — Progress Notes (Signed)
Daily Progress Note   Patient Name: Ray [name]       Date: 01/15/2015 DOB: 1955-10-03  Age: 59 y.o. MRN#: [id number] Attending Physician: Leeanne Rio, MD Primary Care Physician: Chrisandra Netters, MD Admit Date: 01/13/2015  Reason for Consultation/Follow-up: Disposition, Establishing goals of care and Pain control  Subjective: Met with Ray [name], his wife, his daughter Onalee Hua, and Dr. [name] this morning. He reports his pain remains well controlled on his current regimen. He denies any other complaints today.  We discussed plan of discharge with support of home hospice through hospice and palliative care of [facility].   Length of Stay: 2 days  Current Medications: Scheduled Meds:  . aspirin  325 mg Oral Daily  . DULoxetine  60 mg Oral Daily  . morphine  30 mg Oral 3 times per day  . pantoprazole (PROTONIX) IV  40 mg Intravenous QHS  . polyethylene glycol  17 g Oral Daily    Continuous Infusions: . sodium chloride 0.9 % 1,000 mL infusion 100 mL/hr at 01/15/15 0529    PRN Meds: LORazepam, morphine, morphine injection  Physical Exam: Physical Exam    Constitutional: No distress.  Elderly cachectic male, in bed in no distress  Eyes: Conjunctivae are normal. No scleral icterus.  Neck: No JVD present.  Cardiovascular: Normal rate and regular rhythm.  Respiratory: No stridor. No respiratory distress. He has no wheezes.  GI: He exhibits no distension. There is no tenderness.  Musculoskeletal: He exhibits edema.  Neurological: He is alert. A cranial nerve deficit is present.  Skin: Skin is warm and dry. He is not diaphoretic.            Vital Signs: BP 124/80 mmHg  Pulse 79  Temp(Src) 98.2 F (36.8 C) (Oral)  Resp 18  Ht 6' (1.829 m)  Wt 60.192 kg (132 lb  11.2 oz)  BMI 17.99 kg/m2  SpO2 100% SpO2: SpO2: 100 % O2 Device: O2 Device: Not Delivered O2 Flow Rate:    Intake/output summary: No intake or output data in the 24 hours ending 01/15/15 1045 LBM: Last BM Date: 01/14/15 Baseline Weight: Weight: 60.192 kg (132 lb 11.2 oz) Most recent weight: Weight: 60.192 kg (132 lb 11.2 oz)       Palliative Assessment/Data: Flowsheet Rows  Most Recent Value   Intake Tab    Referral Department  Hospitalist   Unit at Time of Referral  Cardiac/Telemetry Unit   Palliative Care Primary Diagnosis  Cancer   Date Notified  01/13/15   Palliative Care Type  New Palliative care   Date of Admission  01/13/15   Date first seen by Palliative Care  01/13/15   # of days Palliative referral response time  0 Day(s)   # of days IP prior to Palliative referral  0   Clinical Assessment    Palliative Performance Scale Score  50%   Pain Max last 24 hours  4   Pain Min Last 24 hours  0   Psychosocial & Spiritual Assessment    Palliative Care Outcomes    Patient/Family meeting held?  Yes   Who was at the meeting?  Patient, wife, daughter      Additional Data Reviewed: CBC    Component Value Date/Time   WBC 10.5 01/14/2015 0523   WBC 4.3 11/15/2014 1309   RBC 3.16* 01/14/2015 0523   RBC 4.94 11/15/2014 1309   HGB 9.0* 01/14/2015 0523   HGB 13.4 11/15/2014 1309   HGB 13.5* 06/08/2013 1445   HCT 26.9* 01/14/2015 0523   HCT 38.8 11/15/2014 1309   PLT 51* 01/14/2015 0523   PLT 140 Occ Large & giant platelets 11/15/2014 1309   MCV 85.1 01/14/2015 0523   MCV 78.5* 11/15/2014 1309   MCH 28.5 01/14/2015 0523   MCH 27.1* 11/15/2014 1309   MCHC 33.5 01/14/2015 0523   MCHC 34.5 11/15/2014 1309   RDW 16.4* 01/14/2015 0523   RDW 17.5* 11/15/2014 1309   LYMPHSABS 1.3 01/13/2015 0834   LYMPHSABS 0.4* 11/15/2014 1309   MONOABS 1.2* 01/13/2015 0834   MONOABS 0.4 11/15/2014 1309   EOSABS 0.1 01/13/2015 0834   EOSABS 0.0 11/15/2014 1309   BASOSABS 0.0  01/13/2015 0834   BASOSABS 0.0 11/15/2014 1309    CMP     Component Value Date/Time   NA 139 01/14/2015 0523   NA 140 11/15/2014 1310   K 3.3* 01/14/2015 0523   K 3.7 11/15/2014 1310   CL 102 01/14/2015 0523   CO2 26 01/14/2015 0523   CO2 23 11/15/2014 1310   GLUCOSE 93 01/14/2015 0523   GLUCOSE 132 11/15/2014 1310   BUN 8 01/14/2015 0523   BUN 9.8 11/15/2014 1310   CREATININE 0.85 01/14/2015 0523   CREATININE 1.0 11/15/2014 1310   CREATININE 1.09 01/21/2011 1042   CALCIUM 8.3* 01/14/2015 0523   CALCIUM 9.8 11/15/2014 1310   PROT 5.5* 01/14/2015 0523   PROT 7.4 11/15/2014 1310   ALBUMIN 2.9* 01/14/2015 0523   ALBUMIN 4.3 11/15/2014 1310   AST 28 01/14/2015 0523   AST 13 11/15/2014 1310   ALT 14* 01/14/2015 0523   ALT <9 11/15/2014 1310   ALKPHOS 267* 01/14/2015 0523   ALKPHOS 85 11/15/2014 1310   BILITOT 2.0* 01/14/2015 0523   BILITOT 0.68 11/15/2014 1310   GFRNONAA >60 01/14/2015 0523   GFRAA >60 01/14/2015 0523       Problem List:  Patient Active Problem List   Diagnosis Date Noted  . Hospice care patient   . CVA (cerebral infarction) 01/13/2015  . Stroke (cerebrum) ([place]) 01/13/2015  . Cerebral infarction due to unspecified mechanism   . Left-sided weakness   . Malignant neoplasm of pancreas ([place])   . Goals of care, counseling/discussion 12/06/2014  . Palliative care encounter 04/19/2014  . Cancer associated pain  04/19/2014  . Cancer related pain 04/05/2014  . Depression 11/26/2013  . Hx of pancreatitis 11/05/2013  . Protein-calorie malnutrition, severe ([place]) 08/25/2013  . Intractable abdominal pain 08/03/2013  . Dehydration 08/03/2013  . Abdominal discomfort 08/03/2013  . Malignant neoplasm of head of pancreas ([place]) 07/30/2013  . Hematuria 06/01/2013  . Anticoagulated 06/01/2013  . 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 Unspecified constipation 04/21/2013  . GERD (gastroesophageal reflux disease) 04/21/2013  . Chronic postoperative pain 04/14/2012    . Spinal stenosis in cervical region 04/14/2012  . Chronic left sacroiliac joint pain 04/14/2012  . Right lumbar radiculitis 04/14/2012  . Abnormal MRI, lumbar spine 02/17/2012  . Low back pain 12/27/2011  . 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 Overweight(278.02) 01/21/2011  . Right leg pain 10/21/2010  . HYPERGLYCEMIA, FASTING 01/22/2010  . UNSPECIFIED ANEMIA 07/05/2008  . HIP FRACTURE, RIGHT 07/05/2008  . HEMARTHROSIS 11/11/2007  . GANGLION, TENDON SHEATH 09/30/2006  . Gout 04/03/2006  . Hypertension 04/03/2006  . RAYNAUDS SYNDROME 04/03/2006  . REFLUX ESOPHAGITIS 04/03/2006     Palliative Care Assessment & Plan    1.Code Status:  DNR    Code Status Orders        Start     Ordered   01/13/15 1309  Do not attempt resuscitation (DNR)   Continuous    Question Answer Comment  In the event of cardiac or respiratory ARREST Do not call a "code blue"   In the event of cardiac or respiratory ARREST Do not perform Intubation, CPR, defibrillation or ACLS   In the event of cardiac or respiratory ARREST Use medication by any route, position, wound care, and other measures to relive pain and suffering. May use oxygen, suction and manual treatment of airway obstruction as needed for comfort.      01/13/15 1308    Advance Directive Documentation        Most Recent Value   Type of Advance Directive  Healthcare Power of Attorney, Living will   Pre-existing out of facility DNR order (yellow form or pink MOST form)     "MOST" Form in Place?         2. Goals of Care/Additional Recommendations:  His goal is for discharge home with hospice support.  3. Symptom Management:      1. Pain: Reports pain is currently well controlled on MS Contin 30 mg 3 times a day. Will continue same for long acting. We'll also plan for addition of MSIR 15 mg every 3 hours as needed. We'll also leave on IV morphine for use as backup if MSIR is ineffective. We will need to continue  to titrate his pain medication during this admission and also on discharge. 2. Anxiety: Currently on Cymbalta. Agree with addition of Ativan as needed.  4. Palliative Prophylaxis:   Aspiration, Frequent Pain Assessment and Turn Reposition  Prognosis: Likely weeks to months based upon clinical condition at this time. However, he is at high risk for acute decompensation with risk of recurrent stroke or acute aspiration event in addition to the prior complications from his advanced cancer. With pancreatic cancer complicated by new stroke, his overall prognosis would be less than 6 months and he will be well served by hospice support.  Discharge Planning: Home with Hospice. He would like to enroll with [facility] on discharge. Referral has been placed. Await reevaluation by speech therapy and enrollment in hospice. He will need to have DME deliver prior to  discharge. Likely discharge early next week.   Care plan was discussed with patient and his family, Dr. [name], and Dr. Sherril Cong  Thank you for allowing the Palliative Medicine Team to assist in the care of this patient.   Time In: 0910 Time Out: 0935 Total Time 25 Prolonged Time Billed  no         Micheline Rough, MD  01/15/2015, 10:45 AM  Please contact Palliative Medicine Team phone at [phone] for questions and concerns.

## 2015-01-15 NOTE — Progress Notes (Signed)
Speech Language Pathology Treatment: Dysphagia  Patient Details Name: Ray [name] MRN: [id number] DOB: 10/26/1955 Today's Date: 01/15/2015 Time: 1700-1720 SLP Time Calculation (min) (ACUTE ONLY): 20 min  Assessment / Plan / Recommendation Clinical Impression  Pt was seen for dysphagia therapy.  Dinner tray was present, and wife was assisting him with self-feeding.  Pt required min-mod verbal/tactile cues to minimize oral spilling, maintain bolus on right side of mouth, and attend to left side given sensorimotor deficits. He was reluctant to eat the dysphagia 2 items on his tray.  Trials of thin liquids continued to elicit immediate cough, likely a sign of aspiration given degree of cranial nerve involvement.  Session ended as pt became sleepy and pushed away tray.  Spoke with Ray [name] re: the plan to D/C home with hospice; the probability of intermittent aspiration despite our efforts at careful eating and diet modifications; the focus on Ray [name] eating foods/drinking liquids that bring him pleasure.  She agrees that an MBS would not likely change the direction of his care.  For now, recommend continuing Dysphagia 2, nectars.  After oral care, provide small sips of liquids of his choice, ice chips.  SLP will f/u for further education, then likely sign off.  Spoke with Dr. Domingo [name] earlier today about this patient.    HPI HPI: Ray [name] is a 59 y.o. male presenting with left sided weakness since approximately 7:15 AM, consistent with stroke . PMH is significant for pancreatic cancer, splenic vein thrombosis. 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 No prior history of dysphagia/  Followed by Palliative Care.  Plan is to D/C home with hospice secondary to advanced cancer,       SLP Plan  Continue with current plan of care     Recommendations  Diet recommendations: Dysphagia 2 (fine chop);Nectar-thick liquid;Thin liquid Liquids provided via:  Cup Medication Administration: Whole meds with puree Supervision: Patient able to self feed;Staff to assist with self feeding Compensations: Slow rate;Small sips/bites;Lingual sweep for clearance of pocketing Postural Changes and/or Swallow Maneuvers: Seated upright 90 degrees              Oral Care Recommendations: Oral care BID Plan: Continue with current plan of care   Ray [name] 01/15/2015, 5:29 PM  Ray [name], [place] CCC/SLP Pager [phone]

## 2015-01-15 NOTE — Progress Notes (Signed)
Family Medicine Teaching Service Daily Progress Note Intern Pager: [phone]  Patient name: Ray [name] Medical record number: [id number] Date of birth: 11-21-1955 Age: 59 y.o. Gender: male  Primary Care Provider: Chrisandra Netters, MD Consultants: neurology, palliative care Code Status: DNR  Pt Overview and Major Events to Date:  12/9 - admitted with R-MCA stroke  Assessment and Plan: Ray [name] is a 58 y.o. male presenting with left sided weakness since approximately 7:15 AM, consistent with stroke . 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 Stroke in the setting of Xarelto use. Will not pursue aggressive work-up or treatment as pt is on hospice related to his end-stage pancreatic cancer  - PT/OT/SLP - no rehab given [place] discussion, will await further recs for diet as they elect to comply with these for now - Palliative care consulted - plan for d/c home with [facility] - aspirin 325, d/c Xarelto  - neurology following, appreciate recs  Pancreatic Cancer- Diagnosed in 05/2013, s/p chemotherapy and radiation with completion of radiation on 11/25/2014. Oncologist is Dr [name]. He has been made DNR and is receiving home hospice care. He does have significant chronic cancer related pain for which he takes MSIR and MS contin - MS contin 30mg  TID, MSIR 15mg  Q4 prn, IV morphine for breakthrough (titrate up as needed) - Family would like to transition him to [facility] [place] as they are unhappy with his current hospice provider, care managemnt to facilitate this  Depression- Stable - Will continue home cymbalta 60 mg qD - consider starting ativan as needed. He has taken it in past prior to procedures  Hypertension- Stable without antihypertensives - will follow closely and treat as needed  FEN/GI: dysphagia 2 with nectar thick liquids, MIVF 100 cc/hr NS, protonix IV  Prophylaxis:  SCDs  Disposition: home hospice   Subjective:  Pt reports trouble sleeping overnight due to anxiety. Denies pain.   Objective: Temp:  [98.2 F (36.8 C)-100.2 F (37.9 C)] 98.7 F (37.1 C) (12/11 0634) Pulse Rate:  [53-85] 57 (12/11 0634) Resp:  [16-18] 16 (12/11 0634) BP: (131-153)/(71-90) 131/71 mmHg (12/11 0634) SpO2:  [100 %] 100 % (12/11 [id number]) Physical Exam: General: NAD, cachectic, lying in bed HEENT: PERRL, normal oropharynx, MMM, Cardiovascular: RRR, no murmurs auscultated Respiratory: CTAB, normal WOB Abdomen: soft, non tender, + BS MSK: decreased muscle bulk diffusely Skin: diffuse dry skin with flaking Psych: normal mood and affect  Laboratory:  Recent Labs Lab 01/13/15 0834 01/13/15 0841 01/14/15 0523  WBC 10.9*  --  10.5  HGB 10.4* 11.9* 9.0*  HCT 31.1* 35.0* 26.9*  PLT 51*  --  51*    Recent Labs Lab 01/13/15 0834 01/13/15 0841 01/14/15 0523  NA 136 136 139  K 3.6 3.4* 3.3*  CL 99* 95* 102  CO2 28  --  26  BUN 6 7 8   CREATININE 0.98 0.90 0.85  CALCIUM 8.4*  --  8.3*  PROT 5.7*  --  5.5*  BILITOT 1.9*  --  2.0*  ALKPHOS 261*  --  267*  ALT 15*  --  14*  AST 26  --  28  GLUCOSE 95 88 93    Imaging/Diagnostic Tests: Ct Angio Head W/cm &/or Wo Cm  01/13/2015  CLINICAL DATA:  Sudden onset of left-sided weakness. Last seen normal at 7:15 a.m. 01/13/2015. History of pancreatic cancer. EXAM: CT ANGIOGRAPHY HEAD AND NECK TECHNIQUE: Multidetector CT imaging  of the head and neck was performed using the standard protocol during bolus administration of intravenous contrast. Multiplanar CT image reconstructions and MIPs were obtained to evaluate the vascular anatomy. Carotid stenosis measurements (when applicable) are obtained utilizing NASCET criteria, using the distal internal carotid diameter as the denominator. CONTRAST:  23mL OMNIPAQUE IOHEXOL 350 MG/ML SOLN. A total of 90 mL Omnipaque was given, with 40 mL used for perfusion. COMPARISON:  CT head  earlier today. CT perfusion reported separately. FINDINGS: CT HEAD Reported separately at 0841 hours.  No hemorrhage. CTA NECK Aortic arch: Standard branching. Imaged portion shows no evidence of aneurysm or dissection. No significant stenosis of the major arch vessel origins. Right carotid system: No evidence of dissection, stenosis (50% or greater) or occlusion. Left carotid system: No evidence of dissection, stenosis (50% or greater) or occlusion. Vertebral arteries: RIGHT vertebral dominant without proximal disease. No evidence for dissection or occlusion. 50% ostial stenosis at the origin of the smaller LEFT vertebral. No subclavian disease of significance. Nonvascular soft tissues: Port-A-Cath good position. Severe spondylosis. No visible lung metastases. Considerable degenerative change at C1-C2. CTA HEAD Anterior circulation: Minor BILATERAL cavernous carotid atherosclerotic calcifications. Occluded RIGHT M1 MCA. This occlusion begins 5 mm from the RIGHT ICA terminus. There is preserved flow into the RIGHT MCA M2 and M3 branches, perhaps around the clot or via pial collaterals. No other anterior circulation disease of significance. No significant stenosis, proximal occlusion, aneurysm, or vascular malformation. Posterior circulation: RIGHT vertebral dominant. No significant stenosis, proximal occlusion, aneurysm, or vascular malformation. Venous sinuses: As permitted by contrast timing, patent. Anatomic variants: None of significance. Delayed phase:   No abnormal intracranial enhancement. IMPRESSION: Proximal large vessel occlusion RIGHT M1 MCA. Some flow is preserved into the RIGHT MCA M2 and M3 segments. No other intracranial or extracranial stenosis or occlusion of significance. Ordering provider was paged with these results at 9:09 a.m. while the study was in progress. Electronically Signed   By: Staci Righter M.D.   On: 01/13/2015 10:42   Ct Head Wo Contrast  01/13/2015  CLINICAL DATA:  Code stroke.   Left-sided weakness. EXAM: CT HEAD WITHOUT CONTRAST TECHNIQUE: Contiguous axial images were obtained from the base of the skull through the vertex without intravenous contrast. COMPARISON:  None FINDINGS: The brainstem and cerebellum appear normal. Cerebral hemispheres show areas of low-density throughout the deep white matter consistent with chronic small vessel disease. No sign of acute cortical infarction. No mass lesion, hemorrhage, hydrocephalus or extra-axial collection. No calvarial abnormality. Sinuses are clear. IMPRESSION: Low-density in the cerebral hemispheric white matter bilaterally consistent with chronic small vessel disease, premature for age. No identifiable acute infarction. Acute infarction could be hidden amongst the extensive chronic changes. I am attempting to call  report. Electronically Signed   By: Nelson Chimes M.D.   On: 01/13/2015 08:55   Ct Angio Neck W/cm &/or Wo/cm  01/13/2015  CLINICAL DATA:  Sudden onset of left-sided weakness. Last seen normal at 7:15 a.m. 01/13/2015. History of pancreatic cancer. EXAM: CT ANGIOGRAPHY HEAD AND NECK TECHNIQUE: Multidetector CT imaging of the head and neck was performed using the standard protocol during bolus administration of intravenous contrast. Multiplanar CT image reconstructions and MIPs were obtained to evaluate the vascular anatomy. Carotid stenosis measurements (when applicable) are obtained utilizing NASCET criteria, using the distal internal carotid diameter as the denominator. CONTRAST:  29mL OMNIPAQUE IOHEXOL 350 MG/ML SOLN. A total of 90 mL Omnipaque was given, with 40 mL used for perfusion. COMPARISON:  CT  head earlier today. CT perfusion reported separately. FINDINGS: CT HEAD Reported separately at 0841 hours.  No hemorrhage. CTA NECK Aortic arch: Standard branching. Imaged portion shows no evidence of aneurysm or dissection. No significant stenosis of the major arch vessel origins. Right carotid system: No evidence of dissection,  stenosis (50% or greater) or occlusion. Left carotid system: No evidence of dissection, stenosis (50% or greater) or occlusion. Vertebral arteries: RIGHT vertebral dominant without proximal disease. No evidence for dissection or occlusion. 50% ostial stenosis at the origin of the smaller LEFT vertebral. No subclavian disease of significance. Nonvascular soft tissues: Port-A-Cath good position. Severe spondylosis. No visible lung metastases. Considerable degenerative change at C1-C2. CTA HEAD Anterior circulation: Minor BILATERAL cavernous carotid atherosclerotic calcifications. Occluded RIGHT M1 MCA. This occlusion begins 5 mm from the RIGHT ICA terminus. There is preserved flow into the RIGHT MCA M2 and M3 branches, perhaps around the clot or via pial collaterals. No other anterior circulation disease of significance. No significant stenosis, proximal occlusion, aneurysm, or vascular malformation. Posterior circulation: RIGHT vertebral dominant. No significant stenosis, proximal occlusion, aneurysm, or vascular malformation. Venous sinuses: As permitted by contrast timing, patent. Anatomic variants: None of significance. Delayed phase:   No abnormal intracranial enhancement. IMPRESSION: Proximal large vessel occlusion RIGHT M1 MCA. Some flow is preserved into the RIGHT MCA M2 and M3 segments. No other intracranial or extracranial stenosis or occlusion of significance. Ordering provider was paged with these results at 9:09 a.m. while the study was in progress. Electronically Signed   By: Staci Righter M.D.   On: 01/13/2015 10:42   Ct Cerebral Perfusion W/cm  01/13/2015  CLINICAL DATA:  Sudden onset of left-sided weakness. EXAM: CT CEREBRAL PERFUSION WITH CONTRAST TECHNIQUE: Sixteen contiguous slices, 5 mm thick, from the middle cranial fossa through the convexity were obtained during the bolus infusion of 40 mL Isovue 370. Post processing software was used to analyze cerebral blood volume, cerebral blood flow,  mean transit time, and time to peak enhancement. CONTRAST:  11mL OMNIPAQUE IOHEXOL 350 MG/ML SOLN COMPARISON:  Noncontrast CT head performed earlier in the day. CTA head neck reported separately. FINDINGS: There is a proximal M1 occlusion on the RIGHT resulting in significant reduction of CBF, prolonged MTT, and delayed TTP throughout much of the RIGHT MCA territory, including the frontal, parietal, temporal, and insular regions. There is sparing of the basal ganglia, including the lentiform nucleus. Cerebral blood volume, however, is normal throughout the affected areas indicating a significant mismatch or penumbra. IMPRESSION: Abnormal CT perfusion scan consistent with a large ischemic penumbra in this patient with a proximal RIGHT MCA M1 large vessel occlusion. The ordering provider was paged with these results at 9:09 a.m. while the study was still in progress. Electronically Signed   By: Staci Righter M.D.   On: 01/13/2015 10:26    Frazier Richards, MD 01/15/2015, 8:34 AM PGY-3, [place] Intern pager: [phone], text pages welcome

## 2015-01-15 NOTE — Progress Notes (Addendum)
Patients potasium is trending down overall he is in better spirits and rested comfortable last night, patient still denying pain, will continue to monitor.

## 2015-01-16 ENCOUNTER — Telehealth: Payer: Self-pay | Admitting: Family Medicine

## 2015-01-16 DIAGNOSIS — R1310 Dysphagia, unspecified: Secondary | ICD-10-CM

## 2015-01-16 LAB — HEMOGLOBIN A1C
Hgb A1c MFr Bld: 5 % (ref 4.8–5.6)
Mean Plasma Glucose: 97 mg/dL

## 2015-01-16 MED ORDER — TRAZODONE HCL 50 MG PO TABS: 50.0000 mg | ORAL_TABLET | Freq: Every evening | ORAL | Status: DC | PRN

## 2015-01-16 MED ORDER — LORAZEPAM 1 MG PO TABS
1.0000 mg | ORAL_TABLET | ORAL | Status: DC | PRN
  Administered 2015-01-17 – 2015-01-18 (×3): 1 mg via ORAL
  Filled 2015-01-16 (×3): qty 1

## 2015-01-16 NOTE — Care Management Note (Signed)
Case Management Note  Patient Details  Name: Ray [name] MRN: [id number] Date of Birth: January 12, 1956  Subjective/Objective:   Patient admitted with CVA. Patient also with diagnosis of pancreatic cancer. He is from home with his wife.                  Action/Plan: Plan is for patient to return home with [facility]osp[facility]. Per Palliative Care note the family wants [facility]. CM notified [facility] and their liaison will see the patient and family today. CM will continue to follow for discharge needs.   Expected Discharge Date:                  Expected Discharge Plan:  Home w Hospice Care  In-House Referral:     Discharge planning Services  CM Consult  Post Acute Care Choice:    Choice offered to:     DME Arranged:    DME Agency:     HH Arranged:    [place] Agency:  [facility]  Status of Service:  In process, will continue to follow  Medicare Important Message Given:    Date Medicare IM Given:    Medicare IM give by:    Date Additional Medicare IM Given:    Additional Medicare Important Message give by:     If discussed at [place] of Stay Meetings, dates discussed:    Additional Comments:  Pollie Friar, RN 01/16/2015, 11:08 AM

## 2015-01-16 NOTE — Progress Notes (Signed)
Family Medicine Teaching Service Daily Progress Note Intern Pager: [phone]  Patient name: Ray [name] Medical record number: [id number] Date of birth: 25-May-1955 Age: 59 y.o. Gender: male  Primary Care Provider: Chrisandra Netters, MD Consultants: neurology, palliative care Code Status: DNR  Pt Overview and Major Events to Date:  12/9 - admitted with R-MCA stroke 12/11 - neurology signed off  Assessment and Plan: Ray [name] is a 59 y.o. male presenting with left sided weakness since approximately 7:15 AM, consistent with stroke. 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 Stroke in the setting of Xarelto use. Will not pursue aggressive work-up or treatment as pt is on hospice related to his end-stage pancreatic cancer. ECHO not performed as would not change management.   - PT/OT/SLP - no rehab given [place] discussion, will await further recs for diet as they elect to comply with these for now - Palliative care consulted - plan for d/c home with [facility] - Continue aspirin 325, d/c Xarelto   Pancreatic Cancer- Diagnosed in 05/2013, s/p chemotherapy and radiation with completion of radiation on 11/25/2014. Oncologist is Dr [name]. He has been made DNR and is receiving home hospice care. He does have significant chronic cancer related pain for which he takes MSIR and MS contin - MS contin 30mg  TID, MSIR 15mg  Q4 prn, IV morphine for breakthrough (titrate up as needed) - Family would like to transition him to [facility] [place]; care managemnt to facilitate this  Depression- Stable - Will continue home cymbalta 60 mg qD - consider starting ativan as needed. He has taken it in past prior to procedures  Hypertension- Stable without antihypertensives - will follow closely and treat as needed  FEN/GI: dysphagia 2 with nectar thick liquids, MIVF 100 cc/hr NS, protonix IV  Prophylaxis:  SCDs  Disposition: home with hospice once DME obtained (hospital bed, shower chair, lift for bed)   Subjective:  - Patient sleepy this morning. Wife says he had an active day yesterday with many visitors. He was able to watch football and had a good appetite. - Can tolerate small sips of liquids without coughing.  - Wife says his pain seems better controlled today - Wife would like to speak to [facility] in a meeting format. Provided her cell phone number of [phone] to arrange.  Objective: Temp:  [98.2 F (36.8 C)-99.4 F (37.4 C)] 99.4 F (37.4 C) (12/12 0645) Pulse Rate:  [60-79] 68 (12/12 0645) Resp:  [17-18] 18 (12/12 0645) BP: (124-166)/(80-92) 166/92 mmHg (12/12 0645) SpO2:  [98 %-100 %] 98 % (12/12 0645) Physical Exam: General: NAD, cachectic, sleeping sitting up in recliner  HEENT: PERRL, normal oropharynx, MMM Cardiovascular: RRR, no murmurs auscultated Respiratory: CTAB, normal WOB Abdomen: soft, non tender, + BS MSK: decreased muscle bulk diffusely Neuro: Decreased facial sensation on left. Left sided facial droop. Unable to move left limbs. Decreased strength of right upper and lower extremities, 3/5 and 2/5 respectively (but wife said he wasn't giving it his best effort.)   Laboratory:  Recent Labs Lab 01/13/15 0834 01/13/15 0841 01/14/15 0523  WBC 10.9*  --  10.5  HGB 10.4* 11.9* 9.0*  HCT 31.1* 35.0* 26.9*  PLT 51*  --  51*    Recent Labs Lab 01/13/15 0834 01/13/15 0841 01/14/15 0523  NA 136 136 139  K 3.6 3.4* 3.3*  CL 99* 95* 102  CO2 28  --  26  BUN 6 7 8   CREATININE 0.98 0.90 0.85  CALCIUM 8.4*  --  8.3*  PROT 5.7*  --  5.5*  BILITOT 1.9*  --  2.0*  ALKPHOS 261*  --  267*  ALT 15*  --  14*  AST 26  --  28  GLUCOSE 95 88 93    Imaging/Diagnostic Tests: Ct Angio Head W/cm &/or Wo Cm  01/13/2015  CLINICAL DATA:  Sudden onset of left-sided weakness. Last seen normal at 7:15 a.m. 01/13/2015. History of pancreatic cancer.  EXAM: CT ANGIOGRAPHY HEAD AND NECK TECHNIQUE: Multidetector CT imaging of the head and neck was performed using the standard protocol during bolus administration of intravenous contrast. Multiplanar CT image reconstructions and MIPs were obtained to evaluate the vascular anatomy. Carotid stenosis measurements (when applicable) are obtained utilizing NASCET criteria, using the distal internal carotid diameter as the denominator. CONTRAST:  31mL OMNIPAQUE IOHEXOL 350 MG/ML SOLN. A total of 90 mL Omnipaque was given, with 40 mL used for perfusion. COMPARISON:  CT head earlier today. CT perfusion reported separately. FINDINGS: CT HEAD Reported separately at 0841 hours.  No hemorrhage. CTA NECK Aortic arch: Standard branching. Imaged portion shows no evidence of aneurysm or dissection. No significant stenosis of the major arch vessel origins. Right carotid system: No evidence of dissection, stenosis (50% or greater) or occlusion. Left carotid system: No evidence of dissection, stenosis (50% or greater) or occlusion. Vertebral arteries: RIGHT vertebral dominant without proximal disease. No evidence for dissection or occlusion. 50% ostial stenosis at the origin of the smaller LEFT vertebral. No subclavian disease of significance. Nonvascular soft tissues: Port-A-Cath good position. Severe spondylosis. No visible lung metastases. Considerable degenerative change at C1-C2. CTA HEAD Anterior circulation: Minor BILATERAL cavernous carotid atherosclerotic calcifications. Occluded RIGHT M1 MCA. This occlusion begins 5 mm from the RIGHT ICA terminus. There is preserved flow into the RIGHT MCA M2 and M3 branches, perhaps around the clot or via pial collaterals. No other anterior circulation disease of significance. No significant stenosis, proximal occlusion, aneurysm, or vascular malformation. Posterior circulation: RIGHT vertebral dominant. No significant stenosis, proximal occlusion, aneurysm, or vascular malformation. Venous  sinuses: As permitted by contrast timing, patent. Anatomic variants: None of significance. Delayed phase:   No abnormal intracranial enhancement. IMPRESSION: Proximal large vessel occlusion RIGHT M1 MCA. Some flow is preserved into the RIGHT MCA M2 and M3 segments. No other intracranial or extracranial stenosis or occlusion of significance. Ordering provider was paged with these results at 9:09 a.m. while the study was in progress. Electronically Signed   By: Staci Righter M.D.   On: 01/13/2015 10:42   Ct Head Wo Contrast  01/13/2015  CLINICAL DATA:  Code stroke.  Left-sided weakness. EXAM: CT HEAD WITHOUT CONTRAST TECHNIQUE: Contiguous axial images were obtained from the base of the skull through the vertex without intravenous contrast. COMPARISON:  None FINDINGS: The brainstem and cerebellum appear normal. Cerebral hemispheres show areas of low-density throughout the deep white matter consistent with chronic small vessel disease. No sign of acute cortical infarction. No mass lesion, hemorrhage, hydrocephalus or extra-axial collection. No calvarial abnormality. Sinuses are clear. IMPRESSION: Low-density in the cerebral hemispheric white matter bilaterally consistent with chronic small vessel disease, premature for age. No identifiable acute infarction. Acute infarction could be hidden amongst the extensive chronic changes. I am attempting to call  report. Electronically Signed   By: Nelson Chimes M.D.   On: 01/13/2015 08:55   Ct Angio Neck W/cm &/or Wo/cm  01/13/2015  CLINICAL DATA:  Sudden onset of left-sided weakness. Last seen normal at 7:15 a.m. 01/13/2015. History of pancreatic cancer. EXAM: CT ANGIOGRAPHY HEAD AND NECK TECHNIQUE: Multidetector CT imaging of the head and neck was performed using the standard protocol during bolus administration of intravenous contrast. Multiplanar CT image reconstructions and MIPs were obtained to evaluate the vascular anatomy. Carotid stenosis measurements (when  applicable) are obtained utilizing NASCET criteria, using the distal internal carotid diameter as the denominator. CONTRAST:  9mL OMNIPAQUE IOHEXOL 350 MG/ML SOLN. A total of 90 mL Omnipaque was given, with 40 mL used for perfusion. COMPARISON:  CT head earlier today. CT perfusion reported separately. FINDINGS: CT HEAD Reported separately at 0841 hours.  No hemorrhage. CTA NECK Aortic arch: Standard branching. Imaged portion shows no evidence of aneurysm or dissection. No significant stenosis of the major arch vessel origins. Right carotid system: No evidence of dissection, stenosis (50% or greater) or occlusion. Left carotid system: No evidence of dissection, stenosis (50% or greater) or occlusion. Vertebral arteries: RIGHT vertebral dominant without proximal disease. No evidence for dissection or occlusion. 50% ostial stenosis at the origin of the smaller LEFT vertebral. No subclavian disease of significance. Nonvascular soft tissues: Port-A-Cath good position. Severe spondylosis. No visible lung metastases. Considerable degenerative change at C1-C2. CTA HEAD Anterior circulation: Minor BILATERAL cavernous carotid atherosclerotic calcifications. Occluded RIGHT M1 MCA. This occlusion begins 5 mm from the RIGHT ICA terminus. There is preserved flow into the RIGHT MCA M2 and M3 branches, perhaps around the clot or via pial collaterals. No other anterior circulation disease of significance. No significant stenosis, proximal occlusion, aneurysm, or vascular malformation. Posterior circulation: RIGHT vertebral dominant. No significant stenosis, proximal occlusion, aneurysm, or vascular malformation. Venous sinuses: As permitted by contrast timing, patent. Anatomic variants: None of significance. Delayed phase:   No abnormal intracranial enhancement. IMPRESSION: Proximal large vessel occlusion RIGHT M1 MCA. Some flow is preserved into the RIGHT MCA M2 and M3 segments. No other intracranial or extracranial stenosis or  occlusion of significance. Ordering provider was paged with these results at 9:09 a.m. while the study was in progress. Electronically Signed   By: Staci Righter M.D.   On: 01/13/2015 10:42   Ct Cerebral Perfusion W/cm  01/13/2015  CLINICAL DATA:  Sudden onset of left-sided weakness. EXAM: CT CEREBRAL PERFUSION WITH CONTRAST TECHNIQUE: Sixteen contiguous slices, 5 mm thick, from the middle cranial fossa through the convexity were obtained during the bolus infusion of 40 mL Isovue 370. Post processing software was used to analyze cerebral blood volume, cerebral blood flow, mean transit time, and time to peak enhancement. CONTRAST:  56mL OMNIPAQUE IOHEXOL 350 MG/ML SOLN COMPARISON:  Noncontrast CT head performed earlier in the day. CTA head neck reported separately. FINDINGS: There is a proximal M1 occlusion on the RIGHT resulting in significant reduction of CBF, prolonged MTT, and delayed TTP throughout much of the RIGHT MCA territory, including the frontal, parietal, temporal, and insular regions. There is sparing of the basal ganglia, including the lentiform nucleus. Cerebral blood volume, however, is normal throughout the affected areas indicating a significant mismatch or penumbra. IMPRESSION: Abnormal CT perfusion scan consistent with a large ischemic penumbra in this patient with a proximal RIGHT MCA M1 large vessel occlusion. The ordering provider was paged with these results at 9:09 a.m. while the study was still in progress. Electronically Signed   By: Staci Righter M.D.   On: 01/13/2015 10:26    Hillary Corinda Gubler, MD 01/16/2015, 8:46 AM PGY-1, [place] Intern  pager: [phone], text pages welcome

## 2015-01-16 NOTE — Progress Notes (Signed)
Daily Progress Note   Patient Name: Ray [name]       Date: 01/16/2015 DOB: 09-07-1955  Age: 59 y.o. MRN#: [id number] Attending Physician: Leeanne Rio, MD Primary Care Physician: Chrisandra Netters, MD Admit Date: 01/13/2015  Reason for Consultation/Follow-up: Disposition, Establishing goals of care and Pain control  Subjective: Met with Ray [name], his wife, his daughter Onalee Hua, and friend who is shaving him in bedside chair.  He denies complaints this morning.  We discussed plan of discharge with support of home hospice through hospice and palliative care of [facility].   Length of Stay: 3 days  Current Medications: Scheduled Meds:  . aspirin  325 mg Oral Daily  . DULoxetine  60 mg Oral Daily  . morphine  30 mg Oral 3 times per day  . pantoprazole (PROTONIX) IV  40 mg Intravenous QHS  . polyethylene glycol  17 g Oral Daily    Continuous Infusions: . sodium chloride 0.9 % 1,000 mL infusion 100 mL/hr at 01/15/15 1755    PRN Meds: LORazepam, morphine, morphine injection, traZODone  Physical Exam: Physical Exam    Constitutional: No distress.  Elderly cachectic male, sitting in bedside chair getting shave  Eyes: Conjunctivae are normal. No scleral icterus.  Neck: No JVD present.  Cardiovascular: Normal rate and regular rhythm.  Respiratory: No stridor. No respiratory distress. He has no wheezes.  GI: He exhibits no distension. There is no tenderness.  Musculoskeletal: He exhibits edema.  Neurological: He is alert. A cranial nerve deficit is present.  Skin: Skin is warm and dry. He is not diaphoretic.            Vital Signs: BP 140/90 mmHg  Pulse 84  Temp(Src) 99.2 F (37.3 C) (Oral)  Resp 20  Ht 6' (1.829 m)  Wt 60.192 kg (132 lb 11.2 oz)  BMI 17.99  kg/m2  SpO2 98% SpO2: SpO2: 98 % O2 Device: O2 Device: Not Delivered O2 Flow Rate:    Intake/output summary:   Intake/Output Summary (Last 24 hours) at 01/16/15 1019 Last data filed at 01/16/15 0809  Gross per 24 hour  Intake    120 ml  Output      0 ml  Net    120 ml   LBM: Last BM Date: 01/14/15 Baseline Weight: Weight: 60.192 kg (132 lb  11.2 oz) Most recent weight: Weight: 60.192 kg (132 lb 11.2 oz)       Palliative Assessment/Data: Flowsheet Rows        Most Recent Value   Intake Tab    Referral Department  Hospitalist   Unit at Time of Referral  Cardiac/Telemetry Unit   Palliative Care Primary Diagnosis  Cancer   Date Notified  01/13/15   Palliative Care Type  New Palliative care   Date of Admission  01/13/15   Date first seen by Palliative Care  01/13/15   # of days Palliative referral response time  0 Day(s)   # of days IP prior to Palliative referral  0   Clinical Assessment    Palliative Performance Scale Score  50%   Pain Max last 24 hours  4   Pain Min Last 24 hours  0   Psychosocial & Spiritual Assessment    Palliative Care Outcomes    Patient/Family meeting held?  Yes   Who was at the meeting?  Patient, wife, daughter      Additional Data Reviewed: CBC    Component Value Date/Time   WBC 10.5 01/14/2015 0523   WBC 4.3 11/15/2014 1309   RBC 3.16* 01/14/2015 0523   RBC 4.94 11/15/2014 1309   HGB 9.0* 01/14/2015 0523   HGB 13.4 11/15/2014 1309   HGB 13.5* 06/08/2013 1445   HCT 26.9* 01/14/2015 0523   HCT 38.8 11/15/2014 1309   PLT 51* 01/14/2015 0523   PLT 140 Occ Large & giant platelets 11/15/2014 1309   MCV 85.1 01/14/2015 0523   MCV 78.5* 11/15/2014 1309   MCH 28.5 01/14/2015 0523   MCH 27.1* 11/15/2014 1309   MCHC 33.5 01/14/2015 0523   MCHC 34.5 11/15/2014 1309   RDW 16.4* 01/14/2015 0523   RDW 17.5* 11/15/2014 1309   LYMPHSABS 1.3 01/13/2015 0834   LYMPHSABS 0.4* 11/15/2014 1309   MONOABS 1.2* 01/13/2015 0834   MONOABS 0.4  11/15/2014 1309   EOSABS 0.1 01/13/2015 0834   EOSABS 0.0 11/15/2014 1309   BASOSABS 0.0 01/13/2015 0834   BASOSABS 0.0 11/15/2014 1309    CMP     Component Value Date/Time   NA 139 01/14/2015 0523   NA 140 11/15/2014 1310   K 3.3* 01/14/2015 0523   K 3.7 11/15/2014 1310   CL 102 01/14/2015 0523   CO2 26 01/14/2015 0523   CO2 23 11/15/2014 1310   GLUCOSE 93 01/14/2015 0523   GLUCOSE 132 11/15/2014 1310   BUN 8 01/14/2015 0523   BUN 9.8 11/15/2014 1310   CREATININE 0.85 01/14/2015 0523   CREATININE 1.0 11/15/2014 1310   CREATININE 1.09 01/21/2011 1042   CALCIUM 8.3* 01/14/2015 0523   CALCIUM 9.8 11/15/2014 1310   PROT 5.5* 01/14/2015 0523   PROT 7.4 11/15/2014 1310   ALBUMIN 2.9* 01/14/2015 0523   ALBUMIN 4.3 11/15/2014 1310   AST 28 01/14/2015 0523   AST 13 11/15/2014 1310   ALT 14* 01/14/2015 0523   ALT <9 11/15/2014 1310   ALKPHOS 267* 01/14/2015 0523   ALKPHOS 85 11/15/2014 1310   BILITOT 2.0* 01/14/2015 0523   BILITOT 0.68 11/15/2014 1310   GFRNONAA >60 01/14/2015 0523   GFRAA >60 01/14/2015 0523       Problem List:  Patient Active Problem List   Diagnosis Date Noted  . Hospice care patient   . CVA (cerebral infarction) 01/13/2015  . Stroke (cerebrum) ([place]) 01/13/2015  . Cerebral infarction due to unspecified mechanism   .  Left-sided weakness   . Malignant neoplasm of pancreas ([place])   . Goals of care, counseling/discussion 12/06/2014  . Palliative care encounter 04/19/2014  . Cancer associated pain 04/19/2014  . Cancer related pain 04/05/2014  . Depression 11/26/2013  . Hx of pancreatitis 11/05/2013  . Protein-calorie malnutrition, severe ([place]) 08/25/2013  . Intractable abdominal pain 08/03/2013  . Dehydration 08/03/2013  . Abdominal discomfort 08/03/2013  . Malignant neoplasm of head of pancreas ([place]) 07/30/2013  . Hematuria 06/01/2013  . Anticoagulated 06/01/2013  . 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 Unspecified constipation 04/21/2013  .  GERD (gastroesophageal reflux disease) 04/21/2013  . Chronic postoperative pain 04/14/2012  . Spinal stenosis in cervical region 04/14/2012  . Chronic left sacroiliac joint pain 04/14/2012  . Right lumbar radiculitis 04/14/2012  . Abnormal MRI, lumbar spine 02/17/2012  . Low back pain 12/27/2011  . 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 Overweight(278.02) 01/21/2011  . Right leg pain 10/21/2010  . HYPERGLYCEMIA, FASTING 01/22/2010  . UNSPECIFIED ANEMIA 07/05/2008  . HIP FRACTURE, RIGHT 07/05/2008  . HEMARTHROSIS 11/11/2007  . GANGLION, TENDON SHEATH 09/30/2006  . Gout 04/03/2006  . Hypertension 04/03/2006  . RAYNAUDS SYNDROME 04/03/2006  . REFLUX ESOPHAGITIS 04/03/2006     Palliative Care Assessment & Plan    1.Code Status:  DNR    Code Status Orders        Start     Ordered   01/13/15 1309  Do not attempt resuscitation (DNR)   Continuous    Question Answer Comment  In the event of cardiac or respiratory ARREST Do not call a "code blue"   In the event of cardiac or respiratory ARREST Do not perform Intubation, CPR, defibrillation or ACLS   In the event of cardiac or respiratory ARREST Use medication by any route, position, wound care, and other measures to relive pain and suffering. May use oxygen, suction and manual treatment of airway obstruction as needed for comfort.      01/13/15 1308    Advance Directive Documentation        Most Recent Value   Type of Advance Directive  Healthcare Power of Attorney, Living will   Pre-existing out of facility DNR order (yellow form or pink MOST form)     "MOST" Form in Place?         2. Goals of Care/Additional Recommendations:  His goal is for discharge home with hospice support.  3. Symptom Management:      1. Pain: Reports pain is currently well controlled on MS Contin 30 mg 3 times a day. Will continue same for long acting. We'll also continue MSIR 15 mg every 3 hours as needed, but he has not been  using rescue doses.  IV morphine while inpatient for use as backup if MSIR is ineffective. We will need to continue to titrate his pain medication during this admission and also on discharge. 2. Anxiety: Currently on Cymbalta. Agree with addition of Ativan as needed. 3. Dysphagia with aspiration risk:  Current diet per speech recs.  Appreciate speech education and working with family to develop plan for feeding moving forward.   Patient and his family have been educated on aspiration and on option for feeding as patient desires for comfort. Wife reports that they may liberalize diet with understanding of risk once he is d/c home. 4. Palliative Prophylaxis:   Aspiration, Frequent Pain Assessment and Turn Reposition  Prognosis: Likely weeks to months based upon clinical condition at this time. However,  he is at high risk for acute decompensation with risk of recurrent stroke or acute aspiration event in addition to the prior complications from his advanced cancer. With pancreatic cancer complicated by new stroke, his overall prognosis would be less than 6 months and he will be well served by hospice support.  Discharge Planning: Home with Hospice. He would like to enroll with [facility] of [facility] on discharge. Referral has been placed.  He will need to have DME deliver prior to discharge. Likely discharge in next couple of days.   Care plan was discussed with patient and his family Thank you for allowing the Palliative Medicine Team to assist in the care of this patient.   Time In: 1000 Time Out: 1015 Total Time 15 Prolonged Time Billed  no         Micheline Rough, MD  01/16/2015, 10:19 AM  Please contact Palliative Medicine Team phone at [phone] for questions and concerns.

## 2015-01-16 NOTE — Discharge Summary (Signed)
[facility] Discharge Summary  Patient name: Ray [name] Medical record number: [id number] Date of birth: 14-Aug-1955 Age: 59 y.o. Gender: male Date of Admission: 01/13/2015  Date of Discharge: 01/18/15 Admitting Physician: Leeanne Rio, MD  Primary Care Provider: Chrisandra Netters, MD Consultants: Neurology, Palliative Care, SLP  Indication for Hospitalization: sudden-onset left hemiparesis  Discharge Diagnoses/Problem List:  Active Problems:   CVA (cerebral infarction)   Hospice care patient   End-stage pancreatic cancer  Disposition: Home with Hospice Support  Discharge Condition: Stable  Discharge Exam:  Blood pressure 156/85, pulse 61, temperature 98.6 F (37 C), temperature source Oral, resp. rate 18, height 6' (1.829 m), weight 132 lb 11.2 oz (60.192 kg), SpO2 100 %.  General: NAD, cachectic, sleeping with head elevated in bed HEENT: PERRL, normal oropharynx, MMM Cardiovascular: RRR, no murmurs auscultated Respiratory: CTAB, normal WOB Abdomen: soft, no TTP, no rebound or guarding, + BS MSK: decreased muscle bulk diffusely Neuro: Left sided facial droop. Worsening dysarthria. Flaccid left-sided hemiplegia. EOMI. Followed commands, wiggling his right toes and demonstrating hand grip of 4/5 Could not write his questions with his right hand but able to grip pen and scribble on paper. Decreased strength of right upper and lower extremities, 4/5. Easy to arouse but continued to fall asleep between questions.   Brief Hospital Course:  Ray [name] is a 59 y.o. male who presented with left sided weakness the morning of 01/13/15. PMH is significant for pancreatic cancer, splenic vein thrombosis, HTN, and depression.   He was found to have a right MCA thrombosis on CT Angio head, thought to be secondary to hypercoaguable state due to malignancy. IV t-PA was not an option, as patient was already on xarelto at home prior to admission. Xarelto  was discontinued for concern of hemorrhagic conversion, and aspirin was started. Aggressive work-up was not pursued,consistent with family wishes with patient already DNR and receiving home hospice care prior to admission. Family requested switch of hospice services to work instead with [facility], arranged though Care Management.   Prior to this CVA, patient was able to do most of his ADLs and used a walker. Now with flaccid left hemiparalysis. PT recommended wheelchair with cushion, hospital bed, bedside commode and hoyer lift, which all were to be delivered to patient's home by 01/18/15. Speech evaluated patient and recommended nectar thick diet (dysphagia 1) and taking small sips for comfort, with aspiration risk explained to family. SLP recommended oral care before and after PO intake, 24 hour supervision and suctioning at home.  For pain, he received MS Contin 30 mg BID and MSIR 15 mg q3h prn, with IV morphine 4 mg q2h prn. He was continued on home cymbalta with q4h prn ativan as needed for anxiety. At time of discharge, he only required the long-acting morphine with prn ativan. Home megace, prescribed for appetite stimulation, was not continued for increased risk of repeat stroke. Blood pressure was stable without anti-hypertensives, which were not continued upon discharge. Patient had hypokalemia to 3.3 but K-dur not continued upon discharge, as focus is on comfort.   Issues for Follow Up:  1. Family focused on comfort; Dr. [name] to serve as patient's attending at hospice, with hospice MD to help co-manage symptoms.   Significant Procedures: None  Significant Labs and Imaging:   Recent Labs Lab 01/13/15 0834 01/13/15 0841 01/14/15 0523  WBC 10.9*  --  10.5  HGB 10.4* 11.9* 9.0*  HCT 31.1* 35.0* 26.9*  PLT 51*  --  51*    Recent Labs Lab 01/13/15 0834 01/13/15 0841 01/14/15 0523  NA 136 136 139  K 3.6 3.4* 3.3*  CL 99* 95* 102  CO2 28  --  26   GLUCOSE 95 88 93  BUN 6 7 8   CREATININE 0.98 0.90 0.85  CALCIUM 8.4*  --  8.3*  ALKPHOS 261*  --  267*  AST 26  --  28  ALT 15*  --  14*  ALBUMIN 3.1*  --  2.9*   Lipid panel: cholesterol 143, triglycerides 35, LDL 78  i-stat troponin 0.65  Ethanol <5  INR 1.40 PT 17.2 APTT 29   Ct Angio Head W/cm &/or Wo Cm 01/13/2015  Proximal large vessel occlusion RIGHT M1 MCA. Some flow is preserved into the RIGHT MCA M2 and M3 segments. No other intracranial or extracranial stenosis or occlusion of significance.   Ct Head Wo Contrast 01/13/2015  Low-density in the cerebral hemispheric white matter bilaterally consistent with chronic small vessel disease, premature for age. No identifiable acute infarction. Acute infarction could be hidden amongst the extensive chronic changes.  Ct Angio Neck W/cm &/or Wo/cm 01/13/2015  Proximal large vessel occlusion RIGHT M1 MCA. Some flow is preserved into the RIGHT MCA M2 and M3 segments. No other intracranial or extracranial stenosis or occlusion of significance.   Ct Cerebral Perfusion W/cm 01/13/2015  Abnormal CT perfusion scan consistent with a large ischemic penumbra in this patient with a proximal RIGHT MCA M1 large vessel occlusion.   Results/Tests Pending at Time of Discharge: None  Discharge Medications:    Medication List    STOP taking these medications        amLODipine 10 MG tablet  Commonly known as:  NORVASC     potassium chloride SA 20 MEQ tablet  Commonly known as:  KLOR-CON M20     rivaroxaban 20 MG Tabs tablet  Commonly known as:  XARELTO      TAKE these medications        aspirin 325 MG tablet  Take 1 tablet (325 mg total) by mouth daily.     DULoxetine 60 MG capsule  Commonly known as:  CYMBALTA  Take 1 capsule (60 mg total) by mouth daily. TAKE 1 CAPSULE DAILY     LORazepam 1 MG tablet  Commonly known as:  ATIVAN  Take 1 tablet (1 mg total) by mouth every 4 (four) hours as needed for anxiety or  sedation.     morphine 15 MG tablet  Commonly known as:  MSIR  Take 1 tablet (15 mg total) by mouth every 3 (three) hours as needed for moderate pain or severe pain.     morphine 30 MG 12 hr tablet  Commonly known as:  MS CONTIN  Take 1 tablet (30 mg total) by mouth every 8 (eight) hours.     omeprazole 40 MG capsule  Commonly known as:  PRILOSEC  Take 1 capsule (40 mg total) by mouth daily.     Polyethyl Glycol-Propyl Glycol 0.4-0.3 % Soln  Commonly known as:  SYSTANE  Apply 1 drop to eye 3 (three) times daily as needed (dry eyes).     polyethylene glycol packet  Commonly known as:  MIRALAX / GLYCOLAX  Take 17 g by mouth daily.     prochlorperazine 10 MG tablet  Commonly known as:  COMPAZINE  Take 1 tablet (10 mg total) by mouth every 6 (six) hours as needed for nausea or vomiting.  RESOURCE THICKENUP CLEAR Powd  Take 1 g by mouth as needed.     sildenafil 50 MG tablet  Commonly known as:  VIAGRA  Take 1 tablet (50 mg total) by mouth as needed.     traZODone 50 MG tablet  Commonly known as:  DESYREL  Take 1 tablet (50 mg total) by mouth at bedtime as needed for sleep.        Discharge Instructions: Please refer to Patient Instructions section of EMR for full details.  Patient was counseled important signs and symptoms that should prompt return to medical care, changes in medications, dietary instructions, activity restrictions, and follow up appointments.   Follow-Up Appointments: As needed with hospice care.   Rogue Bussing, MD 01/18/2015, 11:40 AM PGY-1, [place]

## 2015-01-16 NOTE — Progress Notes (Signed)
Family Medicine Teaching Service PCP Social Visit  Stopped by patient's room tonight to visit with him. No family was present in the room, and he was resting with his eyes closed. He awoke to the sound of my voice. His speech is much more difficult to understand today. After much effort at trying to understand him, I determined that he was asking for more ativan and morphine. Spoke with his nurse, who will administer a dose of his breakthrough PO morphine. His speech seems more truly dysarthric today, rather than being just slurred from his left sided facial droop.  I also called and spoke with his wife, Ray [name]. [facility] is planning to arrange bringing a hospital bed to their house and will contact her either tonight or first thing in the morning. Ray [name] also felt like his speech seems more difficult to understand today, and is worried that his stroke has progressed. I advised that it may well have progressed further.   I am going to change his as needed ativan order to q4h prn, to ensure he can get this when he needs it. I will continue to support [name] and his family via regular contact as they go through this transition. Please let me know if I can be helpful in caring for him in any way.  Ray Netters, MD [place]  Pager [phone]

## 2015-01-16 NOTE — Telephone Encounter (Signed)
Ray [name], from [facility] calls wanting to know if Dr. [name] is willing to be patient's attending physician while under [facility].

## 2015-01-16 NOTE — Telephone Encounter (Signed)
Wife dropped off FMLA forms to be filled out.  Please fax when completed.

## 2015-01-16 NOTE — Telephone Encounter (Signed)
Returned call to [place] and let her know that yes I will be his attending for hospice. Asked that hospice MD also comanage for symptoms.   Leeanne Rio, MD

## 2015-01-16 NOTE — Progress Notes (Signed)
Nutrition Brief Note  Patient identified on the Malnutrition Screening Tool (MST) Report. Patient being discharged home with Hospice care. No nutrition interventions indicated at this time. If nutrition issues arise, please consult RD.   Molli Barrows, RD, LDN, [place] Pager [phone] After Hours Pager [phone]

## 2015-01-16 NOTE — Progress Notes (Signed)
Notified by Viona Gilmore of family request for Hospice and [place] services at home after discharge. Chart and patient information currently under review to confirm hospice eligibility.   Spoke with [name], via phone conversation to initiate education related to hospice philosophy, services and team approach to care. Family verbalized understanding of the information provided. Per discussion plan is for discharge to home by [facility] tomorrow.   Please send signed completed DNR form home with patient.  Patient will need prescriptions for discharge comfort medications.   DME needs discussed and family requested hospital bed, hoyer, lift, bedside commode for delivery to the home today.  [facility] equipment manager Jewel Ysidro Evert notified and will contact [facility] to arrange delivery to the home.   [facility] aware of the above.  Completed discharge summary will need to be faxed to [facility] at [phone] when final.  Please notify [facility] when patient is ready to leave unit at discharge-call [phone].   [facility] information and contact numbers have been given to [employer].   Please call with any questions.  Thank You,  Freddi Starr RN, [facility] Liaison  [phone]

## 2015-01-16 NOTE — Progress Notes (Signed)
Physical Therapy Treatment Patient Details Name: Ray [name] MRN: [id number] DOB: 1955-02-26 Today's Date: 01/16/2015    History of Present Illness Patient has end stage pancreatic cancer and has now sustained Rt CVA with dense Lt HP.    PT Comments    Pt progressing towards physical therapy goals. Pt lethargic but agreeable to work with therapy today. Was able to achieve OOB with +2 assist for SPT to chair. Pt and family were educated on proper positioning in chair. Pillows were placed on L side, under BUE's, and under elevated LE's (floating heels). A roll was placed between feet/knees to keep LE's in neutral alignment. Therapist answered family's questions of what to expect with rehab and what equipment will be appropriate upon return home.   Follow Up Recommendations  Other (comment) (Home with Hospice support per family)     Equipment Recommendations  Wheelchair (measurements PT);Wheelchair cushion (measurements PT);Hospital bed;Other (comment) Harrel Lemon lift)    Recommendations for Other Services       Precautions / Restrictions Precautions Precautions: Fall Precaution Comments: poor proprioception Lt Restrictions Weight Bearing Restrictions: No Other Position/Activity Restrictions: poor trunk control    Mobility  Bed Mobility Overal bed mobility: Needs Assistance Bed Mobility: Rolling;Sidelying to Sit Rolling: Mod assist;Max assist Sidelying to sit: Max assist;+2 for physical assistance       General bed mobility comments: Pt used RUE to pull on R railing and assist with scooting himself to the EOB once he was sidelying. +2 required to elevate trunk to full sitting position.   Transfers Overall transfer level: Needs assistance Equipment used: 2 person hand held assist Transfers: Sit to/from [facility] Sit to Stand: Max assist;+2 physical assistance Stand pivot transfers: Total assist;+2 physical assistance;From elevated surface        General transfer comment: left knee blocked into extension to prevent buckling, able to take small step with Rt foot during pivot transfer. Standing weight shifts max assist  Ambulation/Gait             General Gait Details: Unable at this time.    Stairs            Wheelchair Mobility    Modified Rankin (Stroke Patients Only) Modified Rankin (Stroke Patients Only) Pre-Morbid Rankin Score: No symptoms Modified Rankin: Severe disability     Balance Overall balance assessment: Needs assistance Sitting-balance support: Feet supported;No upper extremity supported Sitting balance-Leahy Scale: Zero   Postural control: Posterior lean;Left lateral lean Standing balance support: Single extremity supported;During functional activity Standing balance-Leahy Scale: Zero                      Cognition Arousal/Alertness: Lethargic Behavior During Therapy: Flat affect Overall Cognitive Status: Difficult to assess                      Exercises      General Comments        Pertinent Vitals/Pain Pain Assessment: No/denies pain    Home Living                      Prior Function            PT Goals (current goals can now be found in the care plan section) Acute Rehab PT Goals Patient Stated Goal: to do what he can for himself. PT Goal Formulation: With patient/family Time For Goal Achievement: 01/28/15 Potential to Achieve Goals: Fair Progress towards PT goals: Progressing toward goals  Frequency  Min 3X/week    PT Plan Discharge plan needs to be updated    Co-evaluation             End of Session Equipment Utilized During Treatment: Gait belt Activity Tolerance: Patient limited by fatigue Patient left: in chair;with chair alarm set;with call bell/phone within reach;with family/visitor present     Time: 0920-0946 PT Time Calculation (min) (ACUTE ONLY): 26 min  Charges:  $Therapeutic Activity: 23-37 mins                     G Codes:      Rolinda Roan 28-Jan-2015, 11:25 AM   Rolinda Roan, PT, DPT Acute Rehabilitation Services Pager: [phone]

## 2015-01-17 DIAGNOSIS — F329 Major depressive disorder, single episode, unspecified: Secondary | ICD-10-CM

## 2015-01-17 MED ORDER — RESOURCE THICKENUP CLEAR PO POWD
ORAL | Status: DC | PRN
  Filled 2015-01-17 (×2): qty 125

## 2015-01-17 MED ORDER — PANTOPRAZOLE SODIUM 40 MG PO TBEC
40.0000 mg | DELAYED_RELEASE_TABLET | Freq: Every day | ORAL | Status: DC
  Administered 2015-01-17: 40 mg via ORAL
  Filled 2015-01-17: qty 1

## 2015-01-17 NOTE — Progress Notes (Signed)
Family Medicine Teaching Service Daily Progress Note Intern Pager: [phone]  Patient name: Ray [name] Medical record number: [id number] Date of birth: [birth year] Age: 59 y.o. Gender: male  Primary Care Provider: Chrisandra Netters, MD Consultants: neurology, palliative care Code Status: DNR  Pt Overview and Major Events to Date:  12/9 - admitted with R-MCA stroke 12/11 - neurology signed off  Assessment and Plan: Ray [name] is a 59 y.o. male presenting with left sided weakness since approximately 7:15 AM, consistent with stroke. 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 Stroke in the setting of Xarelto use. Will not pursue aggressive work-up or treatment as pt is on hospice related to his end-stage pancreatic cancer. ECHO not performed as would not change management.   - PT/OT/SLP - no rehab given [place] discussion, will await further recs for diet as they elect to comply with these for now - Palliative care consulted - plan for d/c home with [facility] - Continue aspirin 325, d/c Xarelto   Pancreatic Cancer- Diagnosed in 05/2013, s/p chemotherapy and radiation with completion of radiation on 11/25/2014. Oncologist is Dr [name]. He has been made DNR and is receiving home hospice care. He does have significant chronic cancer related pain for which he takes MSIR and MS contin - MS contin 30mg  BID, MSIR 15mg  Q3 prn, IV morphine for breakthrough (titrate up as needed) - Family would like to transition him to [facility] [place]; care managemnt to facilitate this  Depression- Stable - Will continue home cymbalta 60 mg qD - ativan PRN anxiety  Hypertension- Stable without antihypertensives - will follow closely and treat as needed  FEN/GI: dysphagia 2 with nectar thick liquids, MIVF 100 cc/hr NS, protonix IV  Prophylaxis: SCDs  Disposition: home with hospice once DME obtained  (hospital bed, shower chair, lift for bed)   Subjective:  - Pt mildly sleepy this AM with 3/10 abdominal pain. Denies chest pain, SOB  Objective: Temp:  [98.7 F (37.1 C)-99.6 F (37.6 C)] 99.3 F (37.4 C) (12/13 0610) Pulse Rate:  [62-84] 72 (12/13 0610) Resp:  [16-20] 18 (12/13 0610) BP: (140-164)/(87-94) 162/89 mmHg (12/13 0610) SpO2:  [97 %-99 %] 98 % (12/13 0610) Physical Exam: General: NAD, cachectic, sleeping sitting up in recliner  HEENT: PERRL, normal oropharynx, MMM Cardiovascular: RRR, no murmurs auscultated Respiratory: CTAB, normal WOB Abdomen: soft, mild tenderness to palpation, no rebound or guarding, + BS MSK: decreased muscle bulk diffusely Neuro: Decreased facial sensation on left. Left sided facial droop. Unable to move left limbs. Decreased strength of right upper and lower extremities, 3/5 and 2/5 respectively (but wife said he wasn't giving it his best effort.)   Laboratory:  Recent Labs Lab 01/13/15 0834 01/13/15 0841 01/14/15 0523  WBC 10.9*  --  10.5  HGB 10.4* 11.9* 9.0*  HCT 31.1* 35.0* 26.9*  PLT 51*  --  51*    Recent Labs Lab 01/13/15 0834 01/13/15 0841 01/14/15 0523  NA 136 136 139  K 3.6 3.4* 3.3*  CL 99* 95* 102  CO2 28  --  26  BUN 6 7 8   CREATININE 0.98 0.90 0.85  CALCIUM 8.4*  --  8.3*  PROT 5.7*  --  5.5*  BILITOT 1.9*  --  2.0*  ALKPHOS 261*  --  267*  ALT 15*  --  14*  AST 26  --  28  GLUCOSE 95 88 93    Imaging/Diagnostic  Tests: Ct Angio Head W/cm &/or Wo Cm  01/13/2015  CLINICAL DATA:  Sudden onset of left-sided weakness. Last seen normal at 7:15 a.m. 01/13/2015. History of pancreatic cancer. EXAM: CT ANGIOGRAPHY HEAD AND NECK TECHNIQUE: Multidetector CT imaging of the head and neck was performed using the standard protocol during bolus administration of intravenous contrast. Multiplanar CT image reconstructions and MIPs were obtained to evaluate the vascular anatomy. Carotid stenosis measurements (when applicable) are  obtained utilizing NASCET criteria, using the distal internal carotid diameter as the denominator. CONTRAST:  63mL OMNIPAQUE IOHEXOL 350 MG/ML SOLN. A total of 90 mL Omnipaque was given, with 40 mL used for perfusion. COMPARISON:  CT head earlier today. CT perfusion reported separately. FINDINGS: CT HEAD Reported separately at 0841 hours.  No hemorrhage. CTA NECK Aortic arch: Standard branching. Imaged portion shows no evidence of aneurysm or dissection. No significant stenosis of the major arch vessel origins. Right carotid system: No evidence of dissection, stenosis (50% or greater) or occlusion. Left carotid system: No evidence of dissection, stenosis (50% or greater) or occlusion. Vertebral arteries: RIGHT vertebral dominant without proximal disease. No evidence for dissection or occlusion. 50% ostial stenosis at the origin of the smaller LEFT vertebral. No subclavian disease of significance. Nonvascular soft tissues: Port-A-Cath good position. Severe spondylosis. No visible lung metastases. Considerable degenerative change at C1-C2. CTA HEAD Anterior circulation: Minor BILATERAL cavernous carotid atherosclerotic calcifications. Occluded RIGHT M1 MCA. This occlusion begins 5 mm from the RIGHT ICA terminus. There is preserved flow into the RIGHT MCA M2 and M3 branches, perhaps around the clot or via pial collaterals. No other anterior circulation disease of significance. No significant stenosis, proximal occlusion, aneurysm, or vascular malformation. Posterior circulation: RIGHT vertebral dominant. No significant stenosis, proximal occlusion, aneurysm, or vascular malformation. Venous sinuses: As permitted by contrast timing, patent. Anatomic variants: None of significance. Delayed phase:   No abnormal intracranial enhancement. IMPRESSION: Proximal large vessel occlusion RIGHT M1 MCA. Some flow is preserved into the RIGHT MCA M2 and M3 segments. No other intracranial or extracranial stenosis or occlusion of  significance. Ordering provider was paged with these results at 9:09 a.m. while the study was in progress. Electronically Signed   By: Staci Righter M.D.   On: 01/13/2015 10:42   Ct Head Wo Contrast  01/13/2015  CLINICAL DATA:  Code stroke.  Left-sided weakness. EXAM: CT HEAD WITHOUT CONTRAST TECHNIQUE: Contiguous axial images were obtained from the base of the skull through the vertex without intravenous contrast. COMPARISON:  None FINDINGS: The brainstem and cerebellum appear normal. Cerebral hemispheres show areas of low-density throughout the deep white matter consistent with chronic small vessel disease. No sign of acute cortical infarction. No mass lesion, hemorrhage, hydrocephalus or extra-axial collection. No calvarial abnormality. Sinuses are clear. IMPRESSION: Low-density in the cerebral hemispheric white matter bilaterally consistent with chronic small vessel disease, premature for age. No identifiable acute infarction. Acute infarction could be hidden amongst the extensive chronic changes. I am attempting to call  report. Electronically Signed   By: Nelson Chimes M.D.   On: 01/13/2015 08:55   Ct Angio Neck W/cm &/or Wo/cm  01/13/2015  CLINICAL DATA:  Sudden onset of left-sided weakness. Last seen normal at 7:15 a.m. 01/13/2015. History of pancreatic cancer. EXAM: CT ANGIOGRAPHY HEAD AND NECK TECHNIQUE: Multidetector CT imaging of the head and neck was performed using the standard protocol during bolus administration of intravenous contrast. Multiplanar CT image reconstructions and MIPs were obtained to evaluate the vascular anatomy. Carotid stenosis measurements (when  applicable) are obtained utilizing NASCET criteria, using the distal internal carotid diameter as the denominator. CONTRAST:  53mL OMNIPAQUE IOHEXOL 350 MG/ML SOLN. A total of 90 mL Omnipaque was given, with 40 mL used for perfusion. COMPARISON:  CT head earlier today. CT perfusion reported separately. FINDINGS: CT HEAD Reported  separately at 0841 hours.  No hemorrhage. CTA NECK Aortic arch: Standard branching. Imaged portion shows no evidence of aneurysm or dissection. No significant stenosis of the major arch vessel origins. Right carotid system: No evidence of dissection, stenosis (50% or greater) or occlusion. Left carotid system: No evidence of dissection, stenosis (50% or greater) or occlusion. Vertebral arteries: RIGHT vertebral dominant without proximal disease. No evidence for dissection or occlusion. 50% ostial stenosis at the origin of the smaller LEFT vertebral. No subclavian disease of significance. Nonvascular soft tissues: Port-A-Cath good position. Severe spondylosis. No visible lung metastases. Considerable degenerative change at C1-C2. CTA HEAD Anterior circulation: Minor BILATERAL cavernous carotid atherosclerotic calcifications. Occluded RIGHT M1 MCA. This occlusion begins 5 mm from the RIGHT ICA terminus. There is preserved flow into the RIGHT MCA M2 and M3 branches, perhaps around the clot or via pial collaterals. No other anterior circulation disease of significance. No significant stenosis, proximal occlusion, aneurysm, or vascular malformation. Posterior circulation: RIGHT vertebral dominant. No significant stenosis, proximal occlusion, aneurysm, or vascular malformation. Venous sinuses: As permitted by contrast timing, patent. Anatomic variants: None of significance. Delayed phase:   No abnormal intracranial enhancement. IMPRESSION: Proximal large vessel occlusion RIGHT M1 MCA. Some flow is preserved into the RIGHT MCA M2 and M3 segments. No other intracranial or extracranial stenosis or occlusion of significance. Ordering provider was paged with these results at 9:09 a.m. while the study was in progress. Electronically Signed   By: Staci Righter M.D.   On: 01/13/2015 10:42   Ct Cerebral Perfusion W/cm  01/13/2015  CLINICAL DATA:  Sudden onset of left-sided weakness. EXAM: CT CEREBRAL PERFUSION WITH CONTRAST  TECHNIQUE: Sixteen contiguous slices, 5 mm thick, from the middle cranial fossa through the convexity were obtained during the bolus infusion of 40 mL Isovue 370. Post processing software was used to analyze cerebral blood volume, cerebral blood flow, mean transit time, and time to peak enhancement. CONTRAST:  20mL OMNIPAQUE IOHEXOL 350 MG/ML SOLN COMPARISON:  Noncontrast CT head performed earlier in the day. CTA head neck reported separately. FINDINGS: There is a proximal M1 occlusion on the RIGHT resulting in significant reduction of CBF, prolonged MTT, and delayed TTP throughout much of the RIGHT MCA territory, including the frontal, parietal, temporal, and insular regions. There is sparing of the basal ganglia, including the lentiform nucleus. Cerebral blood volume, however, is normal throughout the affected areas indicating a significant mismatch or penumbra. IMPRESSION: Abnormal CT perfusion scan consistent with a large ischemic penumbra in this patient with a proximal RIGHT MCA M1 large vessel occlusion. The ordering provider was paged with these results at 9:09 a.m. while the study was still in progress. Electronically Signed   By: Staci Righter M.D.   On: 01/13/2015 10:26    Veatrice Bourbon, MD 01/17/2015, 8:11 AM PGY-2, [place] Intern pager: [phone], text pages welcome

## 2015-01-17 NOTE — Care Management Important Message (Signed)
Important Message  Patient Details  Name: Ray [name] MRN: [id number] Date of Birth: [birth year]   Medicare Important Message Given:  Yes    Santasia Rew P Jovanni Rash 01/17/2015, 10:09 AM

## 2015-01-17 NOTE — Progress Notes (Signed)
Speech Language Pathology Treatment: Dysphagia (education )  Patient Details Name: Ray [name] MRN: [id number] DOB: 03/01/1955 Today's Date: 01/17/2015 Time: 1445-1500 SLP Time Calculation (min) (ACUTE ONLY): 15 min  Assessment / Plan / Recommendation Clinical Impression  Skilled treatment session focused on addressing wrap up dysphagia education with wife and patient prior to discharge home.  SLP educated wife on today's diet downgrade due to severity of oral residue and that being the safest texture option at this time.  Wife asked appropriate question regarding how to thicken liquids to a nectar-thick consistency as well as how to prepare pureed foods; SLP utilized verbal teach back for education. SLP also reinforced the importance of diligent oral care for quality of life and comfort; suctioning at home would be beneficial.  Patient and wife ready for discharge, education complete and SLP signing off.      HPI HPI: Ray [name] is a 59 y.o. male presenting with left sided weakness since approximately 7:15 AM, consistent with stroke . PMH is significant for pancreatic cancer, splenic vein thrombosis. 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 No prior history of dysphagia/  Followed by Palliative Care.  Plan is to D/C home with hospice secondary to advanced cancer.      SLP Plan  Continue with current plan of care     Recommendations  Diet recommendations: Dysphagia 1 (puree);Nectar-thick liquid Liquids provided via: Teaspoon Medication Administration: Whole meds with puree Supervision: Staff to assist with self feeding;Full supervision/cueing for compensatory strategies Compensations: Slow rate;Small sips/bites;Lingual sweep for clearance of pocketing;Other (Comment) (suction as needed) Postural Changes and/or Swallow Maneuvers: Seated upright 90 degrees              Oral Care Recommendations: Oral care before and after PO Follow up  Recommendations: 24 hour supervision/assistance;Other (comment) (may require suction at home) Plan: Continue with current plan of care  Carmelia Roller., CCC-SLP [place] 01/17/2015, 3:32 PM

## 2015-01-17 NOTE — Progress Notes (Signed)
Family Medicine Teaching Service PCP Social Visit  Stopped by to see patient again tonight. He was more alert intermittently. States pain is well controlled. Had a good visit with his wife [name]. She is coping well. They are planning to go home tomorrow. He did have to be suctioned today as he was having a little more trouble swallowing. Is now taking only pureed foods. Encouraged [name] to call [facility] to let them know they also need a suction system at home so they have this on hand.   Appreciate FMTS providing such compassionate care for Mr. Aida Puffer.  Chrisandra Netters, MD [place]

## 2015-01-17 NOTE — Progress Notes (Signed)
Speech Language Pathology Treatment: Dysphagia  Patient Details Name: Ray [name] MRN: [id number] DOB: 1955/02/05 Today's Date: 01/17/2015 Time: [id number] SLP Time Calculation (min) (ACUTE ONLY): 10 min  Assessment / Plan / Recommendation Clinical Impression  Skilled treatment session focused on addressing dysphagia goals and wrap up of education; however, wife not present today.  Patient with severe dysarthria today and unintelligible.  SLP utilized yes/no questions to communicate with patient who was agreeable to and accepting of a drink.  Patient consumed nectar-thick liquids via teaspoon with Total assist for feeding and placement of boluses on the right side of his oral cavity.  Patient with cough in 1/3 trials and then refused further sips.  RN and nurse etch report significant pocketing with Dys.2 textures resulting in need for oral suctioning.  Given severity of left sided deficits at this time and nursing report recommend downgrade to Dys.1 textures as means of safest PO intake. However, patient and family my elect other PO for comfort and quality of life with accepted risks.  SLP will attempt to follow up with wife x1 prior to discharge to wrap up education.       HPI HPI: Ray [name] is a 59 y.o. male presenting with left sided weakness since approximately 7:15 AM, consistent with stroke . PMH is significant for pancreatic cancer, splenic vein thrombosis. 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 No prior history of dysphagia/  Followed by Palliative Care.  Plan is to D/C home with hospice secondary to advanced cancer.      SLP Plan  Continue with current plan of care     Recommendations  Diet recommendations: Dysphagia 1 (puree);Nectar-thick liquid Liquids provided via: Teaspoon Medication Administration: Whole meds with puree Supervision: Staff to assist with self feeding;Full supervision/cueing for compensatory strategies Compensations:  Slow rate;Small sips/bites;Lingual sweep for clearance of pocketing;Other (Comment) (suction as needed) Postural Changes and/or Swallow Maneuvers: Seated upright 90 degrees              Oral Care Recommendations: Oral care before and after PO Follow up Recommendations: 24 hour supervision/assistance;Other (comment) (may require suction at home) Plan: Continue with current plan of care  Carmelia Roller., [place]  [place] 01/17/2015, 1:29 PM

## 2015-01-18 DIAGNOSIS — R109 Unspecified abdominal pain: Secondary | ICD-10-CM

## 2015-01-18 DIAGNOSIS — C250 Malignant neoplasm of head of pancreas: Secondary | ICD-10-CM

## 2015-01-18 MED ORDER — RESOURCE THICKENUP CLEAR PO POWD: 1.0000 g | ORAL | Status: AC | PRN

## 2015-01-18 MED ORDER — LORAZEPAM 1 MG PO TABS: 1.0000 mg | ORAL_TABLET | ORAL | Status: AC | PRN

## 2015-01-18 MED ORDER — POLYETHYL GLYCOL-PROPYL GLYCOL 0.4-0.3 % OP SOLN: 1.0000 [drp] | Freq: Three times a day (TID) | OPHTHALMIC | Status: AC | PRN

## 2015-01-18 MED ORDER — POLYETHYLENE GLYCOL 3350 17 G PO PACK: 17.0000 g | PACK | Freq: Every day | ORAL | Status: AC

## 2015-01-18 MED ORDER — OMEPRAZOLE 40 MG PO CPDR: 40.0000 mg | DELAYED_RELEASE_CAPSULE | Freq: Every day | ORAL | Status: AC

## 2015-01-18 MED ORDER — MORPHINE SULFATE ER 30 MG PO TBCR: 30.0000 mg | EXTENDED_RELEASE_TABLET | Freq: Three times a day (TID) | ORAL | Status: AC

## 2015-01-18 MED ORDER — MORPHINE SULFATE 15 MG PO TABS: 15.0000 mg | ORAL_TABLET | ORAL | Status: AC | PRN

## 2015-01-18 MED ORDER — PROCHLORPERAZINE MALEATE 10 MG PO TABS: 10.0000 mg | ORAL_TABLET | Freq: Four times a day (QID) | ORAL | Status: AC | PRN

## 2015-01-18 MED ORDER — DULOXETINE HCL 60 MG PO CPEP: 60.0000 mg | ORAL_CAPSULE | Freq: Every day | ORAL | Status: AC

## 2015-01-18 MED ORDER — ASPIRIN 325 MG PO TABS: 325.0000 mg | ORAL_TABLET | Freq: Every day | ORAL | Status: AC

## 2015-01-18 MED ORDER — TRAZODONE HCL 50 MG PO TABS: 50.0000 mg | ORAL_TABLET | Freq: Every evening | ORAL | Status: AC | PRN

## 2015-01-18 MED ORDER — SILDENAFIL CITRATE 50 MG PO TABS: 50.0000 mg | ORAL_TABLET | ORAL | Status: AC | PRN

## 2015-01-18 NOTE — Care Management Note (Signed)
Case Management Note  Patient Details  Name: Ray [name] MRN: [id number] Date of Birth: [birth year]  Subjective/Objective:                    Action/Plan: Plan is for home with [facility] today. CM spoke with [facility] the liaison and equipment should be delivered today between 11-3pm. Patient will then be transported home via ambulance. CM will continue to follow today for any further discharge needs.   Expected Discharge Date:                  Expected Discharge Plan:  Home w Hospice Care  In-House Referral:     Discharge planning Services  CM Consult  Post Acute Care Choice:    Choice offered to:     DME Arranged:    DME Agency:     HH Arranged:    [place] Agency:  [facility]  Status of Service:  In process, will continue to follow  Medicare Important Message Given:  Yes Date Medicare IM Given:    Medicare IM give by:    Date Additional Medicare IM Given:    Additional Medicare Important Message give by:     If discussed at [place] of Stay Meetings, dates discussed:    Additional Comments:  Pollie Friar, RN 01/18/2015, 10:29 AM

## 2015-01-18 NOTE — Progress Notes (Signed)
Patients equipment delivered to his home. Awaiting [facility] to transport pt home. Hospice notified of patient awaiting transport and information requested faxed to [place] at number provided.

## 2015-01-18 NOTE — Progress Notes (Signed)
Occupational Therapy Treatment Patient Details Name: Ray [name] MRN: [id number] DOB: 1955-12-28 Today's Date: 01/18/2015    History of present illness Patient has end stage pancreatic cancer and has now sustained Rt CVA with dense Lt HP.   OT comments  Pt lethargic during session, due to having just received medication in preparation of d/c home via ambulance.  Pt's daughter present, educated on rolling every 2 hours and proper positioning for Lt hemiparesis in bed and when sitting upright.  Provided pt's daughter with handout with pictures and written instructions to increase carryover at home.  PROM to LUE, educated pt's daughter on simple PROM and reiterated proper positioning.  Follow Up Recommendations  Other (comment) (plan is home with hospice)    Equipment Recommendations  3 in 1 bedside comode    Recommendations for Other Services      Precautions / Restrictions Precautions Precautions: Fall Precaution Comments: poor proprioception Lt       Mobility Bed Mobility Overal bed mobility: Needs Assistance Bed Mobility: Rolling Rolling: Mod assist;Max assist         General bed mobility comments: Pt used RUE to pull on Lt railing to assist with rolling at bed level with mod assist when rolling to Rt and max assist rolling to Lt.         ADL Overall ADL's : Needs assistance/impaired                                       General ADL Comments: Pt sleeping upon OT arrival, but aroused to name upon therapist entering room.  Pt preparing to d/c home with hospice and daughter present asking questions regarding positioning.  Provided pt's daughter with handout with written and picture demonstrations of proper positioning for Lt hemiparesis.  Educated on rolling every 2 hours to decrease pressure sores and skin breakdown.                Cognition   Behavior During Therapy: Flat affect Overall Cognitive Status: Difficult to assess                                     Pertinent Vitals/ Pain       Pain Assessment: No/denies pain   Progress Toward Goals  OT Goals(current goals can now be found in the care plan section)  Progress towards OT goals: Progressing toward goals  Acute Rehab OT Goals Patient Stated Goal: to do what he can for himself. OT Goal Formulation: With patient/family  Plan Discharge plan remains appropriate       End of Session     Activity Tolerance Patient limited by lethargy   Patient Left in bed;with bed alarm set;with family/visitor present   Nurse Communication          Time: 1435-1450 OT Time Calculation (min): 15 min  Charges: OT General Charges $OT Visit: 1 Procedure OT Treatments $Self Care/Home Management : 8-22 mins  Simonne Come, [id number] 01/18/2015, 3:28 PM

## 2015-01-18 NOTE — Discharge Instructions (Signed)
Hospice °Hospice is a service that is designed to provide people who are terminally ill and their families with medical, spiritual, and psychological support. Its aim is to improve your quality of life by keeping you as alert and comfortable as possible. Hospice is performed by a team of health care professionals and volunteers who: °· Help keep you comfortable. Hospice can be provided in your home or in a homelike setting. The hospice staff works with your family and friends to help meet your needs. You will enjoy the support of loved ones by receiving much of your basic care from family and friends. °· Provide pain relief and manage your symptoms. The staff supply all necessary medicines and equipment. °· Provide companionship when you are alone. °· Allow you and your family to rest. They may do light housekeeping, prepare meals, and run errands. °· Provide counseling. They will make sure your emotional, spiritual, and social needs and those of your family are being met. °· Provide spiritual care. Spiritual care is individualized to meet your needs and your family's needs. It may involve helping you look at what death means to you, say goodbye, or perform a specific religious ceremony or ritual. °Hospice teams often include: °· A nurse. °· A doctor. °· Social workers. °· Religious leaders (such as a chaplain). °· Trained volunteers. °WHEN SHOULD HOSPICE CARE BEGIN? °Most people who use hospice are believed to have fewer than 6 months to live. Your family and health care providers can help you decide when hospice services should begin. If your condition improves, you may discontinue the program. °WHAT SHOULD I CONSIDER BEFORE SELECTING A PROGRAM? °Most hospice programs are run by nonprofit, independent organizations. Some are affiliated with hospitals, nursing homes, or home health care agencies. Hospice programs can take place in the home or at a hospice center, hospital, or skilled nursing facility. When choosing  a hospice program, ask the following questions: °· What services are available to me? °· What services are offered to my loved ones? °· How involved are my loved ones? °· How involved is my health care provider? °· Who makes up the hospice care team? How are they trained or screened? °· How will my pain and symptoms be managed? °· If my circumstances change, can the services be provided in a different setting, such as my home or in the hospital? °· Is the program reviewed and licensed by the state or certified in some other way? °WHERE CAN I LEARN MORE ABOUT HOSPICE? °You can learn about existing hospice programs in your area from your health care providers. You can also read more about hospice online. The websites of the following organizations contain helpful information: °· The National Hospice and Palliative Care Organization (NHPCO). °· The Hospice Association of America (HAA). °· The Hospice Education Institute. °· The American Cancer Society (ACS). °· Hospice Net. °  °This information is not intended to replace advice given to you by your health care provider. Make sure you discuss any questions you have with your health care provider. °  °Document Released: 05/10/2003 Document Revised: 01/26/2013 Document Reviewed: 12/01/2012 °Elsevier Interactive Patient Education ©2016 [employer]. ° °

## 2015-01-18 NOTE — Progress Notes (Signed)
Family Medicine Teaching Service Daily Progress Note Intern Pager: [phone]  Patient name: Ray [name] Medical record number: [id number] Date of birth: 20-Mar-1955 Age: 59 y.o. Gender: male  Primary Care Provider: Chrisandra Netters, MD Consultants: neurology, palliative care Code Status: DNR  Pt Overview and Major Events to Date:  12/9 - admitted with R-MCA stroke 12/11 - neurology signed off  Assessment and Plan: Ray [name] is a 59 y.o. male presenting with left sided weakness since approximately 7:15 AM, consistent with stroke. 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 Stroke in the setting of Xarelto use. Will not pursue aggressive work-up or treatment as pt is on hospice related to his end-stage pancreatic cancer. ECHO not performed as would not change management.   - PT/OT/SLP - no rehab given [place] discussion, may require oral suctioning at home, dysphagia 1 (puree) nectar thick diet recommended - Palliative care consulted - plan for d/c home with [facility] - Continue aspirin 325, d/c Xarelto   Pancreatic Cancer- Diagnosed in 05/2013, s/p chemotherapy and radiation with completion of radiation on 11/25/2014. Oncologist is Dr [name]. He has been made DNR and is receiving home hospice care. He does have significant chronic cancer related pain for which he takes MSIR and MS contin - MS contin 30mg  BID, MSIR 15mg  Q3 prn, IV morphine for breakthrough (titrate up as needed) - Family would like to transition him to hospice of [place]; care managemnt to facilitate this  Depression- Stable - Will continue home cymbalta 60 mg qD - ativan q4h PRN anxiety  Hypertension- Stable without antihypertensives - will follow closely and treat as needed  FEN/GI: dysphagia 1 with nectar thick liquids, MIVF 100 cc/hr NS, protonix IV  Prophylaxis: SCDs  Disposition: home with hospice once DME  obtained (hospital bed, shower chair, lift for bed); anticipated dc 01/18/15   Subjective:  - Pt sleepy this AM with no complaints of pain.  - Has had BMs Monday and Tueday  Objective: Temp:  [98.7 F (37.1 C)-99.1 F (37.3 C)] 98.7 F (37.1 C) (12/14 [id number]) Pulse Rate:  [60-91] 61 (12/14 0608) Resp:  [18-20] 18 (12/14 0608) BP: (151-172)/(85-97) 165/85 mmHg (12/14 0608) SpO2:  [98 %-100 %] 98 % (12/14 [id number]) Physical Exam: General: NAD, cachectic, sleeping with head elevated in bed HEENT: PERRL, normal oropharynx, MMM Cardiovascular: RRR, no murmurs auscultated Respiratory: CTAB, normal WOB Abdomen: soft, no TTP, no rebound or guarding, + BS MSK: decreased muscle bulk diffusely Neuro: Left sided facial droop. Worsening dysarthria. Flaccid left-sided hemiplegia. EOMI. Followed commands, wiggling his right toes and demonstrating hand grip of 4/5 Could not write his questions with his right hand but able to grip pen and scribble on paper. Decreased strength of right upper and lower extremities, 4/5. Easy to arouse but continued to fall asleep between questions.   Laboratory:  Recent Labs Lab 01/13/15 0834 01/13/15 0841 01/14/15 0523  WBC 10.9*  --  10.5  HGB 10.4* 11.9* 9.0*  HCT 31.1* 35.0* 26.9*  PLT 51*  --  51*    Recent Labs Lab 01/13/15 0834 01/13/15 0841 01/14/15 0523  NA 136 136 139  K 3.6 3.4* 3.3*  CL 99* 95* 102  CO2 28  --  26  BUN 6 7 8   CREATININE 0.98 0.90 0.85  CALCIUM 8.4*  --  8.3*  PROT 5.7*  --  5.5*  BILITOT 1.9*  --  2.0*  ALKPHOS 261*  --  267*  ALT 15*  --  14*  AST 26  --  28  GLUCOSE 95 88 93    Imaging/Diagnostic Tests: No new imaging since last progress note.   Rogue Bussing, MD 01/18/2015, 6:16 AM PGY-1, [place] Intern pager: [phone], text pages welcome

## 2015-01-18 NOTE — Telephone Encounter (Signed)
Forms have been completed and faxed to wife's employer. Copy to be scanned into chart. Wife informed of this. Leeanne Rio, MD

## 2015-01-18 NOTE — Progress Notes (Signed)
RN discussed discharge instructions with patient's daughter. Patient's daughter vocalized understanding. Prescriptions and discharge instructions given to patient's family. Per patient's family, all ordered equipment has arrived at patient's house. Patient's neuro assessment and NIHHS score unchanged. Patient denies any pain but asked for ativan prior to transportation to home d/t ambulance ride. Patient is now resting in room, per CSW, [facility] called for transportation home. Will continue to monitor until transportation arrives.

## 2015-01-18 NOTE — Clinical Social Work Note (Signed)
CSW received consult for patient needing EMS transport to patient's home.  CSW contacted [facility] and scheduled ambulance transport.  CSW to sign off.  [name] Broom. Whiting, MSW, [place] 01/18/2015 2:10 PM

## 2015-01-20 ENCOUNTER — Telehealth: Payer: Self-pay | Admitting: Family Medicine

## 2015-01-20 NOTE — Telephone Encounter (Signed)
Called wife Ray [name] to check on how Ray [name]'s doing. He has not eaten or had anything to drink in the last 1-2 days. Seems tired. Not urinating or stooling either. He is not swallowing very much and Ray [name] thinks his morphine pills are just sitting in his mouth.  Advised that she contact hospice team to let them know about the not eating/drinking and so they can get liquid roxanol rx. She is aware that not drinking means a prognosis of several days. She is coping well with this knowledge. Has been very pleased with the care they've received from this hospice team. Offered support to her.  Leeanne Rio, MD

## 2015-01-23 ENCOUNTER — Encounter: Payer: Self-pay | Admitting: Family Medicine

## 2015-01-23 NOTE — Progress Notes (Signed)
Patient ID: Ray [name], male   DOB: September 24, 1955, 59 y.o.   MRN: [id number]  Late entry: Was contacted by patient's wife on Saturday 12/17 to let me know that patient was admitted to [facility] inpatient hospice that day. Yesterday 12/18 I went to visit him there. Family at the bedside. He appeared comfortable and was getting excellent care. Aroused to voice and spoke softly, most of which was unintelligible. He has not eaten or had anything to drink since 05-21-2022. Anticipate death in the next 1-2 days. Offered family support. They were appreciative.  Leeanne Rio, MD

## 2015-01-26 ENCOUNTER — Telehealth: Payer: Self-pay | Admitting: Family Medicine

## 2015-01-26 NOTE — Telephone Encounter (Signed)
Called patient's wife to check on how he's doing. He remains at [facility] with waxing and waning levels of consciousness. [name] reported they're coping well and have lots of support from their church family. She was appreciative of the call.  Leeanne Rio, MD

## 2015-02-02 ENCOUNTER — Telehealth: Payer: Self-pay | Admitting: Family Medicine

## 2015-02-02 NOTE — Telephone Encounter (Signed)
Called patient's wife Ray [name] to check on how Ray [name] & the family is doing. He remains at [facility], and is now unresponsive. Ray [name] suspects it will be hours to days before he dies. The family continues to cope well. She did mention that their daughter Ray [name] will be having new FMLA forms sent to [place] to cover her absence during Anup's illness. These were previously completed by their oncologist. Advised that I am out of the office this week, and they advised next week will be fine for getting the forms completed.    Leeanne Rio, MD

## 2015-02-14 ENCOUNTER — Other Ambulatory Visit: Payer: Self-pay | Admitting: Family Medicine

## 2015-02-20 ENCOUNTER — Telehealth: Payer: Self-pay | Admitting: Family Medicine

## 2015-02-20 NOTE — Telephone Encounter (Signed)
Called patient's wife [name] to check on her & to follow up about FMLA paperwork for daughter Onalee Hua. I had received FMLA paperwork for Hazel's job 1-2 weeks ago but was waiting until after the funeral to contact them. [name] confirmed to me that Onalee Hua does NOT need FMLA paperwork done, that she was able to receive full salary while she was away from work due to having enough vacation and sick time built up. Advised I can discard FMLA paperwork. [place] have a close relationship and I trust this information. [name] is doing relatively well and still has family supporting her.  Leeanne Rio, MD

## 2015-03-08 DEATH — deceased

## 2015-05-09 ENCOUNTER — Encounter: Payer: Self-pay | Admitting: Oncology

## 2015-05-09 NOTE — Progress Notes (Signed)
[employer] form for [facility]  fax sent 09/12/14 I sent to medical records

## 2016-09-25 ENCOUNTER — Telehealth: Payer: Self-pay | Admitting: *Deleted

## 2016-09-25 NOTE — Telephone Encounter (Signed)
No note.  Phone encounter opened in error, a 2015 [place] Triage call documentation note pilot not seen.

## 2017-08-05 IMAGING — CT CT ABD-PELV W/ CM
2 of 11 series · 12 of 46 positions shown, 17 images · IV contrast (OMNIPAQUE)
Comparison: 04/05/2014

CLINICAL DATA: Restaging of pancreatic cancer. Originally diagnosed
[DATE]. History gastroesophageal reflux disease. Hypertension.
Hyperlipidemia. Pancreatitis.

EXAM:
CT ABDOMEN AND PELVIS WITH CONTRAST
TECHNIQUE: Multidetector CT imaging of the abdomen and pelvis was performed
using the standard protocol following bolus administration of
intravenous contrast.
CONTRAST:  100mL OMNIPAQUE IOHEXOL 300 MG/ML  SOLN

[Series 6: venous thins pacs · axial · portal-venous · 0.74mm/px · z∈[+764,+1126]mm · 10 of 147 slices shown, 15 images]
[im 13/147  soft-tissue]
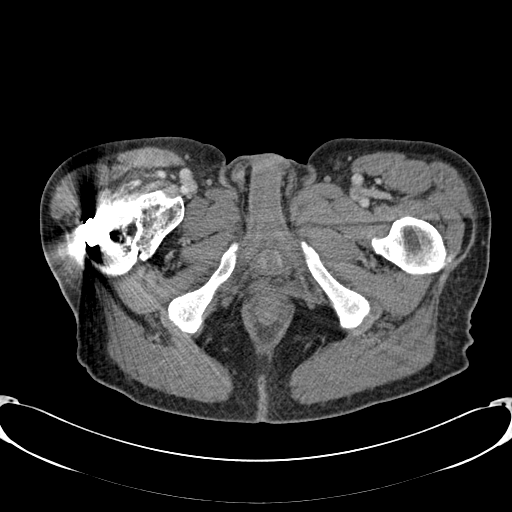
[im 13/147  bone]
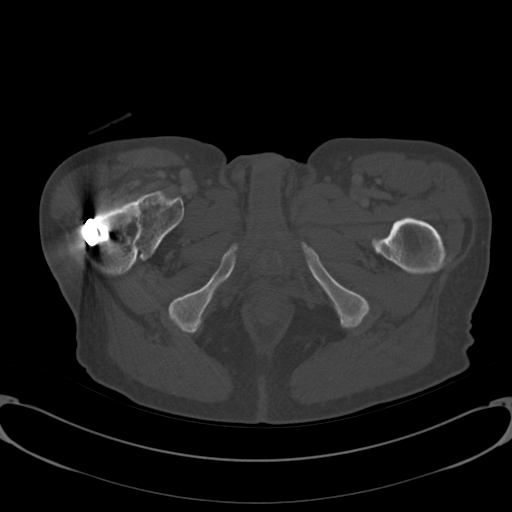
[im 25/147  soft-tissue]
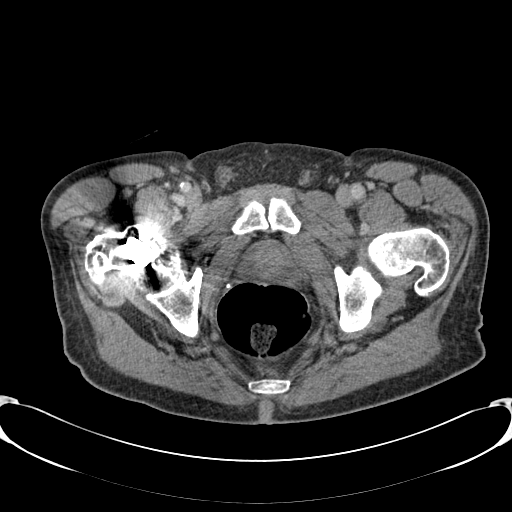
[im 49/147  soft-tissue]
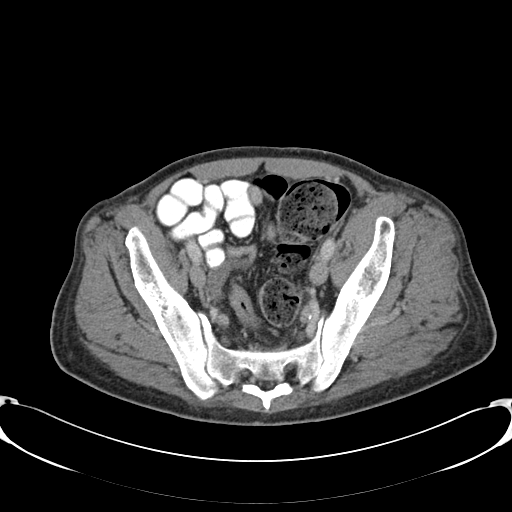
[im 61/147  soft-tissue]
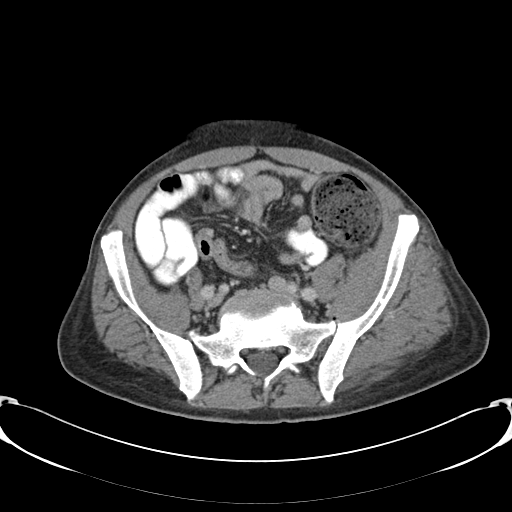
[im 74/147  soft-tissue]
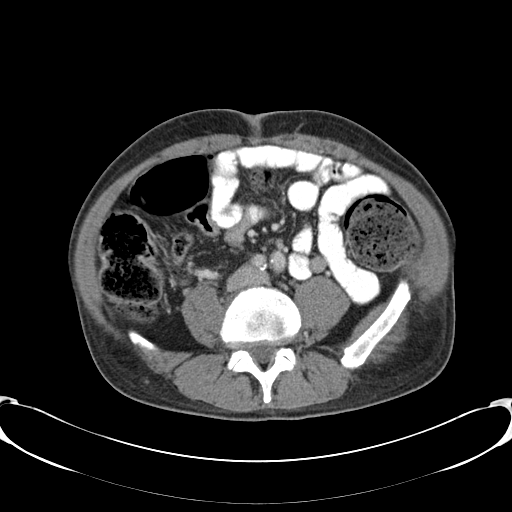
[im 86/147  soft-tissue]
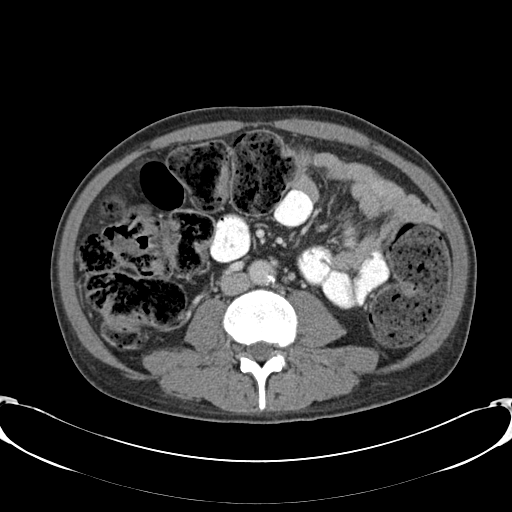
[im 98/147  soft-tissue]
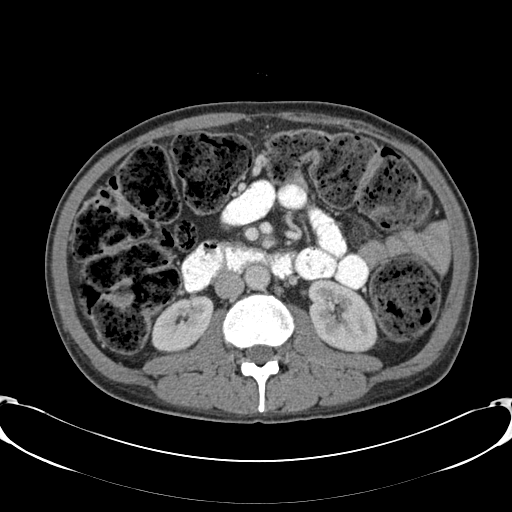
[im 98/147  lung]
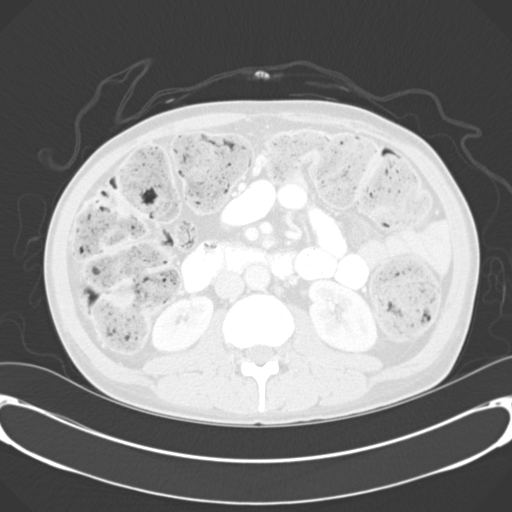
[im 110/147  lung]
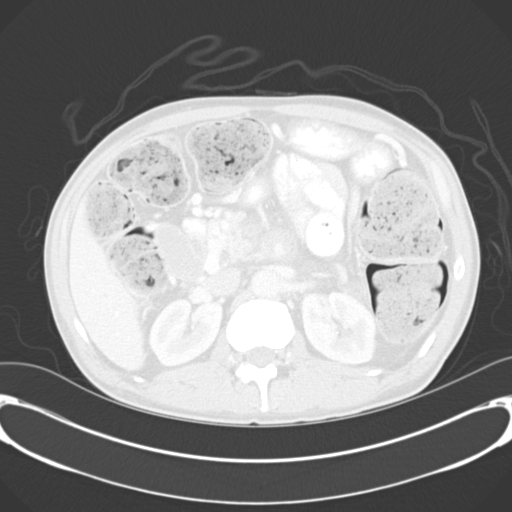
[im 122/147  soft-tissue]
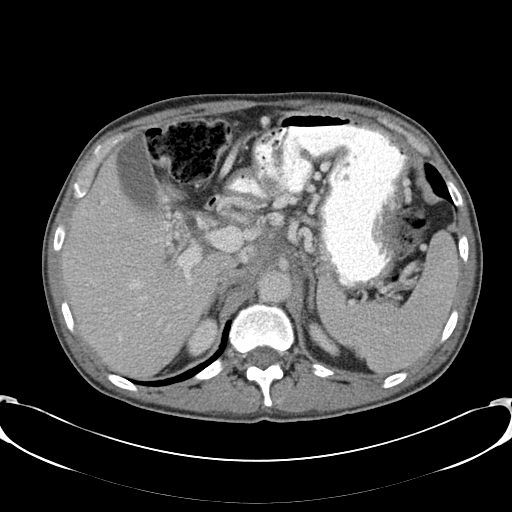
[im 122/147  lung]
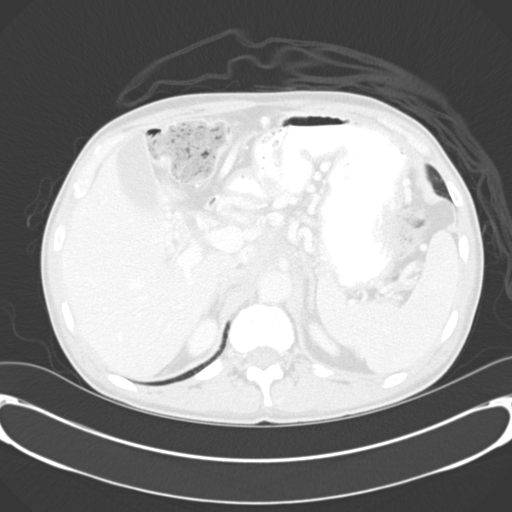
[im 134/147  soft-tissue]
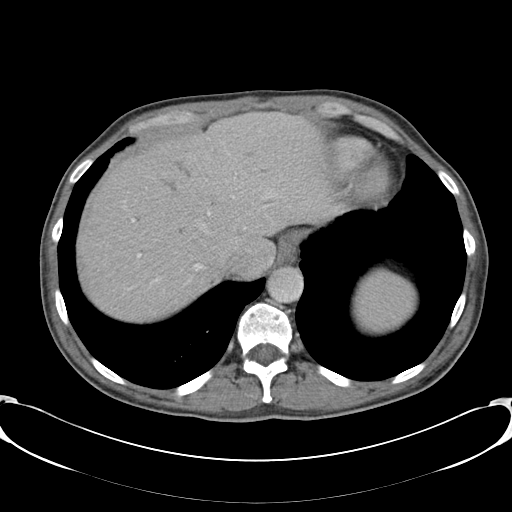
[im 134/147  lung]
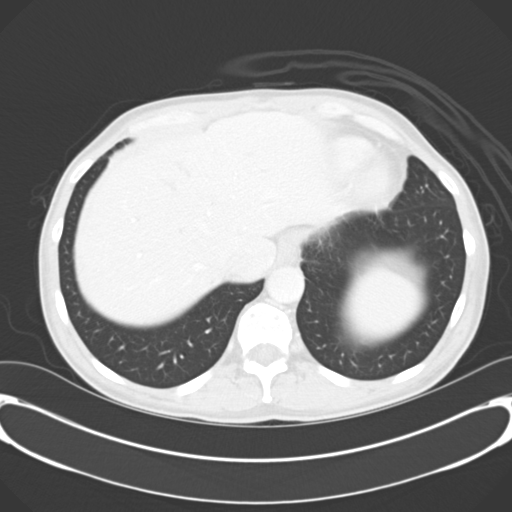
[im 134/147  bone]
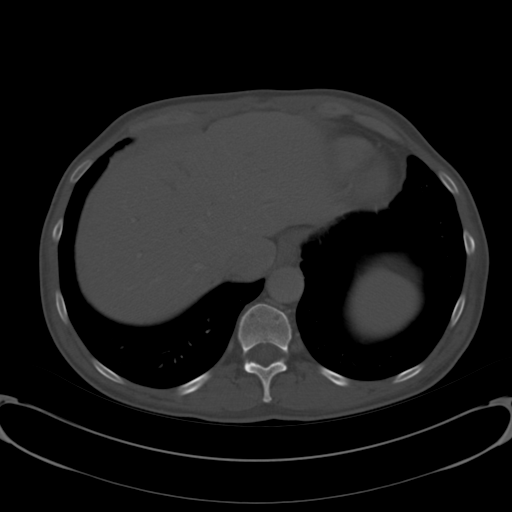

[Series 602: <mpr thick range> · coronal · 0.74mm/px · 2 of 86 slices shown]
[im 29/86  soft-tissue]
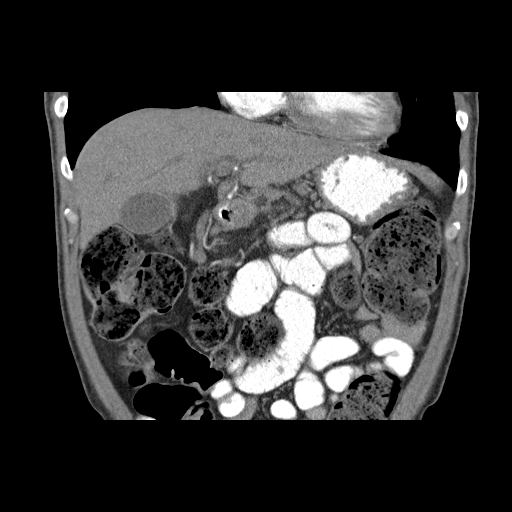
[im 57/86  soft-tissue]
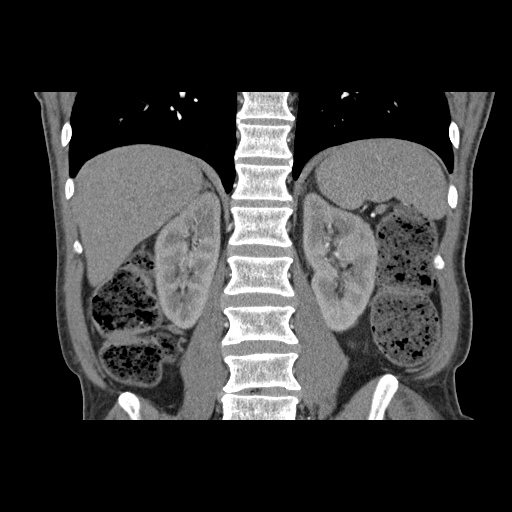

[12 of 46 positions shown; findings below may reference images not displayed]

FINDINGS: Lower chest: Clear lung bases. Normal heart size without pericardial
or pleural effusion.

Hepatobiliary: 7 mm high left hepatic lobe cyst. A pericholecystic
right liver lobe 7 mm cyst on image 32. Normal gallbladder. Stable
mild intrahepatic biliary ductal dilatation. The common duct
measures 8 mm in the porta hepatis versus 7 mm on the prior. No
obstructive stone identified.

Pancreas: Pancreatic atrophy throughout the body and tail. Similar
borderline pancreatic duct dilatation. No dominant pancreatic mass
identified. Increase in retropancreatic soft tissue infiltration at
the level of the celiac axis and superior mesenteric artery. Its
most confluent portion measures on the order of 3.7 x 3.5 cm on
image 31 of series 6. At the same level on the prior exam, measured
2.1 x 1.9 cm (when remeasured). Increased soft tissue anterior to
the superior mesenteric artery on image 38 of series 6 compared to
image 32 of series 2 on the prior exam. Example also on sagittal
image 60 of series 607. extension of amorphous abnormal soft tissue
into the porta hepatis. Example image 27 of series 6 today, new from
image 23 of series 2 on the prior.

No acute pancreatitis identified.

Spleen: Normal

Adrenals/Urinary Tract: Normal adrenal glands. Normal kidneys,
without hydronephrosis. Normal urinary bladder.

Stomach/Bowel: Normal stomach, without wall thickening. Colonic
stool burden suggests constipation. Normal terminal ileum and
appendix. Normal small bowel.

Vascular/Lymphatic: Aorta and branch vessels otherwise unremarkable
with atherosclerosis within. The splenoportal confluence is again
chronically thrombosed with resultant collaterals about the
pancreatic head and within the gastroepiploic distribution. No acute
thrombus identified. No separate well-defined retroperitoneal
adenopathy. No pelvic adenopathy.

Reproductive: Normal prostate.

Other: No significant free fluid. No evidence of omental or
peritoneal disease.

Musculoskeletal: Proximal right femoral fixation.
IMPRESSION: 1. Although a well-defined pancreatic primary is not identified,
locally advanced disease extending posteriorly is progressive. This
surrounds the celiac, superior mesenteric artery, and extends
medially toward the porta hepatis.
2. The 9 mm hepatic dome low-density lesion described on the prior
exam is not readily apparent. This is indeterminate but could
represent response to therapy of hepatic metastasis.
3. Similar mild intrahepatic and borderline common duct dilatation,
without obstructive cause identified.
4.  Possible constipation.
5. Similar chronic thrombosis of the splenoportal confluence with
resultant collateral vessels.
